# Patient Record
Sex: Female | Born: 1970
Health system: Southern US, Community
[De-identification: ages and names within clinical notes are randomized; demographics above are authoritative.]

## PROBLEM LIST (undated history)

## (undated) DIAGNOSIS — F329 Major depressive disorder, single episode, unspecified: Secondary | ICD-10-CM

## (undated) DIAGNOSIS — E119 Type 2 diabetes mellitus without complications: Secondary | ICD-10-CM

## (undated) DIAGNOSIS — E669 Obesity, unspecified: Secondary | ICD-10-CM

## (undated) DIAGNOSIS — M545 Low back pain, unspecified: Secondary | ICD-10-CM

## (undated) DIAGNOSIS — A64 Unspecified sexually transmitted disease: Secondary | ICD-10-CM

## (undated) DIAGNOSIS — K219 Gastro-esophageal reflux disease without esophagitis: Secondary | ICD-10-CM

## (undated) DIAGNOSIS — Z8619 Personal history of other infectious and parasitic diseases: Secondary | ICD-10-CM

## (undated) DIAGNOSIS — F32A Depression, unspecified: Secondary | ICD-10-CM

## (undated) DIAGNOSIS — E1169 Type 2 diabetes mellitus with other specified complication: Secondary | ICD-10-CM

## (undated) DIAGNOSIS — M1990 Unspecified osteoarthritis, unspecified site: Secondary | ICD-10-CM

## (undated) DIAGNOSIS — T7840XA Allergy, unspecified, initial encounter: Secondary | ICD-10-CM

## (undated) DIAGNOSIS — E785 Hyperlipidemia, unspecified: Secondary | ICD-10-CM

## (undated) DIAGNOSIS — F418 Other specified anxiety disorders: Secondary | ICD-10-CM

## (undated) DIAGNOSIS — G43909 Migraine, unspecified, not intractable, without status migrainosus: Secondary | ICD-10-CM

## (undated) DIAGNOSIS — Z124 Encounter for screening for malignant neoplasm of cervix: Secondary | ICD-10-CM

## (undated) DIAGNOSIS — Q2112 Patent foramen ovale: Secondary | ICD-10-CM

## (undated) HISTORY — DX: Gastro-esophageal reflux disease without esophagitis: K21.9

## (undated) HISTORY — DX: Other specified anxiety disorders: F41.8

## (undated) HISTORY — PX: AUGMENTATION MAMMAPLASTY: SUR837

## (undated) HISTORY — DX: Unspecified sexually transmitted disease: A64

## (undated) HISTORY — DX: Personal history of other infectious and parasitic diseases: Z86.19

## (undated) HISTORY — DX: Patent foramen ovale: Q21.12

## (undated) HISTORY — DX: Major depressive disorder, single episode, unspecified: F32.9

## (undated) HISTORY — DX: Migraine, unspecified, not intractable, without status migrainosus: G43.909

## (undated) HISTORY — DX: Encounter for screening for malignant neoplasm of cervix: Z12.4

## (undated) HISTORY — DX: Allergy, unspecified, initial encounter: T78.40XA

## (undated) HISTORY — DX: Low back pain, unspecified: M54.50

## (undated) HISTORY — DX: Type 2 diabetes mellitus with other specified complication: E66.9

## (undated) HISTORY — DX: Unspecified osteoarthritis, unspecified site: M19.90

## (undated) HISTORY — DX: Low back pain: M54.5

## (undated) HISTORY — DX: Type 2 diabetes mellitus without complications: E11.9

## (undated) HISTORY — DX: Type 2 diabetes mellitus with other specified complication: E11.69

## (undated) HISTORY — DX: Hyperlipidemia, unspecified: E78.5

## (undated) HISTORY — DX: Depression, unspecified: F32.A

## (undated) HISTORY — PX: COSMETIC SURGERY: SHX468

---

## 2003-12-10 HISTORY — PX: LAPAROSCOPIC GASTRIC BANDING: SHX1100

## 2010-12-09 HISTORY — PX: ABDOMINOPLASTY: SUR9

## 2010-12-09 HISTORY — PX: MASTOPEXY: SUR857

## 2012-12-09 HISTORY — PX: AUGMENTATION MAMMAPLASTY: SUR837

## 2013-02-08 LAB — HM COLONOSCOPY

## 2014-12-09 HISTORY — PX: BLADDER SUSPENSION: SHX72

## 2014-12-09 HISTORY — PX: INCONTINENCE SURGERY: SHX676

## 2016-09-17 ENCOUNTER — Emergency Department (HOSPITAL_COMMUNITY): Payer: Self-pay

## 2016-09-17 ENCOUNTER — Emergency Department (HOSPITAL_COMMUNITY)
Admission: EM | Admit: 2016-09-17 | Discharge: 2016-09-18 | Disposition: A | Discharge status: Home / Self Care | Payer: Self-pay | Attending: Emergency Medicine | Admitting: Emergency Medicine

## 2016-09-17 ENCOUNTER — Encounter (HOSPITAL_COMMUNITY): Payer: Self-pay | Admitting: *Deleted

## 2016-09-17 DIAGNOSIS — T63001A Toxic effect of unspecified snake venom, accidental (unintentional), initial encounter: Secondary | ICD-10-CM | POA: Insufficient documentation

## 2016-09-17 DIAGNOSIS — S91332A Puncture wound without foreign body, left foot, initial encounter: Secondary | ICD-10-CM

## 2016-09-17 DIAGNOSIS — E119 Type 2 diabetes mellitus without complications: Secondary | ICD-10-CM | POA: Insufficient documentation

## 2016-09-17 DIAGNOSIS — S91352A Open bite, left foot, initial encounter: Secondary | ICD-10-CM | POA: Diagnosis not present

## 2016-09-17 DIAGNOSIS — W5911XA Bitten by nonvenomous snake, initial encounter: Secondary | ICD-10-CM

## 2016-09-17 HISTORY — DX: Type 2 diabetes mellitus without complications: E11.9

## 2016-09-17 LAB — URINALYSIS, ROUTINE W REFLEX MICROSCOPIC
Bilirubin Urine: NEGATIVE
GLUCOSE, UA: NEGATIVE mg/dL
Ketones, ur: NEGATIVE mg/dL
NITRITE: NEGATIVE
PH: 5.5 (ref 5.0–8.0)
Protein, ur: NEGATIVE mg/dL
SPECIFIC GRAVITY, URINE: 1.011 (ref 1.005–1.030)

## 2016-09-17 LAB — CBC WITH DIFFERENTIAL/PLATELET
BASOS ABS: 0 10*3/uL (ref 0.0–0.1)
BASOS PCT: 0 %
Eosinophils Absolute: 0.2 10*3/uL (ref 0.0–0.7)
Eosinophils Relative: 2 %
HEMATOCRIT: 38.9 % (ref 36.0–46.0)
HEMOGLOBIN: 13.4 g/dL (ref 12.0–15.0)
LYMPHS PCT: 32 %
Lymphs Abs: 3.3 10*3/uL (ref 0.7–4.0)
MCH: 32.1 pg (ref 26.0–34.0)
MCHC: 34.4 g/dL (ref 30.0–36.0)
MCV: 93.3 fL (ref 78.0–100.0)
MONO ABS: 0.7 10*3/uL (ref 0.1–1.0)
Monocytes Relative: 7 %
NEUTROS ABS: 6 10*3/uL (ref 1.7–7.7)
NEUTROS PCT: 59 %
Platelets: 260 10*3/uL (ref 150–400)
RBC: 4.17 MIL/uL (ref 3.87–5.11)
RDW: 13.3 % (ref 11.5–15.5)
WBC: 10.1 10*3/uL (ref 4.0–10.5)

## 2016-09-17 LAB — BASIC METABOLIC PANEL
ANION GAP: 5 (ref 5–15)
BUN: 14 mg/dL (ref 6–20)
CO2: 26 mmol/L (ref 22–32)
Calcium: 9.5 mg/dL (ref 8.9–10.3)
Chloride: 107 mmol/L (ref 101–111)
Creatinine, Ser: 0.98 mg/dL (ref 0.44–1.00)
GLUCOSE: 112 mg/dL — AB (ref 65–99)
POTASSIUM: 3.9 mmol/L (ref 3.5–5.1)
Sodium: 138 mmol/L (ref 135–145)

## 2016-09-17 LAB — URINE MICROSCOPIC-ADD ON

## 2016-09-17 LAB — APTT: aPTT: 27 seconds (ref 24–36)

## 2016-09-17 LAB — PROTIME-INR
INR: 1.04
Prothrombin Time: 13.7 seconds (ref 11.4–15.2)

## 2016-09-17 LAB — FIBRINOGEN: FIBRINOGEN: 350 mg/dL (ref 210–475)

## 2016-09-17 LAB — CK: CK TOTAL: 131 U/L (ref 38–234)

## 2016-09-17 MED ORDER — IBUPROFEN 400 MG PO TABS
600.0000 mg | ORAL_TABLET | Freq: Once | ORAL | Status: AC
  Administered 2016-09-17: 600 mg via ORAL
  Filled 2016-09-17: qty 1

## 2016-09-17 NOTE — ED Provider Notes (Signed)
[facility]-EM[facility] DEPT Provider Note   CSN: [id number] Arrival date & time: 09/17/16  2008     History   Chief Complaint Chief Complaint  Patient presents with  . Puncture Wound    HPI Amanda James is a 45 y.o. female who presents with a reported snake bite. PMH significant for DM on Victoza. She states that she was stepping out of her car at ~7:30 PM this evening when she felt pain in her left foot. She states she looked down and saw a snake. She took a picture of it and thought it looked like a copperhead. She decided to come to the ED for further evaluation. Denies dizziness, chest pain, SOB, abdominal pain, N/V, myalgias, paresthesias. The pain on the foot is mild. She is ambulatory. UTD on tetanus.  HPI  Past Medical History:  Diagnosis Date  . Diabetes mellitus without complication (HCC)     There are no active problems to display for this patient.   History reviewed. No pertinent surgical history.  OB History    No data available       Home Medications    Prior to Admission medications   Not on File    Family History No family history on file.  Social History Social History  Substance Use Topics  . Smoking status: Never Smoker  . Smokeless tobacco: Never Used  . Alcohol use Yes     Allergies   Erythromycin   Review of Systems Review of Systems  Respiratory: Negative for shortness of breath.   Cardiovascular: Negative for chest pain.  Gastrointestinal: Negative for abdominal pain, nausea and vomiting.  Musculoskeletal: Negative for myalgias.  Skin: Positive for wound.  Neurological: Negative for numbness.  All other systems reviewed and are negative.    Physical Exam Updated Vital Signs BP 145/95 (BP Location: Right Arm)   Pulse 96   Temp 99.7 F (37.6 C) (Oral)   Resp 18   Ht 5\' 6"  (1.676 m)   Wt 94.4 kg   SpO2 96%   BMI 33.58 kg/m   Physical Exam  Constitutional: She is oriented to person, place, and time. She appears  well-developed and well-nourished. No distress.  HENT:  Head: Normocephalic and atraumatic.  Eyes: Conjunctivae are normal. Pupils are equal, round, and reactive to light. Right eye exhibits no discharge. Left eye exhibits no discharge. No scleral icterus.  Neck: Normal range of motion. Neck supple.  Cardiovascular: Normal rate and regular rhythm.  Exam reveals no gallop and no friction rub.   No murmur heard. Pulmonary/Chest: Effort normal and breath sounds normal. No respiratory distress. She has no wheezes. She has no rales. She exhibits no tenderness.  Abdominal: Soft. She exhibits no distension. There is no tenderness.  Musculoskeletal: She exhibits no edema.  Neurological: She is alert and oriented to person, place, and time.  Skin: Skin is warm and dry.  One small pinpoint bite mark on top of left foot with mild redness. No swelling  Psychiatric: She has a normal mood and affect. Her behavior is normal.  Nursing note and vitals reviewed.    ED Treatments / Results  Labs (all labs ordered are listed, but only abnormal results are displayed) Labs Reviewed  BASIC METABOLIC PANEL - Abnormal; Notable for the following:       Result Value   Glucose, Bld 112 (*)    All other components within normal limits  URINALYSIS, ROUTINE W REFLEX MICROSCOPIC (NOT AT [facility]) - Abnormal; Notable for the following:  Hgb urine dipstick TRACE (*)    Leukocytes, UA MODERATE (*)    All other components within normal limits  URINE MICROSCOPIC-ADD ON - Abnormal; Notable for the following:    Squamous Epithelial / LPF 0-5 (*)    Bacteria, UA FEW (*)    All other components within normal limits  CBC WITH DIFFERENTIAL/PLATELET  PROTIME-INR  FIBRINOGEN  CK  APTT    EKG  EKG Interpretation None       Radiology No results found.  Procedures Procedures (including critical care time)  Medications Ordered in ED Medications - No data to display   Initial Impression / Assessment and Plan /  ED Course  I have reviewed the triage vital signs and the nursing notes.  Pertinent labs & imaging results that were available during my care of the patient were reviewed by me and considered in my medical decision making (see chart for details).  Clinical Course   45 year old female presents with snake bite. Patient is afebrile, not tachycardic or tachypneic, and not hypoxic. Hypertensive. She is in NAD, ambulatory, well-appearing. Labs are unremarkable. Ibuprofen given for pain. Recommend no tourniquet or ice. Keep foot elevated.  Poison control consulted who recommends observation for 6 hours. Patient signed out to [facility].  Final Clinical Impressions(s) / ED Diagnoses   Final diagnoses:  Snake bite, initial encounter  Puncture wound of left foot, initial encounter    New Prescriptions New Prescriptions   No medications on file     Bethel BornKelly Marie Clarity Ciszek, PA-C 09/17/16 2242    Mancel BaleElliott Wentz, MD 09/19/16 1255

## 2016-09-17 NOTE — ED Triage Notes (Signed)
The pt is c/o of being bitten by a snake  To the lt foot just below the great toe one puncture wound no active bleeding  Minimal pain  She took a picture of it  With her  No swelling

## 2016-09-18 NOTE — ED Provider Notes (Signed)
THIS IS A SHARED VISIT WITH KELLY GEKAS, PAC.  Amanda James is a 45 y.o. female who is here tonight after being bit by a snake. She has been evaluated and per poison control need to be observed for 6 hours. She will be ready for d/c at 1:30 am if she continues to have no problems.  BP 145/95 (BP Location: Right Arm)   Pulse 96   Temp 99.7 F (37.6 C) (Oral)   Resp 18   Ht 5\' 6"  (1.676 m)   Wt 94.4 kg   LMP 09/09/2016   SpO2 96%   BMI 33.58 kg/m    Patient resting without problems.   @ 0130 patient reports feeling fine and ready to go home. I discussed lab results and f/u. Patient will return for any problems.    [address] [place] [address] [place] [name], NP 09/18/16 0132    Mancel BaleElliott Wentz, MD 09/19/16 1255

## 2016-10-01 ENCOUNTER — Ambulatory Visit: Payer: Self-pay | Admitting: Dietician

## 2016-10-04 ENCOUNTER — Encounter: Payer: Self-pay | Admitting: *Deleted

## 2016-10-04 ENCOUNTER — Other Ambulatory Visit: Payer: Self-pay | Admitting: *Deleted

## 2016-10-04 VITALS — BP 100/78 | Ht 66.0 in | Wt 206.4 lb

## 2016-10-04 DIAGNOSIS — E669 Obesity, unspecified: Secondary | ICD-10-CM

## 2016-10-04 DIAGNOSIS — E119 Type 2 diabetes mellitus without complications: Secondary | ICD-10-CM

## 2016-10-04 DIAGNOSIS — Z6833 Body mass index (BMI) 33.0-33.9, adult: Principal | ICD-10-CM

## 2016-10-04 NOTE — Patient Outreach (Addendum)
[facility] Care Management   10/04/2016  Amanda James 12-28-1970 [id number]  Amanda James is an 45 y.o. female who [facility] the [facility] office to enroll in the Link To Wellness program for self management assistance with Type II DM and hyperlipidemia.  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 [place], she had practiced in [place] previously.  She says she will be practicing in [place]ric [place] [facility] [place] [facility] [place] and will start seeing patients in December. She says she was diagnosed with Type II DM in 2003 and hyperlipidemia in 2014. She says she was taken off Metformin because her Hgb A1C was 4.9% when she was taking both Metformin and Victoza. She reports her last Hgb A1C done in June 2017 was 5.9% and she likes to keep her Hgb A1C around 5.5%. Says she has not required an endocrinologist to assist with diabetes management.   Objective:   Review of Systems  Constitutional: Negative.     Physical Exam  Constitutional: She is oriented to person, place, and time. She appears well-developed and well-nourished.  Respiratory: Effort normal.  Neurological: She is alert and oriented to person, place, and time.  Skin: Skin is warm and dry.  Psychiatric: She has a normal mood and affect. Her behavior is normal. Judgment and thought content normal.   Vitals:   10/04/16 0844  BP: 100/78   Filed Weights   10/04/16 0844  Weight: 206 lb 6.4 oz (93.6 kg)    Encounter Medications:   Outpatient Encounter Prescriptions as of 10/04/2016  Medication Sig  . atorvastatin (LIPITOR) 10 MG tablet Take 10 mg by mouth daily.  . Cetirizine HCl (ZYRTEC ALLERGY) 10 MG CAPS Take 1 capsule by mouth daily.  [name]. escitalopram (LEXAPRO) 20 MG tablet Take 20 mg by mouth daily.  .  fluticasone (FLONASE) 50 MCG/ACT nasal spray Place 1 spray into both nostrils 2 (two) times daily.  [name]. liraglutide (VICTOZA) 18 MG/3ML SOPN Inject 1.8 mg into the skin daily.  . Naltrexone-Bupropion HCl ER (CONTRAVE) 8-90 MG TB12 Take 1 tablet by mouth daily.   No facility-administered encounter medications on file as of 10/04/2016.     Functional Status:   In your present state of health, do you have any difficulty performing the following activities: 10/04/2016  Hearing? N  Vision? N  Walking or climbing stairs? N  Dressing or bathing? N  Preparing Food and eating ? N  Using the Toilet? N  In the past six months, have you accidently leaked urine? N  Do you have problems with loss of bowel control? N  Managing your Medications? N  Managing your Finances? N  Housekeeping or managing your Housekeeping? N    Fall/Depression Screening:    PHQ 2/9 Scores 10/04/2016  PHQ - 2 Score 0    Assessment:  [employer] employee, a physician, with Type II DM and hyperlipidemia enrolling in the Link TO Wellness program for self management assistance with chronic disease states.  Plan: [facility] CM Care Plan Problem One   Flowsheet Row Most Recent Value  Care Plan Problem One  New employee with Type II MD and hyperlipidemia enrolling in the Link To Wellness program for self management assistance for chronic disease states   Role Documenting the Problem One  Care Management Coordinator  Care Plan for Problem One  Active  [facility] Long Term Goal (31-90 days)  Ongoing good control of Type II DM and hyperlipidemia as evidenced by meeting treatment targets for lipid and diabetes,  normal lipid profile and Hgb A1C< or = to 5.5%  [facility] Long Term Goal Start Date  10/04/16  Interventions for Problem One Long Term Goal  Discussed Link to Wellness program goals, requirements and benefits, reviewed member's rights and responsibilities ,provided diabetes information packet with explanation of contents, ensured member  agreed and signed consent to participate and authorization to release and receive health information, consent, participation agreement and consent to enroll in program, assessed member's current knowledge of Type II diabetes, reviewed patient's medications and assessed medication adherence, discussed DM medication of Victoza,reinforced importance of taking all medications as prescribed, provided education on the three primary macronutrients (CHO, protein, fat) and their effect on glucose levels, reviewed the plate method to assist with adherence to a CHO controlled meal plan,reviewed approximate amount of CHOs to aim for at meals ( 30-45 gm ) and snacks (15 gms), discussed nutritional counseling benefit provided by [facility] health plan and encouraged patient to keep her appointment with the dietician on 10/11/16 to assist with dietary management of diabetes,discussed effects of physical activity on glucose levels and long-term glucose control by improving insulin sensitivity and assisting with weight management and cardiovascular health, encouraged patient to continue to exercise,  reviewed American Diabetes Association recommendations of 150 minutes of exercise per week including two sessions of resistance exercise weekly, discussed exercise opportunities offered for free by [facility] ,discussed benefits of choosing a [facility] primary care provider and provided web address so that she can choose a participating primary care provider, educated her to [facility] website and the [employer], advised that follow up for the Link To Wellness program will be provided by [employer] and gave patient brochure and requested she enroll. Provided her with this RNCM's contact information should she need it in the future.      Bary Richard RN,CCM,CDE [facility] Care Management Coordinator Link To Wellness Office Phone [phone] Office Fax [phone]

## 2016-10-10 NOTE — Addendum Note (Signed)
Addended by: Bary RichardHAUSER, Serinity Ware S on: 10/10/2016 05:02 PM   Modules accepted: Orders

## 2016-10-10 NOTE — Addendum Note (Signed)
Addended by: Bary RichardHAUSER, JANET S on: 10/10/2016 04:48 PM   Modules accepted: Orders

## 2016-10-11 ENCOUNTER — Encounter: Payer: Self-pay | Admitting: Skilled Nursing Facility1

## 2016-10-11 ENCOUNTER — Encounter: Payer: 59 | Attending: "Endocrinology | Admitting: Skilled Nursing Facility1

## 2016-10-11 DIAGNOSIS — Z713 Dietary counseling and surveillance: Secondary | ICD-10-CM | POA: Insufficient documentation

## 2016-10-11 NOTE — Progress Notes (Signed)
Diabetes Self-Management Education  Visit Type: First/Initial  Appt. Start Time: 8:00 Appt. End Time: 8:30  10/11/2016  [name], identified by name and date of birth, is a 45 y.o. female with a diagnosis of Diabetes: Type 2.   ASSESSMENT  Height 5\' 6"  (1.676 m), last menstrual period 09/15/2016. There is no height or weight on file to calculate BMI.      Diabetes Self-Management Education - 10/11/16 0800      Visit Information   Visit Type First/Initial     Initial Visit   Diabetes Type Type 2   Are you currently following a meal plan? Yes   Are you taking your medications as prescribed? Yes   Date Diagnosed 2001     Health Coping   How would you rate your overall health? Good     Psychosocial Assessment   Patient Belief/Attitude about Diabetes Motivated to manage diabetes     Pre-Education Assessment   Patient understands the diabetes disease and treatment process. Demonstrates understanding / competency   Patient understands incorporating nutritional management into lifestyle. Demonstrates understanding / competency   Patient undertands incorporating physical activity into lifestyle. Demonstrates understanding / competency   Patient understands using medications safely. Demonstrates understanding / competency   Patient understands monitoring blood glucose, interpreting and using results Demonstrates understanding / competency   Patient understands prevention, detection, and treatment of acute complications. Demonstrates understanding / competency   Patient understands prevention, detection, and treatment of chronic complications. Demonstrates understanding / competency   Patient understands how to develop strategies to address psychosocial issues. Demonstrates understanding / competency   Patient understands how to develop strategies to promote health/change behavior. Demonstrates understanding / competency     Complications   Last HgB A1C per patient/outside  source 5.9 %   How often do you check your blood sugar? --  1-2 times a week   Fasting Blood glucose range (mg/dL) [phone]   Have you had a dilated eye exam in the past 12 months? No   Have you had a dental exam in the past 12 months? Yes   Are you checking your feet? Yes   How many days per week are you checking your feet? 5     Exercise   Exercise Type Light (walking / raking leaves)   How many days per week to you exercise? 3   How many minutes per day do you exercise? 45   Total minutes per week of exercise 135     Patient Education   Previous Diabetes Education Yes (please comment)     Post-Education Assessment   Patient understands the diabetes disease and treatment process. Demonstrates understanding / competency   Patient understands incorporating nutritional management into lifestyle. Demonstrates understanding / competency   Patient undertands incorporating physical activity into lifestyle. Demonstrates understanding / competency   Patient understands using medications safely. Demonstrates understanding / competency   Patient understands monitoring blood glucose, interpreting and using results Demonstrates understanding / competency   Patient understands prevention, detection, and treatment of acute complications. Demonstrates understanding / competency   Patient understands prevention, detection, and treatment of chronic complications. Demonstrates understanding / competency   Patient understands how to develop strategies to address psychosocial issues. Demonstrates understanding / competency   Patient understands how to develop strategies to promote health/change behavior. Demonstrates understanding / competency     Outcomes   Expected Outcomes Demonstrated interest in learning. Expect positive outcomes   Future DMSE PRN   Program Status Completed  Individualized Plan for Diabetes Self-Management Training:   Learning Objective:  Patient will have a greater  understanding of diabetes self-management. Patient education plan is to attend individual and/or group sessions per assessed needs and concerns.   Plan:   There are no Patient Instructions on file for this visit.  Expected Outcomes:  Demonstrated interest in learning. Expect positive outcomes   If problems or questions, patient to contact team via:  Phone  Future DSME appointment: PRN

## 2016-10-16 ENCOUNTER — Telehealth: Payer: Self-pay | Admitting: *Deleted

## 2016-10-16 NOTE — Telephone Encounter (Signed)
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 I wanted to know if you wanted me to try to fit her in some where (lol) she left her number for you to call her [phone] but I will take care of it for you if you let me know what to do

## 2016-10-24 ENCOUNTER — Telehealth: Payer: Self-pay

## 2016-10-24 NOTE — Telephone Encounter (Signed)
Left message for patient to return call regarding pre- visit information.  

## 2016-10-25 ENCOUNTER — Encounter: Payer: Self-pay | Admitting: Family Medicine

## 2016-10-25 ENCOUNTER — Ambulatory Visit: Payer: 59 | Admitting: Family Medicine

## 2016-10-25 ENCOUNTER — Ambulatory Visit (INDEPENDENT_AMBULATORY_CARE_PROVIDER_SITE_OTHER): Payer: 59 | Admitting: Family Medicine

## 2016-10-25 VITALS — BP 104/68 | HR 79 | Temp 98.2°F | Ht 66.0 in | Wt 209.0 lb

## 2016-10-25 DIAGNOSIS — E785 Hyperlipidemia, unspecified: Secondary | ICD-10-CM | POA: Insufficient documentation

## 2016-10-25 DIAGNOSIS — F419 Anxiety disorder, unspecified: Secondary | ICD-10-CM | POA: Insufficient documentation

## 2016-10-25 DIAGNOSIS — F418 Other specified anxiety disorders: Secondary | ICD-10-CM

## 2016-10-25 DIAGNOSIS — Z1231 Encounter for screening mammogram for malignant neoplasm of breast: Secondary | ICD-10-CM

## 2016-10-25 DIAGNOSIS — E669 Obesity, unspecified: Secondary | ICD-10-CM | POA: Insufficient documentation

## 2016-10-25 DIAGNOSIS — T7840XA Allergy, unspecified, initial encounter: Secondary | ICD-10-CM | POA: Diagnosis not present

## 2016-10-25 DIAGNOSIS — E6609 Other obesity due to excess calories: Secondary | ICD-10-CM

## 2016-10-25 DIAGNOSIS — Z0000 Encounter for general adult medical examination without abnormal findings: Secondary | ICD-10-CM

## 2016-10-25 DIAGNOSIS — E119 Type 2 diabetes mellitus without complications: Secondary | ICD-10-CM

## 2016-10-25 DIAGNOSIS — E782 Mixed hyperlipidemia: Secondary | ICD-10-CM

## 2016-10-25 DIAGNOSIS — E559 Vitamin D deficiency, unspecified: Secondary | ICD-10-CM

## 2016-10-25 DIAGNOSIS — F32A Depression, unspecified: Secondary | ICD-10-CM | POA: Insufficient documentation

## 2016-10-25 DIAGNOSIS — Z8619 Personal history of other infectious and parasitic diseases: Secondary | ICD-10-CM | POA: Insufficient documentation

## 2016-10-25 DIAGNOSIS — E1169 Type 2 diabetes mellitus with other specified complication: Secondary | ICD-10-CM | POA: Insufficient documentation

## 2016-10-25 DIAGNOSIS — Z1239 Encounter for other screening for malignant neoplasm of breast: Secondary | ICD-10-CM

## 2016-10-25 LAB — HEMOGLOBIN A1C
HEMOGLOBIN A1C: 5 % (ref <5.7)
Mean Plasma Glucose: 97 mg/dL

## 2016-10-25 LAB — LIPID PANEL
CHOL/HDL RATIO: 2.5 ratio (ref <5.0)
Cholesterol: 141 mg/dL (ref <200)
HDL: 56 mg/dL (ref >50)
LDL Cholesterol: 65 mg/dL (ref <100)
TRIGLYCERIDES: 99 mg/dL (ref <150)
VLDL: 20 mg/dL (ref <30)

## 2016-10-25 LAB — TSH: TSH: 3.51 m[IU]/L

## 2016-10-25 NOTE — Progress Notes (Signed)
Pre visit review using our clinic review tool, if applicable. No additional management support is needed unless otherwise documented below in the visit note. 

## 2016-10-25 NOTE — Patient Instructions (Signed)
Carbohydrate Counting for Diabetes Mellitus, Adult Carbohydrate counting is a method for keeping track of how many carbohydrates you eat. Eating carbohydrates naturally increases the amount of sugar (glucose) in the blood. Counting how many carbohydrates you eat helps keep your blood glucose within normal limits, which helps you manage your diabetes (diabetes mellitus). It is important to know how many carbohydrates you can safely have in each meal. This is different for every person. A diet and nutrition specialist (registered dietitian) can help you make a meal plan and calculate how many carbohydrates you should have at each meal and snack. Carbohydrates are found in the following foods:  Grains, such as breads and cereals.  Dried beans and soy products.  Starchy vegetables, such as potatoes, peas, and corn.  Fruit and fruit juices.  Milk and yogurt.  Sweets and snack foods, such as cake, cookies, candy, chips, and soft drinks. How do I count carbohydrates? There are two ways to count carbohydrates in food. You can use either of the methods or a combination of both. Reading "Nutrition Facts" on packaged food  The "Nutrition Facts" list is included on the labels of almost all packaged foods and beverages in the [place] It includes:  The serving size.  Information about nutrients in each serving, including the grams (g) of carbohydrate per serving. To use the "Nutrition Facts":  Decide how many servings you will have.  Multiply the number of servings by the number of carbohydrates per serving.  The resulting number is the total amount of carbohydrates that you will be having. Learning standard serving sizes of other foods  When you eat foods containing carbohydrates that are not packaged or do not include "Nutrition Facts" on the label, you need to measure the servings in order to count the amount of carbohydrates:  Measure the foods that you will eat with a food scale or measuring  cup, if needed.  Decide how many standard-size servings you will eat.  Multiply the number of servings by 15. Most carbohydrate-rich foods have about 15 g of carbohydrates per serving.  For example, if you eat 8 oz (170 g) of strawberries, you will have eaten 2 servings and 30 g of carbohydrates (2 servings x 15 g = 30 g).  For foods that have more than one food mixed, such as soups and casseroles, you must count the carbohydrates in each food that is included. The following list contains standard serving sizes of common carbohydrate-rich foods. Each of these servings has about 15 g of carbohydrates:   hamburger bun or  English muffin.   oz (15 mL) syrup.   oz (14 g) jelly.  1 slice of bread.  1 six-inch tortilla.  3 oz (85 g) cooked rice or pasta.  4 oz (113 g) cooked dried beans.  4 oz (113 g) starchy vegetable, such as peas, corn, or potatoes.  4 oz (113 g) hot cereal.  4 oz (113 g) mashed potatoes or  of a large baked potato.  4 oz (113 g) canned or frozen fruit.  4 oz (120 mL) fruit juice.  4-6 crackers.  6 chicken nuggets.  6 oz (170 g) unsweetened dry cereal.  6 oz (170 g) plain fat-free yogurt or yogurt sweetened with artificial sweeteners.  8 oz (240 mL) milk.  8 oz (170 g) fresh fruit or one small piece of fruit.  24 oz (680 g) popped popcorn. Example of carbohydrate counting Sample meal  3 oz (85 g) chicken breast.  6 oz (  170 g) brown rice.  4 oz (113 g) corn.  8 oz (240 mL) milk.  8 oz (170 g) strawberries with sugar-free whipped topping. Carbohydrate calculation 1. Identify the foods that contain carbohydrates:  Rice.  Corn.  Milk.  Strawberries. 2. Calculate how many servings you have of each food:  2 servings rice.  1 serving corn.  1 serving milk.  1 serving strawberries. 3. Multiply each number of servings by 15 g:  2 servings rice x 15 g = 30 g.  1 serving corn x 15 g = 15 g.  1 serving milk x 15 g = 15  g.  1 serving strawberries x 15 g = 15 g. 4. Add together all of the amounts to find the total grams of carbohydrates eaten:  30 g + 15 g + 15 g + 15 g = 75 g of carbohydrates total. This information is not intended to replace advice given to you by your health care provider. Make sure you discuss any questions you have with your health care provider. Document Released: 11/25/2005 Document Revised: 06/14/2016 Document Reviewed: 05/08/2016 Elsevier Interactive Patient Education  2017 Elsevier Inc.  

## 2016-10-26 LAB — VITAMIN D 25 HYDROXY (VIT D DEFICIENCY, FRACTURES): Vit D, 25-Hydroxy: 43 ng/mL (ref 30–100)

## 2016-10-28 ENCOUNTER — Encounter: Payer: Self-pay | Admitting: Family Medicine

## 2016-10-28 ENCOUNTER — Other Ambulatory Visit: Payer: Self-pay | Admitting: Family Medicine

## 2016-10-28 MED ORDER — LIRAGLUTIDE 18 MG/3ML ~~LOC~~ SOPN: 1.8000 mg | PEN_INJECTOR | Freq: Every day | SUBCUTANEOUS | 6 refills | Status: DC

## 2016-10-28 MED ORDER — ATORVASTATIN CALCIUM 10 MG PO TABS: 10.0000 mg | ORAL_TABLET | Freq: Every day | ORAL | 3 refills | Status: DC

## 2016-10-28 MED ORDER — ESCITALOPRAM OXALATE 20 MG PO TABS: 20.0000 mg | ORAL_TABLET | Freq: Every day | ORAL | 3 refills | Status: DC

## 2016-10-28 MED ORDER — NALTREXONE-BUPROPION HCL ER 8-90 MG PO TB12: ORAL_TABLET | ORAL | 6 refills | Status: DC

## 2016-10-28 MED ORDER — NALTREXONE-BUPROPION HCL ER 8-90 MG PO TB12: 1.0000 | ORAL_TABLET | Freq: Every day | ORAL | 6 refills | Status: DC

## 2016-10-28 MED FILL — ATORVASTATIN 10 MG TABLET: 10 | 90 days supply | Qty: 90 | Fill #0

## 2016-10-28 MED FILL — VICTOZA 18 MG/3 ML INJECT P: 18 | 30 days supply | Qty: 9 | Fill #0

## 2016-10-28 MED FILL — CONTRAVE ER 8-90 MG TABLET: 8-90 | 30 days supply | Qty: 120 | Fill #0

## 2016-10-29 ENCOUNTER — Other Ambulatory Visit: Payer: Self-pay | Admitting: Family Medicine

## 2016-10-29 ENCOUNTER — Telehealth: Payer: Self-pay | Admitting: Family Medicine

## 2016-10-29 ENCOUNTER — Ambulatory Visit (HOSPITAL_BASED_OUTPATIENT_CLINIC_OR_DEPARTMENT_OTHER)
Admission: RE | Admit: 2016-10-29 | Discharge: 2016-10-29 | Disposition: A | Discharge status: Home / Self Care | Payer: 59 | Source: Ambulatory Visit | Attending: Family Medicine | Admitting: Family Medicine

## 2016-10-29 DIAGNOSIS — Z1239 Encounter for other screening for malignant neoplasm of breast: Secondary | ICD-10-CM

## 2016-10-29 DIAGNOSIS — Z1231 Encounter for screening mammogram for malignant neoplasm of breast: Secondary | ICD-10-CM | POA: Insufficient documentation

## 2016-10-29 NOTE — Telephone Encounter (Signed)
PA for Contrave has been approved from 10/28/2016 through 10/28/2017 Pharmacy and patient have been informed of approval. Approval letter has been sent to scan.

## 2016-11-10 ENCOUNTER — Encounter: Payer: Self-pay | Admitting: Family Medicine

## 2016-11-10 DIAGNOSIS — E669 Obesity, unspecified: Secondary | ICD-10-CM

## 2016-11-10 DIAGNOSIS — E559 Vitamin D deficiency, unspecified: Secondary | ICD-10-CM | POA: Insufficient documentation

## 2016-11-10 DIAGNOSIS — Z0000 Encounter for general adult medical examination without abnormal findings: Secondary | ICD-10-CM | POA: Insufficient documentation

## 2016-11-10 HISTORY — DX: Obesity, unspecified: E66.9

## 2016-11-10 NOTE — Assessment & Plan Note (Signed)
WNL on labs, continue supplements.

## 2016-11-10 NOTE — Assessment & Plan Note (Signed)
S/p gastric bypass and doing well. Needs heart healthy diet, monitor po intake and increase exercise as tolerated. Needs 7-8 hours of sleep nightly..[name]

## 2016-11-10 NOTE — Assessment & Plan Note (Signed)
hgba1c acceptable, minimize simple carbs. Increase exercise as tolerated. Continue current meds 

## 2016-11-10 NOTE — Progress Notes (Signed)
Patient ID: Amanda James, female   DOB: 12/27/1970, 45 y.o.   MRN: [id number]   Subjective:    Patient ID: Amanda James, female    DOB: 1971/06/13, 45 y.o.   MRN: [id number]  Chief Complaint  Patient presents with  . Establish Care    HPI Patient is in today for new patient appointment and doing well. She has a PMH of DM 2, hyperlipidemia, obesity, allergies, depression and Vitamin D deficiency. She is due for some lab work. No recent acute illness or hospitalization. Denies CP/palp/SOB/HA/congestion/fevers/GI or GU c/o. Taking meds as prescribed. No polyuria or polydipsia.   Past Medical History:  Diagnosis Date  . Allergy   . Depression   . Depression with anxiety   . Diabetes mellitus type 2 in obese ([place])   . Diabetes mellitus without complication ([place])   . History of chicken pox   . Hyperlipidemia   . Obesity 11/10/2016    Past Surgical History:  Procedure Laterality Date  . ABDOMINOPLASTY  2012  . BLADDER SUSPENSION  2016  . LAPAROSCOPIC GASTRIC BANDING  2005   esophageal dilation. band still present but released  . MASTOPEXY  2012    Family History  Problem Relation Age of Onset  . Diabetes Mother   . Hypertension Mother   . Hyperlipidemia Mother   . Obesity Mother   . Diabetes Father   . Heart disease Father     MI  . Hyperlipidemia Father   . Hypertension Father   . Obesity Father   . Diabetes Sister   . Hyperlipidemia Sister   . Hypertension Sister   . Obesity Sister   . Obesity Maternal Grandmother   . Hypertension Maternal Grandmother   . Hyperlipidemia Maternal Grandmother   . Diabetes Maternal Grandmother   . Rosacea Maternal Grandmother   . Non-Hodgkin's lymphoma Maternal Grandmother   . Arthritis Maternal Grandmother     spinal stenosis  . Multiple myeloma Maternal Grandfather   . Hyperlipidemia Paternal Grandmother     rhabdo from statins  . Kidney disease Paternal Grandmother     Social History   Social History  . Marital status:  Married    Spouse name: N/A  . Number of children: N/A  . Years of education: N/A   Occupational History  . Not on file.   Social History Main Topics  . Smoking status: Never Smoker  . Smokeless tobacco: Never Used  . Alcohol use 3.0 oz/week    5 Glasses of wine per week     Comment: 3-5 glasses of wine per week  . Drug use: No  . Sexual activity: Not on file   Other Topics Concern  . Not on file   Social History Narrative   Works With [employer], lives with husband, no major dietary restrictions. Exercise 30 minutes 3 x a week   Seat belts routinely    Outpatient Medications Prior to Visit  Medication Sig Dispense Refill  . Cetirizine HCl (ZYRTEC ALLERGY) 10 MG CAPS Take 1 capsule by mouth daily.    . fluticasone (FLONASE) 50 MCG/ACT nasal spray Place 1 spray into both nostrils 2 (two) times daily.    [name] atorvastatin (LIPITOR) 10 MG tablet Take 10 mg by mouth daily.    [name] escitalopram (LEXAPRO) 20 MG tablet Take 20 mg by mouth daily.    [name] liraglutide (VICTOZA) 18 MG/3ML SOPN Inject 1.8 mg into the skin daily. GI upset with metformin, had hypoglycemia with glipizide, and was not well controlled  on Januvia or Bydureon)    . Naltrexone-Bupropion HCl ER (CONTRAVE) 8-90 MG TB12 Take 1 tablet by mouth daily.     No facility-administered medications prior to visit.     Allergies  Allergen Reactions  . Erythromycin Diarrhea    Review of Systems  Constitutional: Negative for chills, fever and malaise/fatigue.  HENT: Negative for congestion and hearing loss.   Eyes: Negative for discharge.  Respiratory: Negative for cough, sputum production and shortness of breath.   Cardiovascular: Negative for chest pain, palpitations and leg swelling.  Gastrointestinal: Negative for abdominal pain, blood in stool, constipation, diarrhea, heartburn, nausea and vomiting.  Genitourinary: Negative for dysuria, frequency, hematuria and urgency.  Musculoskeletal: Negative for back pain, falls and  myalgias.  Skin: Negative for rash.  Neurological: Negative for dizziness, sensory change, loss of consciousness, weakness and headaches.  Endo/Heme/Allergies: Negative for environmental allergies. Does not bruise/bleed easily.  Psychiatric/Behavioral: Negative for depression and suicidal ideas. The patient is not nervous/anxious and does not have insomnia.        Objective:    Physical Exam  Constitutional: She is oriented to person, place, and time. She appears well-developed and well-nourished. No distress.  HENT:  Head: Normocephalic and atraumatic.  Eyes: Conjunctivae are normal.  Neck: Neck supple. No thyromegaly present.  Cardiovascular: Normal rate, regular rhythm and normal heart sounds.   No murmur heard. Pulmonary/Chest: Effort normal and breath sounds normal. No respiratory distress.  Abdominal: Soft. Bowel sounds are normal. She exhibits no distension and no mass. There is no tenderness.  Musculoskeletal: She exhibits no edema.  Lymphadenopathy:    She has no cervical adenopathy.  Neurological: She is alert and oriented to person, place, and time.  Skin: Skin is warm and dry.  Psychiatric: She has a normal mood and affect. Her behavior is normal.    BP 104/68 (BP Location: Left Arm, Patient Position: Sitting, Cuff Size: Large)   Pulse 79   Temp 98.2 F (36.8 C) (Oral)   Ht '5\' 6"'$  (1.676 m)   Wt 209 lb (94.8 kg)   LMP 09/15/2016   SpO2 98%   BMI 33.73 kg/m  Wt Readings from Last 3 Encounters:  10/25/16 209 lb (94.8 kg)  10/04/16 206 lb 6.4 oz (93.6 kg)  09/17/16 208 lb 1 oz (94.4 kg)     Lab Results  Component Value Date   WBC 10.1 09/17/2016   HGB 13.4 09/17/2016   HCT 38.9 09/17/2016   PLT 260 09/17/2016   GLUCOSE 112 (H) 09/17/2016   CHOL 141 10/25/2016   TRIG 99 10/25/2016   HDL 56 10/25/2016   LDLCALC 65 10/25/2016   NA 138 09/17/2016   K 3.9 09/17/2016   CL 107 09/17/2016   CREATININE 0.98 09/17/2016   BUN 14 09/17/2016   CO2 26 09/17/2016    TSH 3.51 10/25/2016   INR 1.04 09/17/2016   HGBA1C 5.0 10/25/2016    Lab Results  Component Value Date   TSH 3.51 10/25/2016   Lab Results  Component Value Date   WBC 10.1 09/17/2016   HGB 13.4 09/17/2016   HCT 38.9 09/17/2016   MCV 93.3 09/17/2016   PLT 260 09/17/2016   Lab Results  Component Value Date   NA 138 09/17/2016   K 3.9 09/17/2016   CO2 26 09/17/2016   GLUCOSE 112 (H) 09/17/2016   BUN 14 09/17/2016   CREATININE 0.98 09/17/2016   CALCIUM 9.5 09/17/2016   ANIONGAP 5 09/17/2016   Lab Results  Component  Value Date   CHOL 141 10/25/2016   Lab Results  Component Value Date   HDL 56 10/25/2016   Lab Results  Component Value Date   LDLCALC 65 10/25/2016   Lab Results  Component Value Date   TRIG 99 10/25/2016   Lab Results  Component Value Date   CHOLHDL 2.5 10/25/2016   Lab Results  Component Value Date   HGBA1C 5.0 10/25/2016       Assessment & Plan:   Problem List Items Addressed This Visit    History of chicken pox   Allergy   Depression with anxiety    Doing well on current meds. No changes      Diabetes mellitus type 2 in obese ([facility])    hgba1c acceptable, minimize simple carbs. Increase exercise as tolerated. Continue current meds      Hyperlipidemia - Primary    Tolerating statin, encouraged heart healthy diet, avoid trans fats, minimize simple carbs and saturated fats. Increase exercise as tolerated      Relevant Orders   TSH (Completed)   Lipid panel (Completed)   Obesity    S/p gastric bypass and doing well. Needs heart healthy diet, monitor po intake and increase exercise as tolerated. Needs 7-8 hours of sleep nightly..      Vitamin D deficiency    WNL on labs, continue supplements.       Relevant Orders   VITAMIN D 25 Hydroxy (Vit-D Deficiency, Fractures) (Completed)   Preventative health care    Other Visit Diagnoses    Breast cancer screening          I am having Ms. Mittleman maintain her fluticasone and  Cetirizine HCl.  No orders of the defined types were placed in this encounter.    Penni Homans, MD

## 2016-11-10 NOTE — Assessment & Plan Note (Signed)
Tolerating statin, encouraged heart healthy diet, avoid trans fats, minimize simple carbs and saturated fats. Increase exercise as tolerated 

## 2016-11-10 NOTE — Assessment & Plan Note (Signed)
Doing well on current meds. No changes  

## 2016-11-21 MED FILL — CONTRAVE ER 8-90 MG TABLET: 8-90 | 30 days supply | Qty: 120 | Fill #1

## 2016-11-21 MED FILL — ESCITALOPRAM 20 MG TABLET: 20 | 90 days supply | Qty: 90 | Fill #0

## 2016-11-21 MED FILL — VICTOZA 18 MG/3 ML INJECT P: 18 | 30 days supply | Qty: 9 | Fill #1

## 2017-02-19 MED FILL — ESCITALOPRAM 20 MG TABLET: 20 | 90 days supply | Qty: 90 | Fill #1 | Status: TO

## 2017-02-19 MED FILL — ATORVASTATIN 10 MG TABLET: 10 | 90 days supply | Qty: 90 | Fill #1 | Status: TO

## 2017-02-19 MED FILL — VICTOZA 18 MG/3 ML INJECT P: 18 | 30 days supply | Qty: 9 | Fill #2

## 2017-02-19 MED FILL — CONTRAVE ER 8-90 MG TABLET: 8-90 | 30 days supply | Qty: 120 | Fill #2 | Status: TO

## 2017-03-02 ENCOUNTER — Telehealth: Payer: Self-pay | Admitting: Neurology

## 2017-03-02 NOTE — Telephone Encounter (Signed)
Amanda James, I would like to schedule patient for an appt 3/30 at 11am. Can you please move the 11 and 1130 earlier in the week? Thank you

## 2017-03-03 NOTE — Telephone Encounter (Signed)
Appointment times cleared and 1 hr appt scheduled for this pt on 3/30 @ 11 am per MD request.

## 2017-03-06 ENCOUNTER — Ambulatory Visit (INDEPENDENT_AMBULATORY_CARE_PROVIDER_SITE_OTHER): Payer: 59 | Admitting: Family Medicine

## 2017-03-06 VITALS — BP 106/74 | HR 84 | Temp 99.5°F | Resp 16 | Ht 66.0 in | Wt 210.2 lb

## 2017-03-06 DIAGNOSIS — M545 Low back pain, unspecified: Secondary | ICD-10-CM

## 2017-03-06 DIAGNOSIS — G8929 Other chronic pain: Secondary | ICD-10-CM

## 2017-03-06 DIAGNOSIS — S29012A Strain of muscle and tendon of back wall of thorax, initial encounter: Secondary | ICD-10-CM

## 2017-03-06 MED ORDER — CYCLOBENZAPRINE HCL 10 MG PO TABS: 10.0000 mg | ORAL_TABLET | Freq: Three times a day (TID) | ORAL | 5 refills | Status: DC | PRN

## 2017-03-06 MED FILL — CYCLOBENZAPRINE 10 MG TAB: 10 | 10 days supply | Qty: 30 | Fill #0 | Status: TO

## 2017-03-06 NOTE — Progress Notes (Signed)
Pre visit review using our clinic review tool, if applicable. No additional management support is needed unless otherwise documented below in the visit note. chronic

## 2017-03-06 NOTE — Patient Instructions (Addendum)
Let us know if you need anything.  If you do not hear anything about your referral in the next 1-2 weeks, have your office manager call our office and ask for an update.

## 2017-03-06 NOTE — Progress Notes (Signed)
Musculoskeletal Exam  Patient: Amanda James DOB: 12-02-1971  DOS: 03/06/2017  SUBJECTIVE:  Chief Complaint:   Chief Complaint  Patient presents with  . Back Pain    Pt reports pain in lower and middle of back, Low back is Chroinc     Amanda James is a 46 y.o.  female for evaluation and treatment of L upper pain. She does have a history of chronic low back pain and states it is different than this. She did have a flare of this 2 weeks ago.  Onset:  2 days ago. Sudden, she was doing her hair and felt a pull in the L upper back region.  Location: L upper back Character:  aching  Progression of issue:  has significantly improved, her office manager wanted her to be seen Associated symptoms: None Denies: CP, shortness of breath, weakness, numbness, tingling Treatment: to date has been OTC NSAIDS.   Neurovascular symptoms: no  ROS: Musculoskeletal/Extremities: +R upper back pain Neurologic: no numbness, tingling no weakness   Past Medical History:  Diagnosis Date  . Allergy   . Depression   . Depression with anxiety   . Diabetes mellitus type 2 in obese (Amanda James)   . Diabetes mellitus without complication (Amanda James)   . History of chicken pox   . Hyperlipidemia   . Obesity 11/10/2016   Past Surgical History:  Procedure Laterality Date  . ABDOMINOPLASTY  2012  . BLADDER SUSPENSION  2016  . LAPAROSCOPIC GASTRIC BANDING  2005   esophageal dilation. band still present but released  . MASTOPEXY  2012   Family History  Problem Relation Age of Onset  . Diabetes Mother   . Hypertension Mother   . Hyperlipidemia Mother   . Obesity Mother   . Diabetes Father   . Heart disease Father     MI  . Hyperlipidemia Father   . Hypertension Father   . Obesity Father   . Diabetes Sister   . Hyperlipidemia Sister   . Hypertension Sister   . Obesity Sister   . Obesity Maternal Grandmother   . Hypertension Maternal Grandmother   . Hyperlipidemia Maternal Grandmother   . Diabetes Maternal  Grandmother   . Rosacea Maternal Grandmother   . Non-Hodgkin's lymphoma Maternal Grandmother   . Arthritis Maternal Grandmother     spinal stenosis  . Multiple myeloma Maternal Grandfather   . Hyperlipidemia Paternal Grandmother     rhabdo from statins  . Kidney disease Paternal Grandmother    Current Outpatient Prescriptions  Medication Sig Dispense Refill  . atorvastatin (LIPITOR) 10 MG tablet Take 1 tablet (10 mg total) by mouth daily. 90 tablet 3  . Cetirizine HCl (ZYRTEC ALLERGY) 10 MG CAPS Take 1 capsule by mouth daily.    [name] escitalopram (LEXAPRO) 20 MG tablet Take 1 tablet (20 mg total) by mouth daily. 90 tablet 3  . fluticasone (FLONASE) 50 MCG/ACT nasal spray Place 1 spray into both nostrils 2 (two) times daily.    [name] liraglutide (VICTOZA) 18 MG/3ML SOPN Inject 0.3 mLs (1.8 mg total) into the skin daily. GI upset with metformin, had hypoglycemia with glipizide, and was not well controlled on Januvia or Bydureon) 3 mL 6  . Naltrexone-Bupropion HCl ER (CONTRAVE) 8-90 MG TB12 Take 2 by mouth twice daily 120 tablet 6   Allergies  Allergen Reactions  . Erythromycin Diarrhea   Social History   Social History  . Marital status: Married   Social History Main Topics  . Smoking status: Never Smoker  .  Smokeless tobacco: Never Used  . Alcohol use 3.0 oz/week    5 Glasses of wine per week     Comment: 3-5 glasses of wine per week  . Drug use: No   Social History Narrative   Works With [employer], lives with husband, no major dietary restrictions. Exercise 30 minutes 3 x a week   Seat belts routinely    Objective: VITAL SIGNS: BP 106/74 (BP Location: Right Arm, Patient Position: Sitting, Cuff Size: Large)   Pulse 84   Temp 99.5 F (37.5 C) (Oral)   Resp 16   Ht '5\' 6"'  (1.676 m)   Wt 210 lb 3.2 oz (95.3 kg)   SpO2 99%   BMI 33.93 kg/m  Constitutional: Well formed, well developed. No acute distress. Cardiovascular: Brisk cap refill Thorax & Lungs: No accessory muscle  use Extremities: No clubbing. No cyanosis. No edema.  Skin: Warm. Dry. No erythema. No rash.  Musculoskeletal: Upper back.   Tenderness to palpation: yes Deformity: no Ecchymosis: no Tests negative: Straight leg Neurologic: Normal sensory function. No focal deficits noted. DTR's equal and symmetry in LE's. No clonus. Psychiatric: Normal mood. Age appropriate judgment and insight. Alert & oriented x 3.    PROCEDURE NOTE After discussing the procedure and risks, including but not limited to increased pain or stiffness and rarely nausea or dizziness, verbal consent was obtained.  Pre-procedure diagnosis: Somatic dysfunction Post-procedure diagnosis: Same Procedure: OMT  Regions treated include thoracic, rhomboid: Soft tissue/HVLA/MFR with the tissue response noted to be Improved.  The patient tolerated the procedure well, and there were no complications noted.  The patient was warned of the possibility of increased pain or stiffness of up to 48 hours duration and was asked to call with any unexpected problems.   Assessment:  Strain of rhomboid muscle, initial encounter  Chronic bilateral low back pain without sciatica - Plan: cyclobenzaprine (FLEXERIL) 10 MG tablet, Ambulatory referral to Physical Therapy  Plan: Orders as above. I do not believe anything sinister is going on related to her issue at hand. It sounds like it is mainly resolved. Will reorder Flexeril to use on an as needed basis. Continue using anti-inflammatories as needed. Referral to physical therapy given for her chronic low back pain issues. Follow-up with regular PCP as needed. The patient voiced understanding and agreement to the plan.   [place], DO 03/06/17  4:49 PM

## 2017-03-07 ENCOUNTER — Ambulatory Visit: Payer: Self-pay | Admitting: Neurology

## 2017-04-04 ENCOUNTER — Ambulatory Visit (INDEPENDENT_AMBULATORY_CARE_PROVIDER_SITE_OTHER): Payer: 59 | Admitting: Neurology

## 2017-04-04 ENCOUNTER — Encounter: Payer: Self-pay | Admitting: Neurology

## 2017-04-04 DIAGNOSIS — G43709 Chronic migraine without aura, not intractable, without status migrainosus: Secondary | ICD-10-CM | POA: Diagnosis not present

## 2017-04-04 MED ORDER — SUMATRIPTAN SUCCINATE 100 MG PO TABS: 100.0000 mg | ORAL_TABLET | Freq: Once | ORAL | 12 refills | Status: DC | PRN

## 2017-04-04 MED ORDER — TOPIRAMATE ER 50 MG PO SPRINKLE CAP24: 50.0000 mg | EXTENDED_RELEASE_CAPSULE | Freq: Every day | ORAL | 11 refills | Status: DC

## 2017-04-04 NOTE — Progress Notes (Deleted)
[facility]    Provider:  Dr Jaynee Eagles Referring Provider: Mosie Lukes, MD Primary Care Physician:  Penni Homans, MD  CC:  Migraine  HPI:  Amanda James is a 46 y.o. female here as a referral from Dr. Charlett Blake for migraines. She has a past medical history of type 2 diabetes, hyperlipidemia, obesity, allergies, depression, chronic low back pain, and vitamin D deficiency. Migraines started 20 years ago. Triggers include pulling her hair back and then it is a pounding pain. Her headaches can be 2-3x a day and can be daily or last days.She can go a week or longer without a headache but always with a least half the month with headaches. No medication overuse. Triggers for her headaches can be putting her hair back. She used ot get a headache with orgasm in the past which resolved. She has TMJ. She can have morning headaches with exacerbations. Red wine can trigger. Caffeine and food does not trigger. Her headaches tend to be on the top pf the head worse on the left. Throbbing and pounding. Mild nausea. No vomiting. No aura. She has light sensitivity, sound and smell sensitivity. She has a lot of musculoskeletal neck pain worse on the left. She goes to [facility] and massages help. Within 5 minutes the migraine can be at full peak. They can be moderately severe at least on average she can work throughout the day. They can wax and wane all day long, can last 2-3 days. She has more than 15 headaches in a month and 11-12 are migrainous. Her migraines have been at at this frequency for over a year. Wearing contacts can cause pain. She snores. Morning headaches. Unknown history. She has blurry vision with the headaches. She has had swishing and heartbeat in the ear when laying down. No other focal neurologic deficits, associated symptoms, inciting events or modifiable factors.  Failed Lexpro and Topiramate. Has been on Lexapro for many years, had side effects with Topiramate  Medications:  NSAIDs help better than tylenol, flexeril (doesn't help), Topiramate, Lexapro. Never tried a triptan.   Review of Systems: Patient complains of symptoms per HPI as well as the following symptoms: no CP, no SOB. Pertinent negatives per HPI. All others negative.   Social History   Social History   Marital status: Married    Spouse name: N/A   Number of children: 1   Years of education: MD   Occupational History   [employer]    Social History Main Topics   Smoking status: Never Smoker   Smokeless tobacco: Never Used   Alcohol use 3.0 oz/week    5 Glasses of wine per week     Comment: 3-5 glasses of wine per week   Drug use: No   Sexual activity: Not on file   Other Topics Concern   Not on file   Social History Narrative   Works With [employer], lives with husband, 3 cats   No major dietary restrictions. Exercise 30 minutes 3 x a week   Seat belts routinely   Right-handed   Caffeine: 1-2 cups of coffee per day    Family History  Problem Relation Age of Onset   Diabetes Mother    Hypertension Mother    Hyperlipidemia Mother    Obesity Mother    Diabetes Father    Heart disease Father     MI   Hyperlipidemia Father    Hypertension Father    Obesity Father    Diabetes Sister  Hyperlipidemia Sister    Hypertension Sister    Obesity Sister    Obesity Maternal Grandmother    Hypertension Maternal Grandmother    Hyperlipidemia Maternal Grandmother    Diabetes Maternal Grandmother    Rosacea Maternal Grandmother    Non-Hodgkin's lymphoma Maternal Grandmother    Arthritis Maternal Grandmother     spinal stenosis   Multiple myeloma Maternal Grandfather    Hyperlipidemia Paternal Grandmother     rhabdo from statins   Kidney disease Paternal Grandmother    Migraines Neg Hx     Past Medical History:  Diagnosis Date   Allergy    Depression    Depression with anxiety    Diabetes mellitus type 2 in obese ([place])    Diabetes  mellitus without complication ([place])    History of chicken pox    Hyperlipidemia    Low back pain    Obesity 11/10/2016    Past Surgical History:  Procedure Laterality Date   ABDOMINOPLASTY  2012   BLADDER SUSPENSION  2016   LAPAROSCOPIC GASTRIC BANDING  2005   esophageal dilation. band still present but released   MASTOPEXY  2012    Current Outpatient Prescriptions  Medication Sig Dispense Refill   atorvastatin (LIPITOR) 10 MG tablet Take 1 tablet (10 mg total) by mouth daily. 90 tablet 3   Cetirizine HCl (ZYRTEC ALLERGY) 10 MG CAPS Take 1 capsule by mouth daily.     cyclobenzaprine (FLEXERIL) 10 MG tablet Take 1 tablet (10 mg total) by mouth 3 (three) times daily as needed for muscle spasms. 30 tablet 5   escitalopram (LEXAPRO) 20 MG tablet Take 1 tablet (20 mg total) by mouth daily. 90 tablet 3   fluticasone (FLONASE) 50 MCG/ACT nasal spray Place 1 spray into both nostrils 2 (two) times daily.     liraglutide (VICTOZA) 18 MG/3ML SOPN Inject 0.3 mLs (1.8 mg total) into the skin daily. GI upset with metformin, had hypoglycemia with glipizide, and was not well controlled on Januvia or Bydureon) 3 mL 6   Naltrexone-Bupropion HCl ER (CONTRAVE) 8-90 MG TB12 Take 2 by mouth twice daily 120 tablet 6   SUMAtriptan (IMITREX) 100 MG tablet Take 1 tablet (100 mg total) by mouth once as needed. May repeat in 2 hours if headache persists or recurs. 10 tablet 12   Topiramate ER (QUDEXY XR) 50 MG CS24 Take 50 mg by mouth at bedtime. 30 each 11   No current facility-administered medications for this visit.     Allergies as of 04/04/2017 - Review Complete 04/04/2017  Allergen Reaction Noted   Erythromycin Diarrhea 09/17/2016    Vitals: BP 120/81    Pulse 75    Ht '5\' 6"'  (1.676 m)    Wt 210 lb 9.6 oz (95.5 kg)    BMI 33.99 kg/m  Last Weight:  Wt Readings from Last 1 Encounters:  04/04/17 210 lb 9.6 oz (95.5 kg)   Last Height:   Ht Readings from Last 1 Encounters:  04/04/17  '5\' 6"'  (1.676 m)    Physical exam: Exam: Gen: NAD, conversant, well nourised, obese, well groomed                     CV: RRR, no MRG. No Carotid Bruits. No peripheral edema, warm, nontender Eyes: Conjunctivae clear without exudates or hemorrhage  Neuro: Detailed Neurologic Exam  Speech:    Speech is normal; fluent and spontaneous with normal comprehension.  Cognition:    The patient is oriented to  person, place, and time;     recent and remote memory intact;     language fluent;     normal attention, concentration,     fund of knowledge Cranial Nerves:    The pupils are equal, round, and reactive to light. The fundi are normal and spontaneous venous pulsations are present. Visual fields are full to finger confrontation. Extraocular movements are intact. Trigeminal sensation is intact and the muscles of mastication are normal. The face is symmetric. The palate elevates in the midline. Hearing intact. Voice is normal. Shoulder shrug is normal. The tongue has normal motion without fasciculations.   Coordination:    Normal finger to nose and heel to shin. Normal rapid alternating movements.   Gait:    Heel-toe and tandem gait are normal.   Motor Observation:    No asymmetry, no atrophy, and no involuntary movements noted. Tone:    Normal muscle tone.    Posture:    Posture is normal. normal erect    Strength:    Strength is V/V in the upper and lower limbs.      Sensation: intact to LT     Reflex Exam:  DTR's:    Deep tendon reflexes in the upper and lower extremities are normal bilaterally.   Toes:    The toes are downgoing bilaterally.   Clonus:    Clonus is absent.      Assessment/Plan:  11 46 year old female with chronic migraines, without aura  Discussed acute and preventative migraine management Recommend Botox therapy, she has failed multiple classes of medications Imitrex at onset of headache Recommend possibly retrying Topiramate but patient had  side effects, will consider trying ER formulary  Discussed the following: To prevent or relieve headaches, try the following: Cool Compress. Lie down and place a cool compress on your head.  Avoid headache triggers. If certain foods or odors seem to have triggered your migraines in the past, avoid them. A headache diary might help you identify triggers.  Include physical activity in your daily routine. Try a daily walk or other moderate aerobic exercise.  Manage stress. Find healthy ways to cope with the stressors, such as delegating tasks on your to-do list.  Practice relaxation techniques. Try deep breathing, yoga, massage and visualization.  Eat regularly. Eating regularly scheduled meals and maintaining a healthy diet might help prevent headaches. Also, drink plenty of fluids.  Follow a regular sleep schedule. Sleep deprivation might contribute to headaches Consider biofeedback. With this mind-body technique, you learn to control certain bodily functions -- such as muscle tension, heart rate and blood pressure -- to prevent headaches or reduce headache pain.    Proceed to emergency room if you experience new or worsening symptoms or symptoms do not resolve, if you have new neurologic symptoms or if headache is severe, or for any concerning symptom.    Sarina Ill, MD  [facility] [address] [place], [place] [address]  Phone [phone] Fax [phone]  A total of 60 minutes was spent face-to-face with this patient. Over half this time was spent on counseling patient on the chronic migraine diagnosis and different diagnostic and therapeutic options available.

## 2017-04-04 NOTE — Progress Notes (Signed)
Botox 100 units/vial x 2 from samples  NDC 1610-9604-54 Lot [id number] Exp DEC 2018  Diluted in 4 ml of Bacteriostatic 0.9% NaCl NDC 0981-1914-78 Lot [id number]

## 2017-04-06 ENCOUNTER — Encounter: Payer: Self-pay | Admitting: Neurology

## 2017-04-06 DIAGNOSIS — G43709 Chronic migraine without aura, not intractable, without status migrainosus: Secondary | ICD-10-CM | POA: Insufficient documentation

## 2017-04-10 ENCOUNTER — Telehealth: Payer: Self-pay | Admitting: Neurology

## 2017-04-10 NOTE — Telephone Encounter (Signed)
Pt returned Danielle's call °

## 2017-04-10 NOTE — Telephone Encounter (Signed)
I called the patient to schedule her botox injection in three months. She did not answer so I left a VM asking her to call back.

## 2017-04-13 ENCOUNTER — Telehealth: Payer: 59 | Admitting: Family

## 2017-04-13 DIAGNOSIS — J028 Acute pharyngitis due to other specified organisms: Secondary | ICD-10-CM | POA: Diagnosis not present

## 2017-04-13 DIAGNOSIS — B9689 Other specified bacterial agents as the cause of diseases classified elsewhere: Secondary | ICD-10-CM

## 2017-04-13 MED ORDER — PREDNISONE 5 MG PO TABS: 5.0000 mg | ORAL_TABLET | ORAL | 0 refills | Status: DC

## 2017-04-13 MED ORDER — BENZONATATE 100 MG PO CAPS: 100.0000 mg | ORAL_CAPSULE | Freq: Three times a day (TID) | ORAL | 0 refills | Status: DC | PRN

## 2017-04-13 MED ORDER — DOXYCYCLINE HYCLATE 100 MG PO TABS: 100.0000 mg | ORAL_TABLET | Freq: Two times a day (BID) | ORAL | 0 refills | Status: DC

## 2017-04-13 NOTE — Progress Notes (Signed)
We are sorry that you are not feeling well.  Here is how we plan to help!  Based on what you have shared with me it looks like you have upper respiratory tract inflammation that has resulted in a significant cough.  Inflammation and infection in the upper respiratory tract is commonly called bronchitis and has four common causes:  Allergies, Viral Infections, Acid Reflux and Bacterial Infections.  Allergies, viruses and acid reflux are treated by controlling symptoms or eliminating the cause. An example might be a cough caused by taking certain blood pressure medications. You stop the cough by changing the medication. Another example might be a cough caused by acid reflux. Controlling the reflux helps control the cough.  Based on your presentation I believe you most likely have A cough due to bacteria.  When patients have a fever and a productive cough with a change in color or increased sputum production, we are concerned about bacterial bronchitis.  If left untreated it can progress to pneumonia.  If your symptoms do not improve with your treatment plan it is important that you contact your provider.   I hve prescribed Doxycycline 100 mg twice a day for 7 days     In addition you may use A non-prescription cough medication called Mucinex DM: take 2 tablets every 12 hours. and A prescription cough medication called Tessalon Perles 100mg. You may take 1-2 capsules every 8 hours as needed for your cough.  Sterapred 5 mg dosepak  USE OF BRONCHODILATOR ("RESCUE") INHALERS: There is a risk from using your bronchodilator too frequently.  The risk is that over-reliance on a medication which only relaxes the muscles surrounding the breathing tubes can reduce the effectiveness of medications prescribed to reduce swelling and congestion of the tubes themselves.  Although you feel brief relief from the bronchodilator inhaler, your asthma may actually be worsening with the tubes becoming more swollen and filled with  mucus.  This can delay other crucial treatments, such as oral steroid medications. If you need to use a bronchodilator inhaler daily, several times per day, you should discuss this with your provider.  There are probably better treatments that could be used to keep your asthma under control.     HOME CARE . Only take medications as instructed by your medical team. . Complete the entire course of an antibiotic. . Drink plenty of fluids and get plenty of rest. . Avoid close contacts especially the very young and the elderly . Cover your mouth if you cough or cough into your sleeve. . Always remember to wash your hands . A steam or ultrasonic humidifier can help congestion.   GET HELP RIGHT AWAY IF: . You develop worsening fever. . You become short of breath . You cough up blood. . Your symptoms persist after you have completed your treatment plan MAKE SURE YOU   Understand these instructions.  Will watch your condition.  Will get help right away if you are not doing well or get worse.  Your e-visit answers were reviewed by a board certified advanced clinical practitioner to complete your personal care plan.  Depending on the condition, your plan could have included both over the counter or prescription medications. If there is a problem please reply  once you have received a response from your provider. Your safety is important to us.  If you have drug allergies check your prescription carefully.    You can use MyChart to ask questions about today's visit, request a non-urgent call   back, or ask for a work or school excuse for 24 hours related to this e-Visit. If it has been greater than 24 hours you will need to follow up with your provider, or enter a new e-Visit to address those concerns. You will get an e-mail in the next two days asking about your experience.  I hope that your e-visit has been valuable and will speed your recovery. Thank you for using e-visits.   

## 2017-04-14 NOTE — Telephone Encounter (Signed)
I returned the patients call and left her a VM with a time and date of apt. I asked her to call back if that apt would not work.

## 2017-04-27 ENCOUNTER — Encounter: Payer: Self-pay | Admitting: Neurology

## 2017-05-02 ENCOUNTER — Other Ambulatory Visit (HOSPITAL_COMMUNITY)
Admission: RE | Admit: 2017-05-02 | Discharge: 2017-05-02 | Disposition: A | Discharge status: Home / Self Care | Payer: 59 | Source: Ambulatory Visit | Attending: Family Medicine | Admitting: Family Medicine

## 2017-05-02 ENCOUNTER — Telehealth: Payer: Self-pay

## 2017-05-02 ENCOUNTER — Ambulatory Visit (INDEPENDENT_AMBULATORY_CARE_PROVIDER_SITE_OTHER): Payer: 59 | Admitting: Family Medicine

## 2017-05-02 ENCOUNTER — Encounter: Payer: Self-pay | Admitting: Family Medicine

## 2017-05-02 VITALS — BP 98/72 | HR 68 | Temp 98.3°F | Resp 18 | Ht 66.0 in | Wt 208.0 lb

## 2017-05-02 DIAGNOSIS — E669 Obesity, unspecified: Secondary | ICD-10-CM

## 2017-05-02 DIAGNOSIS — E559 Vitamin D deficiency, unspecified: Secondary | ICD-10-CM

## 2017-05-02 DIAGNOSIS — Z0000 Encounter for general adult medical examination without abnormal findings: Secondary | ICD-10-CM

## 2017-05-02 DIAGNOSIS — E782 Mixed hyperlipidemia: Secondary | ICD-10-CM | POA: Diagnosis not present

## 2017-05-02 DIAGNOSIS — G43909 Migraine, unspecified, not intractable, without status migrainosus: Secondary | ICD-10-CM | POA: Diagnosis not present

## 2017-05-02 DIAGNOSIS — E1169 Type 2 diabetes mellitus with other specified complication: Secondary | ICD-10-CM | POA: Diagnosis not present

## 2017-05-02 DIAGNOSIS — E6609 Other obesity due to excess calories: Secondary | ICD-10-CM | POA: Diagnosis not present

## 2017-05-02 DIAGNOSIS — Z124 Encounter for screening for malignant neoplasm of cervix: Secondary | ICD-10-CM | POA: Insufficient documentation

## 2017-05-02 HISTORY — DX: Encounter for screening for malignant neoplasm of cervix: Z12.4

## 2017-05-02 LAB — HEMOGLOBIN A1C: HEMOGLOBIN A1C: 5.3 % (ref 4.6–6.5)

## 2017-05-02 LAB — COMPREHENSIVE METABOLIC PANEL
ALK PHOS: 60 U/L (ref 39–117)
ALT: 20 U/L (ref 0–35)
AST: 17 U/L (ref 0–37)
Albumin: 4.1 g/dL (ref 3.5–5.2)
BILIRUBIN TOTAL: 1.2 mg/dL (ref 0.2–1.2)
BUN: 17 mg/dL (ref 6–23)
CALCIUM: 9.2 mg/dL (ref 8.4–10.5)
CO2: 28 meq/L (ref 19–32)
Chloride: 102 mEq/L (ref 96–112)
Creatinine, Ser: 0.82 mg/dL (ref 0.40–1.20)
GFR: 79.5 mL/min (ref >60.00)
Glucose, Bld: 79 mg/dL (ref 70–99)
Potassium: 4 mEq/L (ref 3.5–5.1)
Sodium: 136 mEq/L (ref 135–145)
Total Protein: 6.8 g/dL (ref 6.0–8.3)

## 2017-05-02 LAB — CBC
HEMATOCRIT: 38.8 % (ref 36.0–46.0)
HEMOGLOBIN: 13.1 g/dL (ref 12.0–15.0)
MCHC: 33.8 g/dL (ref 30.0–36.0)
MCV: 96.6 fl (ref 78.0–100.0)
PLATELETS: 333 10*3/uL (ref 150.0–400.0)
RBC: 4.02 Mil/uL (ref 3.87–5.11)
RDW: 13.9 % (ref 11.5–15.5)
WBC: 8.5 10*3/uL (ref 4.0–10.5)

## 2017-05-02 LAB — VITAMIN D 25 HYDROXY (VIT D DEFICIENCY, FRACTURES): VITD: 29.07 ng/mL — ABNORMAL LOW (ref 30.00–100.00)

## 2017-05-02 LAB — MICROALBUMIN / CREATININE URINE RATIO
CREATININE, URINE: 90 mg/dL (ref 20–320)
MICROALB UR: 0.2 mg/dL
Microalb Creat Ratio: 2 mcg/mg creat (ref <30)

## 2017-05-02 LAB — LIPID PANEL
CHOL/HDL RATIO: 3
Cholesterol: 132 mg/dL (ref 0–200)
HDL: 52 mg/dL (ref >39.00)
LDL Cholesterol: 49 mg/dL (ref 0–99)
NonHDL: 79.8
TRIGLYCERIDES: 152 mg/dL — AB (ref 0.0–149.0)
VLDL: 30.4 mg/dL (ref 0.0–40.0)

## 2017-05-02 LAB — TSH: TSH: 2.59 u[IU]/mL (ref 0.35–4.50)

## 2017-05-02 MED FILL — VICTOZA 18 MG/3 ML INJECT P: 18 | 30 days supply | Qty: 9 | Fill #3 | Status: TO

## 2017-05-02 NOTE — Patient Instructions (Signed)
Preventive Care 40-64 Years, Female Preventive care refers to lifestyle choices and visits with your health care provider that can promote health and wellness. What does preventive care include?  A yearly physical exam. This is also called an annual well check.  Dental exams once or twice a year.  Routine eye exams. Ask your health care provider how often you should have your eyes checked.  Personal lifestyle choices, including:  Daily care of your teeth and gums.  Regular physical activity.  Eating a healthy diet.  Avoiding tobacco and drug use.  Limiting alcohol use.  Practicing safe sex.  Taking low-dose aspirin daily starting at age 52.  Taking vitamin and mineral supplements as recommended by your health care provider. What happens during an annual well check? The services and screenings done by your health care provider during your annual well check will depend on your age, overall health, lifestyle risk factors, and family history of disease. Counseling  Your health care provider may ask you questions about your:  Alcohol use.  Tobacco use.  Drug use.  Emotional well-being.  Home and relationship well-being.  Sexual activity.  Eating habits.  Work and work Statistician.  Method of birth control.  Menstrual cycle.  Pregnancy history. Screening  You may have the following tests or measurements:  Height, weight, and BMI.  Blood pressure.  Lipid and cholesterol levels. These may be checked every 5 years, or more frequently if you are over 36 years old.  Skin check.  Lung cancer screening. You may have this screening every year starting at age 50 if you have a 30-pack-year history of smoking and currently smoke or have quit within the past 15 years.  Fecal occult blood test (FOBT) of the stool. You may have this test every year starting at age 37.  Flexible sigmoidoscopy or colonoscopy. You may have a sigmoidoscopy every 5 years or a colonoscopy  every 10 years starting at age 37.  Hepatitis C blood test.  Hepatitis B blood test.  Sexually transmitted disease (STD) testing.  Diabetes screening. This is done by checking your blood sugar (glucose) after you have not eaten for a while (fasting). You may have this done every 1-3 years.  Mammogram. This may be done every 1-2 years. Talk to your health care provider about when you should start having regular mammograms. This may depend on whether you have a family history of breast cancer.  BRCA-related cancer screening. This may be done if you have a family history of breast, ovarian, tubal, or peritoneal cancers.  Pelvic exam and Pap test. This may be done every 3 years starting at age 41. Starting at age 87, this may be done every 5 years if you have a Pap test in combination with an HPV test.  Bone density scan. This is done to screen for osteoporosis. You may have this scan if you are at high risk for osteoporosis. Discuss your test results, treatment options, and if necessary, the need for more tests with your health care provider. Vaccines  Your health care provider may recommend certain vaccines, such as:  Influenza vaccine. This is recommended every year.  Tetanus, diphtheria, and acellular pertussis (Tdap, Td) vaccine. You may need a Td booster every 10 years.  Varicella vaccine. You may need this if you have not been vaccinated.  Zoster vaccine. You may need this after age 61.  Measles, mumps, and rubella (MMR) vaccine. You may need at least one dose of MMR if you were born  in 1957 or later. You may also need a second dose.  Pneumococcal 13-valent conjugate (PCV13) vaccine. You may need this if you have certain conditions and were not previously vaccinated.  Pneumococcal polysaccharide (PPSV23) vaccine. You may need one or two doses if you smoke cigarettes or if you have certain conditions.  Meningococcal vaccine. You may need this if you have certain  conditions.  Hepatitis A vaccine. You may need this if you have certain conditions or if you travel or work in places where you may be exposed to hepatitis A.  Hepatitis B vaccine. You may need this if you have certain conditions or if you travel or work in places where you may be exposed to hepatitis B.  Haemophilus influenzae type b (Hib) vaccine. You may need this if you have certain conditions. Talk to your health care provider about which screenings and vaccines you need and how often you need them. This information is not intended to replace advice given to you by your health care provider. Make sure you discuss any questions you have with your health care provider. Document Released: 12/22/2015 Document Revised: 08/14/2016 Document Reviewed: 09/26/2015 Elsevier Interactive Patient Education  2017 [employer].

## 2017-05-02 NOTE — Progress Notes (Signed)
Subjective:  I acted as a Education administrator for Dr. Charlett Blake. [place], [place]  Patient ID: Amanda James, female    DOB: 19-Mar-1971, 46 y.o.   MRN: [id number]  Chief Complaint  Patient presents with  . Annual Exam    HPI  Patient is in today for an annual exam with gyn. Patient has no acute concerns. No recent febrile illness or acute hospitalizations. Denies CP/palp/SOB/HA/congestion/fevers/GI or GU c/o. Taking meds as prescribed. She is doing well today. She settling into her new office well and has no concerns. No recent febrile illness or hospitalizations. Denies CP/palp/SOB/HA/congestion/fevers/GI or GU c/o. Taking meds as prescribed   Patient Care Team: Mosie Lukes, MD as PCP - General (Family Medicine) Barrington Ellison, RN as [facility] Management   Past Medical History:  Diagnosis Date  . Allergy   . Cervical cancer screening 05/02/2017  . Depression   . Depression with anxiety   . Diabetes mellitus type 2 in obese ([place])   . Diabetes mellitus without complication ([place])   . History of chicken pox   . Hyperlipidemia   . Low back pain   . Migraines   . Obesity 11/10/2016    Past Surgical History:  Procedure Laterality Date  . ABDOMINOPLASTY  2012  . BLADDER SUSPENSION  2016  . LAPAROSCOPIC GASTRIC BANDING  2005   esophageal dilation. band still present but released  . MASTOPEXY  2012    Family History  Problem Relation Age of Onset  . Diabetes Mother   . Hypertension Mother   . Hyperlipidemia Mother   . Obesity Mother   . Heart disease Mother        pacer  . Diabetes Father   . Heart disease Father        MI  . Hyperlipidemia Father   . Hypertension Father   . Obesity Father   . Diabetes Sister   . Hyperlipidemia Sister   . Hypertension Sister   . Obesity Sister   . Obesity Maternal Grandmother   . Hypertension Maternal Grandmother   . Hyperlipidemia Maternal Grandmother   . Diabetes Maternal Grandmother   . Rosacea Maternal Grandmother   .  Non-Hodgkin's lymphoma Maternal Grandmother   . Arthritis Maternal Grandmother        spinal stenosis  . Multiple myeloma Maternal Grandfather   . Hyperlipidemia Paternal Grandmother        rhabdo from statins  . Kidney disease Paternal Grandmother   . Migraines Neg Hx     Social History   Social History  . Marital status: Married    Spouse name: N/A  . Number of children: 1  . Years of education: MD   Occupational History  . [employer]    Social History Main Topics  . Smoking status: Never Smoker  . Smokeless tobacco: Never Used  . Alcohol use 3.0 oz/week    5 Glasses of wine per week     Comment: 3-5 glasses of wine per week  . Drug use: No  . Sexual activity: Not on file   Other Topics Concern  . Not on file   Social History Narrative   Works With [employer], lives with husband, 3 cats   No major dietary restrictions. Exercise 30 minutes 3 x a week   Seat belts routinely   Right-handed   Caffeine: 1-2 cups of coffee per day    Outpatient Medications Prior to Visit  Medication Sig Dispense Refill  . atorvastatin (LIPITOR) 10  MG tablet Take 1 tablet (10 mg total) by mouth daily. 90 tablet 3  . Cetirizine HCl (ZYRTEC ALLERGY) 10 MG CAPS Take 1 capsule by mouth daily.    . cyclobenzaprine (FLEXERIL) 10 MG tablet Take 1 tablet (10 mg total) by mouth 3 (three) times daily as needed for muscle spasms. 30 tablet 5  . escitalopram (LEXAPRO) 20 MG tablet Take 1 tablet (20 mg total) by mouth daily. 90 tablet 3  . fluticasone (FLONASE) 50 MCG/ACT nasal spray Place 1 spray into both nostrils 2 (two) times daily.    [name] liraglutide (VICTOZA) 18 MG/3ML SOPN Inject 0.3 mLs (1.8 mg total) into the skin daily. GI upset with metformin, had hypoglycemia with glipizide, and was not well controlled on Januvia or Bydureon) 3 mL 6  . Naltrexone-Bupropion HCl ER (CONTRAVE) 8-90 MG TB12 Take 2 by mouth twice daily 120 tablet 6  . predniSONE (DELTASONE) 5 MG tablet Take 1 tablet (5 mg total) by  mouth as directed. sterapred 21 dose taper, generic 21 tablet 0  . SUMAtriptan (IMITREX) 100 MG tablet Take 1 tablet (100 mg total) by mouth once as needed. May repeat in 2 hours if headache persists or recurs. 10 tablet 12  . benzonatate (TESSALON PERLES) 100 MG capsule Take 1-2 capsules (100-200 mg total) by mouth every 8 (eight) hours as needed for cough. 30 capsule 0  . doxycycline (VIBRA-TABS) 100 MG tablet Take 1 tablet (100 mg total) by mouth 2 (two) times daily. 14 tablet 0  . Topiramate ER (QUDEXY XR) 50 MG CS24 Take 50 mg by mouth at bedtime. 30 each 11   No facility-administered medications prior to visit.     Allergies  Allergen Reactions  . Erythromycin Diarrhea    Review of Systems  Constitutional: Negative for fever and malaise/fatigue.  HENT: Negative for congestion.   Eyes: Negative for blurred vision.  Respiratory: Negative for cough and shortness of breath.   Cardiovascular: Negative for chest pain, palpitations and leg swelling.  Gastrointestinal: Negative for vomiting.  Musculoskeletal: Negative for back pain.  Skin: Negative for rash.  Neurological: Negative for loss of consciousness and headaches.       Objective:    Physical Exam  Constitutional: She is oriented to person, place, and time. She appears well-developed and well-nourished. No distress.  HENT:  Head: Normocephalic and atraumatic.  Eyes: Conjunctivae are normal.  Neck: Normal range of motion. No thyromegaly present.  Cardiovascular: Normal rate and regular rhythm.   Pulmonary/Chest: Effort normal and breath sounds normal. She has no wheezes.  Abdominal: Soft. Bowel sounds are normal. There is no tenderness.  Musculoskeletal: Normal range of motion. She exhibits no edema or deformity.  Neurological: She is alert and oriented to person, place, and time.  Skin: Skin is warm and dry. She is not diaphoretic.  Psychiatric: She has a normal mood and affect.    BP 98/72 (BP Location: Left Arm,  Patient Position: Sitting, Cuff Size: Normal)   Pulse 68   Temp 98.3 F (36.8 C) (Oral)   Resp 18   Ht '5\' 6"'  (1.676 m)   Wt 208 lb (94.3 kg)   LMP 04/25/2017   SpO2 97%   BMI 33.57 kg/m  Wt Readings from Last 3 Encounters:  05/02/17 208 lb (94.3 kg)  04/04/17 210 lb 9.6 oz (95.5 kg)  03/06/17 210 lb 3.2 oz (95.3 kg)   BP Readings from Last 3 Encounters:  05/02/17 98/72  04/04/17 120/81  03/06/17 106/74  Immunization History  Administered Date(s) Administered  . Influenza-Unspecified 09/05/2016  . Pneumococcal Conjugate-13 12/09/2013  . Pneumococcal Polysaccharide-23 12/09/2013  . Tdap 09/05/2016    Health Maintenance  Topic Date Due  . OPHTHALMOLOGY EXAM  07/27/1980  . HIV Screening  07/27/1985  . PAP SMEAR  07/28/1991  . INFLUENZA VACCINE  07/09/2017  . HEMOGLOBIN A1C  11/02/2017  . FOOT EXAM  05/02/2018  . URINE MICROALBUMIN  05/02/2018  . PNEUMOCOCCAL POLYSACCHARIDE VACCINE (2) 12/09/2018  . TETANUS/TDAP  09/05/2026    Lab Results  Component Value Date   WBC 8.5 05/02/2017   HGB 13.1 05/02/2017   HCT 38.8 05/02/2017   PLT 333.0 05/02/2017   GLUCOSE 79 05/02/2017   CHOL 132 05/02/2017   TRIG 152.0 (H) 05/02/2017   HDL 52.00 05/02/2017   LDLCALC 49 05/02/2017   ALT 20 05/02/2017   AST 17 05/02/2017   NA 136 05/02/2017   K 4.0 05/02/2017   CL 102 05/02/2017   CREATININE 0.82 05/02/2017   BUN 17 05/02/2017   CO2 28 05/02/2017   TSH 2.59 05/02/2017   INR 1.04 09/17/2016   HGBA1C 5.3 05/02/2017   MICROALBUR 0.2 05/02/2017    Lab Results  Component Value Date   TSH 2.59 05/02/2017   Lab Results  Component Value Date   WBC 8.5 05/02/2017   HGB 13.1 05/02/2017   HCT 38.8 05/02/2017   MCV 96.6 05/02/2017   PLT 333.0 05/02/2017   Lab Results  Component Value Date   NA 136 05/02/2017   K 4.0 05/02/2017   CO2 28 05/02/2017   GLUCOSE 79 05/02/2017   BUN 17 05/02/2017   CREATININE 0.82 05/02/2017   BILITOT 1.2 05/02/2017   ALKPHOS 60  05/02/2017   AST 17 05/02/2017   ALT 20 05/02/2017   PROT 6.8 05/02/2017   ALBUMIN 4.1 05/02/2017   CALCIUM 9.2 05/02/2017   ANIONGAP 5 09/17/2016   GFR 79.50 05/02/2017   Lab Results  Component Value Date   CHOL 132 05/02/2017   Lab Results  Component Value Date   HDL 52.00 05/02/2017   Lab Results  Component Value Date   LDLCALC 49 05/02/2017   Lab Results  Component Value Date   TRIG 152.0 (H) 05/02/2017   Lab Results  Component Value Date   CHOLHDL 3 05/02/2017   Lab Results  Component Value Date   HGBA1C 5.3 05/02/2017         Assessment & Plan:   Problem List Items Addressed This Visit    Diabetes mellitus type 2 in obese ([place]) - Primary    hgba1c acceptable, minimize simple carbs. Increase exercise as tolerated. Continue current meds. Improved s/p bypass. Continue to monitor      Relevant Orders   Hemoglobin A1c (Completed)   TSH (Completed)   Microalbumin / creatinine urine ratio (Completed)   Hyperlipidemia    Encouraged heart healthy diet, increase exercise, avoid trans fats, consider a krill oil cap daily      Relevant Orders   Lipid panel (Completed)   Obesity    Encouraged DASH diet, decrease po intake and increase exercise as tolerated. Needs 7-8 hours of sleep nightly. Avoid trans fats, eat small, frequent meals every 4-5 hours with lean proteins, complex carbs and healthy fats. Minimize simple carbs.       Vitamin D deficiency     take daily Vitamin D over the counter. If already taking a daily supplement increase by 1000 IU daily and if not start  Vitamin D 2000 IU daily.       Relevant Orders   Vitamin D (25 hydroxy) (Completed)   Preventative health care    Patient encouraged to maintain heart healthy diet, regular exercise, adequate sleep. Consider daily probiotics. Take medications as prescribed. Given and reviewed copy of ACP documents from [employer] and encouraged to complete and return. Labs ordered and reviewed        RESOLVED: Migraines   Relevant Orders   CBC (Completed)   Comprehensive metabolic panel (Completed)   Cervical cancer screening    Pap today, no concerns on exam. Pap not due this year.      Relevant Orders   Cytology - PAP      I have discontinued Ms. Bartolo's Topiramate ER, doxycycline, and benzonatate. I am also having her maintain her fluticasone, Cetirizine HCl, atorvastatin, escitalopram, liraglutide, Naltrexone-Bupropion HCl ER, cyclobenzaprine, SUMAtriptan, and predniSONE.  No orders of the defined types were placed in this encounter.   CMA served as Education administrator during this visit. History, Physical and Plan performed by medical provider. Documentation and orders reviewed and attested to.  Penni Homans, MD

## 2017-05-02 NOTE — Assessment & Plan Note (Signed)
Encouraged DASH diet, decrease po intake and increase exercise as tolerated. Needs 7-8 hours of sleep nightly. Avoid trans fats, eat small, frequent meals every 4-5 hours with lean proteins, complex carbs and healthy fats. Minimize simple carbs 

## 2017-05-02 NOTE — Telephone Encounter (Signed)
PA initiated via Covermymeds; KEY: [id number]. Awaiting determination.

## 2017-05-02 NOTE — Assessment & Plan Note (Addendum)
Pap today, no concerns on exam. Pap not due this year.

## 2017-05-05 NOTE — Assessment & Plan Note (Signed)
Encouraged heart healthy diet, increase exercise, avoid trans fats, consider a krill oil cap daily 

## 2017-05-05 NOTE — Assessment & Plan Note (Signed)
take daily Vitamin D over the counter. If already taking a daily supplement increase by 1000 IU daily and if not start Vitamin D 2000 IU daily.  

## 2017-05-05 NOTE — Assessment & Plan Note (Signed)
Patient encouraged to maintain heart healthy diet, regular exercise, adequate sleep. Consider daily probiotics. Take medications as prescribed. Given and reviewed copy of ACP documents from [place] Secretary of State and encouraged to complete and return. Labs ordered and reviewed.  

## 2017-05-05 NOTE — Assessment & Plan Note (Signed)
hgba1c acceptable, minimize simple carbs. Increase exercise as tolerated. Continue current meds. Improved s/p bypass. Continue to monitor

## 2017-05-06 LAB — CYTOLOGY - PAP
Bacterial vaginitis: NEGATIVE
CANDIDA VAGINITIS: NEGATIVE
Chlamydia: NEGATIVE
Diagnosis: NEGATIVE
HPV: NOT DETECTED
Neisseria Gonorrhea: NEGATIVE
TRICH (WINDOWPATH): NEGATIVE

## 2017-05-12 NOTE — Telephone Encounter (Signed)
Caller name: Tonia GhentMarriel  Relation to pt: from [employer]  Call back number: [phone]  Pharmacy:  Reason for call:  additional clinical questions needed faxing over form to [phone] or you call call and give verbal.

## 2017-05-12 NOTE — Telephone Encounter (Signed)
Form received, questions answered to best of my ability and faxed back to [employer] at [phone]. Awaiting determination.

## 2017-05-14 NOTE — Telephone Encounter (Signed)
Received PA approval through 08/13/2017.

## 2017-06-04 MED FILL — ESCITALOPRAM 20 MG TABLET: 20 | 90 days supply | Qty: 90 | Fill #0

## 2017-06-04 MED FILL — CONTRAVE ER 8-90 MG TABLET: 8-90 | 30 days supply | Qty: 120 | Fill #0

## 2017-06-04 MED FILL — ATORVASTATIN 10 MG TABLET: 10 | 90 days supply | Qty: 90 | Fill #0

## 2017-06-04 MED FILL — VICTOZA 18 MG/3 ML INJECT P: 18 | 30 days supply | Qty: 9 | Fill #0

## 2017-06-11 ENCOUNTER — Telehealth: Payer: 59 | Admitting: Physician Assistant

## 2017-06-11 DIAGNOSIS — R399 Unspecified symptoms and signs involving the genitourinary system: Secondary | ICD-10-CM | POA: Diagnosis not present

## 2017-06-11 MED ORDER — CIPROFLOXACIN HCL 500 MG PO TABS: 500.0000 mg | ORAL_TABLET | Freq: Two times a day (BID) | ORAL | 0 refills | Status: DC

## 2017-06-11 NOTE — Progress Notes (Signed)

## 2017-06-12 ENCOUNTER — Ambulatory Visit: Payer: 59 | Admitting: Neurology

## 2017-06-20 ENCOUNTER — Ambulatory Visit: Payer: Self-pay | Admitting: Neurology

## 2017-07-03 MED FILL — CONTRAVE ER 8-90 MG TABLET: 8-90 | 30 days supply | Qty: 120 | Fill #1

## 2017-07-03 MED FILL — VICTOZA 18 MG/3 ML INJECT P: 18 | 30 days supply | Qty: 9 | Fill #1

## 2017-07-28 MED FILL — CONTRAVE ER 8-90 MG TABLET: 8-90 | 30 days supply | Qty: 120 | Fill #2

## 2017-07-28 MED FILL — CYCLOBENZAPRINE 10 MG TABLE: 10 | 10 days supply | Qty: 30 | Fill #0

## 2017-07-28 MED FILL — VICTOZA 18 MG/3 ML INJECT P: 18 | 30 days supply | Qty: 9 | Fill #2

## 2017-08-03 ENCOUNTER — Encounter: Payer: Self-pay | Admitting: Family Medicine

## 2017-08-04 MED ORDER — INSULIN PEN NEEDLE 32G X 4 MM MISC: 1 refills | Status: DC

## 2017-08-28 ENCOUNTER — Encounter: Payer: Self-pay | Admitting: Family Medicine

## 2017-08-28 MED ORDER — LIRAGLUTIDE 18 MG/3ML ~~LOC~~ SOPN: 1.8000 mg | PEN_INJECTOR | Freq: Every day | SUBCUTANEOUS | 2 refills | Status: DC

## 2017-08-28 MED FILL — ESCITALOPRAM 20 MG TABLET: 20 | 90 days supply | Qty: 90 | Fill #1

## 2017-08-28 MED FILL — ATORVASTATIN 10 MG TABLET: 10 | 90 days supply | Qty: 90 | Fill #1

## 2017-08-28 MED FILL — CYCLOBENZAPRINE 10 MG TABLE: 10 | 10 days supply | Qty: 30 | Fill #1

## 2017-08-29 ENCOUNTER — Ambulatory Visit: Payer: Self-pay | Admitting: Neurology

## 2017-09-20 DIAGNOSIS — Z23 Encounter for immunization: Secondary | ICD-10-CM | POA: Diagnosis not present

## 2017-10-14 ENCOUNTER — Telehealth: Payer: Self-pay

## 2017-10-14 ENCOUNTER — Other Ambulatory Visit: Payer: Self-pay

## 2017-10-14 MED ORDER — NALTREXONE-BUPROPION HCL ER 8-90 MG PO TB12: ORAL_TABLET | ORAL | 6 refills | Status: DC

## 2017-10-14 MED FILL — CYCLOBENZAPRINE 10 MG TAB: 10 | 10 days supply | Qty: 30 | Fill #2

## 2017-10-14 MED FILL — VICTOZA 18 MG/3 ML INJECT P: 18 | 30 days supply | Qty: 9 | Fill #0

## 2017-10-14 NOTE — Telephone Encounter (Signed)
PA initiated via Covermymeds; KEY: [id number]. Awaiting determination.

## 2017-10-17 DIAGNOSIS — E119 Type 2 diabetes mellitus without complications: Secondary | ICD-10-CM | POA: Diagnosis not present

## 2017-10-17 DIAGNOSIS — H524 Presbyopia: Secondary | ICD-10-CM | POA: Diagnosis not present

## 2017-10-17 LAB — HM DIABETES EYE EXAM

## 2017-10-20 NOTE — Telephone Encounter (Signed)
PA approved-effective for a maximum of 12 refills from 10/17/2017 to 10/16/2018.

## 2017-10-28 ENCOUNTER — Encounter: Payer: Self-pay | Admitting: Family Medicine

## 2017-11-03 ENCOUNTER — Telehealth: Payer: 59 | Admitting: Family

## 2017-11-03 ENCOUNTER — Other Ambulatory Visit: Payer: Self-pay | Admitting: Family Medicine

## 2017-11-03 DIAGNOSIS — L700 Acne vulgaris: Secondary | ICD-10-CM

## 2017-11-03 MED ORDER — TRETINOIN 0.025 % EX CREA: TOPICAL_CREAM | Freq: Every day | CUTANEOUS | 0 refills | Status: DC

## 2017-11-03 MED ORDER — MINOCYCLINE HCL 50 MG PO CAPS: 100.0000 mg | ORAL_CAPSULE | Freq: Two times a day (BID) | ORAL | 0 refills | Status: DC

## 2017-11-03 NOTE — Progress Notes (Signed)
Just got off a long phone call with [facility]. They advised that this may be expensive and may require a PA and they will fax to connected care if necessary. They advised to use the cream instead of gel as I am not familiar with the topical form of this medication. Pharmacy advised 20g tube is deal and may or may not cost $73. Will send same info to patient.   Beau FannyWithrow, Ganesh Deeg C, FNP 11/03/2017 11:00 AM

## 2017-11-03 NOTE — Progress Notes (Signed)
Thank you for the details you included in the comment boxes. Those details are very helpful in determining the best course of treatment for you and help us to provide the best care.  We are sorry that you are experiencing this issue.  Here is how we plan to help!  Based on what you shared with me it looks like you have uncomplicated acne.  Acne is a disorder of the hair follicles and oil glands (sebaceous glands). The sebaceous glands secrete oils to keep the skin moist.  When the glands get clogged, it can lead to pimples or cysts.  These cysts may become infected and leave scars. Acne is very common and normally occurs at puberty.  Acne is also inherited.  Your personal care plan consists of the following recommendations:  I recommend that you use a daily cleanser  You might try an over the counter cleanser that has benzoyl peroxide.  I recommend that you start with a product that has 2.5% benzoyl peroxide.  Stronger concentrations have not been shown to be more effective.  I have prescribed a topical gel with an antibiotic:  *I cannot find the cream you are seeking. I will call the pharmacy  I have also prescribed one of the following additional therapies:  Minocycline an oral antibiotic 50 mg twice a day  If excessive dryness or peeling occurs, reduce dose frequency or concentration of the topical scrubs.  If excessive stinging or burning occurs, remove the topical gel with mild soap and water and resume at a lower dose the next day.  Remember oral antibiotics and topical acne treatments may increase your sensitivity to the sun!  HOME CARE:  Do not squeeze pimples because that can often lead to infections, worse acne, and scars.  Use a moisturizer that contains retinoid or fruit acids that may inhibit the development of new acne lesions.  Although there is not a clear link that foods can cause acne, doctors do believe that too many sweets predispose you to skin problems.  GET HELP  RIGHT AWAY IF:  If your acne gets worse or is not better within 10 days.  If you become depressed.  If you become pregnant, discontinue medications and call your OB/GYN.  MAKE SURE YOU:  Understand these instructions.  Will watch your condition.  Will get help right away if you are not doing well or get worse.   Your e-visit answers were reviewed by a board certified advanced clinical practitioner to complete your personal care plan.  Depending upon the condition, your plan could have included both over the counter or prescription medications.  Please review your pharmacy choice.  If there is a problem, you may contact your provider through [employer] messaging and have the prescription routed to another pharmacy.  Your safety is important to us.  If you have drug allergies check your prescription carefully.  For the next 24 hours you can use MyChart to ask questions about today's visit, request a non-urgent call back, or ask for a work or school excuse from your e-visit provider.  You will get an email in the next two days asking about your experience. I hope that your e-visit has been valuable and will speed your recovery.

## 2017-11-03 NOTE — Addendum Note (Signed)
Addended by: Constance HawWITHROW, Natacha Jepsen C on: 11/03/2017 11:02 AM   Modules accepted: Orders

## 2017-11-12 MED FILL — VICTOZA 18 MG/3 ML INJECT P: 18 | 30 days supply | Qty: 9 | Fill #1 | Status: TO

## 2017-11-14 ENCOUNTER — Ambulatory Visit: Payer: 59 | Admitting: Family Medicine

## 2017-11-17 ENCOUNTER — Other Ambulatory Visit: Payer: Self-pay | Admitting: *Deleted

## 2017-11-17 NOTE — Patient Outreach (Signed)
Dr. Dalbert GarnetBeasley transitioned from the Link To Wellness program to the [employer] digital assistant platform on 10/03/17 for Type II diabetes self-management assistance so will close case to the diabetes Link To Wellness program due to delegation of disease management services to [facility] from [employer] [facility] [facility] plan members in 2019. Bary RichardJanet S. Lene Mckay RN,CCM,CDE [facility] Care Management Coordinator Link To Wellness and [employer] Office Phone [phone] Office Fax [phone]

## 2017-12-12 ENCOUNTER — Ambulatory Visit: Payer: 59 | Admitting: Family Medicine

## 2017-12-12 ENCOUNTER — Encounter: Payer: Self-pay | Admitting: Family Medicine

## 2017-12-12 DIAGNOSIS — E782 Mixed hyperlipidemia: Secondary | ICD-10-CM | POA: Diagnosis not present

## 2017-12-12 DIAGNOSIS — G43709 Chronic migraine without aura, not intractable, without status migrainosus: Secondary | ICD-10-CM

## 2017-12-12 DIAGNOSIS — E6609 Other obesity due to excess calories: Secondary | ICD-10-CM | POA: Diagnosis not present

## 2017-12-12 DIAGNOSIS — E669 Obesity, unspecified: Secondary | ICD-10-CM

## 2017-12-12 DIAGNOSIS — E559 Vitamin D deficiency, unspecified: Secondary | ICD-10-CM | POA: Diagnosis not present

## 2017-12-12 DIAGNOSIS — E1169 Type 2 diabetes mellitus with other specified complication: Secondary | ICD-10-CM | POA: Diagnosis not present

## 2017-12-12 LAB — LIPID PANEL
Cholesterol: 148 mg/dL (ref 0–200)
HDL: 55 mg/dL
LDL Cholesterol: 73 mg/dL (ref 0–99)
NonHDL: 92.52
Total CHOL/HDL Ratio: 3
Triglycerides: 100 mg/dL (ref 0.0–149.0)
VLDL: 20 mg/dL (ref 0.0–40.0)

## 2017-12-12 LAB — COMPREHENSIVE METABOLIC PANEL
ALT: 24 U/L (ref 0–35)
AST: 23 U/L (ref 0–37)
Albumin: 4.2 g/dL (ref 3.5–5.2)
Alkaline Phosphatase: 60 U/L (ref 39–117)
BUN: 15 mg/dL (ref 6–23)
CHLORIDE: 102 meq/L (ref 96–112)
CO2: 27 mEq/L (ref 19–32)
Calcium: 9.4 mg/dL (ref 8.4–10.5)
Creatinine, Ser: 0.79 mg/dL (ref 0.40–1.20)
GFR: 82.78 mL/min (ref >60.00)
Glucose, Bld: 77 mg/dL (ref 70–99)
POTASSIUM: 4.6 meq/L (ref 3.5–5.1)
SODIUM: 136 meq/L (ref 135–145)
TOTAL PROTEIN: 7.1 g/dL (ref 6.0–8.3)
Total Bilirubin: 1.9 mg/dL — ABNORMAL HIGH (ref 0.2–1.2)

## 2017-12-12 LAB — VITAMIN D 25 HYDROXY (VIT D DEFICIENCY, FRACTURES): VITD: 32.71 ng/mL (ref 30.00–100.00)

## 2017-12-12 LAB — MICROALBUMIN / CREATININE URINE RATIO
Creatinine,U: 81.2 mg/dL
Microalb Creat Ratio: 0.9 mg/g (ref 0.0–30.0)
Microalb, Ur: <0.7 mg/dL (ref 0.0–1.9)

## 2017-12-12 LAB — TSH: TSH: 4.36 u[IU]/mL (ref 0.35–4.50)

## 2017-12-12 LAB — HEMOGLOBIN A1C: Hgb A1c MFr Bld: 5.3 % (ref 4.6–6.5)

## 2017-12-12 MED ORDER — NALTREXONE-BUPROPION HCL ER 8-90 MG PO TB12: ORAL_TABLET | ORAL | 6 refills | Status: DC

## 2017-12-12 MED FILL — VICTOZA 18 MG/3 ML INJECT P: 18 | 30 days supply | Qty: 9 | Fill #0 | Status: TO

## 2017-12-12 MED FILL — ATORVASTATIN 10 MG TABLET: 10 | 90 days supply | Qty: 90 | Fill #0 | Status: TO

## 2017-12-12 MED FILL — CONTRAVE ER 8-90 MG TABLET: 8-90 | 30 days supply | Qty: 120 | Fill #0

## 2017-12-12 MED FILL — TRETINOIN 0.025% CREAM: 0.025 | 10 days supply | Qty: 20 | Fill #0

## 2017-12-12 MED FILL — ESCITALOPRAM 20 MG TABLET: 20 | 90 days supply | Qty: 90 | Fill #0 | Status: TO

## 2017-12-12 NOTE — Assessment & Plan Note (Signed)
Encouraged heart healthy diet, increase exercise, avoid trans fats, consider a krill oil cap daily 

## 2017-12-12 NOTE — Assessment & Plan Note (Addendum)
Check level today, continue supplementation

## 2017-12-12 NOTE — Progress Notes (Signed)
Subjective:  I acted as a Education administrator for Dr. Charlett Blake. [place], [place]  Patient ID: Amanda James, female    DOB: 1971-05-17, 47 y.o.   MRN: [id number]  No chief complaint on file.   HPI  Patient is in today for a 6 month   Patient Care Team: Mosie Lukes, MD as PCP - General (Family Medicine)   Past Medical History:  Diagnosis Date  . Allergy   . Cervical cancer screening 05/02/2017  . Depression   . Depression with anxiety   . Diabetes mellitus type 2 in obese ([place])   . Diabetes mellitus without complication ([place])   . History of chicken pox   . Hyperlipidemia   . Low back pain   . Migraines   . Obesity 11/10/2016    Past Surgical History:  Procedure Laterality Date  . ABDOMINOPLASTY  2012  . BLADDER SUSPENSION  2016  . LAPAROSCOPIC GASTRIC BANDING  2005   esophageal dilation. band still present but released  . MASTOPEXY  2012    Family History  Problem Relation Age of Onset  . Diabetes Mother   . Hypertension Mother   . Hyperlipidemia Mother   . Obesity Mother   . Heart disease Mother        pacer  . Diabetes Father   . Heart disease Father        MI  . Hyperlipidemia Father   . Hypertension Father   . Obesity Father   . Diabetes Sister   . Hyperlipidemia Sister   . Hypertension Sister   . Obesity Sister   . Obesity Maternal Grandmother   . Hypertension Maternal Grandmother   . Hyperlipidemia Maternal Grandmother   . Diabetes Maternal Grandmother   . Rosacea Maternal Grandmother   . Non-Hodgkin's lymphoma Maternal Grandmother   . Arthritis Maternal Grandmother        spinal stenosis  . Multiple myeloma Maternal Grandfather   . Hyperlipidemia Paternal Grandmother        rhabdo from statins  . Kidney disease Paternal Grandmother   . Migraines Neg Hx     Social History   Socioeconomic History  . Marital status: Married    Spouse name: Not on file  . Number of children: 1  . Years of education: MD  . Highest education level: Not on file  Social  Needs  . Financial resource strain: Not on file  . Food insecurity - worry: Not on file  . Food insecurity - inability: Not on file  . Transportation needs - medical: Not on file  . Transportation needs - non-medical: Not on file  Occupational History  . Occupation: [employer]  Tobacco Use  . Smoking status: Never Smoker  . Smokeless tobacco: Never Used  Substance and Sexual Activity  . Alcohol use: Yes    Alcohol/week: 3.0 oz    Types: 5 Glasses of wine per week    Comment: 3-5 glasses of wine per week  . Drug use: No  . Sexual activity: Not on file  Other Topics Concern  . Not on file  Social History Narrative   Works With [employer], lives with husband, 3 cats   No major dietary restrictions. Exercise 30 minutes 3 x a week   Seat belts routinely   Right-handed   Caffeine: 1-2 cups of coffee per day    Outpatient Medications Prior to Visit  Medication Sig Dispense Refill  . atorvastatin (LIPITOR) 10 MG tablet TAKE 1 TABLET (10 MG TOTAL) BY  MOUTH DAILY. 90 tablet 1  . Cetirizine HCl (ZYRTEC ALLERGY) 10 MG CAPS Take 1 capsule by mouth daily.    . cyclobenzaprine (FLEXERIL) 10 MG tablet Take 1 tablet (10 mg total) by mouth 3 (three) times daily as needed for muscle spasms. 30 tablet 5  . escitalopram (LEXAPRO) 20 MG tablet TAKE 1 TABLET (20 MG TOTAL) BY MOUTH DAILY. 90 tablet 1  . fluticasone (FLONASE) 50 MCG/ACT nasal spray Place 1 spray into both nostrils 2 (two) times daily.    . Insulin Pen Needle (BD PEN NEEDLE NANO U/F) 32G X 4 MM MISC To use w/ Victoza 100 each 1  . liraglutide (VICTOZA) 18 MG/3ML SOPN Inject 0.3 mLs (1.8 mg total) into the skin daily. GI upset with metformin, had hypoglycemia with glipizide, and was not well controlled on Januvia or Bydureon) 3 mL 2  . tretinoin (RETIN-A) 0.025 % cream Apply topically at bedtime. To affected areas 20 g 0  . ciprofloxacin (CIPRO) 500 MG tablet Take 1 tablet (500 mg total) by mouth 2 (two) times daily. 10 tablet 0  .  minocycline (MINOCIN,DYNACIN) 50 MG capsule Take 2 capsules (100 mg total) by mouth 2 (two) times daily. 60 capsule 0  . Naltrexone-Bupropion HCl ER (CONTRAVE) 8-90 MG TB12 Take 2 by mouth twice daily 120 tablet 6  . SUMAtriptan (IMITREX) 100 MG tablet Take 1 tablet (100 mg total) by mouth once as needed. May repeat in 2 hours if headache persists or recurs. 10 tablet 12   No facility-administered medications prior to visit.     Allergies  Allergen Reactions  . Erythromycin Diarrhea    Review of Systems  Constitutional: Negative for fever and malaise/fatigue.  HENT: Negative for congestion.   Eyes: Negative for blurred vision.  Respiratory: Negative for shortness of breath.   Cardiovascular: Negative for chest pain, palpitations and leg swelling.  Gastrointestinal: Negative for abdominal pain, blood in stool and nausea.  Genitourinary: Negative for dysuria and frequency.  Musculoskeletal: Negative for falls.  Skin: Negative for rash.  Neurological: Negative for dizziness, loss of consciousness and headaches.  Endo/Heme/Allergies: Negative for environmental allergies.  Psychiatric/Behavioral: Negative for depression. The patient is not nervous/anxious.        Objective:    Physical Exam  Constitutional: She is oriented to person, place, and time. She appears well-developed and well-nourished. No distress.  HENT:  Head: Normocephalic and atraumatic.  Nose: Nose normal.  Eyes: Right eye exhibits no discharge. Left eye exhibits no discharge.  Neck: Normal range of motion. Neck supple.  Cardiovascular: Normal rate and regular rhythm.  No murmur heard. Pulmonary/Chest: Effort normal and breath sounds normal.  Abdominal: Soft. Bowel sounds are normal. There is no tenderness.  Musculoskeletal: She exhibits no edema.  Neurological: She is alert and oriented to person, place, and time.  Skin: Skin is warm and dry.  Psychiatric: She has a normal mood and affect.  Nursing note and  vitals reviewed.   BP 114/86 (BP Location: Left Arm, Patient Position: Sitting, Cuff Size: Normal)   Pulse 68   Temp 98.6 F (37 C) (Oral)   Resp 18   Wt 214 lb 12.8 oz (97.4 kg)   SpO2 100%   BMI 34.67 kg/m  Wt Readings from Last 3 Encounters:  12/12/17 214 lb 12.8 oz (97.4 kg)  05/02/17 208 lb (94.3 kg)  04/04/17 210 lb 9.6 oz (95.5 kg)   BP Readings from Last 3 Encounters:  12/12/17 114/86  05/02/17 98/72  04/04/17 120/81  Immunization History  Administered Date(s) Administered  . Influenza-Unspecified 09/05/2016, 09/20/2017  . Pneumococcal Conjugate-13 12/09/2013  . Pneumococcal Polysaccharide-23 12/09/2013  . Tdap 09/05/2016    Health Maintenance  Topic Date Due  . HIV Screening  07/27/1985  . FOOT EXAM  05/02/2018  . HEMOGLOBIN A1C  06/11/2018  . OPHTHALMOLOGY EXAM  10/17/2018  . PNEUMOCOCCAL POLYSACCHARIDE VACCINE (2) 12/09/2018  . URINE MICROALBUMIN  12/12/2018  . PAP SMEAR  05/02/2020  . TETANUS/TDAP  09/05/2026  . INFLUENZA VACCINE  Completed    Lab Results  Component Value Date   WBC 8.5 05/02/2017   HGB 13.1 05/02/2017   HCT 38.8 05/02/2017   PLT 333.0 05/02/2017   GLUCOSE 77 12/12/2017   CHOL 148 12/12/2017   TRIG 100.0 12/12/2017   HDL 55.00 12/12/2017   LDLCALC 73 12/12/2017   ALT 24 12/12/2017   AST 23 12/12/2017   NA 136 12/12/2017   K 4.6 12/12/2017   CL 102 12/12/2017   CREATININE 0.79 12/12/2017   BUN 15 12/12/2017   CO2 27 12/12/2017   TSH 4.36 12/12/2017   INR 1.04 09/17/2016   HGBA1C 5.3 12/12/2017   MICROALBUR <0.7 12/12/2017    Lab Results  Component Value Date   TSH 4.36 12/12/2017   Lab Results  Component Value Date   WBC 8.5 05/02/2017   HGB 13.1 05/02/2017   HCT 38.8 05/02/2017   MCV 96.6 05/02/2017   PLT 333.0 05/02/2017   Lab Results  Component Value Date   NA 136 12/12/2017   K 4.6 12/12/2017   CO2 27 12/12/2017   GLUCOSE 77 12/12/2017   BUN 15 12/12/2017   CREATININE 0.79 12/12/2017   BILITOT  1.9 (H) 12/12/2017   ALKPHOS 60 12/12/2017   AST 23 12/12/2017   ALT 24 12/12/2017   PROT 7.1 12/12/2017   ALBUMIN 4.2 12/12/2017   CALCIUM 9.4 12/12/2017   ANIONGAP 5 09/17/2016   GFR 82.78 12/12/2017   Lab Results  Component Value Date   CHOL 148 12/12/2017   Lab Results  Component Value Date   HDL 55.00 12/12/2017   Lab Results  Component Value Date   LDLCALC 73 12/12/2017   Lab Results  Component Value Date   TRIG 100.0 12/12/2017   Lab Results  Component Value Date   CHOLHDL 3 12/12/2017   Lab Results  Component Value Date   HGBA1C 5.3 12/12/2017         Assessment & Plan:   Problem List Items Addressed This Visit    Diabetes mellitus type 2 in obese ([place])    hgba1c acceptable, minimize simple carbs. Increase exercise as tolerated.       Relevant Orders   TSH (Completed)   Hemoglobin A1c (Completed)   Microalbumin / creatinine urine ratio (Completed)   Hyperlipidemia    Encouraged heart healthy diet, increase exercise, avoid trans fats, consider a krill oil cap daily      Relevant Orders   Lipid panel (Completed)   Obesity    Continues to do well with weight management.      Relevant Medications   Naltrexone-Bupropion HCl ER (CONTRAVE) 8-90 MG TB12   Vitamin D deficiency    Check level today, continue supplementation      Relevant Orders   Comprehensive metabolic panel (Completed)   VITAMIN D 25 Hydroxy (Vit-D Deficiency, Fractures) (Completed)   Chronic migraine w/o aura w/o status migrainosus, not intractable    Is following with Dr Jaynee Eagles at [facility] and has done  well with occipital Botox injections.          I have discontinued Dr. Leanne Chang Orner's SUMAtriptan, ciprofloxacin, and minocycline. I am also having her maintain her fluticasone, Cetirizine HCl, cyclobenzaprine, Insulin Pen Needle, liraglutide, tretinoin, escitalopram, atorvastatin, and Naltrexone-Bupropion HCl ER.  Meds ordered this encounter  Medications  .  Naltrexone-Bupropion HCl ER (CONTRAVE) 8-90 MG TB12    Sig: Take 2 by mouth twice daily    Dispense:  120 tablet    Refill:  6    CMA served as scribe during this visit. History, Physical and Plan performed by medical provider. Documentation and orders reviewed and attested to.  Penni Homans, MD

## 2017-12-12 NOTE — Assessment & Plan Note (Signed)
hgba1c acceptable, minimize simple carbs. Increase exercise as tolerated.  

## 2017-12-12 NOTE — Patient Instructions (Signed)

## 2017-12-14 NOTE — Assessment & Plan Note (Signed)
Continues to do well with weight management.

## 2017-12-14 NOTE — Assessment & Plan Note (Signed)
[facility] and has done well with occipital Botox injections.

## 2018-02-25 ENCOUNTER — Other Ambulatory Visit: Payer: Self-pay | Admitting: Family Medicine

## 2018-02-25 ENCOUNTER — Encounter: Payer: Self-pay | Admitting: Family Medicine

## 2018-02-25 MED FILL — VICTOZA 18 MG/3 ML INJECT P: 18 | 30 days supply | Qty: 9 | Fill #0

## 2018-02-25 MED FILL — CYCLOBENZAPRINE 10 MG TAB: 10 | 10 days supply | Qty: 30 | Fill #3

## 2018-02-25 NOTE — Telephone Encounter (Signed)
Refills have been sent to pharmacy for patient.

## 2018-03-04 ENCOUNTER — Other Ambulatory Visit: Payer: Self-pay | Admitting: Neurology

## 2018-03-04 MED ORDER — FREMANEZUMAB-VFRM 225 MG/1.5ML ~~LOC~~ SOSY: 675.0000 mg | PREFILLED_SYRINGE | SUBCUTANEOUS | 4 refills | Status: DC

## 2018-03-04 NOTE — Progress Notes (Signed)
ajovy 225

## 2018-03-05 ENCOUNTER — Telehealth: Payer: Self-pay

## 2018-03-05 NOTE — Telephone Encounter (Signed)
We received a prior authorization request for this medication. I have completed and submitted the PA on Cover My Meds and should have a determination within 48-72 hours.  Cover My Meds Key: [id number]

## 2018-03-10 NOTE — Telephone Encounter (Signed)
Yes patient was given a copay card, I actually dropped it off at the pharmacy with the script thanks

## 2018-03-10 NOTE — Telephone Encounter (Signed)
DENIED  This request has not been approved. Based on the information submitted for review, you did not meet our guideline rules for the requested drug. In order for your request to be approved, your provider would need to show that you have met the guideline rules below. For chronic migraines, the guideline named FREMANEZUMAB-VFRM (Ajovy) requires a previous trial of any TWO of the following preventative migraine treatments: Valproic acid/divalproex sodium, topiramate, propranolol, timolol, amitriptyline, venlafaxine, atenolol, nadolol, lisinopril, candesartan, clonidine, guanfacine, carbamazepine, nebivolol, pindolol, cyproheptadine, or Botox. Your doctor told us that you have tried topiramate. This request was denied because you have not tried a second preventive medication from the list above. Please work with your doctor to use a different medication or to provide more information if it will allow [place] to approve the request. A written notification letter will follow with additional details.

## 2018-03-18 NOTE — Telephone Encounter (Signed)
[facility]. Spoke with staff @ the pharmacy and informed them that a copay card had been hand delivered by the doctor for override of the PA denial that was already received. Victorino DikeJennifer confirmed that the copay card was accepted and they would fill the Ajovy for the patient. However she only has 8 days to pick it up before it's returned to stock.   Called pt LVM (ok per DPR) informing her of the above information including denial of Ajovy however copay card allows her to get it free through the end of the year. Also per pharmacy, she has 8 days to pick up before it is returned to stock. Left office number and encouraged pt to call with any questions.

## 2018-04-22 MED FILL — AJOVY 225 MG/1.5ML SOSY: 225 | 90 days supply | Qty: 5 | Fill #0

## 2018-04-22 MED FILL — VICTOZA 18 MG/3 ML INJECT P: 18 | 30 days supply | Qty: 9 | Fill #1

## 2018-04-22 MED FILL — ATORVASTATIN 10 MG TABLET: 10 | 90 days supply | Qty: 90 | Fill #0

## 2018-04-22 MED FILL — ESCITALOPRAM 20 MG TABLET: 20 | 90 days supply | Qty: 90 | Fill #0

## 2018-05-07 NOTE — Telephone Encounter (Signed)
Received a new PA for Ajovy 675 mg every 90 days through [employer] on Cover My Meds. PA sent Key: [id number].

## 2018-05-11 NOTE — Telephone Encounter (Signed)
PA for Ajovy denied due to insurance requirement of trial/failure of Aimovig and Emgality. Dr. Lucia GaskinsAhern aware and stated that patient has the copay card and she will be talking to her about this.

## 2018-05-20 ENCOUNTER — Telehealth (INDEPENDENT_AMBULATORY_CARE_PROVIDER_SITE_OTHER): Payer: Self-pay | Admitting: Family Medicine

## 2018-05-20 NOTE — Telephone Encounter (Signed)
Hello

## 2018-06-16 ENCOUNTER — Other Ambulatory Visit: Payer: Self-pay | Admitting: Family Medicine

## 2018-06-16 ENCOUNTER — Encounter: Payer: Self-pay | Admitting: Family Medicine

## 2018-06-16 MED ORDER — INSULIN PEN NEEDLE 32G X 4 MM MISC: 1 refills | Status: DC

## 2018-06-16 MED ORDER — LIRAGLUTIDE 18 MG/3ML ~~LOC~~ SOPN: PEN_INJECTOR | SUBCUTANEOUS | 1 refills | Status: DC

## 2018-06-16 MED FILL — UNIFINE PENTIPS 32GX5/32: 32G X 4 MM | 90 days supply | Qty: 100 | Fill #0

## 2018-06-16 MED FILL — UNIFINE PENTIPS 32GX5/32": 32G X 4 MM | 90 days supply | Qty: 100 | Fill #0

## 2018-06-16 MED FILL — VICTOZA 18 MG/3 ML INJECT P: 18 | 60 days supply | Qty: 18 | Fill #0

## 2018-06-26 ENCOUNTER — Encounter: Payer: Self-pay | Admitting: Family Medicine

## 2018-06-26 ENCOUNTER — Ambulatory Visit (INDEPENDENT_AMBULATORY_CARE_PROVIDER_SITE_OTHER): Payer: 59 | Admitting: Family Medicine

## 2018-06-26 ENCOUNTER — Telehealth: Payer: Self-pay

## 2018-06-26 VITALS — BP 100/78 | HR 70 | Temp 98.3°F | Resp 18 | Ht 66.0 in | Wt 216.6 lb

## 2018-06-26 DIAGNOSIS — G43709 Chronic migraine without aura, not intractable, without status migrainosus: Secondary | ICD-10-CM | POA: Diagnosis not present

## 2018-06-26 DIAGNOSIS — G8929 Other chronic pain: Secondary | ICD-10-CM | POA: Diagnosis not present

## 2018-06-26 DIAGNOSIS — M545 Low back pain: Secondary | ICD-10-CM | POA: Diagnosis not present

## 2018-06-26 DIAGNOSIS — E559 Vitamin D deficiency, unspecified: Secondary | ICD-10-CM | POA: Diagnosis not present

## 2018-06-26 DIAGNOSIS — Z1231 Encounter for screening mammogram for malignant neoplasm of breast: Secondary | ICD-10-CM | POA: Diagnosis not present

## 2018-06-26 DIAGNOSIS — E782 Mixed hyperlipidemia: Secondary | ICD-10-CM | POA: Diagnosis not present

## 2018-06-26 DIAGNOSIS — E669 Obesity, unspecified: Secondary | ICD-10-CM | POA: Diagnosis not present

## 2018-06-26 DIAGNOSIS — E6609 Other obesity due to excess calories: Secondary | ICD-10-CM

## 2018-06-26 DIAGNOSIS — E1169 Type 2 diabetes mellitus with other specified complication: Secondary | ICD-10-CM

## 2018-06-26 DIAGNOSIS — Z1239 Encounter for other screening for malignant neoplasm of breast: Secondary | ICD-10-CM

## 2018-06-26 DIAGNOSIS — Z0000 Encounter for general adult medical examination without abnormal findings: Secondary | ICD-10-CM

## 2018-06-26 LAB — COMPREHENSIVE METABOLIC PANEL
ALT: 29 U/L (ref 0–35)
AST: 23 U/L (ref 0–37)
Albumin: 4.1 g/dL (ref 3.5–5.2)
Alkaline Phosphatase: 64 U/L (ref 39–117)
BUN: 15 mg/dL (ref 6–23)
CALCIUM: 9.1 mg/dL (ref 8.4–10.5)
CHLORIDE: 101 meq/L (ref 96–112)
CO2: 28 meq/L (ref 19–32)
CREATININE: 0.88 mg/dL (ref 0.40–1.20)
GFR: 72.92 mL/min (ref >60.00)
Glucose, Bld: 87 mg/dL (ref 70–99)
Potassium: 4.3 mEq/L (ref 3.5–5.1)
SODIUM: 135 meq/L (ref 135–145)
Total Bilirubin: 1.3 mg/dL — ABNORMAL HIGH (ref 0.2–1.2)
Total Protein: 7.1 g/dL (ref 6.0–8.3)

## 2018-06-26 LAB — LIPID PANEL
CHOL/HDL RATIO: 3
Cholesterol: 159 mg/dL (ref 0–200)
HDL: 51.3 mg/dL (ref >39.00)
LDL CALC: 90 mg/dL (ref 0–99)
NonHDL: 107.74
TRIGLYCERIDES: 87 mg/dL (ref 0.0–149.0)
VLDL: 17.4 mg/dL (ref 0.0–40.0)

## 2018-06-26 LAB — CBC
HCT: 40.6 % (ref 36.0–46.0)
Hemoglobin: 13.8 g/dL (ref 12.0–15.0)
MCHC: 34.1 g/dL (ref 30.0–36.0)
MCV: 96.9 fl (ref 78.0–100.0)
Platelets: 296 10*3/uL (ref 150.0–400.0)
RBC: 4.19 Mil/uL (ref 3.87–5.11)
RDW: 13.6 % (ref 11.5–15.5)
WBC: 8.1 10*3/uL (ref 4.0–10.5)

## 2018-06-26 LAB — TSH: TSH: 3.46 u[IU]/mL (ref 0.35–4.50)

## 2018-06-26 LAB — VITAMIN D 25 HYDROXY (VIT D DEFICIENCY, FRACTURES): VITD: 29.96 ng/mL — AB (ref 30.00–100.00)

## 2018-06-26 LAB — HEMOGLOBIN A1C: Hgb A1c MFr Bld: 5.5 % (ref 4.6–6.5)

## 2018-06-26 MED ORDER — LIRAGLUTIDE -WEIGHT MANAGEMENT 18 MG/3ML ~~LOC~~ SOPN: 3.0000 mg | PEN_INJECTOR | Freq: Every day | SUBCUTANEOUS | 5 refills | Status: DC

## 2018-06-26 MED ORDER — CYCLOBENZAPRINE HCL 10 MG PO TABS: 10.0000 mg | ORAL_TABLET | Freq: Three times a day (TID) | ORAL | 2 refills | Status: DC | PRN

## 2018-06-26 NOTE — Assessment & Plan Note (Signed)
Supplement and monitor 

## 2018-06-26 NOTE — Patient Instructions (Addendum)
Consider [facility] or [facility]  [place]  Need contact information for [facility] Preventive Care 40-64 Years, Female Preventive care refers to lifestyle choices and visits with your health care provider that can promote health and wellness. What does preventive care include?  A yearly physical exam. This is also called an annual well check.  Dental exams once or twice a year.  Routine eye exams. Ask your health care provider how often you should have your eyes checked.  Personal lifestyle choices, including: ? Daily care of your teeth and gums. ? Regular physical activity. ? Eating a healthy diet. ? Avoiding tobacco and drug use. ? Limiting alcohol use. ? Practicing safe sex. ? Taking low-dose aspirin daily starting at age 50. ? Taking vitamin and mineral supplements as recommended by your health care provider. What happens during an annual well check? The services and screenings done by your health care provider during your annual well check will depend on your age, overall health, lifestyle risk factors, and family history of disease. Counseling Your health care provider may ask you questions about your:  Alcohol use.  Tobacco use.  Drug use.  Emotional well-being.  Home and relationship well-being.  Sexual activity.  Eating habits.  Work and work environment.  Method of birth control.  Menstrual cycle.  Pregnancy history.  Screening You may have the following tests or measurements:  Height, weight, and BMI.  Blood pressure.  Lipid and cholesterol levels. These may be checked every 5 years, or more frequently if you are over 50 years old.  Skin check.  Lung cancer screening. You may have this screening every year starting at age 55 if you have a 30-pack-year history of smoking and currently smoke or have quit within the past 15 years.  Fecal occult blood test (FOBT) of the stool. You may  have this test every year starting at age 50.  Flexible sigmoidoscopy or colonoscopy. You may have a sigmoidoscopy every 5 years or a colonoscopy every 10 years starting at age 50.  Hepatitis C blood test.  Hepatitis B blood test.  Sexually transmitted disease (STD) testing.  Diabetes screening. This is done by checking your blood sugar (glucose) after you have not eaten for a while (fasting). You may have this done every 1-3 years.  Mammogram. This may be done every 1-2 years. Talk to your health care provider about when you should start having regular mammograms. This may depend on whether you have a family history of breast cancer.  BRCA-related cancer screening. This may be done if you have a family history of breast, ovarian, tubal, or peritoneal cancers.  Pelvic exam and Pap test. This may be done every 3 years starting at age 21. Starting at age 30, this may be done every 5 years if you have a Pap test in combination with an HPV test.  Bone density scan. This is done to screen for osteoporosis. You may have this scan if you are at high risk for osteoporosis.  Discuss your test results, treatment options, and if necessary, the need for more tests with your health care provider. Vaccines Your health care provider may recommend certain vaccines, such as:  Influenza vaccine. This is recommended every year.  Tetanus, diphtheria, and acellular pertussis (Tdap, Td) vaccine. You may need a Td booster every 10 years.  Varicella vaccine. You may need this if you have not been vaccinated.  Zoster vaccine. You may need this after age 60.    Measles, mumps, and rubella (MMR) vaccine. You may need at least one dose of MMR if you were born in 1957 or later. You may also need a second dose.  Pneumococcal 13-valent conjugate (PCV13) vaccine. You may need this if you have certain conditions and were not previously vaccinated.  Pneumococcal polysaccharide (PPSV23) vaccine. You may need one or  two doses if you smoke cigarettes or if you have certain conditions.  Meningococcal vaccine. You may need this if you have certain conditions.  Hepatitis A vaccine. You may need this if you have certain conditions or if you travel or work in places where you may be exposed to hepatitis A.  Hepatitis B vaccine. You may need this if you have certain conditions or if you travel or work in places where you may be exposed to hepatitis B.  Haemophilus influenzae type b (Hib) vaccine. You may need this if you have certain conditions.  Talk to your health care provider about which screenings and vaccines you need and how often you need them. This information is not intended to replace advice given to you by your health care provider. Make sure you discuss any questions you have with your health care provider. Document Released: 12/22/2015 Document Revised: 08/14/2016 Document Reviewed: 09/26/2015 Elsevier Interactive Patient Education  [employer].

## 2018-06-26 NOTE — Assessment & Plan Note (Addendum)
Encouraged DASH diet, decrease po intake and increase exercise as tolerated. Needs 7-8 hours of sleep nightly. Avoid trans fats, eat small, frequent meals every 4-5 hours with lean proteins, complex carbs and healthy fats. Minimize simple carbs. We will attempt a course of Saxenda to see if that helps he weight maintenance.

## 2018-06-26 NOTE — Telephone Encounter (Signed)
PA initiated via [employer]eds; KEY: [id number]. Awaiting determination.

## 2018-06-26 NOTE — Progress Notes (Signed)
Subjective:  I acted as a Education administrator for Dr. Charlett Blake. [place], [place]  Patient ID: Amanda James, female    DOB: [birth year], 47 y.o.   MRN: [id number]  Chief Complaint  Patient presents with  . Annual Exam    HPI  Patient is in today for annual preventative exam and follow up on chronic medical concerns including hyperlipidemia, diabetes mellitus, elevated BMI and vitamin D deficiency. She is frustrated with her weight creeping up more and more. She is considering a change in medications. She struggles with stress and long hours at work. She is doing well with activities of daily living and maintains a heart healthy diet most days. No polyuria or polydipsia. FSG is good. Denies CP/palp/SOB/HA/congestion/fevers/GI or GU c/o. Taking meds as prescribed  Patient Care Team: Mosie Lukes, MD as PCP - General (Family Medicine)   Past Medical History:  Diagnosis Date  . Allergy   . Cervical cancer screening 05/02/2017  . Depression   . Depression with anxiety   . Diabetes mellitus type 2 in obese ([place])   . Diabetes mellitus without complication ([place])   . History of chicken pox   . Hyperlipidemia   . Low back pain   . Migraines   . Obesity 11/10/2016    Past Surgical History:  Procedure Laterality Date  . ABDOMINOPLASTY  2012  . BLADDER SUSPENSION  2016  . LAPAROSCOPIC GASTRIC BANDING  2005   esophageal dilation. band still present but released  . MASTOPEXY  2012    Family History  Problem Relation Age of Onset  . Diabetes Mother   . Hypertension Mother   . Hyperlipidemia Mother   . Obesity Mother   . Heart disease Mother        pacer  . Diabetes Father   . Heart disease Father        MI  . Hyperlipidemia Father   . Hypertension Father   . Obesity Father   . Diabetes Sister   . Hyperlipidemia Sister   . Hypertension Sister   . Obesity Sister   . Obesity Maternal Grandmother   . Hypertension Maternal Grandmother   . Hyperlipidemia Maternal Grandmother   . Diabetes  Maternal Grandmother   . Rosacea Maternal Grandmother   . Non-Hodgkin's lymphoma Maternal Grandmother   . Arthritis Maternal Grandmother        spinal stenosis  . Multiple myeloma Maternal Grandfather   . Hyperlipidemia Paternal Grandmother        rhabdo from statins  . Kidney disease Paternal Grandmother   . Migraines Neg Hx     Social History   Socioeconomic History  . Marital status: Married    Spouse name: Not on file  . Number of children: 1  . Years of education: MD  . Highest education level: Not on file  Occupational History  . Occupation: [employer]  . Financial resource strain: Not on file  . Food insecurity:    Worry: Not on file    Inability: Not on file  . Transportation needs:    Medical: Not on file    Non-medical: Not on file  Tobacco Use  . Smoking status: Never Smoker  . Smokeless tobacco: Never Used  Substance and Sexual Activity  . Alcohol use: Yes    Alcohol/week: 3.0 oz    Types: 5 Glasses of wine per week    Comment: 3-5 glasses of wine per week  . Drug use: No  . Sexual activity: Not  on file  Lifestyle  . Physical activity:    Days per week: Not on file    Minutes per session: Not on file  . Stress: Not on file  Relationships  . Social connections:    Talks on phone: Not on file    Gets together: Not on file    Attends religious service: Not on file    Active member of club or organization: Not on file    Attends meetings of clubs or organizations: Not on file    Relationship status: Not on file  . Intimate partner violence:    Fear of current or ex partner: Not on file    Emotionally abused: Not on file    Physically abused: Not on file    Forced sexual activity: Not on file  Other Topics Concern  . Not on file  Social History Narrative   Works With [employer], lives with husband, 3 cats   No major dietary restrictions. Exercise 30 minutes 3 x a week   Seat belts routinely   Right-handed   Caffeine: 1-2 cups of coffee  per day    Outpatient Medications Prior to Visit  Medication Sig Dispense Refill  . atorvastatin (LIPITOR) 10 MG tablet TAKE 1 TABLET (10 MG TOTAL) BY MOUTH DAILY. 90 tablet 1  . Cetirizine HCl (ZYRTEC ALLERGY) 10 MG CAPS Take 1 capsule by mouth daily.    [name] escitalopram (LEXAPRO) 20 MG tablet TAKE 1 TABLET (20 MG TOTAL) BY MOUTH DAILY. 90 tablet 1  . fluticasone (FLONASE) 50 MCG/ACT nasal spray Place 1 spray into both nostrils 2 (two) times daily.    . Fremanezumab-vfrm (AJOVY) 225 MG/1.5ML SOSY Inject 675 mg into the skin every 3 (three) months. 3 Syringe 4  . Insulin Pen Needle (BD PEN NEEDLE NANO U/F) 32G X 4 MM MISC To use w/ Victoza 100 each 1  . tretinoin (RETIN-A) 0.025 % cream Apply topically at bedtime. To affected areas 20 g 0  . cyclobenzaprine (FLEXERIL) 10 MG tablet Take 1 tablet (10 mg total) by mouth 3 (three) times daily as needed for muscle spasms. 30 tablet 5  . liraglutide (VICTOZA) 18 MG/3ML SOPN INJECT 0.3 MLS (1.8 MG TOTAL) INTO THE SKIN DAILY 9 mL 1  . Naltrexone-Bupropion HCl ER (CONTRAVE) 8-90 MG TB12 Take 2 by mouth twice daily 120 tablet 6  . VICTOZA 18 MG/3ML SOPN INJECT 0.3 MLS (1.8 MG TOTAL) INTO THE SKIN DAILY 9 mL 1   No facility-administered medications prior to visit.     Allergies  Allergen Reactions  . Erythromycin Diarrhea    Review of Systems  Constitutional: Negative for chills, fever and malaise/fatigue.  HENT: Negative for congestion and hearing loss.   Eyes: Negative for discharge.  Respiratory: Negative for cough, sputum production and shortness of breath.   Cardiovascular: Negative for chest pain, palpitations and leg swelling.  Gastrointestinal: Negative for abdominal pain, blood in stool, constipation, diarrhea, heartburn, nausea and vomiting.  Genitourinary: Negative for dysuria, frequency, hematuria and urgency.  Musculoskeletal: Positive for back pain. Negative for falls and myalgias.  Skin: Negative for rash.  Neurological: Negative  for dizziness, sensory change, loss of consciousness, weakness and headaches.  Endo/Heme/Allergies: Negative for environmental allergies. Does not bruise/bleed easily.  Psychiatric/Behavioral: Negative for depression and suicidal ideas. The patient is not nervous/anxious and does not have insomnia.        Objective:    Physical Exam  Constitutional: She is oriented to person, place, and time. She appears  well-developed and well-nourished. No distress.  HENT:  Head: Normocephalic and atraumatic.  Eyes: Conjunctivae are normal.  Neck: Neck supple. No thyromegaly present.  Cardiovascular: Normal rate, regular rhythm and normal heart sounds.  No murmur heard. Pulmonary/Chest: Effort normal and breath sounds normal. No respiratory distress.  Abdominal: Soft. Bowel sounds are normal. She exhibits no distension and no mass. There is no tenderness.  Musculoskeletal: She exhibits no edema.  Lymphadenopathy:    She has no cervical adenopathy.  Neurological: She is alert and oriented to person, place, and time.  Skin: Skin is warm and dry.  Psychiatric: She has a normal mood and affect. Her behavior is normal.    BP 100/78 (BP Location: Left Arm, Patient Position: Sitting, Cuff Size: Normal)   Pulse 70   Temp 98.3 F (36.8 C) (Oral)   Resp 18   Ht _0  (1.676 m)   Wt 216 lb 9.6 oz (98.2 kg)   SpO2 98%   BMI 34.96 kg/m  Wt Readings from Last 3 Encounters:  06/26/18 216 lb 9.6 oz (98.2 kg)  12/12/17 214 lb 12.8 oz (97.4 kg)  05/02/17 208 lb (94.3 kg)   BP Readings from Last 3 Encounters:  06/26/18 100/78  12/12/17 114/86  05/02/17 98/72     Immunization History  Administered Date(s) Administered  . Influenza-Unspecified 09/05/2016, 09/20/2017  . Pneumococcal Conjugate-13 12/09/2013  . Pneumococcal Polysaccharide-23 12/09/2013  . Tdap 09/05/2016    Health Maintenance  Topic Date Due  . HIV Screening  07/27/1985  . FOOT EXAM  05/02/2018  . INFLUENZA VACCINE  07/09/2018    . OPHTHALMOLOGY EXAM  10/17/2018  . PNEUMOCOCCAL POLYSACCHARIDE VACCINE (2) 12/09/2018  . URINE MICROALBUMIN  12/12/2018  . HEMOGLOBIN A1C  12/27/2018  . PAP SMEAR  05/02/2020  . TETANUS/TDAP  09/05/2026    Lab Results  Component Value Date   WBC 8.1 06/26/2018   HGB 13.8 06/26/2018   HCT 40.6 06/26/2018   PLT 296.0 06/26/2018   GLUCOSE 87 06/26/2018   CHOL 159 06/26/2018   TRIG 87.0 06/26/2018   HDL 51.30 06/26/2018   LDLCALC 90 06/26/2018   ALT 29 06/26/2018   AST 23 06/26/2018   NA 135 06/26/2018   K 4.3 06/26/2018   CL 101 06/26/2018   CREATININE 0.88 06/26/2018   BUN 15 06/26/2018   CO2 28 06/26/2018   TSH 3.46 06/26/2018   INR 1.04 09/17/2016   HGBA1C 5.5 06/26/2018   MICROALBUR <0.7 12/12/2017    Lab Results  Component Value Date   TSH 3.46 06/26/2018   Lab Results  Component Value Date   WBC 8.1 06/26/2018   HGB 13.8 06/26/2018   HCT 40.6 06/26/2018   MCV 96.9 06/26/2018   PLT 296.0 06/26/2018   Lab Results  Component Value Date   NA 135 06/26/2018   K 4.3 06/26/2018   CO2 28 06/26/2018   GLUCOSE 87 06/26/2018   BUN 15 06/26/2018   CREATININE 0.88 06/26/2018   BILITOT 1.3 (H) 06/26/2018   ALKPHOS 64 06/26/2018   AST 23 06/26/2018   ALT 29 06/26/2018   PROT 7.1 06/26/2018   ALBUMIN 4.1 06/26/2018   CALCIUM 9.1 06/26/2018   ANIONGAP 5 09/17/2016   GFR 72.92 06/26/2018   Lab Results  Component Value Date   CHOL 159 06/26/2018   Lab Results  Component Value Date   HDL 51.30 06/26/2018   Lab Results  Component Value Date   LDLCALC 90 06/26/2018   Lab Results  Component  Value Date   TRIG 87.0 06/26/2018   Lab Results  Component Value Date   CHOLHDL 3 06/26/2018   Lab Results  Component Value Date   HGBA1C 5.5 06/26/2018         Assessment & Plan:   Problem List Items Addressed This Visit    Diabetes mellitus type 2 in obese ([place])    hgba1c acceptable, minimize simple carbs. Increase exercise as tolerated. Continue  current meds      Relevant Orders   Hemoglobin A1c (Completed)   Comprehensive metabolic panel (Completed)   Hyperlipidemia    Encouraged heart healthy diet, increase exercise, avoid trans fats, consider a krill oil cap daily      Obesity    Encouraged DASH diet, decrease po intake and increase exercise as tolerated. Needs 7-8 hours of sleep nightly. Avoid trans fats, eat small, frequent meals every 4-5 hours with lean proteins, complex carbs and healthy fats. Minimize simple carbs. We will attempt a course of Saxenda to see if that helps he weight maintenance.       Relevant Medications   Liraglutide -Weight Management (SAXENDA) 18 MG/3ML SOPN   Vitamin D deficiency    Supplement and monitor      Relevant Orders   VITAMIN D 25 Hydroxy (Vit-D Deficiency, Fractures) (Completed)   Preventative health care - Primary    Patient encouraged to maintain heart healthy diet, regular exercise, adequate sleep. Consider daily probiotics. Take medications as prescribed. MGM ordered. Labs ordered and reviewed. Pap up to date.       Relevant Orders   CBC (Completed)   Lipid panel (Completed)   TSH (Completed)   Chronic migraine w/o aura w/o status migrainosus, not intractable    Following with D Ahern at [facility] and doing well on Ajovy and with Botox injections at times.      Relevant Medications   cyclobenzaprine (FLEXERIL) 10 MG tablet   Chronic bilateral low back pain without sciatica    Encouraged moist heat and gentle stretching as tolerated. May try NSAIDs and prescription meds as directed and report if symptoms worsen or seek immediate care. Given refill on cyclobenzaprine to use prn      Relevant Medications   cyclobenzaprine (FLEXERIL) 10 MG tablet    Other Visit Diagnoses    Breast cancer screening       Relevant Orders   MM 3D SCREEN BREAST W/IMPLANT BILATERAL      I have discontinued Dr. Leanne Chang Agrawal's Naltrexone-buPROPion HCl ER, VICTOZA, and liraglutide. I am also having  her start on Liraglutide -Weight Management. Additionally, I am having her maintain her fluticasone, Cetirizine HCl, tretinoin, escitalopram, atorvastatin, Fremanezumab-vfrm, Insulin Pen Needle, and cyclobenzaprine.  Meds ordered this encounter  Medications  . cyclobenzaprine (FLEXERIL) 10 MG tablet    Sig: Take 1 tablet (10 mg total) by mouth 3 (three) times daily as needed for muscle spasms.    Dispense:  30 tablet    Refill:  2  . Liraglutide -Weight Management (SAXENDA) 18 MG/3ML SOPN    Sig: Inject 3 mg into the skin daily.    Dispense:  15 mL    Refill:  5    Patient has historically failed Belviq, Phentermine, Contrave    CMA served as scribe during this visit. History, Physical and Plan performed by medical provider. Documentation and orders reviewed and attested to.  Penni Homans, MD

## 2018-06-26 NOTE — Assessment & Plan Note (Signed)
hgba1c acceptable, minimize simple carbs. Increase exercise as tolerated. Continue current meds 

## 2018-06-28 DIAGNOSIS — G8929 Other chronic pain: Secondary | ICD-10-CM | POA: Insufficient documentation

## 2018-06-28 DIAGNOSIS — M545 Low back pain, unspecified: Secondary | ICD-10-CM | POA: Insufficient documentation

## 2018-06-28 NOTE — Assessment & Plan Note (Addendum)
Encouraged moist heat and gentle stretching as tolerated. May try NSAIDs and prescription meds as directed and report if symptoms worsen or seek immediate care. Given refill on cyclobenzaprine to use prn

## 2018-06-28 NOTE — Assessment & Plan Note (Signed)
[facility] and doing well on Ajovy and with Botox injections at times.

## 2018-06-28 NOTE — Assessment & Plan Note (Signed)
Encouraged heart healthy diet, increase exercise, avoid trans fats, consider a krill oil cap daily 

## 2018-06-28 NOTE — Assessment & Plan Note (Signed)
Patient encouraged to maintain heart healthy diet, regular exercise, adequate sleep. Consider daily probiotics. Take medications as prescribed. MGM ordered. Labs ordered and reviewed. Pap up to date.

## 2018-06-30 NOTE — Telephone Encounter (Signed)
PA approved.  The request has been approved. The authorization is effective for a maximum of 4 fills from 06/26/2018 to 10/26/2018, as long as the member is enrolled in their current health plan. The request was approved as submitted. The request was approved for 15mL (five pens) per 30 days. Renewal for Saxenda requires the patient has lost at least 4% of baseline body weight after 4 months of treatment. A written notification letter will follow with additional details.

## 2018-07-02 MED FILL — SHIPPING COST: 1 days supply | Qty: 1 | Fill #0

## 2018-07-02 MED FILL — SAXENDA 18 MG/3 ML PEN: 18 | 30 days supply | Qty: 15 | Fill #0

## 2018-07-03 ENCOUNTER — Encounter (HOSPITAL_BASED_OUTPATIENT_CLINIC_OR_DEPARTMENT_OTHER): Payer: Self-pay

## 2018-07-03 ENCOUNTER — Ambulatory Visit (HOSPITAL_BASED_OUTPATIENT_CLINIC_OR_DEPARTMENT_OTHER)
Admission: RE | Admit: 2018-07-03 | Discharge: 2018-07-03 | Disposition: A | Discharge status: Home / Self Care | Payer: 59 | Source: Ambulatory Visit | Attending: Family Medicine | Admitting: Family Medicine

## 2018-07-03 DIAGNOSIS — Z1231 Encounter for screening mammogram for malignant neoplasm of breast: Secondary | ICD-10-CM | POA: Insufficient documentation

## 2018-07-03 DIAGNOSIS — Z1239 Encounter for other screening for malignant neoplasm of breast: Secondary | ICD-10-CM

## 2018-07-07 ENCOUNTER — Encounter: Payer: Self-pay | Admitting: Family Medicine

## 2018-07-07 ENCOUNTER — Other Ambulatory Visit: Payer: Self-pay | Admitting: Family Medicine

## 2018-07-07 DIAGNOSIS — R928 Other abnormal and inconclusive findings on diagnostic imaging of breast: Secondary | ICD-10-CM

## 2018-07-08 ENCOUNTER — Telehealth: Payer: Self-pay | Admitting: *Deleted

## 2018-07-08 NOTE — Telephone Encounter (Signed)
[facility] to provider/[facility]S 07/31

## 2018-07-09 NOTE — Telephone Encounter (Signed)
Orders signed by PCP and faxed

## 2018-07-17 ENCOUNTER — Ambulatory Visit
Admission: RE | Admit: 2018-07-17 | Discharge: 2018-07-17 | Disposition: A | Discharge status: Home / Self Care | Payer: 59 | Source: Ambulatory Visit | Attending: Family Medicine | Admitting: Family Medicine

## 2018-07-17 ENCOUNTER — Other Ambulatory Visit: Payer: Self-pay | Admitting: Family Medicine

## 2018-07-17 DIAGNOSIS — R928 Other abnormal and inconclusive findings on diagnostic imaging of breast: Secondary | ICD-10-CM

## 2018-07-17 DIAGNOSIS — N6001 Solitary cyst of right breast: Secondary | ICD-10-CM | POA: Diagnosis not present

## 2018-07-24 ENCOUNTER — Telehealth: Payer: 59 | Admitting: Nurse Practitioner

## 2018-07-24 DIAGNOSIS — N3000 Acute cystitis without hematuria: Secondary | ICD-10-CM

## 2018-07-24 MED ORDER — CIPROFLOXACIN HCL 500 MG PO TABS: 500.0000 mg | ORAL_TABLET | Freq: Two times a day (BID) | ORAL | 0 refills | Status: DC

## 2018-07-24 NOTE — Progress Notes (Signed)

## 2018-07-27 ENCOUNTER — Telehealth: Payer: 59 | Admitting: Family

## 2018-07-27 DIAGNOSIS — B373 Candidiasis of vulva and vagina: Secondary | ICD-10-CM | POA: Diagnosis not present

## 2018-07-27 DIAGNOSIS — B3731 Acute candidiasis of vulva and vagina: Secondary | ICD-10-CM

## 2018-07-27 MED ORDER — FLUCONAZOLE 150 MG PO TABS
150.0000 mg | ORAL_TABLET | Freq: Every day | ORAL | 0 refills | Status: DC
Start: 2018-07-27 — End: 2019-02-05

## 2018-07-27 NOTE — Progress Notes (Signed)

## 2018-08-04 ENCOUNTER — Other Ambulatory Visit: Payer: Self-pay | Admitting: Family Medicine

## 2018-08-04 MED FILL — SHIPPING COST: 1 days supply | Qty: 1 | Fill #1

## 2018-08-04 MED FILL — SAXENDA 18 MG/3 ML PEN: 18 | 30 days supply | Qty: 15 | Fill #1

## 2018-08-04 MED FILL — AJOVY 225 MG/1.5ML SOSY: 225 | 90 days supply | Qty: 5 | Fill #1

## 2018-08-04 MED FILL — ESCITALOPRAM 20 MG TABLET: 20 | 90 days supply | Qty: 90 | Fill #0

## 2018-08-04 MED FILL — CYCLOBENZAPRINE HCL 10 MG T: 10 | 10 days supply | Qty: 30 | Fill #0

## 2018-08-04 MED FILL — ATORVASTATIN 10 MG TABLET: 10 | 90 days supply | Qty: 90 | Fill #0

## 2018-09-28 MED FILL — SAXENDA 18 MG/3 ML PEN: 18 | 30 days supply | Qty: 15 | Fill #2

## 2018-09-28 MED FILL — UNIFINE PENTIPS 32GX5/32": 32G X 4 MM | 90 days supply | Qty: 100 | Fill #1

## 2018-09-28 MED FILL — SHIPPING COST: 1 days supply | Qty: 1 | Fill #2

## 2018-09-28 MED FILL — UNIFINE PENTIPS 32GX5/32: 32G X 4 MM | 90 days supply | Qty: 100 | Fill #1

## 2018-10-09 DIAGNOSIS — H5213 Myopia, bilateral: Secondary | ICD-10-CM | POA: Diagnosis not present

## 2018-10-09 DIAGNOSIS — E119 Type 2 diabetes mellitus without complications: Secondary | ICD-10-CM | POA: Diagnosis not present

## 2018-10-09 LAB — HM DIABETES EYE EXAM

## 2018-10-11 DIAGNOSIS — Z23 Encounter for immunization: Secondary | ICD-10-CM | POA: Diagnosis not present

## 2018-10-12 ENCOUNTER — Telehealth: Payer: Self-pay | Admitting: *Deleted

## 2018-10-12 NOTE — Telephone Encounter (Signed)
Received Diabetic Eye Exam Report from [facility]; forwarded to provider/SLS 11/04

## 2018-10-15 ENCOUNTER — Telehealth: Payer: Self-pay

## 2018-10-15 NOTE — Telephone Encounter (Signed)
PA initiated via Covermymeds; KEY: [id number]. Awaiting determination.

## 2018-10-19 ENCOUNTER — Encounter: Payer: Self-pay | Admitting: Family Medicine

## 2018-10-19 NOTE — Telephone Encounter (Signed)
PA approved.  The request has been approved. The authorization is effective for a maximum of 12 fills from 10/19/2018 to 10/19/2019, as long as the member is enrolled in their current health plan. The request was reviewed and approved by a licensed clinical pharmacist. This request is approved for 15mL per 30 days. A written notification letter will follow with additional details

## 2018-10-26 MED FILL — AJOVY 225 MG/1.5ML SOSY: 225 | 90 days supply | Qty: 5 | Fill #2

## 2018-10-26 MED FILL — CYCLOBENZAPRINE HCL 10 MG T: 10 | 10 days supply | Qty: 30 | Fill #1

## 2018-10-26 MED FILL — SHIPPING COST: 1 days supply | Qty: 1 | Fill #3

## 2018-10-26 MED FILL — SAXENDA 18 MG/3 ML PEN: 18 | 30 days supply | Qty: 15 | Fill #3

## 2018-11-23 ENCOUNTER — Telehealth: Payer: 59 | Admitting: Family

## 2018-11-23 DIAGNOSIS — N390 Urinary tract infection, site not specified: Secondary | ICD-10-CM | POA: Diagnosis not present

## 2018-11-23 MED ORDER — CEPHALEXIN 500 MG PO CAPS: 500.0000 mg | ORAL_CAPSULE | Freq: Two times a day (BID) | ORAL | 0 refills | Status: DC

## 2018-11-23 MED FILL — ESCITALOPRAM 20 MG TABLET: 20 | 90 days supply | Qty: 90 | Fill #1

## 2018-11-23 MED FILL — SAXENDA 18 MG/3 ML PEN: 18 | 30 days supply | Qty: 15 | Fill #4

## 2018-11-23 MED FILL — CEPHALEXIN 500 MG CAPSULE: 500 | 7 days supply | Qty: 14 | Fill #0

## 2018-11-23 MED FILL — ATORVASTATIN 10 MG TABLET: 10 | 90 days supply | Qty: 90 | Fill #1

## 2018-11-23 MED FILL — CYCLOBENZAPRINE HCL 10 MG T: 10 | 10 days supply | Qty: 30 | Fill #2

## 2018-11-23 NOTE — Progress Notes (Signed)

## 2018-11-27 ENCOUNTER — Encounter: Payer: Self-pay | Admitting: Family Medicine

## 2018-11-29 ENCOUNTER — Other Ambulatory Visit: Payer: Self-pay | Admitting: Family Medicine

## 2019-01-01 ENCOUNTER — Ambulatory Visit: Payer: 59 | Admitting: Family Medicine

## 2019-01-11 ENCOUNTER — Telehealth: Payer: 59 | Admitting: Physician Assistant

## 2019-01-11 ENCOUNTER — Encounter: Payer: Self-pay | Admitting: Physician Assistant

## 2019-01-11 DIAGNOSIS — N390 Urinary tract infection, site not specified: Secondary | ICD-10-CM

## 2019-01-11 MED ORDER — NITROFURANTOIN MONOHYD MACRO 100 MG PO CAPS: 100.0000 mg | ORAL_CAPSULE | Freq: Two times a day (BID) | ORAL | 0 refills | Status: AC

## 2019-01-11 MED FILL — SHIPPING COST: 1 days supply | Qty: 1 | Fill #4

## 2019-01-11 MED FILL — SAXENDA 18 MG/3 ML PEN: 18 | 30 days supply | Qty: 15 | Fill #5

## 2019-01-11 MED FILL — NITROFURANTOIN MONO-MCR 100: 100 | 5 days supply | Qty: 10 | Fill #0

## 2019-01-11 NOTE — Progress Notes (Addendum)
We are sorry that you are not feeling well.  Here is how we plan to help!  Based on what you shared with me it looks like you most likely have a simple urinary tract infection. Dr. 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 As she stated, she has a scheduled appt with her PCP for further workup of her frequent UTIs.   Hi Dr. Dalbert Garnet! I am Evisit provide for today! Like you indicated, I think a urine culture to tailor your antibiotic would be helpful. Hope this helps meanwhile. I prescribed the Macrobid, to avoid Keflex again. Let me know if you need anything else from me.  A UTI (Urinary Tract Infection) is a bacterial infection of the bladder.  Most cases of urinary tract infections are simple to treat but a key part of your care is to encourage you to drink plenty of fluids and watch your symptoms carefully.  I have prescribed MacroBid 100 mg twice a day for 5 days.  Your symptoms should gradually improve. Call us if the burning in your urine worsens, you develop worsening fever, back pain or pelvic pain or if your symptoms do not resolve after completing the antibiotic.  Urinary tract infections can be prevented by drinking plenty of water to keep your body hydrated.  Also be sure when you wipe, wipe from front to back and don't hold it in!  If possible, empty your bladder every 4 hours.  Your e-visit answers were reviewed by a board certified advanced clinical practitioner to complete your personal care plan.  Depending on the condition, your plan could have included both over the counter or prescription medications.  If there is a problem please reply  once you have received a response from your provider.  Your safety is important to [place].  If you have drug allergies check your prescription carefully.    You can use MyChart to ask questions about today's visit, request a non-urgent call back, or ask for a work or school excuse for 24 hours related to this e-Visit. If  it has been greater than 24 hours you will need to follow up with your provider, or enter a new e-Visit to address those concerns.   You will get an e-mail in the next two days asking about your experience.  I hope that your e-visit has been valuable and will speed your recovery. Thank you for using e-visits.  I have spent 7 minutes in review of this chart- Illa Level [facility]

## 2019-02-05 ENCOUNTER — Ambulatory Visit (INDEPENDENT_AMBULATORY_CARE_PROVIDER_SITE_OTHER): Payer: 59 | Admitting: Family Medicine

## 2019-02-05 VITALS — BP 112/82 | HR 70 | Temp 99.2°F | Resp 18 | Ht 66.0 in | Wt 210.8 lb

## 2019-02-05 DIAGNOSIS — E559 Vitamin D deficiency, unspecified: Secondary | ICD-10-CM | POA: Diagnosis not present

## 2019-02-05 DIAGNOSIS — N390 Urinary tract infection, site not specified: Secondary | ICD-10-CM | POA: Diagnosis not present

## 2019-02-05 DIAGNOSIS — E782 Mixed hyperlipidemia: Secondary | ICD-10-CM | POA: Diagnosis not present

## 2019-02-05 DIAGNOSIS — E6609 Other obesity due to excess calories: Secondary | ICD-10-CM | POA: Diagnosis not present

## 2019-02-05 DIAGNOSIS — E669 Obesity, unspecified: Secondary | ICD-10-CM | POA: Diagnosis not present

## 2019-02-05 DIAGNOSIS — E1169 Type 2 diabetes mellitus with other specified complication: Secondary | ICD-10-CM | POA: Diagnosis not present

## 2019-02-05 LAB — COMPREHENSIVE METABOLIC PANEL
ALT: 15 U/L (ref 0–35)
AST: 16 U/L (ref 0–37)
Albumin: 4.3 g/dL (ref 3.5–5.2)
Alkaline Phosphatase: 71 U/L (ref 39–117)
BILIRUBIN TOTAL: 1.1 mg/dL (ref 0.2–1.2)
BUN: 14 mg/dL (ref 6–23)
CO2: 26 mEq/L (ref 19–32)
CREATININE: 0.81 mg/dL (ref 0.40–1.20)
Calcium: 9.8 mg/dL (ref 8.4–10.5)
Chloride: 104 mEq/L (ref 96–112)
GFR: 75.3 mL/min (ref >60.00)
GLUCOSE: 78 mg/dL (ref 70–99)
Potassium: 5.1 mEq/L (ref 3.5–5.1)
SODIUM: 138 meq/L (ref 135–145)
Total Protein: 7 g/dL (ref 6.0–8.3)

## 2019-02-05 LAB — TSH: TSH: 2.99 u[IU]/mL (ref 0.35–4.50)

## 2019-02-05 LAB — CBC
HCT: 40.2 % (ref 36.0–46.0)
Hemoglobin: 13.7 g/dL (ref 12.0–15.0)
MCHC: 34 g/dL (ref 30.0–36.0)
MCV: 96.2 fl (ref 78.0–100.0)
PLATELETS: 328 10*3/uL (ref 150.0–400.0)
RBC: 4.18 Mil/uL (ref 3.87–5.11)
RDW: 13.7 % (ref 11.5–15.5)
WBC: 9.5 10*3/uL (ref 4.0–10.5)

## 2019-02-05 LAB — LIPID PANEL
CHOL/HDL RATIO: 3
Cholesterol: 164 mg/dL (ref 0–200)
HDL: 50.1 mg/dL (ref >39.00)
LDL CALC: 93 mg/dL (ref 0–99)
NONHDL: 113.68
Triglycerides: 102 mg/dL (ref 0.0–149.0)
VLDL: 20.4 mg/dL (ref 0.0–40.0)

## 2019-02-05 LAB — HEMOGLOBIN A1C: HEMOGLOBIN A1C: 5.2 % (ref 4.6–6.5)

## 2019-02-05 LAB — VITAMIN D 25 HYDROXY (VIT D DEFICIENCY, FRACTURES): VITD: 54.52 ng/mL (ref 30.00–100.00)

## 2019-02-05 MED ORDER — NITROFURANTOIN MONOHYD MACRO 100 MG PO CAPS: 100.0000 mg | ORAL_CAPSULE | Freq: Two times a day (BID) | ORAL | 1 refills | Status: DC | PRN

## 2019-02-05 MED FILL — SHIPPING COST: 1 days supply | Qty: 1 | Fill #5

## 2019-02-05 MED FILL — NITROFURANTOIN MONO-MCR 100: 100 | 10 days supply | Qty: 20 | Fill #0

## 2019-02-05 NOTE — Assessment & Plan Note (Signed)
Supplement and monitor 

## 2019-02-05 NOTE — Patient Instructions (Signed)
Urinary Tract Infection, Adult A urinary tract infection (UTI) is an infection of any part of the urinary tract. The urinary tract includes:  The kidneys.  The ureters.  The bladder.  The urethra. These organs make, store, and get rid of pee (urine) in the body. What are the causes? This is caused by germs (bacteria) in your genital area. These germs grow and cause swelling (inflammation) of your urinary tract. What increases the risk? You are more likely to develop this condition if:  You have a small, thin tube (catheter) to drain pee.  You cannot control when you pee or poop (incontinence).  You are female, and: ? You use these methods to prevent pregnancy: ? A medicine that kills sperm (spermicide). ? A device that blocks sperm (diaphragm). ? You have low levels of a female hormone (estrogen). ? You are pregnant.  You have genes that add to your risk.  You are sexually active.  You take antibiotic medicines.  You have trouble peeing because of: ? A prostate that is bigger than normal, if you are female. ? A blockage in the part of your body that drains pee from the bladder (urethra). ? A kidney stone. ? A nerve condition that affects your bladder (neurogenic bladder). ? Not getting enough to drink. ? Not peeing often enough.  You have other conditions, such as: ? Diabetes. ? A weak disease-fighting system (immune system). ? Sickle cell disease. ? Gout. ? Injury of the spine. What are the signs or symptoms? Symptoms of this condition include:  Needing to pee right away (urgently).  Peeing often.  Peeing small amounts often.  Pain or burning when peeing.  Blood in the pee.  Pee that smells bad or not like normal.  Trouble peeing.  Pee that is cloudy.  Fluid coming from the vagina, if you are female.  Pain in the belly or lower back. Other symptoms include:  Throwing up (vomiting).  No urge to eat.  Feeling mixed up (confused).  Being tired  and grouchy (irritable).  A fever.  Watery poop (diarrhea). How is this treated? This condition may be treated with:  Antibiotic medicine.  Other medicines.  Drinking enough water. Follow these instructions at home:  Medicines  Take over-the-counter and prescription medicines only as told by your doctor.  If you were prescribed an antibiotic medicine, take it as told by your doctor. Do not stop taking it even if you start to feel better. General instructions  Make sure you: ? Pee until your bladder is empty. ? Do not hold pee for a long time. ? Empty your bladder after sex. ? Wipe from front to back after pooping if you are a female. Use each tissue one time when you wipe.  Drink enough fluid to keep your pee pale yellow.  Keep all follow-up visits as told by your doctor. This is important. Contact a doctor if:  You do not get better after 1-2 days.  Your symptoms go away and then come back. Get help right away if:  You have very bad back pain.  You have very bad pain in your lower belly.  You have a fever.  You are sick to your stomach (nauseous).  You are throwing up. Summary  A urinary tract infection (UTI) is an infection of any part of the urinary tract.  This condition is caused by germs in your genital area.  There are many risk factors for a UTI. These include having a small, thin   tube to drain pee and not being able to control when you pee or poop.  Treatment includes antibiotic medicines for germs.  Drink enough fluid to keep your pee pale yellow. This information is not intended to replace advice given to you by your health care provider. Make sure you discuss any questions you have with your health care provider. Document Released: 05/13/2008 Document Revised: 06/04/2018 Document Reviewed: 06/04/2018 Elsevier Interactive Patient Education  2019 [employer]  

## 2019-02-05 NOTE — Assessment & Plan Note (Signed)
Encouraged heart healthy diet, increase exercise, avoid trans fats, consider a krill oil cap daily 

## 2019-02-05 NOTE — Assessment & Plan Note (Signed)
hgba1c acceptable, minimize simple carbs. Increase exercise as tolerated.  

## 2019-02-06 NOTE — Assessment & Plan Note (Addendum)
Manages well and stays active will check labs today

## 2019-02-06 NOTE — Progress Notes (Signed)
Subjective:    Patient ID: Amanda James, female    DOB: [birth year], 48 y.o.   MRN: [id number]  No chief complaint on file.   HPI  Patient is in today for routine follow up. Overall she feels well just is not impressed with the whole aging process. Had a gastroenteritis illness last week but feels fully recovered at this time. No recent hospitalization or other acute concerns. Denies CP/palp/SOB/HA/congestion/fevers or GU c/o. Taking meds as prescribed  Past Medical History:  Diagnosis Date  . Allergy   . Cervical cancer screening 05/02/2017  . Depression   . Depression with anxiety   . Diabetes mellitus type 2 in obese ([place])   . Diabetes mellitus without complication ([place])   . History of chicken pox   . Hyperlipidemia   . Low back pain   . Migraines   . Obesity 11/10/2016    Past Surgical History:  Procedure Laterality Date  . ABDOMINOPLASTY  2012  . AUGMENTATION MAMMAPLASTY     maxilift with implants   . BLADDER SUSPENSION  2016  . LAPAROSCOPIC GASTRIC BANDING  2005   esophageal dilation. band still present but released  . MASTOPEXY  2012    Family History  Problem Relation Age of Onset  . Diabetes Mother   . Hypertension Mother   . Hyperlipidemia Mother   . Obesity Mother   . Heart disease Mother        pacer  . Diabetes Father   . Heart disease Father        MI  . Hyperlipidemia Father   . Hypertension Father   . Obesity Father   . Diabetes Sister   . Hyperlipidemia Sister   . Hypertension Sister   . Obesity Sister   . Obesity Maternal Grandmother   . Hypertension Maternal Grandmother   . Hyperlipidemia Maternal Grandmother   . Diabetes Maternal Grandmother   . Rosacea Maternal Grandmother   . Non-Hodgkin's lymphoma Maternal Grandmother   . Arthritis Maternal Grandmother        spinal stenosis  . Multiple myeloma Maternal Grandfather   . Hyperlipidemia Paternal Grandmother        rhabdo from statins  . Kidney disease Paternal Grandmother   .  Migraines Neg Hx     Social History   Socioeconomic History  . Marital status: Married    Spouse name: Not on file  . Number of children: 1  . Years of education: MD  . Highest education level: Not on file  Occupational History  . Occupation: [employer]  . Financial resource strain: Not on file  . Food insecurity:    Worry: Not on file    Inability: Not on file  . Transportation needs:    Medical: Not on file    Non-medical: Not on file  Tobacco Use  . Smoking status: Never Smoker  . Smokeless tobacco: Never Used  Substance and Sexual Activity  . Alcohol use: Yes    Alcohol/week: 5.0 standard drinks    Types: 5 Glasses of wine per week    Comment: 3-5 glasses of wine per week  . Drug use: No  . Sexual activity: Not on file  Lifestyle  . Physical activity:    Days per week: Not on file    Minutes per session: Not on file  . Stress: Not on file  Relationships  . Social connections:    Talks on phone: Not on file    Gets  together: Not on file    Attends religious service: Not on file    Active member of club or organization: Not on file    Attends meetings of clubs or organizations: Not on file    Relationship status: Not on file  . Intimate partner violence:    Fear of current or ex partner: Not on file    Emotionally abused: Not on file    Physically abused: Not on file    Forced sexual activity: Not on file  Other Topics Concern  . Not on file  Social History Narrative   Works With [employer], lives with husband, 3 cats   No major dietary restrictions. Exercise 30 minutes 3 x a week   Seat belts routinely   Right-handed   Caffeine: 1-2 cups of coffee per day    Outpatient Medications Prior to Visit  Medication Sig Dispense Refill  . atorvastatin (LIPITOR) 10 MG tablet TAKE 1 TABLET BY MOUTH ONCE DAILY 90 tablet 1  . Cetirizine HCl (ZYRTEC ALLERGY) 10 MG CAPS Take 1 capsule by mouth daily.    . cyclobenzaprine (FLEXERIL) 10 MG tablet Take 1  tablet (10 mg total) by mouth 3 (three) times daily as needed for muscle spasms. 30 tablet 2  . escitalopram (LEXAPRO) 20 MG tablet TAKE 1 TABLET BY MOUTH ONCE DAILY 90 tablet 1  . fluticasone (FLONASE) 50 MCG/ACT nasal spray Place 1 spray into both nostrils 2 (two) times daily.    . Fremanezumab-vfrm (AJOVY) 225 MG/1.5ML SOSY Inject 675 mg into the skin every 3 (three) months. 3 Syringe 4  . Insulin Pen Needle (BD PEN NEEDLE NANO U/F) 32G X 4 MM MISC To use w/ Victoza 100 each 1  . Liraglutide -Weight Management (SAXENDA) 18 MG/3ML SOPN Inject 3 mg into the skin daily. 15 mL 5  . tretinoin (RETIN-A) 0.025 % cream Apply topically at bedtime. To affected areas 20 g 0  . cephALEXin (KEFLEX) 500 MG capsule Take 1 capsule (500 mg total) by mouth 2 (two) times daily. 14 capsule 0  . ciprofloxacin (CIPRO) 500 MG tablet Take 1 tablet (500 mg total) by mouth 2 (two) times daily. 10 tablet 0  . fluconazole (DIFLUCAN) 150 MG tablet Take 1 tablet (150 mg total) by mouth daily. 1 tablet 0   No facility-administered medications prior to visit.     Allergies  Allergen Reactions  . Erythromycin Diarrhea    Review of Systems  Constitutional: Negative for fever and malaise/fatigue.  HENT: Negative for congestion.   Eyes: Negative for blurred vision.  Respiratory: Negative for cough and shortness of breath.   Cardiovascular: Negative for chest pain, palpitations and leg swelling.  Gastrointestinal: Negative for vomiting.  Musculoskeletal: Negative for back pain.  Skin: Negative for rash.  Neurological: Negative for loss of consciousness and headaches.       Objective:    Physical Exam  BP 112/82 (BP Location: Left Arm, Patient Position: Sitting, Cuff Size: Large)   Pulse 70   Temp 99.2 F (37.3 C) (Oral)   Resp 18   Ht '5\' 6"'$  (1.676 m)   Wt 210 lb 12.8 oz (95.6 kg)   SpO2 98%   BMI 34.02 kg/m  Wt Readings from Last 3 Encounters:  02/05/19 210 lb 12.8 oz (95.6 kg)  06/26/18 216 lb 9.6 oz  (98.2 kg)  12/12/17 214 lb 12.8 oz (97.4 kg)     Lab Results  Component Value Date   WBC 9.5 02/05/2019   HGB 13.7  02/05/2019   HCT 40.2 02/05/2019   PLT 328.0 02/05/2019   GLUCOSE 78 02/05/2019   CHOL 164 02/05/2019   TRIG 102.0 02/05/2019   HDL 50.10 02/05/2019   LDLCALC 93 02/05/2019   ALT 15 02/05/2019   AST 16 02/05/2019   NA 138 02/05/2019   K 5.1 02/05/2019   CL 104 02/05/2019   CREATININE 0.81 02/05/2019   BUN 14 02/05/2019   CO2 26 02/05/2019   TSH 2.99 02/05/2019   INR 1.04 09/17/2016   HGBA1C 5.2 02/05/2019   MICROALBUR <0.7 12/12/2017    Lab Results  Component Value Date   TSH 2.99 02/05/2019   Lab Results  Component Value Date   WBC 9.5 02/05/2019   HGB 13.7 02/05/2019   HCT 40.2 02/05/2019   MCV 96.2 02/05/2019   PLT 328.0 02/05/2019   Lab Results  Component Value Date   NA 138 02/05/2019   K 5.1 02/05/2019   CO2 26 02/05/2019   GLUCOSE 78 02/05/2019   BUN 14 02/05/2019   CREATININE 0.81 02/05/2019   BILITOT 1.1 02/05/2019   ALKPHOS 71 02/05/2019   AST 16 02/05/2019   ALT 15 02/05/2019   PROT 7.0 02/05/2019   ALBUMIN 4.3 02/05/2019   CALCIUM 9.8 02/05/2019   ANIONGAP 5 09/17/2016   GFR 75.30 02/05/2019   Lab Results  Component Value Date   CHOL 164 02/05/2019   Lab Results  Component Value Date   HDL 50.10 02/05/2019   Lab Results  Component Value Date   LDLCALC 93 02/05/2019   Lab Results  Component Value Date   TRIG 102.0 02/05/2019   Lab Results  Component Value Date   CHOLHDL 3 02/05/2019   Lab Results  Component Value Date   HGBA1C 5.2 02/05/2019       Assessment & Plan:   Problem List Items Addressed This Visit    Diabetes mellitus type 2 in obese ([place])    hgba1c acceptable, minimize simple carbs. Increase exercise as tolerated.       Relevant Orders   Hemoglobin A1c (Completed)   Comprehensive metabolic panel (Completed)   Lipid panel (Completed)   Insulin, random   Hyperlipidemia    Encouraged  heart healthy diet, increase exercise, avoid trans fats, consider a krill oil cap daily      Relevant Orders   Lipid panel (Completed)   TSH (Completed)   Obesity    Manages well and stays active will check labs today      Vitamin D deficiency    Supplement and monitor      Relevant Orders   VITAMIN D 25 Hydroxy (Vit-D Deficiency, Fractures) (Completed)    Other Visit Diagnoses    Urinary tract infection without hematuria, site unspecified    -  Primary   Relevant Medications   nitrofurantoin, macrocrystal-monohydrate, (MACROBID) 100 MG capsule   Other Relevant Orders   Urinalysis   Urine Culture   CBC (Completed)      I have discontinued Dr. [employer]'s ciprofloxacin, fluconazole, and cephALEXin. I am also having her start on nitrofurantoin (macrocrystal-monohydrate). Additionally, I am having her maintain her fluticasone, Cetirizine HCl, tretinoin, Fremanezumab-vfrm, Insulin Pen Needle, cyclobenzaprine, Liraglutide -Weight Management, atorvastatin, and escitalopram.  Meds ordered this encounter  Medications  . nitrofurantoin, macrocrystal-monohydrate, (MACROBID) 100 MG capsule    Sig: Take 1 capsule (100 mg total) by mouth 2 (two) times daily as needed.    Dispense:  20 capsule    Refill:  1  Penni Homans, MD

## 2019-02-08 LAB — INSULIN, RANDOM: Insulin: 7 u[IU]/mL

## 2019-02-16 ENCOUNTER — Other Ambulatory Visit: Payer: Self-pay | Admitting: Family Medicine

## 2019-02-18 MED FILL — UNIFINE PENTIPS 32GX5/32: 32G X 4 MM | 90 days supply | Qty: 100 | Fill #0

## 2019-02-18 MED FILL — SAXENDA 18 MG/3 ML PEN: 18 | 30 days supply | Qty: 15 | Fill #0

## 2019-02-18 MED FILL — UNIFINE PENTIPS 32GX5/32": 32G X 4 MM | 90 days supply | Qty: 100 | Fill #0

## 2019-02-18 MED FILL — SHIPPING COST: 1 days supply | Qty: 1 | Fill #6

## 2019-03-08 ENCOUNTER — Other Ambulatory Visit: Payer: Self-pay | Admitting: Neurology

## 2019-03-08 ENCOUNTER — Other Ambulatory Visit: Payer: Self-pay | Admitting: Family Medicine

## 2019-03-09 MED ORDER — ERENUMAB-AOOE 140 MG/ML ~~LOC~~ SOAJ: 140.0000 mg | SUBCUTANEOUS | 5 refills | Status: DC

## 2019-03-09 MED FILL — AJOVY 225 MG/1.5ML SOSY: 225 | 90 days supply | Qty: 5 | Fill #0

## 2019-03-09 MED FILL — ATORVASTATIN 10 MG TABLET: 10 | 90 days supply | Qty: 90 | Fill #0

## 2019-03-09 MED FILL — ESCITALOPRAM 20 MG TABLET: 20 | 90 days supply | Qty: 90 | Fill #0

## 2019-03-09 NOTE — Telephone Encounter (Signed)
This refill request I think is for aimovig, see note on refill request.

## 2019-03-09 NOTE — Telephone Encounter (Signed)
Spoke to pt and relayed that both ajovy and aimovig prescriptions were escribed to [facility].  From previous notes ajovy not approved, stating aimovig or emgality preferred.  She will go with what insurance will pay for.  If PA needed will do. She verbalized udnerstanding.

## 2019-03-10 ENCOUNTER — Telehealth: Payer: Self-pay | Admitting: *Deleted

## 2019-03-10 NOTE — Telephone Encounter (Signed)
Received fax for PA on aimovig 140mg /ml. Initiated on [facility] #[id number].  Dx G43.709.  15 headaches with 11-12 migraine. Lexapro, topiramate, nsaids, flexeril, tylenol. (has been on ajovy samples).

## 2019-03-10 NOTE — Telephone Encounter (Addendum)
Received appreoval for aimovig AIJ 140mg  /ml.  PA REF # [id number].  Good thru 03-09-19 thru 09-07-19.  Fax confirmaton to [employer]. [phone].

## 2019-03-14 MED FILL — SAXENDA 18 MG/3 ML PEN: 18 | 30 days supply | Qty: 15 | Fill #1

## 2019-03-18 MED FILL — AIMOVIG 140 MG/ML SOAJ: 140 | 30 days supply | Qty: 1 | Fill #0

## 2019-04-13 MED FILL — SAXENDA 18 MG/3 ML PEN: 18 | 30 days supply | Qty: 15 | Fill #2

## 2019-05-14 MED FILL — SAXENDA 18 MG/3 ML PEN: 18 | 30 days supply | Qty: 15 | Fill #3

## 2019-06-01 MED FILL — ATORVASTATIN 10 MG TABLET: 10 | 90 days supply | Qty: 90 | Fill #1

## 2019-06-01 MED FILL — ESCITALOPRAM 20 MG TABLET: 20 | 90 days supply | Qty: 90 | Fill #1

## 2019-06-14 MED FILL — SAXENDA 18 MG/3 ML PEN: 18 | 30 days supply | Qty: 15 | Fill #4

## 2019-06-28 ENCOUNTER — Encounter: Payer: Self-pay | Admitting: Family Medicine

## 2019-07-01 NOTE — Telephone Encounter (Signed)
Princess -- I have changed pt's physical to a virtual follow up for the same date / time of tomorrow at 8am but PCP's schedule says she has a meeting at that time. Is it ok to leave pt in this time slot? I have not notified pt yet.

## 2019-07-02 ENCOUNTER — Ambulatory Visit (INDEPENDENT_AMBULATORY_CARE_PROVIDER_SITE_OTHER): Payer: 59 | Admitting: Family Medicine

## 2019-07-02 ENCOUNTER — Other Ambulatory Visit: Payer: Self-pay

## 2019-07-02 DIAGNOSIS — G43709 Chronic migraine without aura, not intractable, without status migrainosus: Secondary | ICD-10-CM | POA: Diagnosis not present

## 2019-07-02 DIAGNOSIS — G8929 Other chronic pain: Secondary | ICD-10-CM | POA: Diagnosis not present

## 2019-07-02 DIAGNOSIS — E782 Mixed hyperlipidemia: Secondary | ICD-10-CM

## 2019-07-02 DIAGNOSIS — E669 Obesity, unspecified: Secondary | ICD-10-CM | POA: Diagnosis not present

## 2019-07-02 DIAGNOSIS — L700 Acne vulgaris: Secondary | ICD-10-CM

## 2019-07-02 DIAGNOSIS — M545 Low back pain: Secondary | ICD-10-CM | POA: Diagnosis not present

## 2019-07-02 DIAGNOSIS — E1169 Type 2 diabetes mellitus with other specified complication: Secondary | ICD-10-CM | POA: Diagnosis not present

## 2019-07-02 DIAGNOSIS — E559 Vitamin D deficiency, unspecified: Secondary | ICD-10-CM

## 2019-07-02 MED ORDER — TRETINOIN 0.025 % EX CREA: TOPICAL_CREAM | Freq: Every day | CUTANEOUS | 5 refills | Status: DC

## 2019-07-02 MED ORDER — CYCLOBENZAPRINE HCL 10 MG PO TABS: 10.0000 mg | ORAL_TABLET | Freq: Three times a day (TID) | ORAL | 2 refills | Status: DC | PRN

## 2019-07-02 MED FILL — CYCLOBENZAPRINE HCL 10 MG T: 10 | 10 days supply | Qty: 30 | Fill #0

## 2019-07-02 NOTE — Assessment & Plan Note (Signed)
hgba1c acceptable, minimize simple carbs. Increase exercise as tolerated. Continue current meds. Check insulin and hgba1c levels

## 2019-07-02 NOTE — Assessment & Plan Note (Signed)
Tolerating statin, encouraged heart healthy diet, avoid trans fats, minimize simple carbs and saturated fats. Increase exercise as tolerated 

## 2019-07-02 NOTE — Assessment & Plan Note (Signed)
Supplement and monitor 

## 2019-07-02 NOTE — Assessment & Plan Note (Signed)
No recent exacerbations noted.  

## 2019-07-02 NOTE — Progress Notes (Signed)
Virtual Visit via Video Note  I connected with Amanda James on 07/02/19 at  8:00 AM EDT by a video enabled telemedicine application and verified that I am speaking with the correct person using two identifiers.  Location: Patient: home Provider: office   I discussed the limitations of evaluation and management by telemedicine and the availability of in person appointments. The patient expressed understanding and agreed to proceed. Amanda James, CMA was able to get patient set up on video visit.     Subjective:    Patient ID: Amanda James, female    DOB: 02/18/1971, 48 y.o.   MRN: [id number]  Chief Complaint  Patient presents with  . Hyperlipidemia    follow up  . Diabetes    HPI Patient is in today for follow up on chronic medical concerns including diabetes, hyperlipidemia, back pain and more. She feels well today. No recent febrile illness or hospitalizations. She is not noting back pain today but is requesting a refill on Cyclobenzaprine to use prn. No c/o polyuria or polydipsia. Denies CP/palp/SOB/HA/congestion/fevers/GI or GU c/o. Taking meds as prescribed  Past Medical History:  Diagnosis Date  . Allergy   . Cervical cancer screening 05/02/2017  . Depression   . Depression with anxiety   . Diabetes mellitus type 2 in obese ([place])   . Diabetes mellitus without complication ([place])   . History of chicken pox   . Hyperlipidemia   . Low back pain   . Migraines   . Obesity 11/10/2016    Past Surgical History:  Procedure Laterality Date  . ABDOMINOPLASTY  2012  . AUGMENTATION MAMMAPLASTY     maxilift with implants   . BLADDER SUSPENSION  2016  . LAPAROSCOPIC GASTRIC BANDING  2005   esophageal dilation. band still present but released  . MASTOPEXY  2012    Family History  Problem Relation Age of Onset  . Diabetes Mother   . Hypertension Mother   . Hyperlipidemia Mother   . Obesity Mother   . Heart disease Mother        pacer  . Diabetes Father   . Heart  disease Father        MI  . Hyperlipidemia Father   . Hypertension Father   . Obesity Father   . Diabetes Sister   . Hyperlipidemia Sister   . Hypertension Sister   . Obesity Sister   . Obesity Maternal Grandmother   . Hypertension Maternal Grandmother   . Hyperlipidemia Maternal Grandmother   . Diabetes Maternal Grandmother   . Rosacea Maternal Grandmother   . Non-Hodgkin's lymphoma Maternal Grandmother   . Arthritis Maternal Grandmother        spinal stenosis  . Multiple myeloma Maternal Grandfather   . Hyperlipidemia Paternal Grandmother        rhabdo from statins  . Kidney disease Paternal Grandmother   . Migraines Neg Hx     Social History   Socioeconomic History  . Marital status: Married    Spouse name: Not on file  . Number of children: 1  . Years of education: MD  . Highest education level: Not on file  Occupational History  . Occupation: [employer]  . Financial resource strain: Not on file  . Food insecurity    Worry: Not on file    Inability: Not on file  . Transportation needs    Medical: Not on file    Non-medical: Not on file  Tobacco Use  .  Smoking status: Never Smoker  . Smokeless tobacco: Never Used  Substance and Sexual Activity  . Alcohol use: Yes    Alcohol/week: 5.0 standard drinks    Types: 5 Glasses of wine per week    Comment: 3-5 glasses of wine per week  . Drug use: No  . Sexual activity: Not on file  Lifestyle  . Physical activity    Days per week: Not on file    Minutes per session: Not on file  . Stress: Not on file  Relationships  . Social Herbalist on phone: Not on file    Gets together: Not on file    Attends religious service: Not on file    Active member of club or organization: Not on file    Attends meetings of clubs or organizations: Not on file    Relationship status: Not on file  . Intimate partner violence    Fear of current or ex partner: Not on file    Emotionally abused: Not on file     Physically abused: Not on file    Forced sexual activity: Not on file  Other Topics Concern  . Not on file  Social History Narrative   Works With [employer], lives with husband, 3 cats   No major dietary restrictions. Exercise 30 minutes 3 x a week   Seat belts routinely   Right-handed   Caffeine: 1-2 cups of coffee per day    Outpatient Medications Prior to Visit  Medication Sig Dispense Refill  . AJOVY 225 MG/1.5ML SOSY INJECT 675 MG INTO THE SKIN EVERY 3 MONTHS 4.5 mL 4  . atorvastatin (LIPITOR) 10 MG tablet TAKE 1 TABLET BY MOUTH ONCE DAILY 90 tablet 1  . Cetirizine HCl (ZYRTEC ALLERGY) 10 MG CAPS Take 1 capsule by mouth daily.    Eduard Roux (AIMOVIG) 140 MG/ML SOAJ Inject 140 mg into the skin every 30 (thirty) days. 1 pen 5  . escitalopram (LEXAPRO) 20 MG tablet TAKE 1 TABLET BY MOUTH ONCE DAILY 90 tablet 1  . fluticasone (FLONASE) 50 MCG/ACT nasal spray Place 1 spray into both nostrils 2 (two) times daily.    . nitrofurantoin, macrocrystal-monohydrate, (MACROBID) 100 MG capsule Take 1 capsule (100 mg total) by mouth 2 (two) times daily as needed. 20 capsule 1  . SAXENDA 18 MG/3ML SOPN INJECT 3 MG INTO THE SKIN DAILY. 15 mL 5  . UNIFINE PENTIPS 32G X 4 MM MISC USE WITH VICTOZA 100 each 1  . cyclobenzaprine (FLEXERIL) 10 MG tablet Take 1 tablet (10 mg total) by mouth 3 (three) times daily as needed for muscle spasms. 30 tablet 2  . tretinoin (RETIN-A) 0.025 % cream Apply topically at bedtime. To affected areas 20 g 0   No facility-administered medications prior to visit.     Allergies  Allergen Reactions  . Erythromycin Diarrhea   2 Review of Systems  Constitutional: Negative for fever and malaise/fatigue.  HENT: Negative for congestion.   Eyes: Negative for blurred vision.  Respiratory: Negative for shortness of breath.   Cardiovascular: Negative for chest pain, palpitations and leg swelling.  Gastrointestinal: Negative for abdominal pain, blood in stool and nausea.   Genitourinary: Negative for dysuria and frequency.  Musculoskeletal: Negative for falls.  Skin: Negative for rash.  Neurological: Negative for dizziness, loss of consciousness and headaches.  Endo/Heme/Allergies: Negative for environmental allergies.  Psychiatric/Behavioral: Negative for depression. The patient is not nervous/anxious.        Objective:  Physical Exam Vitals signs and nursing note reviewed.  Constitutional:      General: She is not in acute distress.    Appearance: Normal appearance. She is well-developed. She is not ill-appearing.  HENT:     Nose: Nose normal.  Eyes:     General:        Right eye: No discharge.        Left eye: No discharge.  Cardiovascular:     Heart sounds: No murmur.  Pulmonary:     Effort: Pulmonary effort is normal.  Skin:    General: Skin is warm and dry.  Neurological:     Mental Status: She is alert and oriented to person, place, and time.  Psychiatric:        Mood and Affect: Mood normal.        Behavior: Behavior normal.     There were no vitals taken for this visit. Wt Readings from Last 3 Encounters:  02/05/19 210 lb 12.8 oz (95.6 kg)  06/26/18 216 lb 9.6 oz (98.2 kg)  12/12/17 214 lb 12.8 oz (97.4 kg)    Diabetic Foot Exam - Simple   No data filed     Lab Results  Component Value Date   WBC 9.5 02/05/2019   HGB 13.7 02/05/2019   HCT 40.2 02/05/2019   PLT 328.0 02/05/2019   GLUCOSE 78 02/05/2019   CHOL 164 02/05/2019   TRIG 102.0 02/05/2019   HDL 50.10 02/05/2019   LDLCALC 93 02/05/2019   ALT 15 02/05/2019   AST 16 02/05/2019   NA 138 02/05/2019   K 5.1 02/05/2019   CL 104 02/05/2019   CREATININE 0.81 02/05/2019   BUN 14 02/05/2019   CO2 26 02/05/2019   TSH 2.99 02/05/2019   INR 1.04 09/17/2016   HGBA1C 5.2 02/05/2019   MICROALBUR <0.7 12/12/2017    Lab Results  Component Value Date   TSH 2.99 02/05/2019   Lab Results  Component Value Date   WBC 9.5 02/05/2019   HGB 13.7 02/05/2019   HCT  40.2 02/05/2019   MCV 96.2 02/05/2019   PLT 328.0 02/05/2019   Lab Results  Component Value Date   NA 138 02/05/2019   K 5.1 02/05/2019   CO2 26 02/05/2019   GLUCOSE 78 02/05/2019   BUN 14 02/05/2019   CREATININE 0.81 02/05/2019   BILITOT 1.1 02/05/2019   ALKPHOS 71 02/05/2019   AST 16 02/05/2019   ALT 15 02/05/2019   PROT 7.0 02/05/2019   ALBUMIN 4.3 02/05/2019   CALCIUM 9.8 02/05/2019   ANIONGAP 5 09/17/2016   GFR 75.30 02/05/2019   Lab Results  Component Value Date   CHOL 164 02/05/2019   Lab Results  Component Value Date   HDL 50.10 02/05/2019   Lab Results  Component Value Date   LDLCALC 93 02/05/2019   Lab Results  Component Value Date   TRIG 102.0 02/05/2019   Lab Results  Component Value Date   CHOLHDL 3 02/05/2019   Lab Results  Component Value Date   HGBA1C 5.2 02/05/2019       Assessment & Plan:   Problem List Items Addressed This Visit    Diabetes mellitus type 2 in obese ([place])    hgba1c acceptable, minimize simple carbs. Increase exercise as tolerated. Continue current meds. Check insulin and hgba1c levels      Relevant Orders   Hemoglobin A1c   CBC   Comprehensive metabolic panel   TSH   Hyperlipidemia - Primary  Tolerating statin, encouraged heart healthy diet, avoid trans fats, minimize simple carbs and saturated fats. Increase exercise as tolerated      Relevant Orders   CBC   Comprehensive metabolic panel   Lipid panel   Vitamin D deficiency    Supplement and monitor      Relevant Orders   VITAMIN D 25 Hydroxy (Vit-D Deficiency, Fractures)   Chronic migraine w/o aura w/o status migrainosus, not intractable    No recent exacerbations noted.      Relevant Medications   cyclobenzaprine (FLEXERIL) 10 MG tablet   Chronic bilateral low back pain without sciatica   Relevant Medications   cyclobenzaprine (FLEXERIL) 10 MG tablet    Other Visit Diagnoses    Acne vulgaris       Relevant Medications   tretinoin (RETIN-A)  0.025 % cream      I am having Dr. Starlyn Skeans maintain her fluticasone, Cetirizine HCl, nitrofurantoin (macrocrystal-monohydrate), Saxenda, Unifine Pentips, atorvastatin, escitalopram, Ajovy, Erenumab-aooe, cyclobenzaprine, and tretinoin.  Meds ordered this encounter  Medications  . cyclobenzaprine (FLEXERIL) 10 MG tablet    Sig: Take 1 tablet (10 mg total) by mouth 3 (three) times daily as needed for muscle spasms.    Dispense:  30 tablet    Refill:  2  . tretinoin (RETIN-A) 0.025 % cream    Sig: Apply topically at bedtime. To affected areas    Dispense:  20 g    Refill:  5     Penni Homans, MD I discussed the assessment and treatment plan with the patient. The patient was provided an opportunity to ask questions and all were answered. The patient agreed with the plan and demonstrated an understanding of the instructions.   The patient was advised to call back or seek an in-person evaluation if the symptoms worsen or if the condition fails to improve as anticipated.  I provided 25 minutes of non-face-to-face time during this encounter.   Penni Homans, MD

## 2019-07-07 ENCOUNTER — Telehealth: Payer: Self-pay

## 2019-07-07 NOTE — Addendum Note (Signed)
Addended by: Magdalene Molly A on: 07/07/2019 01:37 PM   Modules accepted: Orders

## 2019-07-07 NOTE — Telephone Encounter (Signed)
Copied from [place] [id number]. Topic: General - Other >> Jul 05, 2019 11:11 AM Lennox Solders wrote: Reason for CRM:  April with dr Leafy Ro office is requesting the blood work dr blyth put in to be release to [place] or fax to [facility] [phone] >> Jul 06, 2019 12:51 PM Rayann Heman wrote: April calling to check status.      -I have re ordered the lab work hopefully with the correct way to order for them. I have reached out to [name] to let her know

## 2019-07-12 MED FILL — SAXENDA 18 MG/3 ML PEN: 18 | 30 days supply | Qty: 15 | Fill #5

## 2019-07-23 ENCOUNTER — Encounter: Payer: Self-pay | Admitting: Family Medicine

## 2019-07-23 DIAGNOSIS — L700 Acne vulgaris: Secondary | ICD-10-CM

## 2019-07-26 MED ORDER — TRETINOIN 0.025 % EX CREA: TOPICAL_CREAM | Freq: Every day | CUTANEOUS | 5 refills | Status: DC

## 2019-07-29 MED FILL — NITROFURANTOIN MONO-MCR 100: 100 | 10 days supply | Qty: 20 | Fill #1

## 2019-07-29 MED FILL — CYCLOBENZAPRINE HCL 10 MG T: 10 | 10 days supply | Qty: 30 | Fill #0

## 2019-07-29 MED FILL — AIMOVIG 140 MG/ML SOAJ: 140 | 30 days supply | Qty: 1 | Fill #1

## 2019-07-29 MED FILL — TRETINOIN 0.025% CREAM: 0.025 | 60 days supply | Qty: 45 | Fill #0

## 2019-08-06 MED FILL — CYCLOBENZAPRINE HCL 10 MG T: 10 | 10 days supply | Qty: 30 | Fill #1

## 2019-08-11 ENCOUNTER — Other Ambulatory Visit: Payer: Self-pay | Admitting: Family Medicine

## 2019-08-12 MED FILL — SAXENDA 18 MG/3 ML PEN: 18 | 30 days supply | Qty: 15 | Fill #0

## 2019-08-14 MED FILL — CYCLOBENZAPRINE HCL 10 MG T: 10 | 10 days supply | Qty: 30 | Fill #2

## 2019-08-17 ENCOUNTER — Encounter: Payer: Self-pay | Admitting: Family Medicine

## 2019-08-20 ENCOUNTER — Encounter: Payer: 59 | Admitting: Family Medicine

## 2019-08-23 MED FILL — AIMOVIG 140 MG/ML SOAJ: 140 | 30 days supply | Qty: 1 | Fill #2

## 2019-08-24 ENCOUNTER — Other Ambulatory Visit: Payer: Self-pay | Admitting: Family Medicine

## 2019-08-24 DIAGNOSIS — M545 Low back pain, unspecified: Secondary | ICD-10-CM

## 2019-08-24 DIAGNOSIS — G8929 Other chronic pain: Secondary | ICD-10-CM

## 2019-08-24 MED FILL — CYCLOBENZAPRINE HCL 10 MG T: 10 | 10 days supply | Qty: 30 | Fill #0

## 2019-08-30 ENCOUNTER — Other Ambulatory Visit: Payer: Self-pay | Admitting: *Deleted

## 2019-08-30 ENCOUNTER — Other Ambulatory Visit: Payer: Self-pay | Admitting: Family Medicine

## 2019-08-30 MED ORDER — ATORVASTATIN CALCIUM 10 MG PO TABS: 10.0000 mg | ORAL_TABLET | Freq: Every day | ORAL | 1 refills | Status: DC

## 2019-08-30 MED FILL — ATORVASTATIN 10 MG TABLET: 10 | 90 days supply | Qty: 90 | Fill #0

## 2019-08-31 MED FILL — ESCITALOPRAM 20 MG TABLET: 20 | 90 days supply | Qty: 90 | Fill #0

## 2019-09-01 MED FILL — CYCLOBENZAPRINE HCL 10 MG T: 10 | 10 days supply | Qty: 30 | Fill #1

## 2019-09-02 ENCOUNTER — Telehealth: Payer: Self-pay

## 2019-09-02 NOTE — Telephone Encounter (Signed)
Pending renewal for Aimovig Key: [id number] G43.709   Once a decision is made I will make a update.

## 2019-09-07 NOTE — Telephone Encounter (Signed)
PA has been approved.  Your request has been approved The request has been approved. The authorization is effective for a maximum of 12 fills from 09/08/2019 to 09/06/2020, as long as the member is enrolled in their current health plan. The request was approved as submitted. This request has been approved for 108mL per 30 days. A written notification letter will follow with additional details.

## 2019-09-08 MED FILL — SAXENDA 18 MG/3 ML PEN: 18 | 30 days supply | Qty: 15 | Fill #1

## 2019-09-09 MED FILL — CYCLOBENZAPRINE HCL 10 MG T: 10 | 10 days supply | Qty: 30 | Fill #2

## 2019-09-10 ENCOUNTER — Ambulatory Visit (INDEPENDENT_AMBULATORY_CARE_PROVIDER_SITE_OTHER): Payer: 59 | Admitting: Family Medicine

## 2019-09-10 ENCOUNTER — Other Ambulatory Visit: Payer: Self-pay

## 2019-09-10 VITALS — BP 110/78 | HR 78 | Temp 97.1°F | Resp 18 | Wt 219.2 lb

## 2019-09-10 DIAGNOSIS — E1169 Type 2 diabetes mellitus with other specified complication: Secondary | ICD-10-CM | POA: Diagnosis not present

## 2019-09-10 DIAGNOSIS — E669 Obesity, unspecified: Secondary | ICD-10-CM | POA: Diagnosis not present

## 2019-09-10 DIAGNOSIS — G43709 Chronic migraine without aura, not intractable, without status migrainosus: Secondary | ICD-10-CM | POA: Diagnosis not present

## 2019-09-10 DIAGNOSIS — Z1239 Encounter for other screening for malignant neoplasm of breast: Secondary | ICD-10-CM

## 2019-09-10 DIAGNOSIS — Z23 Encounter for immunization: Secondary | ICD-10-CM | POA: Diagnosis not present

## 2019-09-10 DIAGNOSIS — Z0000 Encounter for general adult medical examination without abnormal findings: Secondary | ICD-10-CM | POA: Diagnosis not present

## 2019-09-10 DIAGNOSIS — E782 Mixed hyperlipidemia: Secondary | ICD-10-CM

## 2019-09-10 DIAGNOSIS — E559 Vitamin D deficiency, unspecified: Secondary | ICD-10-CM

## 2019-09-10 LAB — COMPREHENSIVE METABOLIC PANEL
ALT: 18 U/L (ref 0–35)
AST: 17 U/L (ref 0–37)
Albumin: 4.3 g/dL (ref 3.5–5.2)
Alkaline Phosphatase: 80 U/L (ref 39–117)
BUN: 14 mg/dL (ref 6–23)
CO2: 25 mEq/L (ref 19–32)
Calcium: 9.4 mg/dL (ref 8.4–10.5)
Chloride: 103 mEq/L (ref 96–112)
Creatinine, Ser: 0.82 mg/dL (ref 0.40–1.20)
GFR: 74.06 mL/min (ref >60.00)
Glucose, Bld: 89 mg/dL (ref 70–99)
Potassium: 4.8 mEq/L (ref 3.5–5.1)
Sodium: 137 mEq/L (ref 135–145)
Total Bilirubin: 0.8 mg/dL (ref 0.2–1.2)
Total Protein: 7.2 g/dL (ref 6.0–8.3)

## 2019-09-10 LAB — LIPID PANEL
Cholesterol: 166 mg/dL (ref 0–200)
HDL: 52.8 mg/dL (ref >39.00)
LDL Cholesterol: 95 mg/dL (ref 0–99)
NonHDL: 113.35
Total CHOL/HDL Ratio: 3
Triglycerides: 92 mg/dL (ref 0.0–149.0)
VLDL: 18.4 mg/dL (ref 0.0–40.0)

## 2019-09-10 LAB — CBC
HCT: 40.5 % (ref 36.0–46.0)
Hemoglobin: 13.4 g/dL (ref 12.0–15.0)
MCHC: 33.2 g/dL (ref 30.0–36.0)
MCV: 96.8 fl (ref 78.0–100.0)
Platelets: 294 10*3/uL (ref 150.0–400.0)
RBC: 4.18 Mil/uL (ref 3.87–5.11)
RDW: 13.7 % (ref 11.5–15.5)
WBC: 8.5 10*3/uL (ref 4.0–10.5)

## 2019-09-10 LAB — TSH: TSH: 2.73 u[IU]/mL (ref 0.35–4.50)

## 2019-09-10 LAB — MICROALBUMIN / CREATININE URINE RATIO
Creatinine,U: 38.1 mg/dL
Microalb Creat Ratio: 1.8 mg/g (ref 0.0–30.0)
Microalb, Ur: <0.7 mg/dL (ref 0.0–1.9)

## 2019-09-10 LAB — VITAMIN D 25 HYDROXY (VIT D DEFICIENCY, FRACTURES): VITD: 50.04 ng/mL (ref 30.00–100.00)

## 2019-09-10 LAB — HEMOGLOBIN A1C: Hgb A1c MFr Bld: 5.5 % (ref 4.6–6.5)

## 2019-09-10 NOTE — Patient Instructions (Signed)

## 2019-09-11 NOTE — Assessment & Plan Note (Signed)
hgba1c acceptable, minimize simple carbs. Increase exercise as tolerated. Continue current meds 

## 2019-09-11 NOTE — Assessment & Plan Note (Addendum)
Patient encouraged to maintain heart healthy diet, regular exercise, adequate sleep. Consider daily probiotics. Take medications as prescribed. Labs ordered and reviewed. PAP up to date. Mammogram ordered.

## 2019-09-11 NOTE — Progress Notes (Signed)
Subjective:    Patient ID: Amanda James, female    DOB: [birth year], 48 y.o.   MRN: [id number]  No chief complaint on file.   HPI Patient is in today for annual preventative exam. She feels well today. No recent febrile illness or hospitalizations. Is seeing her dentist later today. She is working full time as a Engineer, drilling but has had no concerning illness. She is not as active as she was before the pandemic. Frustrated with weight. Denies CP/palp/SOB/HA/congestion/fevers/GI or GU c/o. Taking meds as prescribed  Past Medical History:  Diagnosis Date  . Allergy   . Cervical cancer screening 05/02/2017  . Depression   . Depression with anxiety   . Diabetes mellitus type 2 in obese ([place])   . Diabetes mellitus without complication ([place])   . History of chicken pox   . Hyperlipidemia   . Low back pain   . Migraines   . Obesity 11/10/2016    Past Surgical History:  Procedure Laterality Date  . ABDOMINOPLASTY  2012  . AUGMENTATION MAMMAPLASTY     maxilift with implants   . BLADDER SUSPENSION  2016  . LAPAROSCOPIC GASTRIC BANDING  2005   esophageal dilation. band still present but released  . MASTOPEXY  2012    Family History  Problem Relation Age of Onset  . Diabetes Mother   . Hypertension Mother   . Hyperlipidemia Mother   . Obesity Mother   . Heart disease Mother        pacer  . Diabetes Father   . Heart disease Father        MI  . Hyperlipidemia Father   . Hypertension Father   . Obesity Father   . Diabetes Sister   . Hyperlipidemia Sister   . Hypertension Sister   . Obesity Sister   . Obesity Maternal Grandmother   . Hypertension Maternal Grandmother   . Hyperlipidemia Maternal Grandmother   . Diabetes Maternal Grandmother   . Rosacea Maternal Grandmother   . Non-Hodgkin's lymphoma Maternal Grandmother   . Arthritis Maternal Grandmother        spinal stenosis  . Multiple myeloma Maternal Grandfather   . Hyperlipidemia Paternal Grandmother        rhabdo  from statins  . Kidney disease Paternal Grandmother   . Migraines Neg Hx     Social History   Socioeconomic History  . Marital status: Married    Spouse name: Not on file  . Number of children: 1  . Years of education: MD  . Highest education level: Not on file  Occupational History  . Occupation: [employer]  . Financial resource strain: Not on file  . Food insecurity    Worry: Not on file    Inability: Not on file  . Transportation needs    Medical: Not on file    Non-medical: Not on file  Tobacco Use  . Smoking status: Never Smoker  . Smokeless tobacco: Never Used  Substance and Sexual Activity  . Alcohol use: Yes    Alcohol/week: 5.0 standard drinks    Types: 5 Glasses of wine per week    Comment: 3-5 glasses of wine per week  . Drug use: No  . Sexual activity: Not on file  Lifestyle  . Physical activity    Days per week: Not on file    Minutes per session: Not on file  . Stress: Not on file  Relationships  . Social connections  Talks on phone: Not on file    Gets together: Not on file    Attends religious service: Not on file    Active member of club or organization: Not on file    Attends meetings of clubs or organizations: Not on file    Relationship status: Not on file  . Intimate partner violence    Fear of current or ex partner: Not on file    Emotionally abused: Not on file    Physically abused: Not on file    Forced sexual activity: Not on file  Other Topics Concern  . Not on file  Social History Narrative   Works With [employer], lives with husband, 3 cats   No major dietary restrictions. Exercise 30 minutes 3 x a week   Seat belts routinely   Right-handed   Caffeine: 1-2 cups of coffee per day    Outpatient Medications Prior to Visit  Medication Sig Dispense Refill  . AJOVY 225 MG/1.5ML SOSY INJECT 675 MG INTO THE SKIN EVERY 3 MONTHS 4.5 mL 4  . atorvastatin (LIPITOR) 10 MG tablet Take 1 tablet (10 mg total) by mouth daily. 90  tablet 1  . Cetirizine HCl (ZYRTEC ALLERGY) 10 MG CAPS Take 1 capsule by mouth daily.    . cyclobenzaprine (FLEXERIL) 10 MG tablet TAKE 1 TABLET (10 MG TOTAL) BY MOUTH 3 (THREE) TIMES DAILY AS NEEDED FOR MUSCLE SPASMS. 30 tablet 2  . Erenumab-aooe (AIMOVIG) 140 MG/ML SOAJ Inject 140 mg into the skin every 30 (thirty) days. 1 pen 5  . escitalopram (LEXAPRO) 20 MG tablet TAKE 1 TABLET BY MOUTH ONCE DAILY 90 tablet 1  . fluticasone (FLONASE) 50 MCG/ACT nasal spray Place 1 spray into both nostrils 2 (two) times daily.    . nitrofurantoin, macrocrystal-monohydrate, (MACROBID) 100 MG capsule Take 1 capsule (100 mg total) by mouth 2 (two) times daily as needed. 20 capsule 1  . SAXENDA 18 MG/3ML SOPN INJECT 3 MG INTO THE SKIN DAILY. 15 mL 5  . tretinoin (RETIN-A) 0.025 % cream Apply topically at bedtime. To affected areas 45 g 5  . UNIFINE PENTIPS 32G X 4 MM MISC USE WITH VICTOZA 100 each 1   No facility-administered medications prior to visit.     Allergies  Allergen Reactions  . Erythromycin Diarrhea    Review of Systems  Constitutional: Negative for chills, fever and malaise/fatigue.  HENT: Negative for congestion and hearing loss.   Eyes: Negative for discharge.  Respiratory: Negative for cough, sputum production and shortness of breath.   Cardiovascular: Negative for chest pain, palpitations and leg swelling.  Gastrointestinal: Negative for abdominal pain, blood in stool, constipation, diarrhea, heartburn, nausea and vomiting.  Genitourinary: Negative for dysuria, frequency, hematuria and urgency.  Musculoskeletal: Negative for back pain, falls and myalgias.  Skin: Negative for rash.  Neurological: Negative for dizziness, sensory change, loss of consciousness, weakness and headaches.  Endo/Heme/Allergies: Negative for environmental allergies. Does not bruise/bleed easily.  Psychiatric/Behavioral: Negative for depression and suicidal ideas. The patient is not nervous/anxious and does not  have insomnia.        Objective:    Physical Exam Constitutional:      General: She is not in acute distress.    Appearance: She is not diaphoretic.  HENT:     Head: Normocephalic and atraumatic.     Right Ear: External ear normal.     Left Ear: External ear normal.     Nose: Nose normal.     Mouth/Throat:  Pharynx: No oropharyngeal exudate.  Eyes:     General: No scleral icterus.       Right eye: No discharge.        Left eye: No discharge.     Conjunctiva/sclera: Conjunctivae normal.     Pupils: Pupils are equal, round, and reactive to light.  Neck:     Musculoskeletal: Normal range of motion and neck supple.     Thyroid: No thyromegaly.  Cardiovascular:     Rate and Rhythm: Normal rate and regular rhythm.     Heart sounds: Normal heart sounds. No murmur.  Pulmonary:     Effort: Pulmonary effort is normal. No respiratory distress.     Breath sounds: Normal breath sounds. No wheezing or rales.  Abdominal:     General: Bowel sounds are normal. There is no distension.     Palpations: Abdomen is soft. There is no mass.     Tenderness: There is no abdominal tenderness.  Musculoskeletal: Normal range of motion.        General: No tenderness.  Lymphadenopathy:     Cervical: No cervical adenopathy.  Skin:    General: Skin is warm and dry.     Findings: No rash.  Neurological:     Mental Status: She is alert and oriented to person, place, and time.     Cranial Nerves: No cranial nerve deficit.     Coordination: Coordination normal.     Deep Tendon Reflexes: Reflexes are normal and symmetric. Reflexes normal.     BP 110/78 (BP Location: Left Arm, Patient Position: Sitting, Cuff Size: Normal)   Pulse 78   Temp (!) 97.1 F (36.2 C) (Temporal)   Resp 18   Wt 219 lb 3.2 oz (99.4 kg)   SpO2 98%   BMI 35.38 kg/m  Wt Readings from Last 3 Encounters:  09/10/19 219 lb 3.2 oz (99.4 kg)  02/05/19 210 lb 12.8 oz (95.6 kg)  06/26/18 216 lb 9.6 oz (98.2 kg)    Diabetic  Foot Exam - Simple   No data filed     Lab Results  Component Value Date   WBC 8.5 09/10/2019   HGB 13.4 09/10/2019   HCT 40.5 09/10/2019   PLT 294.0 09/10/2019   GLUCOSE 89 09/10/2019   CHOL 166 09/10/2019   TRIG 92.0 09/10/2019   HDL 52.80 09/10/2019   LDLCALC 95 09/10/2019   ALT 18 09/10/2019   AST 17 09/10/2019   NA 137 09/10/2019   K 4.8 09/10/2019   CL 103 09/10/2019   CREATININE 0.82 09/10/2019   BUN 14 09/10/2019   CO2 25 09/10/2019   TSH 2.73 09/10/2019   INR 1.04 09/17/2016   HGBA1C 5.5 09/10/2019   MICROALBUR <0.7 09/10/2019    Lab Results  Component Value Date   TSH 2.73 09/10/2019   Lab Results  Component Value Date   WBC 8.5 09/10/2019   HGB 13.4 09/10/2019   HCT 40.5 09/10/2019   MCV 96.8 09/10/2019   PLT 294.0 09/10/2019   Lab Results  Component Value Date   NA 137 09/10/2019   K 4.8 09/10/2019   CO2 25 09/10/2019   GLUCOSE 89 09/10/2019   BUN 14 09/10/2019   CREATININE 0.82 09/10/2019   BILITOT 0.8 09/10/2019   ALKPHOS 80 09/10/2019   AST 17 09/10/2019   ALT 18 09/10/2019   PROT 7.2 09/10/2019   ALBUMIN 4.3 09/10/2019   CALCIUM 9.4 09/10/2019   ANIONGAP 5 09/17/2016   GFR 74.06 09/10/2019  Lab Results  Component Value Date   CHOL 166 09/10/2019   Lab Results  Component Value Date   HDL 52.80 09/10/2019   Lab Results  Component Value Date   LDLCALC 95 09/10/2019   Lab Results  Component Value Date   TRIG 92.0 09/10/2019   Lab Results  Component Value Date   CHOLHDL 3 09/10/2019   Lab Results  Component Value Date   HGBA1C 5.5 09/10/2019       Assessment & Plan:   Problem List Items Addressed This Visit    Diabetes mellitus type 2 in obese ([place]) - Primary    hgba1c acceptable, minimize simple carbs. Increase exercise as tolerated. Continue current meds      Relevant Orders   Hemoglobin A1c (Completed)   Comprehensive metabolic panel (Completed)   TSH (Completed)   Microalbumin / creatinine urine ratio  (Completed)   Hyperlipidemia    Tolerating statin, encouraged heart healthy diet, avoid trans fats, minimize simple carbs and saturated fats. Increase exercise as tolerated      Relevant Orders   Lipid panel (Completed)   Vitamin D deficiency    Supplement and monitor      Relevant Orders   VITAMIN D 25 Hydroxy (Vit-D Deficiency, Fractures) (Completed)   Preventative health care    Patient encouraged to maintain heart healthy diet, regular exercise, adequate sleep. Consider daily probiotics. Take medications as prescribed. Labs ordered and reviewed. PAP up to date. Mammogram ordered.       Relevant Orders   CBC (Completed)   Microalbumin / creatinine urine ratio (Completed)   Chronic migraine w/o aura w/o status migrainosus, not intractable   Relevant Orders   CBC (Completed)    Other Visit Diagnoses    Encounter for screening for malignant neoplasm of breast, unspecified screening modality       Relevant Orders   MM 3D SCREEN BREAST BILATERAL   Needs flu shot       Relevant Orders   Flu Vaccine QUAD 6+ mos PF IM (Fluarix Quad PF) (Completed)      I am having Dr. Starlyn Skeans "[name]" maintain her fluticasone, Cetirizine HCl, nitrofurantoin (macrocrystal-monohydrate), Unifine Pentips, Ajovy, Erenumab-aooe, tretinoin, Saxenda, cyclobenzaprine, atorvastatin, and escitalopram.  No orders of the defined types were placed in this encounter.    Penni Homans, MD

## 2019-09-11 NOTE — Assessment & Plan Note (Signed)
Supplement and monitor 

## 2019-09-11 NOTE — Assessment & Plan Note (Signed)
Tolerating statin, encouraged heart healthy diet, avoid trans fats, minimize simple carbs and saturated fats. Increase exercise as tolerated 

## 2019-09-15 ENCOUNTER — Other Ambulatory Visit: Payer: Self-pay

## 2019-09-15 DIAGNOSIS — Z20828 Contact with and (suspected) exposure to other viral communicable diseases: Secondary | ICD-10-CM | POA: Diagnosis not present

## 2019-09-15 DIAGNOSIS — Z20822 Contact with and (suspected) exposure to covid-19: Secondary | ICD-10-CM

## 2019-09-17 LAB — NOVEL CORONAVIRUS, NAA: SARS-CoV-2, NAA: NOT DETECTED

## 2019-09-21 MED FILL — TRETINOIN 0.025% CREAM: 0.025 | 60 days supply | Qty: 45 | Fill #1

## 2019-09-21 MED FILL — AIMOVIG 140 MG/ML SOAJ: 140 | 30 days supply | Qty: 1 | Fill #3

## 2019-09-27 ENCOUNTER — Other Ambulatory Visit: Payer: Self-pay

## 2019-09-27 ENCOUNTER — Ambulatory Visit (INDEPENDENT_AMBULATORY_CARE_PROVIDER_SITE_OTHER): Payer: 59 | Admitting: Neurology

## 2019-09-27 VITALS — Temp 98.3°F

## 2019-09-27 DIAGNOSIS — G43709 Chronic migraine without aura, not intractable, without status migrainosus: Secondary | ICD-10-CM

## 2019-09-27 NOTE — Progress Notes (Signed)

## 2019-09-27 NOTE — Progress Notes (Signed)
Botox- 200 units x 1 vial Lot: [id number] Expiration: 05/2022 NDC: 0023-3921-02  Bacteriostatic 0.9% Sodium Chloride- 4mL total Lot: [id number] Expiration: 06/08/2020 NDC: 0409-1966-02  Dx: G43.709 B/B   

## 2019-10-05 MED FILL — SAXENDA 18 MG/3 ML PEN: 18 | 30 days supply | Qty: 15 | Fill #2

## 2019-10-07 ENCOUNTER — Telehealth: Payer: Self-pay

## 2019-10-07 NOTE — Telephone Encounter (Signed)
PA initiated via Covermymeds; KEY: [id number]. Awaiting determination.

## 2019-10-11 NOTE — Telephone Encounter (Signed)
PA approved.  The request has been approved. The authorization is effective for a maximum of 12 fills from to 10/09/2020, as long as the member is enrolled in their current health plan. The request was approved as submitted. This request is approved for 27mL (5 pens) per 30 days. A written notification letter will follow with additional details.

## 2019-10-21 ENCOUNTER — Encounter: Payer: Self-pay | Admitting: Family Medicine

## 2019-10-21 MED FILL — AIMOVIG 140 MG/ML SOAJ: 140 | 30 days supply | Qty: 1 | Fill #4

## 2019-11-17 MED FILL — SAXENDA 18 MG/3 ML PEN: 18 | 30 days supply | Qty: 15 | Fill #3

## 2019-11-17 NOTE — Telephone Encounter (Signed)
11/15/19  Received fax from [facility] requesting status of [place] PA. Spoke with Aaron Edelman and advised him of below approval dates. He tried to run claim and is still getting denial that PA is required. He will check with pt's insurance and let us know the outcome.

## 2019-11-20 MED FILL — AIMOVIG 140 MG/ML SOAJ: 140 | 30 days supply | Qty: 1 | Fill #5

## 2019-11-20 MED FILL — TRETINOIN 0.025% CREAM: 0.025 | 60 days supply | Qty: 45 | Fill #2

## 2019-11-22 MED FILL — ATORVASTATIN 10 MG TABLET: 10 | 90 days supply | Qty: 90 | Fill #1

## 2019-11-23 MED FILL — ESCITALOPRAM 20 MG TABLET: 20 | 90 days supply | Qty: 90 | Fill #1

## 2019-11-26 ENCOUNTER — Ambulatory Visit: Payer: 59 | Admitting: Family Medicine

## 2019-11-26 ENCOUNTER — Ambulatory Visit: Payer: Self-pay

## 2019-11-26 ENCOUNTER — Ambulatory Visit (INDEPENDENT_AMBULATORY_CARE_PROVIDER_SITE_OTHER): Payer: 59

## 2019-11-26 ENCOUNTER — Encounter: Payer: Self-pay | Admitting: Family Medicine

## 2019-11-26 ENCOUNTER — Ambulatory Visit (INDEPENDENT_AMBULATORY_CARE_PROVIDER_SITE_OTHER): Payer: 59 | Admitting: Family Medicine

## 2019-11-26 ENCOUNTER — Other Ambulatory Visit: Payer: Self-pay

## 2019-11-26 VITALS — BP 118/80 | HR 85 | Ht 66.0 in | Wt 224.4 lb

## 2019-11-26 DIAGNOSIS — G8929 Other chronic pain: Secondary | ICD-10-CM | POA: Diagnosis not present

## 2019-11-26 DIAGNOSIS — M25512 Pain in left shoulder: Secondary | ICD-10-CM

## 2019-11-26 NOTE — Patient Instructions (Addendum)
Please perform the exercise program that we have prepared for you and gone over in detail on a daily basis.  In addition to the handout you were provided you can access your program through: [email or web address]   Your unique program code is: [email or web address]   Attend PT.  Recheck back in 4-6 weeks unless all better.  Get xray on your way out.  I will send you results from radiology when I get them.

## 2019-11-26 NOTE — Progress Notes (Signed)
X-ray shoulder shows no significant arthritis or other issues.

## 2019-11-26 NOTE — Progress Notes (Signed)
Subjective:    CC: [name]  I, [name], LAT, ATC, am serving as scribe for Dr. Clementeen Graham.  HPI: Pt is a 47 y/o female presenting w/ c/o [name] x years that has worsened over the last month.  Pt locates her pain to her L lateral shoulder and reports radiating pain to her L elbow.  She denies any numbness/tingling into her L arm but does report weakness in her L shoulder.  She also notes neck pain.  Aggravating factors include L shoulder aBd.  Pt rates her [name] as a 4/10 at it's worst and describes it as an aching pain.  She has tried IBU w/ limited relief as well as rest.  Past medical history, Surgical history, Family history not pertinant except as noted below, Social history, Allergies, and medications have been entered into the medical record, reviewed, and no changes needed.   Review of Systems: No headache, visual changes, nausea, vomiting, diarrhea, constipation, dizziness, abdominal pain, skin rash, fevers, chills, night sweats, weight loss, swollen lymph nodes, body aches, joint swelling, muscle aches, chest pain, shortness of breath, mood changes, visual or auditory hallucinations.   Objective:    Vitals:   11/26/19 0828  BP: 118/80  Pulse: 85  SpO2: 96%   General: Well Developed, well nourished, and in no acute distress.  Neuro/Psych: Alert and oriented x3, extra-ocular muscles intact, able to move all 4 extremities, sensation grossly intact. Skin: Warm and dry, no rashes noted.  Respiratory: Not using accessory muscles, speaking in full sentences, trachea midline.  Cardiovascular: Pulses palpable, no extremity edema. Abdomen: Does not appear distended. MSK:  C-spine: Nontender to cervical midline.  Normal cervical motion.  Negative Spurling's test.  Upper extremity strength is equal normal throughout.  Reflexes intact bilateral upper extremities.  Left shoulder: Normal-appearing nontender. Normal motion pain with  abduction. Strength: Intact abduction external and internal rotation.  Some pain with resisted abduction strength testing. Positive empty can test.  Mildly positive Hawkins and Neer's test.  Negative Yergason's and speeds test.  Contralateral right shoulder normal-appearing Normal motion. Nontender.  Normal strength.  Negative impingement testing.  Pulses cap refill and station intact bilateral upper extremities.   Lab and Radiology Results  Limited musculoskeletal ultrasound left shoulder Normal-appearing biceps tendon intact in groove. Subscapularis tendon intact.  Some hyperechoic calcification present at distal tendon insertion. Supraspinatus tendon intact.  Again mild intermittent hyperechoic calcification present distal tendon insertion. Increased subacromial bursa thickness. Infraspinatus tendon normal-appearing. AC joint bone spur narrowing and mild effusion present. 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  X-ray images left shoulder obtained today personally and independently reviewed No significant glenohumeral or severe AC DJD.  No fractures.  X-ray largely normal-appearing. Await formal radiology review  Impression and Recommendations:    Assessment and Plan: 48 y.o. female with left shoulder pain.  Due to rotator cuff tendinitis.  Plan to proceed with home exercise program and referral to physical therapy.  Additionally recommend over-the-counter Voltaren gel.  Recheck back in 4 to 6 weeks unless completely better.  Return sooner if needed.  Precautions reviewed.  Patient expresses understanding and agreement..   Orders Placed This Encounter  Procedures  . NO CHG - [place] UPPER LEFT    Order Specific Question:   Reason for Exam (SYMPTOM  OR DIAGNOSIS REQUIRED)    Answer:   [name]    Order Specific Question:   Preferred  imaging location?    Answer:   [facility]  . DG Shoulder  Left    Standing Status:   Future    Standing Expiration Date:   01/26/2021    Order Specific Question:   Reason for Exam (SYMPTOM  OR DIAGNOSIS REQUIRED)    Answer:   eval shoulder pain. ? Impingment    Order Specific Question:   Is patient pregnant?    Answer:   No    Order Specific Question:   Preferred imaging location?    Answer:   [facility]    Order Specific Question:   Radiology Contrast Protocol - do NOT remove file path    Answer:   [email or web address]  . Ambulatory referral to Physical Therapy    Referral Priority:   Routine    Referral Type:   Physical Medicine    Referral Reason:   Specialty Services Required    Requested Specialty:   Physical Therapy   No orders of the defined types were placed in this encounter.   Discussed warning signs or symptoms. Please see discharge instructions. Patient expresses understanding.   The above documentation has been reviewed and is accurate and complete Lynne Leader

## 2019-12-17 ENCOUNTER — Encounter: Payer: Self-pay | Admitting: Family Medicine

## 2019-12-17 ENCOUNTER — Other Ambulatory Visit: Payer: Self-pay

## 2019-12-17 ENCOUNTER — Encounter: Payer: Self-pay | Admitting: Physical Therapy

## 2019-12-17 ENCOUNTER — Ambulatory Visit: Payer: 59 | Attending: Family Medicine | Admitting: Physical Therapy

## 2019-12-17 DIAGNOSIS — R293 Abnormal posture: Secondary | ICD-10-CM | POA: Insufficient documentation

## 2019-12-17 DIAGNOSIS — M25512 Pain in left shoulder: Secondary | ICD-10-CM | POA: Diagnosis not present

## 2019-12-17 DIAGNOSIS — G8929 Other chronic pain: Secondary | ICD-10-CM | POA: Insufficient documentation

## 2019-12-17 DIAGNOSIS — R252 Cramp and spasm: Secondary | ICD-10-CM | POA: Diagnosis not present

## 2019-12-17 NOTE — Therapy (Signed)
[facility] [address], [place], [place], [address] Phone: [phone]   Fax:  [phone]  Physical Therapy Evaluation  Patient Details  Name: Amanda James MRN: [id number] Date of Birth: 1971/07/15 Referring Provider (PT): Gregor Hams, MD   Encounter Date: 12/17/2019  PT End of Session - 12/17/19 1013    Visit Number  1    Date for PT Re-Evaluation  02/11/20    Authorization Type  [employer] [employer]    PT Start Time  [phone]    PT Stop Time  1000    PT Time Calculation (min)  50 min    Activity Tolerance  Patient tolerated treatment well;No increased pain    Behavior During Therapy  WFL for tasks assessed/performed       Past Medical History:  Diagnosis Date  . Allergy   . Cervical cancer screening 05/02/2017  . Depression   . Depression with anxiety   . Diabetes mellitus type 2 in obese ([place])   . Diabetes mellitus without complication ([place])   . History of chicken pox   . Hyperlipidemia   . Low back pain   . Migraines   . Obesity 11/10/2016    Past Surgical History:  Procedure Laterality Date  . ABDOMINOPLASTY  2012  . AUGMENTATION MAMMAPLASTY     maxilift with implants   . BLADDER SUSPENSION  2016  . LAPAROSCOPIC GASTRIC BANDING  2005   esophageal dilation. band still present but released  . MASTOPEXY  2012    There were no vitals filed for this visit.   Subjective Assessment - 12/17/19 0911    Subjective  Pt has had shoulder pain for years but worsened in Nov but unsure why.  Maybe slept on it funny.  Seems better since flare up but is a pattern and Pt wants to take care of it.    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  Yes    Pain Score  2     Pain Location  Shoulder     Pain Orientation  Lateral;Left    Pain Descriptors / Indicators  Aching    Pain Type  Chronic pain    Pain Radiating Towards  middle upper arm    Pain Onset  More than a month ago    Pain Frequency  Intermittent    Aggravating Factors   lifting arm out to side, sleeping on left side with arm up and under    Pain Relieving Factors  ibuprofen    Effect of Pain on Daily Activities  sleep, lifting when flared up, adjusts how does household tasks         [facility] PT Assessment - 12/17/19 0001      Assessment   Medical Diagnosis  M25.512,G89.29 (ICD-10-CM) - Chronic left shoulder pain    Referring Provider (PT)  Gregor Hams, MD    Onset Date/Surgical Date  --   Nov 2020   Hand Dominance  Right    Next MD Visit  as needed    Prior Therapy  no      Precautions   Precautions  None      Restrictions   Weight Bearing Restrictions  No  Balance Screen   Has the patient fallen in the past 6 months  No      Home Environment   Living Environment  Private residence    Living Arrangements  Spouse/significant other      Prior Function   Level of Independence  Independent    Vocation  Full time employment    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[employer]    Leisure  garden, crochet      Cognition   Overall Cognitive Status  Within Functional Limits for tasks assessed      Observation/Other Assessments   Observations  Lt shoulder rests elevated compared to Rt    Focus on Therapeutic Outcomes (FOTO)   41%   goal 31%     Posture/Postural Control   Posture/Postural Control  Postural limitations    Postural Limitations  Rounded Shoulders;Increased lumbar lordosis    Posture Comments  Lt shoulder elevated      ROM / Strength   AROM / PROM / Strength  AROM;Strength      AROM   Overall AROM Comments  Lt shoulder WNL with exception of Lt abd    AROM Assessment Site  Shoulder;Cervical    Right/Left Shoulder  Right;Left    Right Shoulder ABduction  170 Degrees     Left Shoulder ABduction  160 Degrees    Left Shoulder Internal Rotation  --   symmetrical and WNF with anterior shoulder pain on Lt   Cervical Flexion  55    Cervical Extension  55    Cervical - Right Side Bend  40    Cervical - Left Side Bend  50    Cervical - Right Rotation  75    Cervical - Left Rotation  60      Strength   Overall Strength Comments  Rt UE strong and painless throughout, Lt shoulder and elbow strong and painless thorughout with exception of pain with shoulder IR, flexion, elbow flexion, shoulder flexion      Flexibility   Soft Tissue Assessment /Muscle Length  yes   limited Lt upper trap     Palpation   Spinal mobility  limited thoracic extension T1-T4    Palpation comment  Lt shoulder tenderness: long head bicep tendon, infraspinatus tendon, posterior delt, lat proximally, pectorals, upper trap      Special Tests    Special Tests  Rotator Cuff Impingement    Rotator Cuff Impingment tests  Neer impingement test;Hawkins- Kennedy test;Empty Can test      Neer Impingement test    Findings  Positive    Side  Left      Hawkins-Kennedy test   Findings  Positive    Side  Left      Empty Can test   Findings  Positive    Side  Left    Comment  for pain                Objective measurements completed on examination: See above findings.      [facility] Adult PT Treatment/Exercise - 12/17/19 0001      Self-Care   Self-Care  Posture;Other Self-Care Comments    Posture  sleeping UE position (avoid abducted internally rotated shoulder under pillow, at least aim for palm up)    Other Self-Care Comments   DN aftercare, handout given       Trigger Point Dry Needling - 12/17/19 0001    Consent Given?  Yes    Education Handout  Provided  Yes    Muscles Treated Head and Neck  Upper trapezius    Dry Needling Comments  Left, anterior and posterior approach    Upper Trapezius Response  Twitch reponse elicited;Palpable increased muscle length            PT Education - 12/17/19 1013    Education Details  Access Code: [email or web address]    Person(s) Educated  Patient    Methods  Explanation;Demonstration;Verbal cues;Handout    Comprehension  Verbalized understanding;Returned demonstration       PT Short Term Goals - 12/17/19 1014      PT SHORT TERM GOAL #1   Title  Pt will report compliance with adjusted UE position when sleeping to avoid impingement    Time  2    Period  Weeks    Status  New    Target Date  12/31/19      PT SHORT TERM GOAL #2   Title  Pt will be ind with initial HEP for ROM, stretching and postural strength.    Time  4    Period  Weeks    Status  New    Target Date  01/14/20        PT Long Term Goals - 12/17/19 1015      PT LONG TERM GOAL #1   Title  Pt will be ind in advanced HEP and understand how to safely progress    Time  8    Period  Weeks    Status  New    Target Date  02/11/20      PT LONG TERM GOAL #2   Title  Pt will achieve full, painfree motion symmetrical to Rt UE  in all planes for Lt shoulder to imrpove overhead reaching and dressing.    Time  8    Period  Weeks    Status  New    Target Date  02/11/20      PT LONG TERM GOAL #3   Title  Pt will achieve painfree Lt shoulder and elbow strength 5/5 to improve heavier household activity performance.    Time  8    Period  Weeks    Status  New    Target Date  02/11/20      PT LONG TERM GOAL #4   Title  Pt will reduce FOTO to </= 31% to demo less limitation    Time  8    Period  Weeks    Status  New    Target Date  02/11/20      PT LONG TERM GOAL #5   Title  Pt will report at least 70% improvement in Lt shoulder pain during both daily tasks and sleep.    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  Recent flare up in Nov but has eased back to baseline.  She presents with signs and symptoms of Lt anterior shoulder impingement with tenderness and strong but painful strength in long head of bicep tendon and subscapularis tendon. She has end range stiffness in shoulder flexion on Lt lacking 10 deg compared to Rt (160 vs 170).  She also reports pain with end range IR, resisted elbow flexion, shoulder flexion and shoulder IR.  Neer's, Hawkin's Kennedy, and Empty Can are all + for pain.  She has postural deficits including rounded shoulders and upper thoracic kyphosis with stiffness into extension which likely contribute to anterior shoulder impingement.  PT performed DN to Lt upper trap as shoulder sits elevated and cervical ROM is limited into Rt SB and Lt Rot.  PT began scapular ther ex for postural re-ed and cervical stretching today.  PT also educated Pt on proper sleeping postures to avoid exacerbation of impingement.  Pt will benefit from skilled PT to address these deficits and improve tolerance of daily tasks and sleep.    Personal Factors and Comorbidities  Time since onset of injury/illness/exacerbation    Examination-Activity Limitations  Reach Overhead;Sleep;Lift;Dressing;Carry    Examination-Participation Restrictions  Cleaning;Laundry;Yard Work;Meal Prep    Stability/Clinical Decision Making  Stable/Uncomplicated    Clinical Decision Making  Low    Rehab Potential  Excellent    PT Frequency  1x / week    PT Duration  8 weeks    PT Treatment/Interventions  ADLs/Self Care Home Management;Cryotherapy;Electrical Stimulation;Iontophoresis 4mg /ml Dexamethasone;Moist Heat;Traction;Functional mobility training;Therapeutic activities;Therapeutic exercise;Neuromuscular re-education;Patient/family education;Manual techniques;Passive range of motion;Dry needling;Taping;Joint Manipulations;Spinal Manipulations    PT Next Visit Plan  f/u on DN to Lt upper trap, cont cervical stretching, progress  scapular and shoulder strength as tol, if painfree, wall slides or supine dowel end range flexion ROM, ionto bicep tendon if cert signed    PT Home Exercise Plan  Access Code: [email or web address]    Recommended Other Services  is cert signed? DN posterior shoulder as needed    Consulted and Agree with Plan of Care  Patient       Patient will benefit from skilled therapeutic intervention in order to improve the following deficits and impairments:  Decreased range of motion, Increased muscle spasms, Decreased activity tolerance, Pain, Hypomobility, Impaired flexibility, Postural dysfunction, Decreased strength  Visit Diagnosis: Chronic left shoulder pain - Plan: PT plan of care cert/re-cert  Abnormal posture - Plan: PT plan of care cert/re-cert  Cramp and spasm - Plan: PT plan of care cert/re-cert     Problem List Patient Active Problem List   Diagnosis Date Noted  . Chronic bilateral low back pain without sciatica 06/28/2018  . Cervical cancer screening 05/02/2017  . Chronic migraine w/o aura w/o status migrainosus, not intractable 04/06/2017  . Obesity 11/10/2016  . Vitamin D deficiency 11/10/2016  . Preventative health care 11/10/2016  . History of chicken pox   . Allergy   . Diabetes mellitus type 2 in obese ([facility])   . Hyperlipidemia    14/02/2016, PT 12/17/19 10:30 AM   [facility] [address], [address] [place], [place], [place] Phone: [phone]   Fax:  [phone]  Name: NEOMIA HERBEL MRN: Wilder Glade Date of Birth: 1971-04-23

## 2019-12-17 NOTE — Patient Instructions (Signed)
Access Code: [email or web address]  URL: [email or web address]  Date: 12/17/2019  Prepared by: Loistine Simas Dontravious Camille   Exercises  Standing Backward Shoulder Rolls - 10 reps - 3 sets - 1x daily - 7x weekly  Seated Scapular Retraction - 10 reps - 3 sets - 3 hold - 1x daily - 7x weekly  Seated Cervical Sidebending Stretch - 2 reps - 2 sets - 20 hold - 2x daily - 7x weekly  Patient Education  Trigger Point Dry Needling

## 2019-12-24 ENCOUNTER — Encounter: Payer: Self-pay | Admitting: Physical Therapy

## 2019-12-24 ENCOUNTER — Ambulatory Visit: Payer: 59 | Admitting: Physical Therapy

## 2019-12-24 ENCOUNTER — Ambulatory Visit: Payer: 59 | Admitting: Family Medicine

## 2019-12-24 ENCOUNTER — Encounter: Payer: Self-pay | Admitting: Family Medicine

## 2019-12-24 ENCOUNTER — Other Ambulatory Visit: Payer: Self-pay

## 2019-12-24 ENCOUNTER — Ambulatory Visit (INDEPENDENT_AMBULATORY_CARE_PROVIDER_SITE_OTHER): Payer: 59 | Admitting: Family Medicine

## 2019-12-24 DIAGNOSIS — N390 Urinary tract infection, site not specified: Secondary | ICD-10-CM | POA: Diagnosis not present

## 2019-12-24 DIAGNOSIS — E6609 Other obesity due to excess calories: Secondary | ICD-10-CM

## 2019-12-24 DIAGNOSIS — M25512 Pain in left shoulder: Secondary | ICD-10-CM | POA: Diagnosis not present

## 2019-12-24 DIAGNOSIS — G43709 Chronic migraine without aura, not intractable, without status migrainosus: Secondary | ICD-10-CM

## 2019-12-24 DIAGNOSIS — M545 Low back pain, unspecified: Secondary | ICD-10-CM

## 2019-12-24 DIAGNOSIS — G8929 Other chronic pain: Secondary | ICD-10-CM | POA: Diagnosis not present

## 2019-12-24 DIAGNOSIS — E782 Mixed hyperlipidemia: Secondary | ICD-10-CM

## 2019-12-24 DIAGNOSIS — E559 Vitamin D deficiency, unspecified: Secondary | ICD-10-CM | POA: Diagnosis not present

## 2019-12-24 DIAGNOSIS — R252 Cramp and spasm: Secondary | ICD-10-CM

## 2019-12-24 DIAGNOSIS — E1169 Type 2 diabetes mellitus with other specified complication: Secondary | ICD-10-CM

## 2019-12-24 DIAGNOSIS — E669 Obesity, unspecified: Secondary | ICD-10-CM

## 2019-12-24 DIAGNOSIS — R293 Abnormal posture: Secondary | ICD-10-CM

## 2019-12-24 MED ORDER — NITROFURANTOIN MONOHYD MACRO 100 MG PO CAPS: 100.0000 mg | ORAL_CAPSULE | Freq: Two times a day (BID) | ORAL | 1 refills | Status: DC | PRN

## 2019-12-24 MED FILL — NITROFURANTOIN MONO-MCR 100: 100 | 10 days supply | Qty: 20 | Fill #0

## 2019-12-24 NOTE — Patient Instructions (Signed)
Access Code: [email or web address]  URL: [email or web address]  Date: 12/24/2019  Prepared by: Lavinia Sharps   Exercises Standing Backward Shoulder Rolls - 10 reps - 3 sets - 1x daily - 7x weekly Seated Scapular Retraction - 10 reps - 3 sets - 3 hold - 1x daily - 7x weekly Seated Cervical Sidebending Stretch - 2 reps - 2 sets - 20 hold - 2x daily - 7x weekly Supine Shoulder Flexion Overhead with Dowel - 3 reps - 1 sets - 20 hold - 1x daily - 7x weekly Prone Shoulder Extension - Single Arm - 10 reps - 1 sets - 1x daily                            - 7x weekly Prone Shoulder Horizontal Abduction - 10 reps - 1 sets - 1x daily - 7x weekly Prone Single Arm Shoulder Y - 10 reps - 1 sets - 1x daily - 7x weekly Standing Low Shoulder Row with Anchored Resistance - 10 reps - 1 sets - 1x daily - 7x weekly Patient Education Trigger Point Dry Needling   IONTOPHORESIS PATIENT PRECAUTIONS & CONTRAINDICATIONS:  . Redness under one or both electrodes can occur.  This characterized by a uniform redness that usually disappears within 12 hours of treatment. . Small pinhead size blisters may result in response to the drug.  Contact your physician if the problem persists more than 24 hours. . On rare occasions, iontophoresis therapy can result in temporary skin reactions such as rash, inflammation, irritation or burns.  The skin reactions may be the result of individual sensitivity to the ionic solution used, the condition of the skin at the start of treatment, reaction to the materials in the electrodes, allergies or sensitivity to dexamethasone, or a poor connection between the patch and your skin.  Discontinue using iontophoresis if you have any of these reactions and report to your therapist. . Remove the Patch or electrodes if you have any undue sensation of pain or burning during the treatment and report discomfort to your therapist. . Tell your Therapist if you have had known adverse reactions to the  application of electrical current. . If using the Patch, the LED light will turn off when treatment is complete and the patch can be removed.  Approximate treatment time is 1-3 hours.  Remove the patch when light goes off or after 6 hours. . The Patch can be worn during normal activity, however excessive motion where the electrodes have been placed can cause poor contact between the skin and the electrode or uneven electrical current resulting in greater risk of skin irritation. [name] Keep out of the reach of children.   . DO NOT use if you have a cardiac pacemaker or any other electrically sensitive implanted device. . DO NOT use if you have a known sensitivity to dexamethasone. . DO NOT use during Magnetic Resonance Imaging (MRI). . DO NOT use over broken or compromised skin (e.g. sunburn, cuts, or acne) due to the increased risk of skin reaction. . DO NOT SHAVE over the area to be treated:  To establish good contact between the Patch and the skin, excessive hair may be clipped. . DO NOT place the Patch or electrodes on or over your eyes, directly over your heart, or brain. . DO NOT reuse the Patch or electrodes as this may cause burns to occur.

## 2019-12-24 NOTE — Progress Notes (Signed)
Refill on macrobid Will start Physical therapy  on shoulder  Wants to discuss back pain also

## 2019-12-24 NOTE — Therapy (Signed)
[facility] [address] [place], [place], [address] Phone: [phone]   Fax:  [phone]  Physical Therapy Treatment  Patient Details  Name: Amanda James MRN: [id number] Date of Birth: Jan 23, 1971 Referring Provider (PT): Rodolph Bong, MD   Encounter Date: 12/24/2019  PT End of Session - 12/24/19 1027    Visit Number  2    Date for PT Re-Evaluation  02/11/20    Authorization Type  [facility] [employer]    PT Start Time  [phone]    PT Stop Time  0930    PT Time Calculation (min)  43 min    Activity Tolerance  Patient tolerated treatment well;No increased pain       Past Medical History:  Diagnosis Date  . Allergy   . Cervical cancer screening 05/02/2017  . Depression   . Depression with anxiety   . Diabetes mellitus type 2 in obese ([facility])   . Diabetes mellitus without complication ([facility])   . History of chicken pox   . Hyperlipidemia   . Low back pain   . Migraines   . Obesity 11/10/2016    Past Surgical History:  Procedure Laterality Date  . ABDOMINOPLASTY  2012  . AUGMENTATION MAMMAPLASTY     maxilift with implants   . BLADDER SUSPENSION  2016  . LAPAROSCOPIC GASTRIC BANDING  2005   esophageal dilation. band still present but released  . MASTOPEXY  2012    There were no vitals filed for this visit.  Subjective Assessment - 12/24/19 0852    Subjective  Good week.  Some neck tightness on the other side.    Currently in Pain?  No/denies    Pain Score  0-No pain    Pain Location  Shoulder    Pain Orientation  Left    Pain Type  Chronic pain                       [facility] Adult PT Treatment/Exercise - 12/24/19 0001      Shoulder Exercises: Supine   Other Supine Exercises  cane flexion and hold 20 sec 3x       Shoulder Exercises: Seated   Other Seated Exercises  thoracic extension with ball 15x       Shoulder Exercises: Prone   Extension  AROM;Left;10 reps    Horizontal ABduction 1   AROM;Left;10 reps    Horizontal ABduction 2  AROM;Left;10 reps      Shoulder Exercises: Standing   Extension  Strengthening;Both;10 reps    Theraband Level (Shoulder Extension)  Level 3 (Green)    Row  Both;10 reps;Theraband    Theraband Level (Shoulder Row)  Level 3 (Green)      Iontophoresis   Type of Iontophoresis  Dexamethasone    Location  left biceps tendon proximal     Dose  4 mg/ml     Time  4-6 hour patch       Manual Therapy   Joint Mobilization  left glenohumeral joint distraction, inferior and posterior mobs grade 3 3x 30 sec;  inferior mobs with movement mid and endrange 10x              PT Education - 12/24/19 1026    Education Details  Access Code: [email or web address] Prone I,T, Y;  green band rows bil and extensions;  ionto instructions    Person(s) Educated  Patient    Methods  Explanation;Demonstration;Handout    Comprehension  Returned demonstration;Verbalized understanding       PT Short Term Goals - 12/17/19 1014      PT SHORT TERM GOAL #1   Title  Pt will report compliance with adjusted UE position when sleeping to avoid impingement    Time  2    Period  Weeks    Status  New    Target Date  12/31/19      PT SHORT TERM GOAL #2   Title  Pt will be ind with initial HEP for ROM, stretching and postural strength.    Time  4    Period  Weeks    Status  New    Target Date  01/14/20        PT Long Term Goals - 12/17/19 1015      PT LONG TERM GOAL #1   Title  Pt will be ind in advanced HEP and understand how to safely progress    Time  8    Period  Weeks    Status  New    Target Date  02/11/20      PT LONG TERM GOAL #2   Title  Pt will achieve full, painfree motion symmetrical to Rt UE  in all planes for Lt shoulder to imrpove overhead reaching and dressing.    Time  8    Period  Weeks    Status  New    Target Date  02/11/20      PT LONG TERM GOAL #3   Title  Pt will achieve painfree Lt shoulder and elbow strength 5/5 to improve heavier  household activity performance.    Time  8    Period  Weeks    Status  New    Target Date  02/11/20      PT LONG TERM GOAL #4   Title  Pt will reduce FOTO to </= 31% to demo less limitation    Time  8    Period  Weeks    Status  New    Target Date  02/11/20      PT LONG TERM GOAL #5   Title  Pt will report at least 70% improvement in Lt shoulder pain during both daily tasks and sleep.    Time  8    Period  Weeks    Status  New    Target Date  02/11/20            Plan - 12/24/19 0907    Clinical Impression Statement  Empty endfeel with shoulder ROM rather than joint or soft tissue restriction at endrange.  Verbal and tactile cues to activate scapular depressors and retractors and avoid compensatory shoulder hike.  Patient is receptive to education scapular muscle activation and the biomechanical influence of thoracic extension with shoulder elevation.  She expresses understanding of the iontophoresis patch removal and expected responses.    Rehab Potential  Excellent    PT Frequency  1x / week    PT Duration  8 weeks    PT Treatment/Interventions  ADLs/Self Care Home Management;Cryotherapy;Electrical Stimulation;Iontophoresis 4mg /ml Dexamethasone;Moist Heat;Traction;Functional mobility training;Therapeutic activities;Therapeutic exercise;Neuromuscular re-education;Patient/family education;Manual techniques;Passive range of motion;Dry needling;Taping;Joint Manipulations;Spinal Manipulations    PT Next Visit Plan  assess response to ionto to left biceps;   DN to Lt upper trap, cont cervical stretching, progress scapular and shoulder strength    PT Home Exercise Plan  Access Code: [email or web address]       Patient will benefit from skilled therapeutic intervention in order  to improve the following deficits and impairments:  Decreased range of motion, Increased muscle spasms, Decreased activity tolerance, Pain, Hypomobility, Impaired flexibility, Postural dysfunction, Decreased  strength  Visit Diagnosis: Chronic left shoulder pain  Abnormal posture  Cramp and spasm     Problem List Patient Active Problem List   Diagnosis Date Noted  . Chronic bilateral low back pain without sciatica 06/28/2018  . Cervical cancer screening 05/02/2017  . Chronic migraine w/o aura w/o status migrainosus, not intractable 04/06/2017  . Obesity 11/10/2016  . Vitamin D deficiency 11/10/2016  . Preventative health care 11/10/2016  . History of chicken pox   . Allergy   . Diabetes mellitus type 2 in obese ([facility])   . Hyperlipidemia    Lavinia Sharps, PT 12/24/19 10:39 AM Phone: [phone] Fax: [phone] Vivien Presto 12/24/2019, 10:38 AM  [facility] [address], [address] [place], [place], [address] Phone: [phone]   Fax:  [phone]  Name: Amanda James MRN: [id number] Date of Birth: 1971/06/30

## 2019-12-26 DIAGNOSIS — M25512 Pain in left shoulder: Secondary | ICD-10-CM | POA: Insufficient documentation

## 2019-12-26 DIAGNOSIS — N390 Urinary tract infection, site not specified: Secondary | ICD-10-CM | POA: Insufficient documentation

## 2019-12-26 NOTE — Assessment & Plan Note (Signed)
Tolerating statin, encouraged heart healthy diet, avoid trans fats, minimize simple carbs and saturated fats. Increase exercise as tolerated 

## 2019-12-26 NOTE — Assessment & Plan Note (Signed)
Continues to bother her. Encouraged moist heat and gentle stretching as tolerated. May try NSAIDs and prescription meds as directed and report if symptoms worsen or seek immediate care. She is interested in a course of physical therapy for this once she finishes the PT for her shoulder.

## 2019-12-26 NOTE — Assessment & Plan Note (Signed)
Following with [facility], Dr Lucia Gaskins, has responded well to a combination of Aimovig and Botox

## 2019-12-26 NOTE — Assessment & Plan Note (Signed)
Is following with sports medicine and physical therapy and doing much better, likely a combination of bursitis and rotator cuff injury

## 2019-12-26 NOTE — Assessment & Plan Note (Signed)
hgba1c acceptable, minimize simple carbs. Increase exercise as tolerated. Continue current meds 

## 2019-12-26 NOTE — Assessment & Plan Note (Signed)
Given refill on Macrobid to use prn

## 2019-12-26 NOTE — Assessment & Plan Note (Signed)
Struggled over [employer] but she feels she is doing better now and managing her diet well

## 2019-12-26 NOTE — Progress Notes (Signed)
Patient ID: Amanda James, female   DOB: 05/10/1971, 49 y.o.   MRN: [id number] Virtual Visit via Video Note  I connected with Amanda James on 12/24/19 at 10:40 AM EST by a video enabled telemedicine application and verified that I am speaking with the correct person using two identifiers.  Location: Patient: home Provider: home   I discussed the limitations of evaluation and management by telemedicine and the availability of in person appointments. The patient expressed understanding and agreed to proceed. Amanda James, CMA was able to get the patient set up on a visit, video   Subjective:    Patient ID: Amanda James, female    DOB: 06-02-1971, 49 y.o.   MRN: [id number]  No chief complaint on file.   HPI Patient is in today for follow up on chronic health conditions including diabetes, hyperlipidemia, vitamin D deficiency and more. 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 No recent febrile illness or hospitalizations. She is noting trouble with diet over the holidays but doing better now. Denies CP/palp/SOB/HA/congestion/fevers/GI or GU c/o. Taking meds as prescribed. She has been having trouble with left shoulder pain and has been working with sports medicine and physical therapy and it is improving. She also has chronic low back pain. No recent injury or incontinence noted.   Past Medical History:  Diagnosis Date  . Allergy   . Cervical cancer screening 05/02/2017  . Depression   . Depression with anxiety   . Diabetes mellitus type 2 in obese ([place])   . Diabetes mellitus without complication ([place])   . History of chicken pox   . Hyperlipidemia   . Low back pain   . Migraines   . Obesity 11/10/2016    Past Surgical History:  Procedure Laterality Date  . ABDOMINOPLASTY  2012  . AUGMENTATION MAMMAPLASTY     maxilift with implants   . BLADDER SUSPENSION  2016  . LAPAROSCOPIC GASTRIC BANDING  2005   esophageal dilation. band still present but released    . MASTOPEXY  2012    Family History  Problem Relation Age of Onset  . Diabetes Mother   . Hypertension Mother   . Hyperlipidemia Mother   . Obesity Mother   . Heart disease Mother        pacer  . Diabetes Father   . Heart disease Father        MI  . Hyperlipidemia Father   . Hypertension Father   . Obesity Father   . Diabetes Sister   . Hyperlipidemia Sister   . Hypertension Sister   . Obesity Sister   . Obesity Maternal Grandmother   . Hypertension Maternal Grandmother   . Hyperlipidemia Maternal Grandmother   . Diabetes Maternal Grandmother   . Rosacea Maternal Grandmother   . Non-Hodgkin's lymphoma Maternal Grandmother   . Arthritis Maternal Grandmother        spinal stenosis  . Multiple myeloma Maternal Grandfather   . Hyperlipidemia Paternal Grandmother        rhabdo from statins  . Kidney disease Paternal Grandmother   . Migraines Neg Hx     Social History   Socioeconomic History  . Marital status: Married    Spouse name: Not on file  . Number of children: 1  . Years of education: MD  . Highest education level: Not on file  Occupational History  . Occupation: [employer]  Tobacco Use  . Smoking status: Never Smoker  .  Smokeless tobacco: Never Used  Substance and Sexual Activity  . Alcohol use: Yes    Alcohol/week: 5.0 standard drinks    Types: 5 Glasses of wine per week    Comment: 3-5 glasses of wine per week  . Drug use: No  . Sexual activity: Not on file  Other Topics Concern  . Not on file  Social History Narrative   Works With [employer], lives with husband, 3 cats   No major dietary restrictions. Exercise 30 minutes 3 x a week   Seat belts routinely   Right-handed   Caffeine: 1-2 cups of coffee per day   Social Determinants of Health   Financial Resource Strain:   . Difficulty of Paying Living Expenses: Not on file  Food Insecurity:   . Worried About Charity fundraiser in the Last Year: Not on file  . Ran Out of Food in the Last Year:  Not on file  Transportation Needs:   . Lack of Transportation (Medical): Not on file  . Lack of Transportation (Non-Medical): Not on file  Physical Activity:   . Days of Exercise per Week: Not on file  . Minutes of Exercise per Session: Not on file  Stress:   . Feeling of Stress : Not on file  Social Connections:   . Frequency of Communication with Friends and Family: Not on file  . Frequency of Social Gatherings with Friends and Family: Not on file  . Attends Religious Services: Not on file  . Active Member of Clubs or Organizations: Not on file  . Attends Archivist Meetings: Not on file  . Marital Status: Not on file  Intimate Partner Violence:   . Fear of Current or Ex-Partner: Not on file  . Emotionally Abused: Not on file  . Physically Abused: Not on file  . Sexually Abused: Not on file    Outpatient Medications Prior to Visit  Medication Sig Dispense Refill  . atorvastatin (LIPITOR) 10 MG tablet Take 1 tablet (10 mg total) by mouth daily. 90 tablet 1  . Cetirizine HCl (ZYRTEC ALLERGY) 10 MG CAPS Take 1 capsule by mouth daily.    . cyclobenzaprine (FLEXERIL) 10 MG tablet TAKE 1 TABLET (10 MG TOTAL) BY MOUTH 3 (THREE) TIMES DAILY AS NEEDED FOR MUSCLE SPASMS. 30 tablet 2  . Erenumab-aooe (AIMOVIG) 140 MG/ML SOAJ Inject 140 mg into the skin every 30 (thirty) days. 1 pen 5  . escitalopram (LEXAPRO) 20 MG tablet TAKE 1 TABLET BY MOUTH ONCE DAILY 90 tablet 1  . fluticasone (FLONASE) 50 MCG/ACT nasal spray Place 1 spray into both nostrils 2 (two) times daily.    [name] SAXENDA 18 MG/3ML SOPN INJECT 3 MG INTO THE SKIN DAILY. 15 mL 5  . tretinoin (RETIN-A) 0.025 % cream Apply topically at bedtime. To affected areas 45 g 5  . UNIFINE PENTIPS 32G X 4 MM MISC USE WITH VICTOZA 100 each 1  . nitrofurantoin, macrocrystal-monohydrate, (MACROBID) 100 MG capsule Take 1 capsule (100 mg total) by mouth 2 (two) times daily as needed. 20 capsule 1   No facility-administered medications prior  to visit.    Allergies  Allergen Reactions  . Erythromycin Diarrhea    Review of Systems  Constitutional: Positive for malaise/fatigue. Negative for fever.  HENT: Negative for congestion.   Eyes: Negative for blurred vision.  Respiratory: Negative for shortness of breath.   Cardiovascular: Negative for chest pain, palpitations and leg swelling.  Gastrointestinal: Negative for abdominal pain, blood in stool and  nausea.  Genitourinary: Negative for dysuria and frequency.  Musculoskeletal: Positive for back pain and joint pain. Negative for falls.  Skin: Negative for rash.  Neurological: Negative for dizziness, loss of consciousness and headaches.  Endo/Heme/Allergies: Negative for environmental allergies.  Psychiatric/Behavioral: Negative for depression. The patient is not nervous/anxious.        Objective:    Physical Exam  Wt 217 lb (98.4 kg)   BMI 35.02 kg/m  Wt Readings from Last 3 Encounters:  12/24/19 217 lb (98.4 kg)  11/26/19 224 lb 6.4 oz (101.8 kg)  09/10/19 219 lb 3.2 oz (99.4 kg)    Diabetic Foot Exam - Simple   No data filed     Lab Results  Component Value Date   WBC 8.5 09/10/2019   HGB 13.4 09/10/2019   HCT 40.5 09/10/2019   PLT 294.0 09/10/2019   GLUCOSE 89 09/10/2019   CHOL 166 09/10/2019   TRIG 92.0 09/10/2019   HDL 52.80 09/10/2019   LDLCALC 95 09/10/2019   ALT 18 09/10/2019   AST 17 09/10/2019   NA 137 09/10/2019   K 4.8 09/10/2019   CL 103 09/10/2019   CREATININE 0.82 09/10/2019   BUN 14 09/10/2019   CO2 25 09/10/2019   TSH 2.73 09/10/2019   INR 1.04 09/17/2016   HGBA1C 5.5 09/10/2019   MICROALBUR <0.7 09/10/2019    Lab Results  Component Value Date   TSH 2.73 09/10/2019   Lab Results  Component Value Date   WBC 8.5 09/10/2019   HGB 13.4 09/10/2019   HCT 40.5 09/10/2019   MCV 96.8 09/10/2019   PLT 294.0 09/10/2019   Lab Results  Component Value Date   NA 137 09/10/2019   K 4.8 09/10/2019   CO2 25 09/10/2019    GLUCOSE 89 09/10/2019   BUN 14 09/10/2019   CREATININE 0.82 09/10/2019   BILITOT 0.8 09/10/2019   ALKPHOS 80 09/10/2019   AST 17 09/10/2019   ALT 18 09/10/2019   PROT 7.2 09/10/2019   ALBUMIN 4.3 09/10/2019   CALCIUM 9.4 09/10/2019   ANIONGAP 5 09/17/2016   GFR 74.06 09/10/2019   Lab Results  Component Value Date   CHOL 166 09/10/2019   Lab Results  Component Value Date   HDL 52.80 09/10/2019   Lab Results  Component Value Date   LDLCALC 95 09/10/2019   Lab Results  Component Value Date   TRIG 92.0 09/10/2019   Lab Results  Component Value Date   CHOLHDL 3 09/10/2019   Lab Results  Component Value Date   HGBA1C 5.5 09/10/2019       Assessment & Plan:   Problem List Items Addressed This Visit    Diabetes mellitus type 2 in obese ([place])    hgba1c acceptable, minimize simple carbs. Increase exercise as tolerated. Continue current meds      Hyperlipidemia    Tolerating statin, encouraged heart healthy diet, avoid trans fats, minimize simple carbs and saturated fats. Increase exercise as tolerated      Obesity    Struggled over [employer] but she feels she is doing better now and managing her diet well      Vitamin D deficiency    Supplement and monitor      Chronic migraine w/o aura w/o status migrainosus, not intractable    Following with [facility], Dr Jaynee Eagles, has responded well to a combination of Aimovig and Botox      Chronic bilateral low back pain without sciatica    Continues to  bother her. Encouraged moist heat and gentle stretching as tolerated. May try NSAIDs and prescription meds as directed and report if symptoms worsen or seek immediate care. She is interested in a course of physical therapy for this once she finishes the PT for her shoulder.       Recurrent UTI    Given refill on Macrobid to use prn      Relevant Medications   nitrofurantoin, macrocrystal-monohydrate, (MACROBID) 100 MG capsule   Shoulder pain, left    Is following with  sports medicine and physical therapy and doing much better, likely a combination of bursitis and rotator cuff injury         I am having Dr. Starlyn Skeans "[name]" maintain her fluticasone, Cetirizine HCl, Unifine Pentips, Erenumab-aooe, tretinoin, Saxenda, cyclobenzaprine, atorvastatin, escitalopram, and nitrofurantoin (macrocrystal-monohydrate).  Meds ordered this encounter  Medications  . nitrofurantoin, macrocrystal-monohydrate, (MACROBID) 100 MG capsule    Sig: Take 1 capsule (100 mg total) by mouth 2 (two) times daily as needed.    Dispense:  20 capsule    Refill:  1     I discussed the assessment and treatment plan with the patient. The patient was provided an opportunity to ask questions and all were answered. The patient agreed with the plan and demonstrated an understanding of the instructions.   The patient was advised to call back or seek an in-person evaluation if the symptoms worsen or if the condition fails to improve as anticipated.  I provided 25 minutes of non-face-to-face time during this encounter.   Penni Homans, MD

## 2019-12-26 NOTE — Assessment & Plan Note (Signed)
Supplement and monitor 

## 2019-12-29 ENCOUNTER — Ambulatory Visit (INDEPENDENT_AMBULATORY_CARE_PROVIDER_SITE_OTHER): Payer: 59 | Admitting: Neurology

## 2019-12-29 ENCOUNTER — Other Ambulatory Visit: Payer: Self-pay

## 2019-12-29 VITALS — Temp 97.3°F

## 2019-12-29 DIAGNOSIS — G43709 Chronic migraine without aura, not intractable, without status migrainosus: Secondary | ICD-10-CM

## 2019-12-29 NOTE — Progress Notes (Signed)
Botox- 200 units x 1 vial Lot: [id number] Expiration: 08/2022 NDC: 0023-3921-02  Bacteriostatic 0.9% Sodium Chloride- 4mL total Lot: [id number] Expiration: 03/09/2020 NDC: 0409-1966-02  Dx: G43.709 B/B   

## 2019-12-31 ENCOUNTER — Ambulatory Visit: Payer: 59 | Admitting: Physical Therapy

## 2019-12-31 ENCOUNTER — Other Ambulatory Visit: Payer: Self-pay

## 2019-12-31 ENCOUNTER — Encounter: Payer: Self-pay | Admitting: Physical Therapy

## 2019-12-31 DIAGNOSIS — R293 Abnormal posture: Secondary | ICD-10-CM | POA: Diagnosis not present

## 2019-12-31 DIAGNOSIS — M25512 Pain in left shoulder: Secondary | ICD-10-CM | POA: Diagnosis not present

## 2019-12-31 DIAGNOSIS — G8929 Other chronic pain: Secondary | ICD-10-CM | POA: Diagnosis not present

## 2019-12-31 DIAGNOSIS — R252 Cramp and spasm: Secondary | ICD-10-CM | POA: Diagnosis not present

## 2019-12-31 NOTE — Therapy (Signed)
[facility] [address], [address] [place], [place], [address] Phone: [phone]   Fax:  [phone]  Physical Therapy Treatment  Patient Details  Name: Amanda James MRN: [id number] Date of Birth: 1971/02/20 Referring Provider (PT): Rodolph Bong, MD   Encounter Date: 12/31/2019  PT End of Session - 12/31/19 1017    Visit Number  3    Date for PT Re-Evaluation  02/11/20    Authorization Type  [facility] Employee    PT Start Time  1018    PT Stop Time  1100    PT Time Calculation (min)  42 min    Activity Tolerance  Patient tolerated treatment well;No increased pain    Behavior During Therapy  WFL for tasks assessed/performed       Past Medical History:  Diagnosis Date  . Allergy   . Cervical cancer screening 05/02/2017  . Depression   . Depression with anxiety   . Diabetes mellitus type 2 in obese ([facility])   . Diabetes mellitus without complication ([facility])   . History of chicken pox   . Hyperlipidemia   . Low back pain   . Migraines   . Obesity 11/10/2016    Past Surgical History:  Procedure Laterality Date  . ABDOMINOPLASTY  2012  . AUGMENTATION MAMMAPLASTY     maxilift with implants   . BLADDER SUSPENSION  2016  . LAPAROSCOPIC GASTRIC BANDING  2005   esophageal dilation. band still present but released  . MASTOPEXY  2012    There were no vitals filed for this visit.  Subjective Assessment - 12/31/19 1018    Subjective  DN and ionto patch have been helpful.  Pain is a little better.  HEP is going well but it is hard, especially the prone exercises.    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  No/denies    Pain Onset  More than a month ago    Pain Frequency  Intermittent    Aggravating Factors    shoulder reaching/lifting    Pain Relieving Factors  ionto, ibuprofen                       [facility] Adult PT Treatment/Exercise - 12/31/19 0001      Iontophoresis   Type of Iontophoresis  Dexamethasone    Location  left biceps tendon proximal     Dose  4 mg/ml #2    Time  4-6 hour patch       Manual Therapy   Manual Therapy  Joint mobilization;Passive ROM;Soft tissue mobilization    Joint Mobilization  upper t-spine facet downglides, Lt rib springing, thoracic PAs upper thoracic Gr II/III    Soft tissue mobilization  Lt lat, teres minor and major, bil upper traps    Passive ROM  Lt shoulder flexion, abduction with STM       Trigger Point Dry Needling - 12/31/19 0001    Consent Given?  Yes    Education Handout Provided  Previously provided    Muscles Treated Head and Neck  Upper trapezius   bil   Muscles Treated Upper Quadrant  Infraspinatus;Deltoid    Dry Needling Comments  Rt ant/post approach, Lt post approach only    Other Dry  Needling  Lt posterior shoulder    Upper Trapezius Response  Twitch reponse elicited;Palpable increased muscle length    Infraspinatus Response  Twitch response elicited;Palpable increased muscle length    Deltoid Response  Twitch response elicited;Palpable increased muscle length   posterior            PT Short Term Goals - 12/31/19 1155      PT SHORT TERM GOAL #2   Title  Pt will be ind with initial HEP for ROM, stretching and postural strength.    Status  On-going        PT Long Term Goals - 12/17/19 1015      PT LONG TERM GOAL #1   Title  Pt will be ind in advanced HEP and understand how to safely progress    Time  8    Period  Weeks    Status  New    Target Date  02/11/20      PT LONG TERM GOAL #2   Title  Pt will achieve full, painfree motion symmetrical to Rt UE  in all planes for Lt shoulder to imrpove overhead reaching and dressing.    Time  8    Period  Weeks    Status  New    Target Date  02/11/20       PT LONG TERM GOAL #3   Title  Pt will achieve painfree Lt shoulder and elbow strength 5/5 to improve heavier household activity performance.    Time  8    Period  Weeks    Status  New    Target Date  02/11/20      PT LONG TERM GOAL #4   Title  Pt will reduce FOTO to </= 31% to demo less limitation    Time  8    Period  Weeks    Status  New    Target Date  02/11/20      PT LONG TERM GOAL #5   Title  Pt will report at least 70% improvement in Lt shoulder pain during both daily tasks and sleep.    Time  8    Period  Weeks    Status  New    Target Date  02/11/20            Plan - 12/31/19 1151    Clinical Impression Statement  Pt reports some initial relief with PT in Lt shoulder pain.  She continues to be stiff in end range flexion and has some end range pain with abduction, especially if weighted.  PT focused on DN, joint mobs for posture improvement and STM surrounding Lt upper quadrant today.  Most notable release with DN was bil upper traps.  Pt noted some relief with ionto #1 last visit so #2 applied to Lt long head of bicep today.  PT encouraged current HEP to be continued before progressing due to Pt reporting exercises are challenging her and lack of time due to manual therapy focus today.  Next visit review HEP and progress as able.    Rehab Potential  Excellent    PT Frequency  1x / week    PT Duration  8 weeks    PT Treatment/Interventions  ADLs/Self Care Home Management;Cryotherapy;Electrical Stimulation;Iontophoresis 4mg /ml Dexamethasone;Moist Heat;Traction;Functional mobility training;Therapeutic activities;Therapeutic exercise;Neuromuscular re-education;Patient/family education;Manual techniques;Passive range of motion;Dry needling;Taping;Joint Manipulations;Spinal Manipulations    PT Next Visit Plan  f/u on ionto #2 and place #3 if helping, ranger and/or pulleys for Lt shoulder, cervical stretching,  neck retraction, scapular strength (review HEP and progress/edit as  needed)    PT Home Exercise Plan  Access Code: [email or web address]    Consulted and Agree with Plan of Care  Patient       Patient will benefit from skilled therapeutic intervention in order to improve the following deficits and impairments:     Visit Diagnosis: Chronic left shoulder pain  Abnormal posture  Cramp and spasm     Problem List Patient Active Problem List   Diagnosis Date Noted  . Recurrent UTI 12/26/2019  . Shoulder pain, left 12/26/2019  . Chronic bilateral low back pain without sciatica 06/28/2018  . Cervical cancer screening 05/02/2017  . Chronic migraine w/o aura w/o status migrainosus, not intractable 04/06/2017  . Obesity 11/10/2016  . Vitamin D deficiency 11/10/2016  . Preventative health care 11/10/2016  . History of chicken pox   . Allergy   . Diabetes mellitus type 2 in obese ([place])   . Hyperlipidemia     Baruch Merl, PT 12/31/19 11:56 AM   [facility] [address], [place], [place], [address] Phone: [phone]   Fax:  [phone]  Name: Amanda James MRN: [id number] Date of Birth: [birth year]

## 2020-01-02 NOTE — Progress Notes (Signed)

## 2020-01-07 ENCOUNTER — Ambulatory Visit: Payer: 59 | Admitting: Physical Therapy

## 2020-01-14 ENCOUNTER — Ambulatory Visit: Payer: 59 | Attending: Family Medicine | Admitting: Physical Therapy

## 2020-01-14 ENCOUNTER — Other Ambulatory Visit: Payer: Self-pay

## 2020-01-14 ENCOUNTER — Encounter: Payer: Self-pay | Admitting: Physical Therapy

## 2020-01-14 DIAGNOSIS — M25512 Pain in left shoulder: Secondary | ICD-10-CM | POA: Diagnosis not present

## 2020-01-14 DIAGNOSIS — R293 Abnormal posture: Secondary | ICD-10-CM | POA: Insufficient documentation

## 2020-01-14 DIAGNOSIS — G8929 Other chronic pain: Secondary | ICD-10-CM | POA: Diagnosis not present

## 2020-01-14 DIAGNOSIS — R252 Cramp and spasm: Secondary | ICD-10-CM | POA: Insufficient documentation

## 2020-01-14 NOTE — Therapy (Signed)
[facility] [address], [address] [place], [place], [address] Phone: [phone]   Fax:  [phone]  Physical Therapy Treatment  Patient Details  Name: Amanda James MRN: [id number] Date of Birth: [birth year] Referring Provider (PT): Rodolph Bong, MD   Encounter Date: 01/14/2020  PT End of Session - 01/14/20 1012    Visit Number  4    Date for PT Re-Evaluation  02/11/20    Authorization Type  [employer]    PT Start Time  0930    PT Stop Time  1012    PT Time Calculation (min)  42 min    Activity Tolerance  Patient tolerated treatment well;No increased pain    Behavior During Therapy  WFL for tasks assessed/performed       Past Medical History:  Diagnosis Date  . Allergy   . Cervical cancer screening 05/02/2017  . Depression   . Depression with anxiety   . Diabetes mellitus type 2 in obese (HCC)   . Diabetes mellitus without complication (HCC)   . History of chicken pox   . Hyperlipidemia   . Low back pain   . Migraines   . Obesity 11/10/2016    Past Surgical History:  Procedure Laterality Date  . ABDOMINOPLASTY  2012  . AUGMENTATION MAMMAPLASTY     maxilift with implants   . BLADDER SUSPENSION  2016  . LAPAROSCOPIC GASTRIC BANDING  2005   esophageal dilation. band still present but released  . MASTOPEXY  2012    There were no vitals filed for this visit.  Subjective Assessment - 01/14/20 0936    Subjective  Pt reports 50% improvement in range and pain in Lt shoulder with PT.  Worst is reaching out to side.    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  Yes    Pain Score  2     Pain Location  Shoulder    Pain Orientation  Left;Anterior    Pain Descriptors / Indicators  Aching    Pain Type   Chronic pain    Pain Onset  More than a month ago    Pain Frequency  Intermittent    Aggravating Factors   reaching out to side    Pain Relieving Factors  ionto, ibuprofen                       [facility] Adult PT Treatment/Exercise - 01/14/20 0001      Shoulder Exercises: Standing   Horizontal ABduction  Strengthening;10 reps;Theraband    Theraband Level (Shoulder Horizontal ABduction)  Level 2 (Red)    External Rotation  Strengthening;Both;10 reps;Theraband    Theraband Level (Shoulder External Rotation)  Level 2 (Red)    Extension  Strengthening;Both;10 reps;Theraband    Theraband Level (Shoulder Extension)  Level 2 (Red)    Row  Strengthening;Both;10 reps;Theraband    Theraband Level (Shoulder Row)  Level 3 (Green)    Diagonals  Strengthening;Theraband;10 reps;Both    Theraband Level (Shoulder Diagonals)  Level 2 (Red)      Iontophoresis   Type of Iontophoresis  Dexamethasone    Location  left biceps tendon proximal     Dose  4 mg/ml #3    Time  4-6 hour  patch        Trigger Point Dry Needling - 01/14/20 0001    Consent Given?  Yes    Muscles Treated Head and Neck  Upper trapezius    Dry Needling Comments  Left    Upper Trapezius Response  Twitch reponse elicited;Palpable increased muscle length           PT Education - 01/14/20 1000    Education Details  Access Code: [email or web address]    Person(s) Educated  Patient    Methods  Explanation;Demonstration;Verbal cues;Handout;Tactile cues    Comprehension  Verbalized understanding;Returned demonstration       PT Short Term Goals - 12/31/19 1155      PT SHORT TERM GOAL #2   Title  Pt will be ind with initial HEP for ROM, stretching and postural strength.    Status  On-going        PT Long Term Goals - 01/14/20 1210      PT LONG TERM GOAL #1   Title  Pt will be ind in advanced HEP and understand how to safely progress    Status  On-going      PT LONG TERM GOAL #2   Title  Pt will achieve full,  painfree motion symmetrical to Rt UE  in all planes for Lt shoulder to imrpove overhead reaching and dressing.    Status  On-going      PT LONG TERM GOAL #3   Title  Pt will achieve painfree Lt shoulder and elbow strength 5/5 to improve heavier household activity performance.    Status  On-going      PT LONG TERM GOAL #4   Title  Pt will reduce FOTO to </= 31% to demo less limitation    Status  On-going      PT LONG TERM GOAL #5   Title  Pt will report at least 70% improvement in Lt shoulder pain during both daily tasks and sleep.    Baseline  50%    Status  On-going            Plan - 01/14/20 1207    Clinical Impression Statement  Session focused today on updated HEP.  PT revised prone scapular strength to standing red and green band routine for scapular stabilization and postural strength.  Pt had better awareness of muscle recruitment and more success standing vs prone and doesn't have a great place to do prone ther ex at home.  PT gave cueing for self-checks to keep neck long (not hike upper trap) and drop shoulder blade down back.  PT gave TCs at inferior angle of Lt scapula for improved awareness.  PT also performed DN to Lt upper trap which continues to be a compensatory muscle for Pt with signif twitch and release response.  Pt with ongoing Lt bicep tendon tenderness so ionto #3 placed today.  Pt will continue to benefit from skilled PT along POC for strength and postural re-ed.  Manual therapy as needed.    Rehab Potential  Excellent    PT Frequency  1x / week    PT Duration  8 weeks    PT Treatment/Interventions  ADLs/Self Care Home Management;Cryotherapy;Electrical Stimulation;Iontophoresis 4mg /ml Dexamethasone;Moist Heat;Traction;Functional mobility training;Therapeutic activities;Therapeutic exercise;Neuromuscular re-education;Patient/family education;Manual techniques;Passive range of motion;Dry needling;Taping;Joint Manipulations;Spinal Manipulations    PT Next Visit Plan   f/u on new updates to HEP, add Lt scapular PNF, supine squares, add Lt shoulder flexion with red band, sidelying Lt shoulder abd, manual PT as needed,  ionto #4 if approp    PT Home Exercise Plan  Access Code: [email or web address]    Consulted and Agree with Plan of Care  Patient       Patient will benefit from skilled therapeutic intervention in order to improve the following deficits and impairments:     Visit Diagnosis: Chronic left shoulder pain  Abnormal posture  Cramp and spasm     Problem List Patient Active Problem List   Diagnosis Date Noted  . Recurrent UTI 12/26/2019  . Shoulder pain, left 12/26/2019  . Chronic bilateral low back pain without sciatica 06/28/2018  . Cervical cancer screening 05/02/2017  . Chronic migraine w/o aura w/o status migrainosus, not intractable 04/06/2017  . Obesity 11/10/2016  . Vitamin D deficiency 11/10/2016  . Preventative health care 11/10/2016  . History of chicken pox   . Allergy   . Diabetes mellitus type 2 in obese (HCC)   . Hyperlipidemia     Morton Peters, PT 01/14/20 12:12 PM   [facility] [address] [place], [place], [address] Phone: [phone]   Fax:  [phone]  Name: Amanda James MRN: [id number] Date of Birth: 06/09/1971

## 2020-01-14 NOTE — Patient Instructions (Signed)
Access Code: [email or web address]  URL: [email or web address]  Date: 01/14/2020  Prepared by: Loistine Simas Teagen Bucio   Exercises  Standing Backward Shoulder Rolls - 10 reps - 3 sets - 1x daily - 7x weekly  Seated Scapular Retraction - 10 reps - 3 sets - 3 hold - 1x daily - 7x weekly  Seated Cervical Sidebending Stretch - 2 reps - 2 sets - 20 hold - 2x daily - 7x weekly  Supine Shoulder Flexion Overhead with Dowel - 3 reps - 1 sets - 20 hold - 1x daily - 7x weekly  Standing Shoulder Horizontal Abduction with Resistance - 10 reps - 2 sets - 1x daily - 7x weekly  Standing Shoulder Diagonal Horizontal Abduction 60/120 Degrees with Resistance - 10 reps - 2 sets - 1x daily - 7x weekly  Shoulder extension with resistance - Neutral - 10 reps - 2 sets - 1x daily - 7x weekly  Standing Shoulder External Rotation with Resistance - 10 reps - 2 sets - 1x daily - 7x weekly  Standing Low Shoulder Row with Anchored Resistance - 10 reps - 1 sets - 1x daily - 7x weekly

## 2020-01-21 ENCOUNTER — Ambulatory Visit: Payer: 59 | Admitting: Physical Therapy

## 2020-01-21 ENCOUNTER — Encounter: Payer: Self-pay | Admitting: Physical Therapy

## 2020-01-21 ENCOUNTER — Other Ambulatory Visit: Payer: Self-pay

## 2020-01-21 DIAGNOSIS — M25512 Pain in left shoulder: Secondary | ICD-10-CM

## 2020-01-21 DIAGNOSIS — R252 Cramp and spasm: Secondary | ICD-10-CM

## 2020-01-21 DIAGNOSIS — R293 Abnormal posture: Secondary | ICD-10-CM

## 2020-01-21 DIAGNOSIS — G8929 Other chronic pain: Secondary | ICD-10-CM

## 2020-01-21 NOTE — Therapy (Signed)
[facility] [address] [place], [place], [address] Phone: [phone]   Fax:  [phone]  Physical Therapy Treatment  Patient Details  Name: Amanda James MRN: [id number] Date of Birth: 21-Jun-1971 Referring Provider (PT): Rodolph Bong, MD   Encounter Date: 01/21/2020  PT End of Session - 01/21/20 1034    Visit Number  5    Date for PT Re-Evaluation  02/11/20    Authorization Type  [employer] [employer]    PT Start Time  0932    PT Stop Time  1022    PT Time Calculation (min)  50 min    Activity Tolerance  Patient tolerated treatment well;No increased pain    Behavior During Therapy  WFL for tasks assessed/performed       Past Medical History:  Diagnosis Date  . Allergy   . Cervical cancer screening 05/02/2017  . Depression   . Depression with anxiety   . Diabetes mellitus type 2 in obese (HCC)   . Diabetes mellitus without complication (HCC)   . History of chicken pox   . Hyperlipidemia   . Low back pain   . Migraines   . Obesity 11/10/2016    Past Surgical History:  Procedure Laterality Date  . ABDOMINOPLASTY  2012  . AUGMENTATION MAMMAPLASTY     maxilift with implants   . BLADDER SUSPENSION  2016  . LAPAROSCOPIC GASTRIC BANDING  2005   esophageal dilation. band still present but released  . MASTOPEXY  2012    There were no vitals filed for this visit.  Subjective Assessment - 01/21/20 0934    Subjective  The DN is really helping.  My HEP is going well but I do have a hard time not hiking the shoulder.    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  Yes    Pain Score  1     Pain Location  Shoulder    Pain Orientation  Left;Anterior    Pain Descriptors / Indicators  Aching;Sore    Pain  Type  Chronic pain    Pain Onset  More than a month ago    Pain Frequency  Intermittent    Aggravating Factors   reaching out to side but improving    Pain Relieving Factors  DN, HEP, ionto    Effect of Pain on Daily Activities  reaching to side for functional tasks                       [facility] Adult PT Treatment/Exercise - 01/21/20 0001      Exercises   Exercises  Shoulder;Neck      Neck Exercises: Seated   Other Seated Exercise  upper t-spine shoulder snags with towel bil 2x10 each, d/c'd due to Pt not feeling stretch after initial round      Shoulder Exercises: Standing   Horizontal ABduction  Strengthening;Both;Theraband    Theraband Level (Shoulder Horizontal ABduction)  Level 3 (Green)    Horizontal ABduction Limitations  palms up/down    Flexion  Strengthening;Left;Theraband;10 reps    Theraband Level (Shoulder Flexion)  Level 1 (Yellow)    Flexion Limitations  standing on end of band    Extension  Strengthening;Both;15 reps;Theraband  Theraband Level (Shoulder Extension)  Level 3 (Green)    Row  Strengthening;Both;15 reps;Theraband    Theraband Level (Shoulder Row)  Level 3 (Green)    Row Limitations  PT cued form    Diagonals  Strengthening;Both;Theraband;10 reps    Theraband Level (Shoulder Diagonals)  Level 2 (Red)    Other Standing Exercises  wall slides 1# ankle weight flexion and scaption reach      Shoulder Exercises: ROM/Strengthening   UBE (Upper Arm Bike)  L2 x 4' 2x2, PT present to review progress    Other ROM/Strengthening Exercises  Ys, Ws and serratus punch and scap squeeze on foam roller x 10 rounds each      Iontophoresis   Type of Iontophoresis  Dexamethasone    Location  left biceps tendon proximal     Dose  4 mg/ml #4    Time  4-6 hour patch        Trigger Point Dry Needling - 01/21/20 0001    Consent Given?  Yes    Education Handout Provided  Previously provided    Muscles Treated Head and Neck  Upper trapezius    Dry  Needling Comments  bil    Upper Trapezius Response  Twitch reponse elicited;Palpable increased muscle length             PT Short Term Goals - 01/21/20 1041      PT SHORT TERM GOAL #1   Title  Pt will report compliance with adjusted UE position when sleeping to avoid impingement    Status  Achieved      PT SHORT TERM GOAL #2   Title  Pt will be ind with initial HEP for ROM, stretching and postural strength.    Status  Achieved        PT Long Term Goals - 01/21/20 1041      PT LONG TERM GOAL #1   Title  Pt will be ind in advanced HEP and understand how to safely progress    Status  On-going      PT LONG TERM GOAL #2   Title  Pt will achieve full, painfree motion symmetrical to Rt UE  in all planes for Lt shoulder to imrpove overhead reaching and dressing.    Status  On-going      PT LONG TERM GOAL #3   Title  Pt will achieve painfree Lt shoulder and elbow strength 5/5 to improve heavier household activity performance.    Status  On-going      PT LONG TERM GOAL #4   Title  Pt will reduce FOTO to </= 31% to demo less limitation    Status  On-going      PT LONG TERM GOAL #5   Title  Pt will report at least 70% improvement in Lt shoulder pain during both daily tasks and sleep.    Status  Achieved            Plan - 01/21/20 1035    Clinical Impression Statement  Pt arrives with low grade pain, rating 1/10.  Sleep is no longer disturbed by Lt shoulder.  Pt no longer needs Ibuprofen for shoulder pain.  HEP is going well and Pt is compliant.  PT session focused on review of HEP with progression of ther ex.  Pt has increased resistance with most shoulder ther ex from red to green band.  PT added Lt shoulder flexion full range with scap control using yellow band today as well as 1#  weighted wall slides for flexion and scaption to simulate functional overhead reaching.  PT introduced foam roller Ys, Ws and scapular ROM (serratus punch and retraction) for upper t-spine  extension self-mobs with overhead UE motions and awareness of retraction/protraction control.  PT provided intermittent TC and VCs to avoid overuse of upper traps and to facil upward rotation and posterior tipping of Lt scapula during overhead ther ex.  PT performed DN to bil upper traps as Pt continues to carry tension and use upper traps in presence of shoulder fatigue during ther ex and funcitonal tasks.  Ionto #4 placed for bicep tendon tenderness which is diminishing.  Continue along POC with progression of functional strength as tol and manual as needed.    Rehab Potential  Excellent    PT Frequency  1x / week    PT Duration  8 weeks    PT Treatment/Interventions  ADLs/Self Care Home Management;Cryotherapy;Electrical Stimulation;Iontophoresis 4mg /ml Dexamethasone;Moist Heat;Traction;Functional mobility training;Therapeutic activities;Therapeutic exercise;Neuromuscular re-education;Patient/family education;Manual techniques;Passive range of motion;Dry needling;Taping;Joint Manipulations;Spinal Manipulations    PT Next Visit Plan  do FOTO, use soft foam roller Ys, Ws and serratus punch/retract to see which pt prefers (soft/firm), yellow shoulder flexion, wall slides weighted (2#?), ball on wall, counter push ups, DN/ionto    PT Home Exercise Plan  Access Code: [email or web address]    Consulted and Agree with Plan of Care  Patient       Patient will benefit from skilled therapeutic intervention in order to improve the following deficits and impairments:     Visit Diagnosis: Chronic left shoulder pain  Abnormal posture  Cramp and spasm     Problem List Patient Active Problem List   Diagnosis Date Noted  . Recurrent UTI 12/26/2019  . Shoulder pain, left 12/26/2019  . Chronic bilateral low back pain without sciatica 06/28/2018  . Cervical cancer screening 05/02/2017  . Chronic migraine w/o aura w/o status migrainosus, not intractable 04/06/2017  . Obesity 11/10/2016  . Vitamin D deficiency  11/10/2016  . Preventative health care 11/10/2016  . History of chicken pox   . Allergy   . Diabetes mellitus type 2 in obese ([place])   . Hyperlipidemia     Baruch Merl, PT 01/21/20 10:44 AM   [facility] [address] [place], [place], [address] Phone: [phone]   Fax:  [phone]  Name: LUVERN MISCHKE MRN: [id number] Date of Birth: 11/10/1971

## 2020-01-28 ENCOUNTER — Encounter: Payer: Self-pay | Admitting: Physical Therapy

## 2020-01-28 ENCOUNTER — Encounter: Payer: Self-pay | Admitting: Family Medicine

## 2020-01-28 ENCOUNTER — Other Ambulatory Visit: Payer: Self-pay

## 2020-01-28 ENCOUNTER — Ambulatory Visit: Payer: 59 | Admitting: Physical Therapy

## 2020-01-28 DIAGNOSIS — R252 Cramp and spasm: Secondary | ICD-10-CM | POA: Diagnosis not present

## 2020-01-28 DIAGNOSIS — M25512 Pain in left shoulder: Secondary | ICD-10-CM | POA: Diagnosis not present

## 2020-01-28 DIAGNOSIS — R293 Abnormal posture: Secondary | ICD-10-CM | POA: Diagnosis not present

## 2020-01-28 DIAGNOSIS — G8929 Other chronic pain: Secondary | ICD-10-CM | POA: Diagnosis not present

## 2020-01-28 NOTE — Therapy (Signed)
[facility] [address] [place], [place], [address] Phone: [phone]   Fax:  [phone]  Physical Therapy Treatment  Patient Details  Name: Amanda James MRN: [id number] Date of Birth: Jun 13, 1971 Referring Provider (PT): Gregor Hams, MD   Encounter Date: 01/28/2020  PT End of Session - 01/28/20 0931    Visit Number  6    Date for PT Re-Evaluation  02/11/20    Authorization Type  [employer] [employer]    PT Start Time  [phone]    PT Stop Time  1010    PT Time Calculation (min)  39 min    Activity Tolerance  Patient tolerated treatment well;No increased pain    Behavior During Therapy  WFL for tasks assessed/performed       Past Medical History:  Diagnosis Date  . Allergy   . Cervical cancer screening 05/02/2017  . Depression   . Depression with anxiety   . Diabetes mellitus type 2 in obese ([place])   . Diabetes mellitus without complication ([place])   . History of chicken pox   . Hyperlipidemia   . Low back pain   . Migraines   . Obesity 11/10/2016    Past Surgical History:  Procedure Laterality Date  . ABDOMINOPLASTY  2012  . AUGMENTATION MAMMAPLASTY     maxilift with implants   . BLADDER SUSPENSION  2016  . LAPAROSCOPIC GASTRIC BANDING  2005   esophageal dilation. band still present but released  . MASTOPEXY  2012    There were no vitals filed for this visit.  Subjective Assessment - 01/28/20 0932    Subjective  No pain since last here.  Pt states she is ready to be d/c'd for Lt shoulder.  Hasn't been as good about HEP last week but sleep and pain are much improved.    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  No/denies         [facility] PT Assessment - 01/28/20 0001      Observation/Other Assessments   Focus on  Therapeutic Outcomes (FOTO)   13%                   [facility] Adult PT Treatment/Exercise - 01/28/20 0001      Neck Exercises: Machines for Strengthening   UBE (Upper Arm Bike)  L3 3x3, PT present to do FOTO and goal review      Shoulder Exercises: Standing   External Rotation  Left;Theraband;15 reps    External Rotation Limitations  yellow in scaption, red in neutral, 15 each    Flexion  Strengthening;Left;15 reps    Theraband Level (Shoulder Flexion)  Level 1 (Yellow)    Flexion Limitations  standing on end of band    ABduction  Strengthening;Both;10 reps;Weights    Shoulder ABduction Weight (lbs)  2    Extension  Strengthening;Both;Weights;15 reps    Extension Weight (lbs)  25    Extension Limitations  pulleys standing    Row  Strengthening;Theraband;20 reps;Both    Theraband Level (Shoulder Row)  Level 4 (Blue)    Diagonals Limitations  15#-20# D1/D2 x 15 each cybex pulleys    Other Standing Exercises  lateral fly elbows bent upright standing 3# x 10 reps      Shoulder Exercises: ROM/Strengthening  Other ROM/Strengthening Exercises  Ys, Ws and serratus punch and scap squeeze on foam roller x 10 rounds each               PT Short Term Goals - 01/21/20 1041      PT SHORT TERM GOAL #1   Title  Pt will report compliance with adjusted UE position when sleeping to avoid impingement    Status  Achieved      PT SHORT TERM GOAL #2   Title  Pt will be ind with initial HEP for ROM, stretching and postural strength.    Status  Achieved        PT Long Term Goals - 01/28/20 4920      PT LONG TERM GOAL #1   Title  Pt will be ind in advanced HEP and understand how to safely progress    Status  Achieved      PT LONG TERM GOAL #2   Title  Pt will achieve full, painfree motion symmetrical to Rt UE  in all planes for Lt shoulder to imrpove overhead reaching and dressing.    Status  Achieved      PT LONG TERM GOAL #3   Title  Pt will achieve painfree Lt shoulder  and elbow strength 5/5 to improve heavier household activity performance.    Status  Achieved      PT LONG TERM GOAL #4   Title  Pt will reduce FOTO to </= 31% to demo less limitation    Baseline  13%    Status  Achieved      PT LONG TERM GOAL #5   Title  Pt will report at least 70% improvement in Lt shoulder pain during both daily tasks and sleep.    Status  Achieved            Plan - 01/28/20 1010    Clinical Impression Statement  Pt has met all goals and is ready for d/c.  She has full painfree Llt shoulder ROM with 4+-5/5 strength throughout.  FOTO score decreased to 13% from 41% limitation.  Pt is sleeping through the night without pain.  PT reinforced need for ongoing compliance with HEP.    Rehab Potential  Excellent    PT Frequency  1x / week    PT Duration  8 weeks    PT Treatment/Interventions  ADLs/Self Care Home Management;Cryotherapy;Electrical Stimulation;Iontophoresis 69m/ml Dexamethasone;Moist Heat;Traction;Functional mobility training;Therapeutic activities;Therapeutic exercise;Neuromuscular re-education;Patient/family education;Manual techniques;Passive range of motion;Dry needling;Taping;Joint Manipulations;Spinal Manipulations    PT Next Visit Plan  d/c to HEP    PT Home Exercise Plan  Access Code: [email or web address]   Consulted and Agree with Plan of Care  Patient       Patient will benefit from skilled therapeutic intervention in order to improve the following deficits and impairments:     Visit Diagnosis: Chronic left shoulder pain  Abnormal posture  Cramp and spasm     Problem List Patient Active Problem List   Diagnosis Date Noted  . Recurrent UTI 12/26/2019  . Shoulder pain, left 12/26/2019  . Chronic bilateral low back pain without sciatica 06/28/2018  . Cervical cancer screening 05/02/2017  . Chronic migraine w/o aura w/o status migrainosus, not intractable 04/06/2017  . Obesity 11/10/2016  . Vitamin D deficiency 11/10/2016  . Preventative  health care 11/10/2016  . History of chicken pox   . Allergy   . Diabetes mellitus type 2 in obese ([facility]   . Hyperlipidemia  PHYSICAL THERAPY DISCHARGE SUMMARY  Visits from Start of Care: 6  Current functional level related to goals / functional outcomes: See above   Remaining deficits: See above   Education / Equipment: HEP Plan: Patient agrees to discharge.  Patient goals were met. Patient is being discharged due to meeting the stated rehab goals.  ?????         Baruch Merl, PT 01/28/20 10:14 AM   [facility] [address], [place], [place], [address] Phone: [phone]   Fax:  [phone]  Name: Amanda James MRN: [id number] Date of Birth: [birth year]

## 2020-01-31 ENCOUNTER — Other Ambulatory Visit: Payer: Self-pay | Admitting: Family Medicine

## 2020-01-31 DIAGNOSIS — M545 Low back pain, unspecified: Secondary | ICD-10-CM

## 2020-02-04 ENCOUNTER — Encounter: Payer: Self-pay | Admitting: Physical Therapy

## 2020-02-04 ENCOUNTER — Other Ambulatory Visit: Payer: Self-pay

## 2020-02-04 ENCOUNTER — Ambulatory Visit: Payer: 59 | Attending: Family Medicine | Admitting: Physical Therapy

## 2020-02-04 DIAGNOSIS — G8929 Other chronic pain: Secondary | ICD-10-CM | POA: Insufficient documentation

## 2020-02-04 DIAGNOSIS — M545 Low back pain, unspecified: Secondary | ICD-10-CM

## 2020-02-04 DIAGNOSIS — R293 Abnormal posture: Secondary | ICD-10-CM | POA: Diagnosis not present

## 2020-02-04 DIAGNOSIS — M6281 Muscle weakness (generalized): Secondary | ICD-10-CM | POA: Diagnosis not present

## 2020-02-04 NOTE — Therapy (Signed)
[facility] [address], [address] [place], [place], [address] Phone: [phone]   Fax:  [phone]  Physical Therapy Evaluation  Patient Details  Name: Amanda James MRN: [id number] Date of Birth: 16-Dec-1970 Referring Provider (PT): Bradd Canary, MD   Encounter Date: 02/04/2020  PT End of Session - 02/04/20 1022    Visit Number  1    Date for PT Re-Evaluation  03/31/20    Authorization Type  [employer] [employer]    PT Start Time  0930    PT Stop Time  1010    PT Time Calculation (min)  40 min    Activity Tolerance  Patient tolerated treatment well;No increased pain    Behavior During Therapy  WFL for tasks assessed/performed       Past Medical History:  Diagnosis Date  . Allergy   . Cervical cancer screening 05/02/2017  . Depression   . Depression with anxiety   . Diabetes mellitus type 2 in obese (HCC)   . Diabetes mellitus without complication (HCC)   . History of chicken pox   . Hyperlipidemia   . Low back pain   . Migraines   . Obesity 11/10/2016    Past Surgical History:  Procedure Laterality Date  . ABDOMINOPLASTY  2012  . AUGMENTATION MAMMAPLASTY     maxilift with implants   . BLADDER SUSPENSION  2016  . LAPAROSCOPIC GASTRIC BANDING  2005   esophageal dilation. band still present but released  . MASTOPEXY  2012    There were no vitals filed for this visit.   Subjective Assessment - 02/04/20 0935    Subjective  Pt referred to PT for chronic LBP x 30 years with last 10 years intermittent anterior thigh "fuzzy numbness".  Pain is worse with static standing, better with walking and sitting (immediate relief).  Ibuprofen and flexiril when pain is bad - needs meds 2-3/days/week.    Limitations  Standing    How long can you stand comfortably?  5 min    Currently in Pain?  Yes    Pain Score  3    can get up to 8/10   Pain Location  Back    Pain Orientation  Right;Left;Lower    Pain Descriptors /  Indicators  Penetrating;Aching;Tightness    Pain Type  Chronic pain    Pain Radiating Towards  very rarely anterior thighs bil    Pain Onset  More than a month ago    Pain Frequency  Intermittent    Aggravating Factors   prolonged standing > 5 min    Pain Relieving Factors  meds, sitting    Effect of Pain on Daily Activities  standing in lines, work when flared up         [facility] PT Assessment - 02/04/20 0001      Assessment   Medical Diagnosis  M54.5 (ICD-10-CM) - Low back pain, unspecified back pain laterality, unspecified chronicity, unspecified whether sciatica present    Referring Provider (PT)  Bradd Canary, MD    Onset Date/Surgical Date  --   30 years ago   Hand Dominance  Right    Next MD Visit  as needed    Prior Therapy  for shoulder at this facility      Precautions   Precautions  None      Restrictions   Weight Bearing Restrictions  No      Balance Screen   Has the patient fallen in the  past 6 months  No      Home Public house manager residence    Home Layout  Two level    Alternate Level Stairs-Number of Steps  13      Prior Function   Level of Independence  Independent    Vocation  Full time employment    Gaffer  MD for [employer]    Leisure  garden, crochet      Cognition   Overall Cognitive Status  Within Functional Limits for tasks assessed      Observation/Other Assessments   Focus on Therapeutic Outcomes (FOTO)   56%   43%     Sensation   Light Touch  Appears Intact      Functional Tests   Functional tests  Squat;Single leg stance      Squat   Comments  pelvic rotation around Rt hip      Single Leg Stance   Comments  Poor balance on Lt LE, Fair on Rt LE      Posture/Postural Control   Posture/Postural Control  Postural limitations    Postural Limitations  Increased lumbar lordosis      ROM / Strength   AROM / PROM / Strength  Strength;AROM      AROM   Overall AROM  Comments  bil hip rotation WNL with some excessive flexibility, trunk ROM WFL with lack of lordotic reversal in flexion      Strength   Overall Strength Comments  poor recruitment of TrA, braces entire abdominal wall    Strength Assessment Site  Hip;Knee    Right/Left Hip  Left;Right    Right Hip Flexion  4-/5    Right Hip Extension  4/5    Right Hip External Rotation   4/5    Right Hip Internal Rotation  4+/5    Right Hip ABduction  3+/5    Right Hip ADduction  4+/5    Left Hip Flexion  3+/5    Left Hip Extension  4/5    Left Hip External Rotation  4-/5    Left Hip Internal Rotation  4+/5    Left Hip ABduction  3+/5    Left Hip ADduction  4+/5    Right/Left Knee  Right;Left    Right Knee Flexion  5/5    Right Knee Extension  5/5    Left Knee Flexion  5/5    Left Knee Extension  5/5      Flexibility   Soft Tissue Assessment /Muscle Length  yes    Quadriceps  limited 10%      Palpation   Spinal mobility  + pain with PA pressure L5, L4, limited rib springing and thoracic PAs throughout, poor flexion L3-S1    SI assessment   increased play on Lt, unlocks on Lt in SLS and with squat, appears to contribute to poor balance in SLS on Lt    Palpation comment  tender thoracic paraspinals, bil ITB, spinous process L3-L5      Special Tests   Other special tests  signif myofascial restrictions lumbar spine, poor skin rolling lumbar region      Ambulation/Gait   Gait Pattern  Within Functional Limits                Objective measurements completed on examination: See above findings.              PT Education - 02/04/20 1013  Education Details  Access Code: [email or web address]    Person(s) Educated  Patient    Methods  Explanation;Demonstration;Verbal cues;Handout    Comprehension  Verbalized understanding;Returned demonstration       PT Short Term Goals - 02/04/20 1214      PT SHORT TERM GOAL #1   Title  Pt will be ind in initial HEP    Time  4    Period  Weeks     Status  New    Target Date  03/03/20      PT SHORT TERM GOAL #2   Title  Pt will be able to demo diaphragmatic abdominal breathing pattern and deep core muscle recruitment.    Time  4    Period  Weeks    Status  New    Target Date  03/03/20      PT SHORT TERM GOAL #3   Title  Pt will be able to demo proper standing posture and body mechanics with sit to stand and squats.    Time  4    Period  Weeks    Status  New    Target Date  03/03/20        PT Long Term Goals - 02/04/20 1216      PT LONG TERM GOAL #1   Title  Pt will be ind in advanced HEP and understand how to safely progress    Time  8    Period  Weeks    Status  New    Target Date  03/31/20      PT LONG TERM GOAL #2   Title  Pt will be able to demo Lt LE balance for 10+ sec with SI joint stability and glut medius activation.    Time  8    Period  Weeks    Status  New    Target Date  03/31/20      PT LONG TERM GOAL #3   Title  Pt will achieve at least 4+/5 LE strength in bil hips to improve functional tasks such as gardening and household tasks.    Time  8    Period  Weeks    Status  New    Target Date  03/31/20      PT LONG TERM GOAL #4   Title  Reduce FOTO to </= 43%    Baseline  56%    Time  8    Period  Weeks    Status  New    Target Date  03/31/20      PT LONG TERM GOAL #5   Title  Pt will be able to stand for at least 20 min with pain not to exceed 3/10 to improve pain with errands.    Time  8    Period  Weeks    Status  New    Target Date  03/31/20             Plan - 02/04/20 1158    Clinical Impression Statement  Pt referred to PT for chronic LBP.  She rarely experiences anterior thigh numbness bilaterally that is mild in nature.  Back pain is worse in standing > 5' and immediately better in sitting.  She has signif lumbar lordosis which she reports she had since childhood.  Trunk and hip ROM is WNL with some excessive range available in bil hips and increased play in Lt SI joint.   She has weakness in bil hips, most notably hip flexors and  abductors, and Lt SI joint unlocks in squat and SLS on Lt.  She has poor balance in Lt SLS and fair on Rt SLS.  Spinal mobility is limited throughout thoracic spine and lower lumbar spine.  She has increased tone in bil thoracic paraspinals.  Myofascial restrictions are present in lumbar region bil with poor midline skin rolling.  She reported some relief with brief myofascial release of lumbar fascia today.  PT initiated standing posture awareness and TrA contractions today.  Pt will benefit from skilled PT to address deficits and allow for reduced pain during all desired tasks.    Personal Factors and Comorbidities  Time since onset of injury/illness/exacerbation    Examination-Activity Limitations  Stand    Examination-Participation Restrictions  Community Activity;Shop;Yard Work;Cleaning    Stability/Clinical Decision Making  Stable/Uncomplicated    Clinical Decision Making  Low    Rehab Potential  Excellent    PT Frequency  1x / week    PT Duration  8 weeks    PT Treatment/Interventions  ADLs/Self Care Home Management;Cryotherapy;Electrical Stimulation;Moist Heat;Traction;Functional mobility training;Therapeutic activities;Therapeutic exercise;Balance training;Neuromuscular re-education;Manual techniques;Patient/family education;Passive range of motion;Dry needling;Taping;Spinal Manipulations;Joint Manipulations    PT Next Visit Plan  myofascial release lumbar, STM thoracic paraspinals, thoracic joint mobs, hip strength (hip flexor isom, abd, ER) and core    PT Home Exercise Plan  Access Code: [email or web address]    Consulted and Agree with Plan of Care  Patient       Patient will benefit from skilled therapeutic intervention in order to improve the following deficits and impairments:  Decreased coordination, Increased fascial restricitons, Increased muscle spasms, Decreased activity tolerance, Pain, Postural dysfunction, Decreased strength,  Decreased balance, Hypomobility, Improper body mechanics  Visit Diagnosis: Chronic midline low back pain without sciatica - Plan: PT plan of care cert/re-cert  Abnormal posture - Plan: PT plan of care cert/re-cert  Muscle weakness (generalized) - Plan: PT plan of care cert/re-cert     Problem List Patient Active Problem List   Diagnosis Date Noted  . Recurrent UTI 12/26/2019  . Shoulder pain, left 12/26/2019  . Chronic bilateral low back pain without sciatica 06/28/2018  . Cervical cancer screening 05/02/2017  . Chronic migraine w/o aura w/o status migrainosus, not intractable 04/06/2017  . Obesity 11/10/2016  . Vitamin D deficiency 11/10/2016  . Preventative health care 11/10/2016  . History of chicken pox   . Allergy   . Diabetes mellitus type 2 in obese (HCC)   . Hyperlipidemia     Morton Peters, PT 02/04/20 12:21 PM   [facility] [address] [place], [place], [address] Phone: [phone]   Fax:  [phone]  Name: DEVYNN HESSLER MRN: [id number] Date of Birth: 27-Jan-1971

## 2020-02-04 NOTE — Patient Instructions (Signed)
Access Code: [email or web address]  URL: [email or web address]  Date: 02/04/2020  Prepared by: Loistine Simas Gennett Garcia   Exercises  Sidelying Transversus Abdominis Bracing - 10 reps - 10 hold - 2x daily - 7x weekly  Standing Quadriceps Stretch - 2 reps - 1 sets - 30 hold - 2x daily - 7x weekly

## 2020-02-11 ENCOUNTER — Other Ambulatory Visit: Payer: Self-pay

## 2020-02-11 ENCOUNTER — Encounter: Payer: Self-pay | Admitting: Physical Therapy

## 2020-02-11 ENCOUNTER — Ambulatory Visit: Payer: 59 | Attending: Family Medicine | Admitting: Physical Therapy

## 2020-02-11 DIAGNOSIS — R293 Abnormal posture: Secondary | ICD-10-CM | POA: Diagnosis not present

## 2020-02-11 DIAGNOSIS — G8929 Other chronic pain: Secondary | ICD-10-CM | POA: Diagnosis not present

## 2020-02-11 DIAGNOSIS — M6281 Muscle weakness (generalized): Secondary | ICD-10-CM | POA: Diagnosis not present

## 2020-02-11 DIAGNOSIS — M545 Low back pain: Secondary | ICD-10-CM | POA: Insufficient documentation

## 2020-02-11 NOTE — Patient Instructions (Signed)
Access Code: [email or web address]  URL: [email or web address]  Date: 02/11/2020  Prepared by: Loistine Simas Shekinah Pitones   Exercises  Sidelying Transversus Abdominis Bracing - 10 reps - 10 hold - 2x daily - 7x weekly  Standing Quadriceps Stretch - 2 reps - 1 sets - 30 hold - 2x daily - 7x weekly  Standing Gastroc Stretch at Counter - 2 reps - 1 sets - 30 hold - 1x daily - 7x weekly  Standing Soleus Stretch - 2 reps - 1 sets - 30 hold - 1x daily - 7x weekly  Wall Squat - 3 reps - 1 sets - 15 hold - 1x daily - 7x weekly  Supine Posterior Pelvic Tilt - 15 reps - 1 sets - 3 hold - 1x daily - 7x weekly  Hooklying Isometric Hip Flexion - 5 reps - 1 sets - 5 hold - 1x daily - 7x weekly  Supine March with Resistance Band - 20 reps - 1 sets - 1x daily - 7x weekly  Hooklying Clamshell with Resistance - 15 reps - 1 sets - 1x daily - 7x weekly  Supine Bridge with Spinal Articulation - 10 reps - 1 sets - 1x daily - 7x weekly  Supine Double Knee to Chest - 5 reps - 1 sets - 10 hold - 1x daily - 7x weekly

## 2020-02-11 NOTE — Therapy (Signed)
[facility] [address] [place], [place], [address] Phone: [phone]   Fax:  [phone]  Physical Therapy Treatment  Patient Details  Name: Amanda James MRN: [id number] Date of Birth: May 23, 1971 Referring Provider (PT): Bradd Canary, MD   Encounter Date: 02/11/2020  PT End of Session - 02/11/20 0934    Visit Number  2    Date for PT Re-Evaluation  03/31/20    Authorization Type  [facility] [employer]    PT Start Time  0930    PT Stop Time  1015    PT Time Calculation (min)  45 min    Activity Tolerance  Patient tolerated treatment well;No increased pain    Behavior During Therapy  WFL for tasks assessed/performed       Past Medical History:  Diagnosis Date  . Allergy   . Cervical cancer screening 05/02/2017  . Depression   . Depression with anxiety   . Diabetes mellitus type 2 in obese (HCC)   . Diabetes mellitus without complication (HCC)   . History of chicken pox   . Hyperlipidemia   . Low back pain   . Migraines   . Obesity 11/10/2016    Past Surgical History:  Procedure Laterality Date  . ABDOMINOPLASTY  2012  . AUGMENTATION MAMMAPLASTY     maxilift with implants   . BLADDER SUSPENSION  2016  . LAPAROSCOPIC GASTRIC BANDING  2005   esophageal dilation. band still present but released  . MASTOPEXY  2012    There were no vitals filed for this visit.  Subjective Assessment - 02/11/20 0933    Subjective  Working on core contractions.  I can hold for about 5 seconds.    Limitations  Standing    How long can you stand comfortably?  5 min    Diagnostic tests  --    Patient Stated Goals  --    Currently in Pain?  Yes    Pain Score  5     Pain Location  Back    Pain Orientation  Right;Left;Lower    Pain Descriptors / Indicators  Aching;Tightness;Penetrating    Pain Type  Chronic pain    Pain Onset  More than a month ago    Pain Frequency  Intermittent    Aggravating Factors   standing > 5 min     Pain Relieving Factors  sitting, meds    Effect of Pain on Daily Activities  standing in lines                       [facility] Adult PT Treatment/Exercise - 02/11/20 0001      Exercises   Exercises  Lumbar;Knee/Hip      Lumbar Exercises: Stretches   Other Lumbar Stretch Exercise  fascial stretching hanging from bil UEs into prayer and prayer itwh child's pose      Lumbar Exercises: Aerobic   Recumbent Bike  L3 x 2.5 min, L2 x 2.5 min      Lumbar Exercises: Supine   Pelvic Tilt  10 reps    Dead Bug Limitations  march with green band around knees in pelvic tilt with TrA    Isometric Hip Flexion  5 reps;5 seconds    Isometric Hip Flexion Limitations  bil    Other Supine Lumbar Exercises  green band clam x 15    Other Supine Lumbar Exercises  articulating bridge x 10 reps with green band for hip  abd isom      Knee/Hip Exercises: Stretches   Radio broadcast assistant  Both;1 rep;20 seconds    Gastroc Stretch Limitations  wall stretch    Soleus Stretch  Both;1 rep;20 seconds    Soleus Stretch Limitations  wall stretch      Knee/Hip Exercises: Standing   Wall Squat  3 sets    Wall Squat Limitations  15 sec      Manual Therapy   Joint Mobilization  Gr III/IV thoracic PAs and rib springing    Soft tissue mobilization  myofasical release thoracic paraspinals bil             PT Education - 02/11/20 1004    Education Details  Access Code: [email or web address]    Person(s) Educated  Patient    Methods  Explanation;Demonstration;Handout;Verbal cues    Comprehension  Verbalized understanding;Returned demonstration       PT Short Term Goals - 02/04/20 1214      PT SHORT TERM GOAL #1   Title  Pt will be ind in initial HEP    Time  4    Period  Weeks    Status  New    Target Date  03/03/20      PT SHORT TERM GOAL #2   Title  Pt will be able to demo diaphragmatic abdominal breathing pattern and deep core muscle recruitment.    Time  4    Period   Weeks    Status  New    Target Date  03/03/20      PT SHORT TERM GOAL #3   Title  Pt will be able to demo proper standing posture and body mechanics with sit to stand and squats.    Time  4    Period  Weeks    Status  New    Target Date  03/03/20        PT Long Term Goals - 02/04/20 1216      PT LONG TERM GOAL #1   Title  Pt will be ind in advanced HEP and understand how to safely progress    Time  8    Period  Weeks    Status  New    Target Date  03/31/20      PT LONG TERM GOAL #2   Title  Pt will be able to demo Lt LE balance for 10+ sec with SI joint stability and glut medius activation.    Time  8    Period  Weeks    Status  New    Target Date  03/31/20      PT LONG TERM GOAL #3   Title  Pt will achieve at least 4+/5 LE strength in bil hips to improve functional tasks such as gardening and household tasks.    Time  8    Period  Weeks    Status  New    Target Date  03/31/20      PT LONG TERM GOAL #4   Title  Reduce FOTO to </= 43%    Baseline  56%    Time  8    Period  Weeks    Status  New    Target Date  03/31/20      PT LONG TERM GOAL #5   Title  Pt will be able to stand for at least 20 min with pain not to exceed 3/10 to improve pain with errands.  Time  8    Period  Weeks    Status  New    Target Date  03/31/20            Plan - 02/11/20 1202    Clinical Impression Statement  Pt returns for first follow up since evaluation.  She is working on TrA and able to hold approx 5 sec before fatigue.  PT introduced and built HEP for LE strength and core stabilization in supine and standing (wall squat), fascial lumbar stretching hanging from countertop in child's pose/with and without SB, and added gastroc/soleus stretching.  Remaining time spent on thoracic myofasical release which will be primary focus of next session if HEP going well.  Pt will continue to benefit from skilled PT to address chronic LBP.    PT Frequency  1x / week    PT Duration  8  weeks    PT Treatment/Interventions  ADLs/Self Care Home Management;Cryotherapy;Electrical Stimulation;Moist Heat;Traction;Functional mobility training;Therapeutic activities;Therapeutic exercise;Balance training;Neuromuscular re-education;Manual techniques;Patient/family education;Passive range of motion;Dry needling;Taping;Spinal Manipulations;Joint Manipulations    PT Next Visit Plan  f/u on HEP, myofascial release lumbar and thoracic, thoracic mobs, thoracic ROM/stretching    PT Home Exercise Plan  Access Code: [email or web address]    Consulted and Agree with Plan of Care  Patient       Patient will benefit from skilled therapeutic intervention in order to improve the following deficits and impairments:     Visit Diagnosis: Chronic midline low back pain without sciatica  Abnormal posture  Muscle weakness (generalized)     Problem List Patient Active Problem List   Diagnosis Date Noted  . Recurrent UTI 12/26/2019  . Shoulder pain, left 12/26/2019  . Chronic bilateral low back pain without sciatica 06/28/2018  . Cervical cancer screening 05/02/2017  . Chronic migraine w/o aura w/o status migrainosus, not intractable 04/06/2017  . Obesity 11/10/2016  . Vitamin D deficiency 11/10/2016  . Preventative health care 11/10/2016  . History of chicken pox   . Allergy   . Diabetes mellitus type 2 in obese (HCC)   . Hyperlipidemia     Morton Peters, PT 02/11/20 12:06 PM   [facility] [address], [address] [place], [place], [address] Phone: [phone]   Fax:  [phone]  Name: Amanda James MRN: [id number] Date of Birth: 17-Aug-1971

## 2020-02-21 ENCOUNTER — Other Ambulatory Visit: Payer: Self-pay | Admitting: Family Medicine

## 2020-02-21 ENCOUNTER — Other Ambulatory Visit: Payer: Self-pay | Admitting: *Deleted

## 2020-02-21 MED ORDER — ATORVASTATIN CALCIUM 10 MG PO TABS: 10.0000 mg | ORAL_TABLET | Freq: Every day | ORAL | 1 refills | Status: DC

## 2020-02-21 MED FILL — ESCITALOPRAM 20 MG TABLET: 20 | 90 days supply | Qty: 90 | Fill #0

## 2020-02-21 MED FILL — ATORVASTATIN 10 MG TABLET: 10 | 90 days supply | Qty: 90 | Fill #0

## 2020-02-22 ENCOUNTER — Other Ambulatory Visit: Payer: Self-pay | Admitting: *Deleted

## 2020-02-22 MED ORDER — ESCITALOPRAM OXALATE 20 MG PO TABS: 20.0000 mg | ORAL_TABLET | Freq: Every day | ORAL | 1 refills | Status: DC

## 2020-02-25 ENCOUNTER — Other Ambulatory Visit: Payer: Self-pay

## 2020-02-25 ENCOUNTER — Encounter: Payer: Self-pay | Admitting: Physical Therapy

## 2020-02-25 ENCOUNTER — Ambulatory Visit: Payer: 59 | Admitting: Physical Therapy

## 2020-02-25 DIAGNOSIS — M545 Low back pain, unspecified: Secondary | ICD-10-CM

## 2020-02-25 DIAGNOSIS — R293 Abnormal posture: Secondary | ICD-10-CM

## 2020-02-25 DIAGNOSIS — G8929 Other chronic pain: Secondary | ICD-10-CM

## 2020-02-25 DIAGNOSIS — M6281 Muscle weakness (generalized): Secondary | ICD-10-CM | POA: Diagnosis not present

## 2020-02-25 NOTE — Therapy (Signed)
[facility] [address], [address] [place], [place], [address] Phone: [phone]   Fax:  [phone]  Physical Therapy Treatment  Patient Details  Name: Amanda James MRN: [id number] Date of Birth: 04-01-1971 Referring Provider (PT): Bradd Canary, MD   Encounter Date: 02/25/2020  PT End of Session - 02/25/20 0847    Visit Number  3    Date for PT Re-Evaluation  03/31/20    Authorization Type  [employer]    PT Start Time  0845    PT Stop Time  0930    PT Time Calculation (min)  45 min    Activity Tolerance  Patient tolerated treatment well;No increased pain    Behavior During Therapy  WFL for tasks assessed/performed       Past Medical History:  Diagnosis Date  . Allergy   . Cervical cancer screening 05/02/2017  . Depression   . Depression with anxiety   . Diabetes mellitus type 2 in obese (HCC)   . Diabetes mellitus without complication (HCC)   . History of chicken pox   . Hyperlipidemia   . Low back pain   . Migraines   . Obesity 11/10/2016    Past Surgical History:  Procedure Laterality Date  . ABDOMINOPLASTY  2012  . AUGMENTATION MAMMAPLASTY     maxilift with implants   . BLADDER SUSPENSION  2016  . LAPAROSCOPIC GASTRIC BANDING  2005   esophageal dilation. band still present but released  . MASTOPEXY  2012    There were no vitals filed for this visit.  Subjective Assessment - 02/25/20 0845    Subjective  I'm doing the HEP and it's all very doable.  The wall squat is very hard.    Limitations  Standing    How long can you stand comfortably?  5 min    Currently in Pain?  Yes    Pain Score  3     Pain Orientation  Right;Left;Lower    Pain Descriptors / Indicators  Aching;Tightness;Penetrating    Pain Type  Chronic pain    Pain Onset  More than a month ago    Pain Frequency  Intermittent    Aggravating Factors   stand > 5 min    Pain Relieving Factors  sitting, meds    Effect of Pain on Daily  Activities  standing in lines                       [facility] Adult PT Treatment/Exercise - 02/25/20 0001      Self-Care   Self-Care  Posture    Posture  sitting and standing avoidance of buttock and upper abdominal gripping, thoracic paraspinal overuse, self-checks for ability to "wiggle ribcage"   HEP for postural self-checks/awareness throughout day     Lumbar Exercises: Aerobic   Recumbent Bike  L2 x 5' with lumbar heat, PT present to discuss HEP and symtoms      Lumbar Exercises: Seated   Other Seated Lumbar Exercises  PT guided thoracic sideglides with rotation, seated thoracic rot 2x10 sec with PT cueing to perform lateral expansion breathing throughout    Other Seated Lumbar Exercises  articulating bridge with pause for T/L jxn release, with and without foam roller (soft)      Lumbar Exercises: Supine   Other Supine Lumbar Exercises  diaphragmatic breathing supine legs elevated (HEP)      Manual Therapy   Joint Mobilization  Gr III/IV thoracic  PAs and rib springing    Soft tissue mobilization  skin rolling central lumbar, myofascial release thoracodorsal fascia, STM stripping thoracolumbar parasprinals               PT Short Term Goals - 02/04/20 1214      PT SHORT TERM GOAL #1   Title  Pt will be ind in initial HEP    Time  4    Period  Weeks    Status  New    Target Date  03/03/20      PT SHORT TERM GOAL #2   Title  Pt will be able to demo diaphragmatic abdominal breathing pattern and deep core muscle recruitment.    Time  4    Period  Weeks    Status  New    Target Date  03/03/20      PT SHORT TERM GOAL #3   Title  Pt will be able to demo proper standing posture and body mechanics with sit to stand and squats.    Time  4    Period  Weeks    Status  New    Target Date  03/03/20        PT Long Term Goals - 02/04/20 1216      PT LONG TERM GOAL #1   Title  Pt will be ind in advanced HEP and understand how to safely progress    Time  8     Period  Weeks    Status  New    Target Date  03/31/20      PT LONG TERM GOAL #2   Title  Pt will be able to demo Lt LE balance for 10+ sec with SI joint stability and glut medius activation.    Time  8    Period  Weeks    Status  New    Target Date  03/31/20      PT LONG TERM GOAL #3   Title  Pt will achieve at least 4+/5 LE strength in bil hips to improve functional tasks such as gardening and household tasks.    Time  8    Period  Weeks    Status  New    Target Date  03/31/20      PT LONG TERM GOAL #4   Title  Reduce FOTO to </= 43%    Baseline  56%    Time  8    Period  Weeks    Status  New    Target Date  03/31/20      PT LONG TERM GOAL #5   Title  Pt will be able to stand for at least 20 min with pain not to exceed 3/10 to improve pain with errands.    Time  8    Period  Weeks    Status  New    Target Date  03/31/20            Plan - 02/25/20 1016    Clinical Impression Statement  Session focused on postural re-ed, release of thoracic/buttock/upper abdominal gripping strategies with Pt education on self-checks for posture, and manual techniques for improved thoracic and lumbar soft tissue mobility.  Pt will continue to benefit from downtraining faulty stabilization strategies which are creating limited mobility in mid and upper back with supplement and retraining of deep core stab for lumbopelvic-hip region.  She reported improved pain in standing end of session today.  Continue along POC.    Personal  Factors and Comorbidities  Time since onset of injury/illness/exacerbation    Rehab Potential  Excellent    PT Frequency  1x / week    PT Duration  8 weeks    PT Treatment/Interventions  ADLs/Self Care Home Management;Cryotherapy;Electrical Stimulation;Moist Heat;Traction;Functional mobility training;Therapeutic activities;Therapeutic exercise;Balance training;Neuromuscular re-education;Manual techniques;Patient/family education;Passive range of motion;Dry  needling;Taping;Spinal Manipulations;Joint Manipulations    PT Next Visit Plan  f/u on postural awareness, diaphragmatic breathing, re-check TrA contraction, put posture into sit to stand/weight shifts/SLS, thoracic rotation and UE diagonals, manual techniques    PT Home Exercise Plan  Access Code: [email or web address]    Consulted and Agree with Plan of Care  Patient       Patient will benefit from skilled therapeutic intervention in order to improve the following deficits and impairments:     Visit Diagnosis: Chronic midline low back pain without sciatica  Abnormal posture  Muscle weakness (generalized)     Problem List Patient Active Problem List   Diagnosis Date Noted  . Recurrent UTI 12/26/2019  . Shoulder pain, left 12/26/2019  . Chronic bilateral low back pain without sciatica 06/28/2018  . Cervical cancer screening 05/02/2017  . Chronic migraine w/o aura w/o status migrainosus, not intractable 04/06/2017  . Obesity 11/10/2016  . Vitamin D deficiency 11/10/2016  . Preventative health care 11/10/2016  . History of chicken pox   . Allergy   . Diabetes mellitus type 2 in obese ([place])   . Hyperlipidemia     Baruch Merl, PT 02/25/20 10:22 AM   [facility] [address], [place], [place], [address] Phone: [phone]   Fax:  [phone]  Name: Amanda James MRN: [id number] Date of Birth: 11-14-1971

## 2020-03-03 ENCOUNTER — Ambulatory Visit: Payer: 59 | Admitting: Physical Therapy

## 2020-03-03 ENCOUNTER — Other Ambulatory Visit: Payer: Self-pay

## 2020-03-03 ENCOUNTER — Encounter: Payer: Self-pay | Admitting: Physical Therapy

## 2020-03-03 DIAGNOSIS — G8929 Other chronic pain: Secondary | ICD-10-CM | POA: Diagnosis not present

## 2020-03-03 DIAGNOSIS — R293 Abnormal posture: Secondary | ICD-10-CM | POA: Diagnosis not present

## 2020-03-03 DIAGNOSIS — M6281 Muscle weakness (generalized): Secondary | ICD-10-CM

## 2020-03-03 DIAGNOSIS — M545 Low back pain: Secondary | ICD-10-CM | POA: Diagnosis not present

## 2020-03-03 NOTE — Therapy (Signed)
[facility] [address], [address] [place], [place], [address] Phone: [phone]   Fax:  [phone]  Physical Therapy Treatment  Patient Details  Name: Amanda James MRN: [id number] Date of Birth: August 19, 1971 Referring Provider (PT): Bradd Canary, MD   Encounter Date: 03/03/2020  PT End of Session - 03/03/20 0927    Visit Number  4    Date for PT Re-Evaluation  03/31/20    Authorization Type  [employer] [employer]    PT Start Time  [phone]    PT Stop Time  1015    PT Time Calculation (min)  48 min    Activity Tolerance  Patient tolerated treatment well;No increased pain    Behavior During Therapy  WFL for tasks assessed/performed       Past Medical History:  Diagnosis Date  . Allergy   . Cervical cancer screening 05/02/2017  . Depression   . Depression with anxiety   . Diabetes mellitus type 2 in obese (HCC)   . Diabetes mellitus without complication (HCC)   . History of chicken pox   . Hyperlipidemia   . Low back pain   . Migraines   . Obesity 11/10/2016    Past Surgical History:  Procedure Laterality Date  . ABDOMINOPLASTY  2012  . AUGMENTATION MAMMAPLASTY     maxilift with implants   . BLADDER SUSPENSION  2016  . LAPAROSCOPIC GASTRIC BANDING  2005   esophageal dilation. band still present but released  . MASTOPEXY  2012    There were no vitals filed for this visit.  Subjective Assessment - 03/03/20 0927    Subjective  I've had less pain this past week than I've had all year.    Limitations  Standing    How long can you stand comfortably?  5 min    Currently in Pain?  Yes    Pain Score  2     Pain Location  Back    Pain Orientation  Right;Left;Lower    Pain Descriptors / Indicators  Aching;Tightness    Pain Type  Chronic pain    Pain Onset  More than a month ago    Pain Frequency  Intermittent    Aggravating Factors   stand > 5'    Pain Relieving Factors  sitting, meds, stretching    Effect of Pain on  Daily Activities  stand in lines                       [facility] Adult PT Treatment/Exercise - 03/03/20 0001      Exercises   Exercises  Lumbar;Knee/Hip      Lumbar Exercises: Aerobic   Recumbent Bike  L2 x 6' with lumbar heat, PT present to discuss symptoms and progression plan for today's visit      Lumbar Exercises: Seated   Hip Flexion on Ball  Both;5 reps    Hip Flexion on Ball Limitations  signif ribcage shift    Other Seated Lumbar Exercises  seated on green ball pelvic tilts, tail wags and circles x 10 each    Other Seated Lumbar Exercises  1# shoulder flexion on ball bil x 10      Lumbar Exercises: Sidelying   Clam  Left;5 reps    Clam Limitations  PT explained this as progression to sidelying TrA    Other Sidelying Lumbar Exercises  TrA with stacked pelvis holds to pre-set for clam      Knee/Hip  Exercises: Seated   Long Arc Quad  Strengthening;Both;20 reps    Long Arc Quad Limitations  seated on dynadisc for core English as a second language teacher  10 reps;Both    Marching Limitations  seated on ball (d/c'd) and on dynadisc (more successful with mirror)      Manual Therapy   Manual Therapy  Myofascial release;Soft tissue mobilization    Soft tissue mobilization  skin rolling central lumbar    Myofascial Release  upper lumbar and lower thoracic bil             PT Education - 03/03/20 1201    Education Details  Access Code: [email or web address]    Person(s) Educated  Patient    Methods  Explanation;Demonstration;Handout;Verbal cues    Comprehension  Verbalized understanding;Returned demonstration       PT Short Term Goals - 03/03/20 1205      PT SHORT TERM GOAL #1   Title  Pt will be ind in initial HEP    Status  Achieved      PT SHORT TERM GOAL #2   Title  Pt will be able to demo diaphragmatic abdominal breathing pattern and deep core muscle recruitment.    Status  On-going      PT SHORT TERM GOAL #3   Title  Pt will be able to demo proper standing posture and  body mechanics with sit to stand and squats.    Status  New        PT Long Term Goals - 02/04/20 1216      PT LONG TERM GOAL #1   Title  Pt will be ind in advanced HEP and understand how to safely progress    Time  8    Period  Weeks    Status  New    Target Date  03/31/20      PT LONG TERM GOAL #2   Title  Pt will be able to demo Lt LE balance for 10+ sec with SI joint stability and glut medius activation.    Time  8    Period  Weeks    Status  New    Target Date  03/31/20      PT LONG TERM GOAL #3   Title  Pt will achieve at least 4+/5 LE strength in bil hips to improve functional tasks such as gardening and household tasks.    Time  8    Period  Weeks    Status  New    Target Date  03/31/20      PT LONG TERM GOAL #4   Title  Reduce FOTO to </= 43%    Baseline  56%    Time  8    Period  Weeks    Status  New    Target Date  03/31/20      PT LONG TERM GOAL #5   Title  Pt will be able to stand for at least 20 min with pain not to exceed 3/10 to improve pain with errands.    Time  8    Period  Weeks    Status  New    Target Date  03/31/20            Plan - 03/03/20 1201    Clinical Impression Statement  Pt arrived reporting less pain this week than she has had all year.  She has not "tested" the pain with prolonged standing yet.  PT challenged core strategy today with seated tasks  on ball and on dynadisc.  She was challenged with seated march on compliant surface demo'ing initial lateral ribcage shift but this improved with use of mirror and tactile cueing.  PT introduced SL TrA with progression of clam overlay but this was challenging so will just do SL TrA intitially.  PT performed myofascial release to upper lumbar and lower t-spine today. PT noted improving skin rolling compliance in lumbar region.  Pt will continue to benefit from skilled PT to address deficits related to chronic LBP.    PT Frequency  1x / week    PT Duration  8 weeks    PT  Treatment/Interventions  ADLs/Self Care Home Management;Cryotherapy;Electrical Stimulation;Moist Heat;Traction;Functional mobility training;Therapeutic activities;Therapeutic exercise;Balance training;Neuromuscular re-education;Manual techniques;Patient/family education;Passive range of motion;Dry needling;Taping;Spinal Manipulations;Joint Manipulations    PT Next Visit Plan  continue SL TrA, seated on compliant surface challenges, myofascial release lumbar and thoracic, intro sit to stand with dowel, supine dead bug, heel slide, trial TrA in quadruped    PT Home Exercise Plan  Access Code: [email or web address]    Consulted and Agree with Plan of Care  Patient       Patient will benefit from skilled therapeutic intervention in order to improve the following deficits and impairments:     Visit Diagnosis: Chronic midline low back pain without sciatica  Abnormal posture  Muscle weakness (generalized)     Problem List Patient Active Problem List   Diagnosis Date Noted  . Recurrent UTI 12/26/2019  . Shoulder pain, left 12/26/2019  . Chronic bilateral low back pain without sciatica 06/28/2018  . Cervical cancer screening 05/02/2017  . Chronic migraine w/o aura w/o status migrainosus, not intractable 04/06/2017  . Obesity 11/10/2016  . Vitamin D deficiency 11/10/2016  . Preventative health care 11/10/2016  . History of chicken pox   . Allergy   . Diabetes mellitus type 2 in obese ([place])   . Hyperlipidemia     Baruch Merl, PT 03/03/20 12:10 PM   [facility] [address] [place], [place], [address] Phone: [phone]   Fax:  [phone]  Name: Amanda James MRN: [id number] Date of Birth: 05-10-1971

## 2020-03-03 NOTE — Patient Instructions (Addendum)
Access Code: [email or web address] URL: [email or web address]: 03/26/2021Prepared by: Loistine Simas BeuhringExercises  Sidelying Transversus Abdominis Bracing - 2 x daily - 7 x weekly - 10 reps - 10 hold  Standing Quadriceps Stretch - 2 x daily - 7 x weekly - 2 reps - 1 sets - 30 hold  Standing Gastroc Stretch at Counter - 1 x daily - 7 x weekly - 2 reps - 1 sets - 30 hold  Standing Soleus Stretch - 1 x daily - 7 x weekly - 2 reps - 1 sets - 30 hold  Wall Squat - 1 x daily - 7 x weekly - 3 reps - 1 sets - 15 hold  Supine Posterior Pelvic Tilt - 1 x daily - 7 x weekly - 15 reps - 1 sets - 3 hold  Hooklying Isometric Hip Flexion - 1 x daily - 7 x weekly - 5 reps - 1 sets - 5 hold  Supine March with Resistance Band - 1 x daily - 7 x weekly - 20 reps - 1 sets  Hooklying Clamshell with Resistance - 1 x daily - 7 x weekly - 15 reps - 1 sets  Supine Bridge with Spinal Articulation - 1 x daily - 7 x weekly - 10 reps - 1 sets  Supine Double Knee to Chest - 1 x daily - 7 x weekly - 5 reps - 1 sets - 10 hold  Seated March - 1 x daily - 7 x weekly - 10 reps - 3 sets  Seated Swiss Ball Pelvic Circles - 1 x daily - 7 x weekly - 10 reps - 3 sets  Seated Swiss Ball Lateral Pelvic Tilts - 1 x daily - 7 x weekly - 10 reps - 3 sets  Sidelying Transversus Abdominis Bracing - 1 x daily - 7 x weekly - 10 reps - 3 sets  Clamshell - 1 x daily - 7 x weekly - 10 reps - 3 sets

## 2020-03-17 ENCOUNTER — Encounter: Payer: Self-pay | Admitting: Physical Therapy

## 2020-03-17 ENCOUNTER — Ambulatory Visit: Payer: 59 | Attending: Family Medicine | Admitting: Physical Therapy

## 2020-03-17 ENCOUNTER — Other Ambulatory Visit: Payer: Self-pay

## 2020-03-17 DIAGNOSIS — M6281 Muscle weakness (generalized): Secondary | ICD-10-CM | POA: Insufficient documentation

## 2020-03-17 DIAGNOSIS — R293 Abnormal posture: Secondary | ICD-10-CM | POA: Insufficient documentation

## 2020-03-17 DIAGNOSIS — M545 Low back pain: Secondary | ICD-10-CM | POA: Diagnosis not present

## 2020-03-17 DIAGNOSIS — R252 Cramp and spasm: Secondary | ICD-10-CM | POA: Insufficient documentation

## 2020-03-17 DIAGNOSIS — G8929 Other chronic pain: Secondary | ICD-10-CM | POA: Insufficient documentation

## 2020-03-17 NOTE — Therapy (Signed)
[facility] [address] [place], [place], [address] Phone: [phone]   Fax:  [phone]  Physical Therapy Treatment  Patient Details  Name: Amanda James MRN: [id number] Date of Birth: 02/25/1971 Referring Provider (PT): Mosie Lukes, MD   Encounter Date: 03/17/2020  PT End of Session - 03/17/20 1022    Visit Number  5    Date for PT Re-Evaluation  03/31/20    Authorization Type  [employer] [employer]    PT Start Time  9983    PT Stop Time  3825    PT Time Calculation (min)  40 min    Activity Tolerance  Patient tolerated treatment well;No increased pain    Behavior During Therapy  WFL for tasks assessed/performed       Past Medical History:  Diagnosis Date  . Allergy   . Cervical cancer screening 05/02/2017  . Depression   . Depression with anxiety   . Diabetes mellitus type 2 in obese ([place])   . Diabetes mellitus without complication ([place])   . History of chicken pox   . Hyperlipidemia   . Low back pain   . Migraines   . Obesity 11/10/2016    Past Surgical History:  Procedure Laterality Date  . ABDOMINOPLASTY  2012  . AUGMENTATION MAMMAPLASTY     maxilift with implants   . BLADDER SUSPENSION  2016  . LAPAROSCOPIC GASTRIC BANDING  2005   esophageal dilation. band still present but released  . MASTOPEXY  2012    There were no vitals filed for this visit.  Subjective Assessment - 03/17/20 0936    Subjective  I've been doing more walking by way of errands and I feel increased deep low back ache after 15 min.  I didn't have an appt last week and I felt the return of more pain having not been here.  I do some aspect of my HEP every day.    Limitations  Standing    How long can you stand comfortably?  5 min    How long can you walk comfortably?  15 min    Currently in Pain?  Yes    Pain Score  4     Pain Location  Back    Pain Orientation  Right;Left    Pain Descriptors / Indicators  Aching;Tightness     Pain Type  Chronic pain    Pain Onset  More than a month ago    Pain Frequency  Intermittent    Aggravating Factors   stand > 5'    Pain Relieving Factors  sitting, meds, stretching    Effect of Pain on Daily Activities  stand in lines, run errands                       [facility] Adult PT Treatment/Exercise - 03/17/20 0001      Neuro Re-ed    Neuro Re-ed Details   postural re-ed in standing with self-palpation of ribcage and pelvis, VC for guy wire elevating lower thoracic and knitting anterior ribcage, activation of TrA      Exercises   Exercises  Lumbar;Knee/Hip      Lumbar Exercises: Stretches   Other Lumbar Stretch Exercise  trunk flexion hang, add SB bil x 10 sec each, 2 rounds      Lumbar Exercises: Aerobic   Recumbent Bike  L2 x 6' with lumbar heat, PT present to discuss symptoms and progression plan for today's  visit      Knee/Hip Exercises: Stretches   Lobbyist  Both;2 reps;20 seconds    Hip Flexor Stretch  Both;1 rep;20 seconds      Manual Therapy   Myofascial Release  lower thoracic, obliques bil with breath cueing for added bucket handle motion and stretching, posterior to anterior and inferior to superior release surrounding bil posterior and lateral ribcage               PT Short Term Goals - 03/03/20 1205      PT SHORT TERM GOAL #1   Title  Pt will be ind in initial HEP    Status  Achieved      PT SHORT TERM GOAL #2   Title  Pt will be able to demo diaphragmatic abdominal breathing pattern and deep core muscle recruitment.    Status  On-going      PT SHORT TERM GOAL #3   Title  Pt will be able to demo proper standing posture and body mechanics with sit to stand and squats.    Status  New        PT Long Term Goals - 02/04/20 1216      PT LONG TERM GOAL #1   Title  Pt will be ind in advanced HEP and understand how to safely progress    Time  8    Period  Weeks    Status  New    Target Date  03/31/20      PT LONG TERM  GOAL #2   Title  Pt will be able to demo Lt LE balance for 10+ sec with SI joint stability and glut medius activation.    Time  8    Period  Weeks    Status  New    Target Date  03/31/20      PT LONG TERM GOAL #3   Title  Pt will achieve at least 4+/5 LE strength in bil hips to improve functional tasks such as gardening and household tasks.    Time  8    Period  Weeks    Status  New    Target Date  03/31/20      PT LONG TERM GOAL #4   Title  Reduce FOTO to </= 43%    Baseline  56%    Time  8    Period  Weeks    Status  New    Target Date  03/31/20      PT LONG TERM GOAL #5   Title  Pt will be able to stand for at least 20 min with pain not to exceed 3/10 to improve pain with errands.    Time  8    Period  Weeks    Status  New    Target Date  03/31/20            Plan - 03/17/20 1023    Clinical Impression Statement  Pt with improving tissue response to deep myofascial work.  She has challenges achieving improved posture in standing due to myofascial restrictions and poor TrA awareness. PT encouraged Pt to focus on quad/hip flexor stretching and lumbar "hang" stretch in standing, adding SB to flexion bil.  Pt had increased return of pain after going 2 weeks without PT session.  She notes pain after 5 min static standing and 15 min of walking for errands.  PT feels pain will reduce over time with improved fascial mobility, LE flexibility and postural awareness  and strength.  Pt will continue to benefit from skilled PT along current POC.    PT Frequency  1x / week    PT Duration  8 weeks    PT Treatment/Interventions  ADLs/Self Care Home Management;Cryotherapy;Electrical Stimulation;Moist Heat;Traction;Functional mobility training;Therapeutic activities;Therapeutic exercise;Balance training;Neuromuscular re-education;Manual techniques;Patient/family education;Passive range of motion;Dry needling;Taping;Spinal Manipulations;Joint Manipulations    PT Next Visit Plan  continue  myofascial work, breath work in SL/prone over ball, f/u on quad/hip flexor stretch and lumbar hang stretch in flex/flex SB focus since last visit, trial TrA in quadruped and SL, dead bug, seated on dynadisc, sit to stand    PT Home Exercise Plan  Access Code: [email or web address]    Recommended Other Services  re-cert by 1/95    Consulted and Agree with Plan of Care  Patient       Patient will benefit from skilled therapeutic intervention in order to improve the following deficits and impairments:     Visit Diagnosis: Chronic midline low back pain without sciatica  Abnormal posture  Muscle weakness (generalized)     Problem List Patient Active Problem List   Diagnosis Date Noted  . Recurrent UTI 12/26/2019  . Shoulder pain, left 12/26/2019  . Chronic bilateral low back pain without sciatica 06/28/2018  . Cervical cancer screening 05/02/2017  . Chronic migraine w/o aura w/o status migrainosus, not intractable 04/06/2017  . Obesity 11/10/2016  . Vitamin D deficiency 11/10/2016  . Preventative health care 11/10/2016  . History of chicken pox   . Allergy   . Diabetes mellitus type 2 in obese ([facility])   . Hyperlipidemia     Morton Peters, PT 03/17/20 10:41 AM   [facility] [address], [address] [place], [place], [address] Phone: [phone]   Fax:  [phone]  Name: Amanda James MRN: [id number] Date of Birth: 02/18/1971

## 2020-03-24 ENCOUNTER — Other Ambulatory Visit: Payer: Self-pay

## 2020-03-24 ENCOUNTER — Encounter: Payer: Self-pay | Admitting: Physical Therapy

## 2020-03-24 ENCOUNTER — Ambulatory Visit: Payer: 59 | Admitting: Physical Therapy

## 2020-03-24 DIAGNOSIS — G8929 Other chronic pain: Secondary | ICD-10-CM

## 2020-03-24 DIAGNOSIS — M6281 Muscle weakness (generalized): Secondary | ICD-10-CM

## 2020-03-24 DIAGNOSIS — M545 Low back pain: Secondary | ICD-10-CM | POA: Diagnosis not present

## 2020-03-24 DIAGNOSIS — R293 Abnormal posture: Secondary | ICD-10-CM | POA: Diagnosis not present

## 2020-03-24 DIAGNOSIS — R252 Cramp and spasm: Secondary | ICD-10-CM | POA: Diagnosis not present

## 2020-03-24 NOTE — Patient Instructions (Signed)
Access Code: [email or web address] URL: [email or web address]: 04/16/2021Prepared by: Loistine Simas BeuhringExercises  Sidelying Transversus Abdominis Bracing - 2 x daily - 7 x weekly - 10 reps - 10 hold  Standing Quadriceps Stretch - 2 x daily - 7 x weekly - 2 reps - 1 sets - 30 hold  Standing Gastroc Stretch at Counter - 1 x daily - 7 x weekly - 2 reps - 1 sets - 30 hold  Standing Soleus Stretch - 1 x daily - 7 x weekly - 2 reps - 1 sets - 30 hold  Wall Squat - 1 x daily - 7 x weekly - 3 reps - 1 sets - 15 hold  Supine Posterior Pelvic Tilt - 1 x daily - 7 x weekly - 15 reps - 1 sets - 3 hold  Hooklying Isometric Hip Flexion - 1 x daily - 7 x weekly - 5 reps - 1 sets - 5 hold  Supine March with Resistance Band - 1 x daily - 7 x weekly - 20 reps - 1 sets  Hooklying Clamshell with Resistance - 1 x daily - 7 x weekly - 15 reps - 1 sets  Supine Bridge with Spinal Articulation - 1 x daily - 7 x weekly - 10 reps - 1 sets  Supine Double Knee to Chest - 1 x daily - 7 x weekly - 5 reps - 1 sets - 10 hold  Seated March - 1 x daily - 7 x weekly - 10 reps - 3 sets  Seated Swiss Ball Pelvic Circles - 1 x daily - 7 x weekly - 10 reps - 3 sets  Seated Swiss Ball Lateral Pelvic Tilts - 1 x daily - 7 x weekly - 10 reps - 3 sets  Sidelying Transversus Abdominis Bracing - 1 x daily - 7 x weekly - 10 reps - 3 sets  Clamshell - 1 x daily - 7 x weekly - 10 reps - 3 sets  Clamshell with Resistance - 1 x daily - 7 x weekly - 3 sets - 5 reps  Quadruped Transversus Abdominis Bracing - 1 x daily - 7 x weekly - 3 sets - 5 reps - 5 hold

## 2020-03-24 NOTE — Therapy (Signed)
[facility] [address], [place], [place], [address] Phone: [phone]   Fax:  [phone]  Physical Therapy Treatment  Patient Details  Name: Amanda James MRN: [id number] Date of Birth: September 09, 1971 Referring Provider (PT): Mosie Lukes, MD   Encounter Date: 03/24/2020  PT End of Session - 03/24/20 1159    Visit Number  6    Date for PT Re-Evaluation  03/31/20    Authorization Type  [employer] [employer]    PT Start Time  0930    PT Stop Time  4081    PT Time Calculation (min)  45 min    Activity Tolerance  Patient tolerated treatment well;No increased pain    Behavior During Therapy  WFL for tasks assessed/performed       Past Medical History:  Diagnosis Date  . Allergy   . Cervical cancer screening 05/02/2017  . Depression   . Depression with anxiety   . Diabetes mellitus type 2 in obese ([place])   . Diabetes mellitus without complication ([place])   . History of chicken pox   . Hyperlipidemia   . Low back pain   . Migraines   . Obesity 11/10/2016    Past Surgical History:  Procedure Laterality Date  . ABDOMINOPLASTY  2012  . AUGMENTATION MAMMAPLASTY     maxilift with implants   . BLADDER SUSPENSION  2016  . LAPAROSCOPIC GASTRIC BANDING  2005   esophageal dilation. band still present but released  . MASTOPEXY  2012    There were no vitals filed for this visit.  Subjective Assessment - 03/24/20 0934    Subjective  I've had this week off and have done a ton of gardening.  I sat on ground and was bent forward x 2 hours and when I went to get up I was in trouble.  I was flared up x 24 hours.  Impoving over last few days but not back to baseline pain.    Limitations  Standing    How long can you stand comfortably?  5 min    How long can you walk comfortably?  15 min    Currently in Pain?  Yes    Pain Score  5     Pain Location  Back    Pain Orientation  Right;Left    Pain Descriptors / Indicators   Aching;Tightness    Pain Type  Chronic pain;Acute pain    Pain Onset  More than a month ago    Pain Frequency  Intermittent    Aggravating Factors   stand >15', prolonged bending for gardening    Pain Relieving Factors  heat, sitting, stretching    Effect of Pain on Daily Activities  stand in lines, run errands, gardening                       [facility] Adult PT Treatment/Exercise - 03/24/20 0001      Lumbar Exercises: Supine   Other Supine Lumbar Exercises  pelvic tilts in 90/90 hips/knees on floor feet on wall with deep breaths to stretch thoracodorsal fascia      Lumbar Exercises: Sidelying   Clam  Both;5 reps;5 seconds    Clam Limitations  PT gave manual resistance for clam and hip ext with TrA cueing via stacked pelvis cue      Lumbar Exercises: Quadruped   Other Quadruped Lumbar Exercises  TrA with knitted ribcage 5x5 sec      Knee/Hip  Exercises: Stretches   Lobbyist  Both;1 rep;20 seconds    Hip Flexor Stretch  Both;1 rep;20 seconds      Knee/Hip Exercises: Aerobic   Stationary Bike  L3 x 6' with lumbar heat      Manual Therapy   Manual Therapy  Myofascial release    Myofascial Release  skin rolling and thoracodorsal fascia bil                PT Short Term Goals - 03/03/20 1205      PT SHORT TERM GOAL #1   Title  Pt will be ind in initial HEP    Status  Achieved      PT SHORT TERM GOAL #2   Title  Pt will be able to demo diaphragmatic abdominal breathing pattern and deep core muscle recruitment.    Status  On-going      PT SHORT TERM GOAL #3   Title  Pt will be able to demo proper standing posture and body mechanics with sit to stand and squats.    Status  New        PT Long Term Goals - 02/04/20 1216      PT LONG TERM GOAL #1   Title  Pt will be ind in advanced HEP and understand how to safely progress    Time  8    Period  Weeks    Status  New    Target Date  03/31/20      PT LONG TERM GOAL #2   Title  Pt will be able to  demo Lt LE balance for 10+ sec with SI joint stability and glut medius activation.    Time  8    Period  Weeks    Status  New    Target Date  03/31/20      PT LONG TERM GOAL #3   Title  Pt will achieve at least 4+/5 LE strength in bil hips to improve functional tasks such as gardening and household tasks.    Time  8    Period  Weeks    Status  New    Target Date  03/31/20      PT LONG TERM GOAL #4   Title  Reduce FOTO to </= 43%    Baseline  56%    Time  8    Period  Weeks    Status  New    Target Date  03/31/20      PT LONG TERM GOAL #5   Title  Pt will be able to stand for at least 20 min with pain not to exceed 3/10 to improve pain with errands.    Time  8    Period  Weeks    Status  New    Target Date  03/31/20            Plan - 03/24/20 1159    Clinical Impression Statement  Pt with flare up last week after sitting bent over in garden x 2 hours straight.  Acute pain x 24 hours and recovering.  Current pain 5/10.  PT spent most of session on neuro re-ed for deep core with ramping through hip ext/abd/ER manual resistance for better deep multifidus cueing.  Thoracodorasal fascia mobility is improving but slowly.  Increased muscle tension in lower thoracic paraspinals today.  Re-eval next week with anticipation of extension of PT due to Pt making good progress with room for further improvement in strength, stability, posture  and mobility of soft tissues.    Examination-Activity Limitations  Stand    Rehab Potential  Excellent    PT Frequency  1x / week    PT Duration  8 weeks    PT Treatment/Interventions  ADLs/Self Care Home Management;Cryotherapy;Electrical Stimulation;Moist Heat;Traction;Functional mobility training;Therapeutic activities;Therapeutic exercise;Balance training;Neuromuscular re-education;Manual techniques;Patient/family education;Passive range of motion;Dry needling;Taping;Spinal Manipulations;Joint Manipulations    PT Next Visit Plan  RE-EVAL next visit,  STM and myofascial release followed by TrA in quad/SL and standing    PT Home Exercise Plan  Access Code: [email or web address]    Consulted and Agree with Plan of Care  Patient       Patient will benefit from skilled therapeutic intervention in order to improve the following deficits and impairments:     Visit Diagnosis: Chronic midline low back pain without sciatica  Abnormal posture  Muscle weakness (generalized)     Problem List Patient Active Problem List   Diagnosis Date Noted  . Recurrent UTI 12/26/2019  . Shoulder pain, left 12/26/2019  . Chronic bilateral low back pain without sciatica 06/28/2018  . Cervical cancer screening 05/02/2017  . Chronic migraine w/o aura w/o status migrainosus, not intractable 04/06/2017  . Obesity 11/10/2016  . Vitamin D deficiency 11/10/2016  . Preventative health care 11/10/2016  . History of chicken pox   . Allergy   . Diabetes mellitus type 2 in obese ([facility])   . Hyperlipidemia     Morton Peters, PT 03/24/20 12:06 PM    [facility] [address], [address] [place], [place], [address] Phone: [phone]   Fax:  [phone]  Name: EARA BURRUEL MRN: [id number] Date of Birth: 10-27-1971

## 2020-03-28 ENCOUNTER — Other Ambulatory Visit: Payer: Self-pay | Admitting: Family Medicine

## 2020-03-28 MED FILL — UNIFINE PENTIPS 32GX5/32: 32G X 4 MM | 90 days supply | Qty: 100 | Fill #0

## 2020-03-28 MED FILL — SAXENDA 18 MG/3 ML PEN: 18 | 30 days supply | Qty: 15 | Fill #4

## 2020-03-31 ENCOUNTER — Encounter: Payer: Self-pay | Admitting: Physical Therapy

## 2020-03-31 ENCOUNTER — Ambulatory Visit: Payer: 59 | Admitting: Physical Therapy

## 2020-03-31 ENCOUNTER — Other Ambulatory Visit: Payer: Self-pay

## 2020-03-31 DIAGNOSIS — R293 Abnormal posture: Secondary | ICD-10-CM

## 2020-03-31 DIAGNOSIS — M6281 Muscle weakness (generalized): Secondary | ICD-10-CM

## 2020-03-31 DIAGNOSIS — L920 Granuloma annulare: Secondary | ICD-10-CM | POA: Diagnosis not present

## 2020-03-31 DIAGNOSIS — G8929 Other chronic pain: Secondary | ICD-10-CM | POA: Diagnosis not present

## 2020-03-31 DIAGNOSIS — R252 Cramp and spasm: Secondary | ICD-10-CM

## 2020-03-31 DIAGNOSIS — M545 Low back pain: Secondary | ICD-10-CM | POA: Diagnosis not present

## 2020-03-31 NOTE — Therapy (Signed)
[facility] [address], [place], [place], [address] Phone: [phone]   Fax:  [phone]  Physical Therapy Treatment  Patient Details  Name: Amanda James MRN: [id number] Date of Birth: 09/05/1971 Referring Provider (PT): Mosie Lukes, MD   Encounter Date: 03/31/2020  PT End of Session - 03/31/20 0939    Visit Number  7    Date for PT Re-Evaluation  03/31/20    Authorization Type  [facility] [employer]    PT Start Time  0933    PT Stop Time  9169    PT Time Calculation (min)  42 min    Activity Tolerance  Patient tolerated treatment well;No increased pain    Behavior During Therapy  WFL for tasks assessed/performed       Past Medical History:  Diagnosis Date  . Allergy   . Cervical cancer screening 05/02/2017  . Depression   . Depression with anxiety   . Diabetes mellitus type 2 in obese ([place])   . Diabetes mellitus without complication ([place])   . History of chicken pox   . Hyperlipidemia   . Low back pain   . Migraines   . Obesity 11/10/2016    Past Surgical History:  Procedure Laterality Date  . ABDOMINOPLASTY  2012  . AUGMENTATION MAMMAPLASTY     maxilift with implants   . BLADDER SUSPENSION  2016  . LAPAROSCOPIC GASTRIC BANDING  2005   esophageal dilation. band still present but released  . MASTOPEXY  2012    There were no vitals filed for this visit.  Subjective Assessment - 03/31/20 0934    Subjective  Pt reports much improved awareness of core muscles but knows she has a way to go on strengthening.  LBP continues to limit standing to 5'.  Much imrpoved from flare up last week.    Limitations  Standing    How long can you stand comfortably?  5 min    How long can you walk comfortably?  15 min    Currently in Pain?  Yes    Pain Score  3     Pain Location  Back    Pain Orientation  Right;Left    Pain Descriptors / Indicators  Aching;Tightness    Pain Type  Chronic pain    Pain Onset  More  than a month ago    Pain Frequency  Intermittent    Aggravating Factors   standing up to 15', pain at 5' standing, prolonged bending for gardening    Pain Relieving Factors  heat, sitting, stretching    Effect of Pain on Daily Activities  stand in lines, run errands, garden         [facility] PT Assessment - 03/31/20 0001      Assessment   Medical Diagnosis  M54.5 (ICD-10-CM) - Low back pain, unspecified back pain laterality, unspecified chronicity, unspecified whether sciatica present    Referring Provider (PT)  Mosie Lukes, MD    Onset Date/Surgical Date  --   30 years ago   Hand Dominance  Right    Next MD Visit  as needed    Prior Therapy  for shoulder at this facility                   [facility] Adult PT Treatment/Exercise - 03/31/20 0001      Lumbar Exercises: Aerobic   Recumbent Bike  L3 x 6' with lumbar heat, PT present to review goals  Lumbar Exercises: Standing   Other Standing Lumbar Exercises  standing diag chops 30# bil x 15 reps      Lumbar Exercises: Seated   Long Arc Quad on Chair  Strengthening;Both;1 set;10 reps;Weights    LAQ on Chair Weights (lbs)  3    LAQ on Chair Limitations  seated on dynadisc      Lumbar Exercises: Supine   Other Supine Lumbar Exercises  mini crunch with red ball roll ups on thighs, 5x straight, 5x each diagonal      Knee/Hip Exercises: Stretches   Hip Flexor Stretch  Both;2 reps;30 seconds    Hip Flexor Stretch Limitations  kneeling x 1, standing x 1      Shoulder Exercises: Seated   Other Seated Exercises  bil shoulder flexion 2# seated on dynadisc      Manual Therapy   Manual Therapy  Myofascial release    Myofascial Release  skin rolling and thoracodorsal fascia Rt       Trigger Point Dry Needling - 03/31/20 0001    Consent Given?  Yes    Education Handout Provided  Previously provided    Muscles Treated Back/Hip  Lumbar multifidi    Other Dry Needling  Rt L3-S1    Lumbar multifidi Response  Twitch  response elicited;Palpable increased muscle length             PT Short Term Goals - 03/31/20 0937      PT SHORT TERM GOAL #1   Title  Pt will be ind in initial HEP    Status  Achieved      PT SHORT TERM GOAL #2   Title  Pt will be able to demo diaphragmatic abdominal breathing pattern and deep core muscle recruitment.    Status  Achieved      PT SHORT TERM GOAL #3   Title  Pt will be able to demo proper standing posture and body mechanics with sit to stand and squats.    Status  Achieved        PT Long Term Goals - 03/31/20 8828      PT LONG TERM GOAL #1   Title  Pt will be ind in advanced HEP and understand how to safely progress    Status  On-going      PT LONG TERM GOAL #2   Title  Pt will be able to demo Lt LE balance for 10+ sec with SI joint stability and glut medius activation.      PT LONG TERM GOAL #3   Title  Pt will achieve at least 4+/5 LE strength in bil hips to improve functional tasks such as gardening and household tasks.    Status  On-going      PT LONG TERM GOAL #4   Title  Reduce FOTO to </= 43%    Baseline  44%    Status  On-going      PT LONG TERM GOAL #5   Title  Pt will be able to stand for at least 20 min with pain not to exceed 3/10 to improve pain with errands.    Status  On-going            Plan - 03/31/20 1001    Clinical Impression Statement  Pt is making good progress in PT.  She has met all STGs and is making progress on LTGs. FOTO limitation has reduced from 56% to 44% limited with goal of 43%.  Flexibility, posture and core recruiment  are improving.  She continues to have pain in lumbar region limiting static standing x 5'.  Chronic lumbar lordotic posture yields tight myofascial structures throughout thoracodorsal fascia.  Pt has overuse of lower thoracic and lumbar paraspinals and weakness in abdominal wall and deep core which is imrpoving via stretching, manual techniques, core stab, and strengthening.  She will continue to  benefit from skilled progression along POC to address pain, weakness, posture and functional limitations.    Personal Factors and Comorbidities  Time since onset of injury/illness/exacerbation    Examination-Activity Limitations  Stand    Examination-Participation Restrictions  Community Activity;Shop;Yard Work;Cleaning    Stability/Clinical Decision Making  Stable/Uncomplicated    Clinical Decision Making  Low    Rehab Potential  Excellent    PT Frequency  1x / week    PT Duration  8 weeks    PT Treatment/Interventions  ADLs/Self Care Home Management;Cryotherapy;Electrical Stimulation;Moist Heat;Traction;Functional mobility training;Therapeutic activities;Therapeutic exercise;Balance training;Neuromuscular re-education;Manual techniques;Patient/family education;Passive range of motion;Dry needling;Taping;Spinal Manipulations;Joint Manipulations    PT Next Visit Plan  continue core stab, manual techniques, stretching/ROM    PT Home Exercise Plan  Access Code: [email or web address]    Consulted and Agree with Plan of Care  Patient       Patient will benefit from skilled therapeutic intervention in order to improve the following deficits and impairments:  Decreased coordination, Increased fascial restricitons, Increased muscle spasms, Decreased activity tolerance, Pain, Postural dysfunction, Decreased strength, Decreased balance, Hypomobility, Improper body mechanics  Visit Diagnosis: Chronic midline low back pain without sciatica - Plan: PT plan of care cert/re-cert  Abnormal posture - Plan: PT plan of care cert/re-cert  Muscle weakness (generalized) - Plan: PT plan of care cert/re-cert  Cramp and spasm - Plan: PT plan of care cert/re-cert     Problem List Patient Active Problem List   Diagnosis Date Noted  . Recurrent UTI 12/26/2019  . Shoulder pain, left 12/26/2019  . Chronic bilateral low back pain without sciatica 06/28/2018  . Cervical cancer screening 05/02/2017  . Chronic migraine w/o  aura w/o status migrainosus, not intractable 04/06/2017  . Obesity 11/10/2016  . Vitamin D deficiency 11/10/2016  . Preventative health care 11/10/2016  . History of chicken pox   . Allergy   . Diabetes mellitus type 2 in obese ([place])   . Hyperlipidemia     Baruch Merl, PT 03/31/20 10:57 AM   [facility] [address], [place], [place], [address] Phone: [phone]   Fax:  [phone]  Name: Amanda James MRN: [id number] Date of Birth: [birth year]

## 2020-04-07 ENCOUNTER — Other Ambulatory Visit: Payer: Self-pay

## 2020-04-07 ENCOUNTER — Ambulatory Visit: Payer: 59 | Admitting: Physical Therapy

## 2020-04-07 ENCOUNTER — Encounter: Payer: Self-pay | Admitting: Physical Therapy

## 2020-04-07 DIAGNOSIS — R252 Cramp and spasm: Secondary | ICD-10-CM | POA: Diagnosis not present

## 2020-04-07 DIAGNOSIS — M545 Low back pain, unspecified: Secondary | ICD-10-CM

## 2020-04-07 DIAGNOSIS — G8929 Other chronic pain: Secondary | ICD-10-CM | POA: Diagnosis not present

## 2020-04-07 DIAGNOSIS — M6281 Muscle weakness (generalized): Secondary | ICD-10-CM | POA: Diagnosis not present

## 2020-04-07 DIAGNOSIS — R293 Abnormal posture: Secondary | ICD-10-CM

## 2020-04-07 NOTE — Therapy (Signed)
[facility] [address] [place], [place], [address] Phone: [phone]   Fax:  [phone]  Physical Therapy Treatment  Patient Details  Name: Amanda James MRN: [id number] Date of Birth: 1971/08/23 Referring Provider (PT): Mosie Lukes, MD   Encounter Date: 04/07/2020  PT End of Session - 04/07/20 0936    Visit Number  8    Date for PT Re-Evaluation  05/26/20    Authorization Type  [employer]    PT Start Time  0932    PT Stop Time  1016    PT Time Calculation (min)  44 min    Activity Tolerance  Patient tolerated treatment well;No increased pain    Behavior During Therapy  WFL for tasks assessed/performed       Past Medical History:  Diagnosis Date  . Allergy   . Cervical cancer screening 05/02/2017  . Depression   . Depression with anxiety   . Diabetes mellitus type 2 in obese ([place])   . Diabetes mellitus without complication ([place])   . History of chicken pox   . Hyperlipidemia   . Low back pain   . Migraines   . Obesity 11/10/2016    Past Surgical History:  Procedure Laterality Date  . ABDOMINOPLASTY  2012  . AUGMENTATION MAMMAPLASTY     maxilift with implants   . BLADDER SUSPENSION  2016  . LAPAROSCOPIC GASTRIC BANDING  2005   esophageal dilation. band still present but released  . MASTOPEXY  2012    There were no vitals filed for this visit.  Subjective Assessment - 04/07/20 0934    Subjective  I have never felt so good as I did last week after last session.  I think the stretching and DN really helped.    Limitations  Standing    How long can you stand comfortably?  5 min    How long can you walk comfortably?  15 min    Currently in Pain?  Yes    Pain Score  3     Pain Location  Back    Pain Orientation  Right;Left;Lower    Pain Descriptors / Indicators  Aching;Tightness    Pain Type  Chronic pain    Pain Onset  More than a month ago    Pain Frequency  Intermittent     Aggravating Factors   5' standing pain starts    Pain Relieving Factors  heat, sitting, stretching    Effect of Pain on Daily Activities  stand in lines, garden, run errands                       [facility] Adult PT Treatment/Exercise - 04/07/20 0001      Exercises   Exercises  Lumbar;Knee/Hip;Shoulder      Lumbar Exercises: Aerobic   Recumbent Bike  L3 x 6' with lumbar heat, PT present to discuss last week's symptoms      Lumbar Exercises: Seated   Long Arc Quad on Chair  Strengthening;Both;1 set;10 reps;Weights    LAQ on Chair Weights (lbs)  3    LAQ on Chair Limitations  seated on dynadisc    Other Seated Lumbar Exercises  green ball sitting 5 each: pelvic circles, wags, tilts    Other Seated Lumbar Exercises  march 3# ankle weights alt sitting on dynadisc      Lumbar Exercises: Supine   Other Supine Lumbar Exercises  mini crunch with red ball roll  ups on thighs, 5x straight, 5x each diagonal      Knee/Hip Exercises: Stretches   Hip Flexor Stretch  Both;2 reps;30 seconds      Shoulder Exercises: Seated   Flexion  Strengthening;Both;5 reps;Weights    Flexion Weight (lbs)  3    Flexion Limitations  on dynadisc, 5 x flexion/scaption alternating      Shoulder Exercises: ROM/Strengthening   Other ROM/Strengthening Exercises  diag chops 30# bil x 15 each way with focus on trunk rot vs UEs driving exercise      Manual Therapy   Manual Therapy  Joint mobilization;Soft tissue mobilization;Myofascial release    Joint Mobilization  Rt PA L5 for L5/S1 facet Gr II/III/IV    Soft tissue mobilization  lumbar multifidi elongation after DN    Myofascial Release  thoracodorsal fascia       Trigger Point Dry Needling - 04/07/20 0001    Consent Given?  Yes    Education Handout Provided  Previously provided    Muscles Treated Back/Hip  Lumbar multifidi    Other Dry Needling  bil L3-L5    Lumbar multifidi Response  Twitch response elicited;Palpable increased muscle length              PT Short Term Goals - 03/31/20 0937      PT SHORT TERM GOAL #1   Title  Pt will be ind in initial HEP    Status  Achieved      PT SHORT TERM GOAL #2   Title  Pt will be able to demo diaphragmatic abdominal breathing pattern and deep core muscle recruitment.    Status  Achieved      PT SHORT TERM GOAL #3   Title  Pt will be able to demo proper standing posture and body mechanics with sit to stand and squats.    Status  Achieved        PT Long Term Goals - 03/31/20 3785      PT LONG TERM GOAL #1   Title  Pt will be ind in advanced HEP and understand how to safely progress    Status  On-going      PT LONG TERM GOAL #2   Title  Pt will be able to demo Lt LE balance for 10+ sec with SI joint stability and glut medius activation.      PT LONG TERM GOAL #3   Title  Pt will achieve at least 4+/5 LE strength in bil hips to improve functional tasks such as gardening and household tasks.    Status  On-going      PT LONG TERM GOAL #4   Title  Reduce FOTO to </= 43%    Baseline  44%    Status  On-going      PT LONG TERM GOAL #5   Title  Pt will be able to stand for at least 20 min with pain not to exceed 3/10 to improve pain with errands.    Status  On-going            Plan - 04/07/20 8850    Clinical Impression Statement  Pt had a great week with improved pain last week after PT session.  She said all aggravating activities were less aggravating.  PT continues to progress strength with focus on abdominals and elongation of thoracolumbar paraspinals.  Pt with improving ribcage mobility and stability with dynamic ther ex noted.  Improving hip flexor and quad flexiblity is reducing lordotic pull on  lumbar spine.  PT performed DN again to lumbar region given signif relief last week with this technique.  Continue along POC with focus on progressing strength/stability and flexibilty.    Rehab Potential  Excellent    PT Frequency  1x / week    PT Duration  8 weeks     PT Treatment/Interventions  ADLs/Self Care Home Management;Cryotherapy;Electrical Stimulation;Moist Heat;Traction;Functional mobility training;Therapeutic activities;Therapeutic exercise;Balance training;Neuromuscular re-education;Manual techniques;Patient/family education;Passive range of motion;Dry needling;Taping;Spinal Manipulations;Joint Manipulations    PT Next Visit Plan  continue core stab, manual techniques, stretching/ROM    PT Home Exercise Plan  Access Code: [email or web address]    Consulted and Agree with Plan of Care  Patient       Patient will benefit from skilled therapeutic intervention in order to improve the following deficits and impairments:     Visit Diagnosis: Chronic midline low back pain without sciatica  Abnormal posture  Muscle weakness (generalized)  Cramp and spasm     Problem List Patient Active Problem List   Diagnosis Date Noted  . Recurrent UTI 12/26/2019  . Shoulder pain, left 12/26/2019  . Chronic bilateral low back pain without sciatica 06/28/2018  . Cervical cancer screening 05/02/2017  . Chronic migraine w/o aura w/o status migrainosus, not intractable 04/06/2017  . Obesity 11/10/2016  . Vitamin D deficiency 11/10/2016  . Preventative health care 11/10/2016  . History of chicken pox   . Allergy   . Diabetes mellitus type 2 in obese ([facility])   . Hyperlipidemia     Morton Peters, PT 04/07/20 12:08 PM   [facility] [address] [place], [place], [address] Phone: [phone]   Fax:  [phone]  Name: Amanda James MRN: [id number] Date of Birth: May 19, 1971

## 2020-04-20 NOTE — Progress Notes (Signed)

## 2020-04-21 ENCOUNTER — Encounter: Payer: Self-pay | Admitting: Physical Therapy

## 2020-04-21 ENCOUNTER — Other Ambulatory Visit: Payer: Self-pay

## 2020-04-21 ENCOUNTER — Ambulatory Visit (INDEPENDENT_AMBULATORY_CARE_PROVIDER_SITE_OTHER): Payer: 59 | Admitting: Neurology

## 2020-04-21 ENCOUNTER — Ambulatory Visit: Payer: 59 | Attending: Family Medicine | Admitting: Physical Therapy

## 2020-04-21 DIAGNOSIS — M6281 Muscle weakness (generalized): Secondary | ICD-10-CM | POA: Diagnosis not present

## 2020-04-21 DIAGNOSIS — G8929 Other chronic pain: Secondary | ICD-10-CM | POA: Diagnosis not present

## 2020-04-21 DIAGNOSIS — G43709 Chronic migraine without aura, not intractable, without status migrainosus: Secondary | ICD-10-CM

## 2020-04-21 DIAGNOSIS — R293 Abnormal posture: Secondary | ICD-10-CM | POA: Diagnosis not present

## 2020-04-21 DIAGNOSIS — R252 Cramp and spasm: Secondary | ICD-10-CM | POA: Diagnosis not present

## 2020-04-21 DIAGNOSIS — M545 Low back pain: Secondary | ICD-10-CM | POA: Insufficient documentation

## 2020-04-21 NOTE — Therapy (Signed)
[facility] [address], [address] [place], [place], [address] Phone: [phone]   Fax:  [phone]  Physical Therapy Treatment  Patient Details  Name: Amanda James MRN: [id number] Date of Birth: 20-Nov-1971 Referring Provider (PT): Bradd Canary, MD   Encounter Date: 04/21/2020  PT End of Session - 04/21/20 1017    Visit Number  9    Date for PT Re-Evaluation  05/26/20    Authorization Type  [employer] [employer]    PT Start Time  0930    PT Stop Time  1016    PT Time Calculation (min)  46 min    Activity Tolerance  Patient tolerated treatment well;No increased pain    Behavior During Therapy  WFL for tasks assessed/performed       Past Medical History:  Diagnosis Date  . Allergy   . Cervical cancer screening 05/02/2017  . Depression   . Depression with anxiety   . Diabetes mellitus type 2 in obese ([facility])   . Diabetes mellitus without complication ([facility])   . History of chicken pox   . Hyperlipidemia   . Low back pain   . Migraines   . Obesity 11/10/2016    Past Surgical History:  Procedure Laterality Date  . ABDOMINOPLASTY  2012  . AUGMENTATION MAMMAPLASTY     maxilift with implants   . BLADDER SUSPENSION  2016  . LAPAROSCOPIC GASTRIC BANDING  2005   esophageal dilation. band still present but released  . MASTOPEXY  2012    There were no vitals filed for this visit.  Subjective Assessment - 04/21/20 0935    Subjective  Pain today is a 2/10.  I pushed more walking and longer standing which did increase my pain but it didn't last.    Limitations  Standing    How long can you stand comfortably?  8 min    How long can you walk comfortably?  15 min    Currently in Pain?  Yes    Pain Score  2     Pain Location  Back    Pain Orientation  Right;Left;Lower    Pain Descriptors / Indicators  Aching;Tightness    Pain Type  Chronic pain    Pain Onset  More than a month ago    Pain Frequency  Intermittent    Aggravating Factors   8' standing    Pain Relieving Factors  heat, sitting, stretching    Effect of Pain on Daily Activities  stand in lines, garden, run errands                        [facility] Adult PT Treatment/Exercise - 04/21/20 0001      Exercises   Exercises  Lumbar;Knee/Hip      Lumbar Exercises: Aerobic   Recumbent Bike  L3 x 6' with lumbar heat, PT present to review progress and symptoms      Lumbar Exercises: Supine   Advanced Lumbar Stabilization Limitations  supine 90/90 small range leg lowering keeping knee at 90 deg x 6 reps    Large Ball Abdominal Isometric  5 reps;3 seconds    Large Ball Abdominal Isometric Limitations  with red hip abd isom    Large Ball Oblique Isometric  5 reps;3 seconds    Large Ball Oblique Isometric Limitations  with red hip abd isom    Other Supine Lumbar Exercises  10 reps: feet on red ball roll in/out with 3#  alt shoulder overhead with ball rolling in, alt UEs      Lumbar Exercises: Quadruped   Plank  on all 4s 5x10 sec TrA hold, add fire hydrant and row x 2x5 sets each      Shoulder Exercises: Seated   Other Seated Exercises  on dynadisc diag 3# 3 reps each, then stand and repeat      Shoulder Exercises: ROM/Strengthening   Other ROM/Strengthening Exercises  diag chops 30# bil x 10 each way with focus on trunk rot vs UEs driving exercise      Manual Therapy   Joint Mobilization  Rt PA L5 for L5/S1 facet Gr II/III/IV    Soft tissue mobilization  lumbar multifidi elongation after DN    Myofascial Release  thoracodorsal fascia       Trigger Point Dry Needling - 04/21/20 0001    Consent Given?  Yes    Education Handout Provided  Previously provided    Muscles Treated Back/Hip  Lumbar multifidi    Other Dry Needling  bil L3-L5    Lumbar multifidi Response  Twitch response elicited;Palpable increased muscle length             PT Short Term Goals - 03/31/20 0937      PT SHORT TERM GOAL #1   Title  Pt will be ind  in initial HEP    Status  Achieved      PT SHORT TERM GOAL #2   Title  Pt will be able to demo diaphragmatic abdominal breathing pattern and deep core muscle recruitment.    Status  Achieved      PT SHORT TERM GOAL #3   Title  Pt will be able to demo proper standing posture and body mechanics with sit to stand and squats.    Status  Achieved        PT Long Term Goals - 04/21/20 0937      PT LONG TERM GOAL #1   Title  Pt will be ind in advanced HEP and understand how to safely progress    Status  On-going      PT LONG TERM GOAL #2   Title  Pt will be able to demo Lt LE balance for 10+ sec with SI joint stability and glut medius activation.      PT LONG TERM GOAL #3   Title  Pt will achieve at least 4+/5 LE strength in bil hips to improve functional tasks such as gardening and household tasks.    Status  On-going      PT LONG TERM GOAL #4   Title  Reduce FOTO to </= 43%    Baseline  44%      PT LONG TERM GOAL #5   Title  Pt will be able to stand for at least 20 min with pain not to exceed 3/10 to improve pain with errands.    Status  On-going            Plan - 04/21/20 1206    Clinical Impression Statement  Pt reports improving tolerance of longer periods of walking and standing.  Standing is still most aggravating activity and limits her from standing >8' without pain.  She demonstrates much improved fascial mobility and segmental motion for flexion throughout lumbar spine.  She demos improving control and strength of abdominal wall.  She continues to have increased lower thoracic tone Rt>Lt in paraspinals and some mobility restrictions in lower ribcage.  LE flexibility and hip strength  are improving.  PT performed bil deep multifidus DN today since this intervention seemed to help Pt round the corner for pain and activity tolerance over past 2 visits.  Continue along POC for continued strength, stability and activity tolerance.    PT Frequency  1x / week    PT Duration  8  weeks    PT Treatment/Interventions  ADLs/Self Care Home Management;Cryotherapy;Electrical Stimulation;Moist Heat;Traction;Functional mobility training;Therapeutic activities;Therapeutic exercise;Balance training;Neuromuscular re-education;Manual techniques;Patient/family education;Passive range of motion;Dry needling;Taping;Spinal Manipulations;Joint Manipulations    PT Next Visit Plan  review LTGs, lower thoracic mobs and myofascial, continue core stab, manual techniques, stretching/ROM    PT Home Exercise Plan  Access Code: [email or web address]    Recommended Other Services  re-cert or d/c by 6/18    Consulted and Agree with Plan of Care  Patient       Patient will benefit from skilled therapeutic intervention in order to improve the following deficits and impairments:     Visit Diagnosis: Chronic midline low back pain without sciatica  Abnormal posture  Muscle weakness (generalized)  Cramp and spasm     Problem List Patient Active Problem List   Diagnosis Date Noted  . Recurrent UTI 12/26/2019  . Shoulder pain, left 12/26/2019  . Chronic bilateral low back pain without sciatica 06/28/2018  . Cervical cancer screening 05/02/2017  . Chronic migraine w/o aura w/o status migrainosus, not intractable 04/06/2017  . Obesity 11/10/2016  . Vitamin D deficiency 11/10/2016  . Preventative health care 11/10/2016  . History of chicken pox   . Allergy   . Diabetes mellitus type 2 in obese ([facility])   . Hyperlipidemia     Morton Peters, PT 04/21/20 12:11 PM   [facility] [address] [place], [place], [address] Phone: [phone]   Fax:  [phone]  Name: Amanda James MRN: [id number] Date of Birth: [birth year]

## 2020-04-28 ENCOUNTER — Ambulatory Visit: Payer: 59 | Admitting: Physical Therapy

## 2020-04-28 ENCOUNTER — Encounter: Payer: Self-pay | Admitting: Physical Therapy

## 2020-04-28 ENCOUNTER — Other Ambulatory Visit: Payer: Self-pay

## 2020-04-28 DIAGNOSIS — M6281 Muscle weakness (generalized): Secondary | ICD-10-CM | POA: Diagnosis not present

## 2020-04-28 DIAGNOSIS — R252 Cramp and spasm: Secondary | ICD-10-CM | POA: Diagnosis not present

## 2020-04-28 DIAGNOSIS — G8929 Other chronic pain: Secondary | ICD-10-CM

## 2020-04-28 DIAGNOSIS — R293 Abnormal posture: Secondary | ICD-10-CM | POA: Diagnosis not present

## 2020-04-28 DIAGNOSIS — M545 Low back pain, unspecified: Secondary | ICD-10-CM

## 2020-04-28 NOTE — Therapy (Signed)
[facility] [address] [place], [place], [address] Phone: [phone]   Fax:  [phone]  Physical Therapy Treatment  Patient Details  Name: Amanda James MRN: [id number] Date of Birth: 1971/01/31 Referring Provider (PT): Bradd Canary, MD   Encounter Date: 04/28/2020  PT End of Session - 04/28/20 0936    Visit Number  10    Date for PT Re-Evaluation  05/26/20    Authorization Type  [employer] [employer]    PT Start Time  0930    PT Stop Time  1015    PT Time Calculation (min)  45 min    Activity Tolerance  Patient tolerated treatment well;No increased pain    Behavior During Therapy  WFL for tasks assessed/performed       Past Medical History:  Diagnosis Date  . Allergy   . Cervical cancer screening 05/02/2017  . Depression   . Depression with anxiety   . Diabetes mellitus type 2 in obese ([facility])   . Diabetes mellitus without complication ([facility])   . History of chicken pox   . Hyperlipidemia   . Low back pain   . Migraines   . Obesity 11/10/2016    Past Surgical History:  Procedure Laterality Date  . ABDOMINOPLASTY  2012  . AUGMENTATION MAMMAPLASTY     maxilift with implants   . BLADDER SUSPENSION  2016  . LAPAROSCOPIC GASTRIC BANDING  2005   esophageal dilation. band still present but released  . MASTOPEXY  2012    There were no vitals filed for this visit.  Subjective Assessment - 04/28/20 0931    Subjective  1/10 pain.  I am able to tolerate up to 20 min of household/yard tasks now compared to 10' before pain.  No pain at work which used to be mild pain during work day    Limitations  Standing    How long can you stand comfortably?  8 min    How long can you walk comfortably?  15 min    Currently in Pain?  Yes    Pain Score  1     Pain Location  Back    Pain Orientation  Right;Left;Lower    Pain Descriptors / Indicators  Aching;Tightness    Pain Type  Chronic pain    Pain Onset  More than a  month ago    Pain Frequency  Intermittent    Aggravating Factors   prolonged standing    Pain Relieving Factors  heat/sitting, stretching    Effect of Pain on Daily Activities  stand in lines, garden, run errands                        [facility] Adult PT Treatment/Exercise - 04/28/20 0001      Exercises   Exercises  Lumbar;Knee/Hip      Lumbar Exercises: Supine   Dead Bug  5 reps;3 seconds    Advanced Lumbar Stabilization Limitations  supine 90/90 small range leg lowering keeping knee at 90 deg x 5 reps    Other Supine Lumbar Exercises  red ball rollups straight and diagonal each x 5 reps holding 2# dumbbells bil      Lumbar Exercises: Quadruped   Other Quadruped Lumbar Exercises  cat/cow with tail wag and trunk SB for elongation of contralateral trunk      Shoulder Exercises: Seated   Other Seated Exercises  on dynadisc diag 3# 3 reps each, then stand  and repeat      Shoulder Exercises: Standing   Extension  Strengthening;Both;Theraband;10 reps    Theraband Level (Shoulder Extension)  Level 2 (Red)    Extension Limitations  standing with foot on 6" step, postural awareness with core cueing by PT       Trigger Point Dry Needling - 04/28/20 0001    Consent Given?  Yes    Education Handout Provided  Previously provided    Muscles Treated Lower Quadrant  Vastus lateralis;Rectus femoris    Other Dry Needling  Lt>Rt twitch/release    Rectus femoris Response  Twitch response elicited;Palpable increased muscle length    Vastus lateralis Response  Twitch response elicited;Palpable increased muscle length             PT Short Term Goals - 03/31/20 0937      PT SHORT TERM GOAL #1   Title  Pt will be ind in initial HEP    Status  Achieved      PT SHORT TERM GOAL #2   Title  Pt will be able to demo diaphragmatic abdominal breathing pattern and deep core muscle recruitment.    Status  Achieved      PT SHORT TERM GOAL #3   Title  Pt will be able to demo proper  standing posture and body mechanics with sit to stand and squats.    Status  Achieved        PT Long Term Goals - 04/21/20 0937      PT LONG TERM GOAL #1   Title  Pt will be ind in advanced HEP and understand how to safely progress    Status  On-going      PT LONG TERM GOAL #2   Title  Pt will be able to demo Lt LE balance for 10+ sec with SI joint stability and glut medius activation.      PT LONG TERM GOAL #3   Title  Pt will achieve at least 4+/5 LE strength in bil hips to improve functional tasks such as gardening and household tasks.    Status  On-going      PT LONG TERM GOAL #4   Title  Reduce FOTO to </= 43%    Baseline  44%      PT LONG TERM GOAL #5   Title  Pt will be able to stand for at least 20 min with pain not to exceed 3/10 to improve pain with errands.    Status  On-going            Plan - 04/28/20 1159    Clinical Impression Statement  Pt reports painfree work days.  She is tolerating up to 20 min of dynamic household tasks before any pain as compared to 10 min previously.  She demos much improving core awareness and is working on more complex/dynamic core challenges in various postures.  She has some tightness/trigger points in anterior proximal thighs.  PT performed DN to rectus femoris and vastus lateralis with twitch release Lt>Rt.  Pt was able to do hip flexor stretch without back pain end of session.  Continue along POC with progression of dynamic postural stability.    Rehab Potential  Excellent    PT Frequency  1x / week    PT Duration  8 weeks    PT Treatment/Interventions  ADLs/Self Care Home Management;Cryotherapy;Electrical Stimulation;Moist Heat;Traction;Functional mobility training;Therapeutic activities;Therapeutic exercise;Balance training;Neuromuscular re-education;Manual techniques;Patient/family education;Passive range of motion;Dry needling;Taping;Spinal Manipulations;Joint Manipulations    PT Next  Visit Plan  check LTGs, SLS balance, f/u  on DN to rectus femoris/vastus lateralis (Lt>Rt), quad/hip flexor release/STM/stretching, dynamic core progression    PT Home Exercise Plan  Access Code: [email or web address]    Consulted and Agree with Plan of Care  Patient       Patient will benefit from skilled therapeutic intervention in order to improve the following deficits and impairments:     Visit Diagnosis: Chronic midline low back pain without sciatica  Abnormal posture  Muscle weakness (generalized)     Problem List Patient Active Problem List   Diagnosis Date Noted  . Recurrent UTI 12/26/2019  . Shoulder pain, left 12/26/2019  . Chronic bilateral low back pain without sciatica 06/28/2018  . Cervical cancer screening 05/02/2017  . Chronic migraine w/o aura w/o status migrainosus, not intractable 04/06/2017  . Obesity 11/10/2016  . Vitamin D deficiency 11/10/2016  . Preventative health care 11/10/2016  . History of chicken pox   . Allergy   . Diabetes mellitus type 2 in obese ([facility])   . Hyperlipidemia     Morton Peters, PT 04/28/20 12:05 PM   [facility] [address] [place], [place], [address] Phone: [phone]   Fax:  [phone]  Name: JURNI CESARO MRN: [id number] Date of Birth: June 25, 1971

## 2020-05-05 ENCOUNTER — Encounter: Payer: Self-pay | Admitting: Physical Therapy

## 2020-05-05 ENCOUNTER — Ambulatory Visit: Payer: 59 | Admitting: Physical Therapy

## 2020-05-05 DIAGNOSIS — G8929 Other chronic pain: Secondary | ICD-10-CM

## 2020-05-05 DIAGNOSIS — R293 Abnormal posture: Secondary | ICD-10-CM

## 2020-05-05 DIAGNOSIS — M6281 Muscle weakness (generalized): Secondary | ICD-10-CM

## 2020-05-05 DIAGNOSIS — R252 Cramp and spasm: Secondary | ICD-10-CM

## 2020-05-05 DIAGNOSIS — M545 Low back pain: Secondary | ICD-10-CM | POA: Diagnosis not present

## 2020-05-05 NOTE — Therapy (Signed)
[facility] [address], [address] [place], [place], [address] Phone: [phone]   Fax:  [phone]  Physical Therapy Treatment  Patient Details  Name: Amanda James MRN: [id number] Date of Birth: 1971/09/06 Referring Provider (PT): Bradd Canary, MD   Encounter Date: 05/05/2020  PT End of Session - 05/05/20 0939    Visit Number  11    Date for PT Re-Evaluation  05/26/20    Authorization Type  [employer] [employer]    PT Start Time  [phone]    PT Stop Time  1015    PT Time Calculation (min)  41 min    Activity Tolerance  Patient tolerated treatment well;No increased pain    Behavior During Therapy  WFL for tasks assessed/performed       Past Medical History:  Diagnosis Date  . Allergy   . Cervical cancer screening 05/02/2017  . Depression   . Depression with anxiety   . Diabetes mellitus type 2 in obese (HCC)   . Diabetes mellitus without complication (HCC)   . History of chicken pox   . Hyperlipidemia   . Low back pain   . Migraines   . Obesity 11/10/2016    Past Surgical History:  Procedure Laterality Date  . ABDOMINOPLASTY  2012  . AUGMENTATION MAMMAPLASTY     maxilift with implants   . BLADDER SUSPENSION  2016  . LAPAROSCOPIC GASTRIC BANDING  2005   esophageal dilation. band still present but released  . MASTOPEXY  2012    There were no vitals filed for this visit.  Subjective Assessment - 05/05/20 0937    Subjective  Increased pain over past week due to increased need for standing at work.  The DN to anterior thighs was good but maybe not as helpful as lumbar DN    Limitations  Standing    How long can you stand comfortably?  8 min    How long can you walk comfortably?  15 min    Currently in Pain?  Yes    Pain Score  3     Pain Location  Back    Pain Orientation  Right;Left;Lower    Pain Descriptors / Indicators  Aching;Tightness    Pain Type  Chronic pain    Pain Onset  More than a month ago    Pain  Frequency  Intermittent    Aggravating Factors   prolonged standing    Pain Relieving Factors  heat/sitting, stretching    Effect of Pain on Daily Activities  stand in lines, run errands, garden                        [facility] Adult PT Treatment/Exercise - 05/05/20 0001      Exercises   Exercises  Shoulder      Lumbar Exercises: Stretches   Other Lumbar Stretch Exercise  tail wag with SB, sink into prayer for Rt trunk and QL stretch x 30 sec      Lumbar Exercises: Machines for Strengthening   Other Lumbar Machine Exercise  UBE L2 2x2, PT present to discuss symptoms      Lumbar Exercises: Seated   Hip Flexion on Ball Limitations  seated on dynadisc with 3lb on each thigh marching 2x5, slow for trunk control      Knee/Hip Exercises: Stretches   Hip Flexor Stretch  Both;2 reps;20 seconds      Shoulder Exercises: Standing   Flexion  Both;10  reps;Strengthening    Shoulder Flexion Weight (lbs)  3    Flexion Limitations  standing on black pad    ABduction  Strengthening;Both;10 reps;Weights    Shoulder ABduction Weight (lbs)  3    ABduction Limitations  standing on black pad    Extension  Strengthening;Both;Theraband;5 reps    Theraband Level (Shoulder Extension)  Level 1 (Yellow)    Extension Limitations  standing with foot on 6" step, postural awareness with core cueing by PT      Manual Therapy   Manual Therapy  Soft tissue mobilization;Myofascial release    Soft tissue mobilization  Rt QL, thoracic paraspinal stripping    Myofascial Release  Rt thoracodorsal fascia and midline lumbar skin rolling       Trigger Point Dry Needling - 05/05/20 0001    Consent Given?  Yes    Education Handout Provided  Previously provided    Muscles Treated Back/Hip  Lumbar multifidi    Dry Needling Comments  bil L4-S1    Lumbar multifidi Response  Twitch response elicited;Palpable increased muscle length             PT Short Term Goals - 03/31/20 0937      PT SHORT  TERM GOAL #1   Title  Pt will be ind in initial HEP    Status  Achieved      PT SHORT TERM GOAL #2   Title  Pt will be able to demo diaphragmatic abdominal breathing pattern and deep core muscle recruitment.    Status  Achieved      PT SHORT TERM GOAL #3   Title  Pt will be able to demo proper standing posture and body mechanics with sit to stand and squats.    Status  Achieved        PT Long Term Goals - 04/21/20 0937      PT LONG TERM GOAL #1   Title  Pt will be ind in advanced HEP and understand how to safely progress    Status  On-going      PT LONG TERM GOAL #2   Title  Pt will be able to demo Lt LE balance for 10+ sec with SI joint stability and glut medius activation.      PT LONG TERM GOAL #3   Title  Pt will achieve at least 4+/5 LE strength in bil hips to improve functional tasks such as gardening and household tasks.    Status  On-going      PT LONG TERM GOAL #4   Title  Reduce FOTO to </= 43%    Baseline  44%      PT LONG TERM GOAL #5   Title  Pt will be able to stand for at least 20 min with pain not to exceed 3/10 to improve pain with errands.    Status  On-going            Plan - 05/05/20 1200    Clinical Impression Statement  Pt reported increased pain over past week due to increased standing demands at work.  She did not notice a signif improvement following DN to anterior thighs after last visit but had noted help with lumbar DN.  PT revisited lumbar DN with increased response/release Rt>Lt L4-S1.  Pt with much improved ability to perform ind self-checks in standing for alignment and core recruitment.  PT starting to transition some ther ex/stabilization to standing.  Pt with fair/poor SLS balance and needs UE support  x 1 for SLS challenges.  Pt will continue to benefit from skilled PT to address lumbar and thoracic soft tissue restrictions and stabilization/strength to imrpove pain with standing tasks.    Stability/Clinical Decision Making   Stable/Uncomplicated    Rehab Potential  Excellent    PT Frequency  1x / week    PT Duration  8 weeks    PT Treatment/Interventions  ADLs/Self Care Home Management;Cryotherapy;Electrical Stimulation;Moist Heat;Traction;Functional mobility training;Therapeutic activities;Therapeutic exercise;Balance training;Neuromuscular re-education;Manual techniques;Patient/family education;Passive range of motion;Dry needling;Taping;Spinal Manipulations;Joint Manipulations    PT Next Visit Plan  check LTGs, SLS balance, f/u on DN to rectus femoris/vastus lateralis (Lt>Rt), quad/hip flexor release/STM/stretching, dynamic core progression    PT Home Exercise Plan  Access Code: [email or web address]    Consulted and Agree with Plan of Care  Patient       Patient will benefit from skilled therapeutic intervention in order to improve the following deficits and impairments:     Visit Diagnosis: Chronic midline low back pain without sciatica  Abnormal posture  Muscle weakness (generalized)  Cramp and spasm     Problem List Patient Active Problem List   Diagnosis Date Noted  . Recurrent UTI 12/26/2019  . Shoulder pain, left 12/26/2019  . Chronic bilateral low back pain without sciatica 06/28/2018  . Cervical cancer screening 05/02/2017  . Chronic migraine w/o aura w/o status migrainosus, not intractable 04/06/2017  . Obesity 11/10/2016  . Vitamin D deficiency 11/10/2016  . Preventative health care 11/10/2016  . History of chicken pox   . Allergy   . Diabetes mellitus type 2 in obese (HCC)   . Hyperlipidemia     Morton Peters, PT 05/05/20 12:04 PM   [facility] [address] [place], [place], [address] Phone: [phone]   Fax:  [phone]  Name: Amanda James MRN: [id number] Date of Birth: 1971-06-11

## 2020-05-12 ENCOUNTER — Ambulatory Visit: Payer: 59 | Attending: Family Medicine | Admitting: Physical Therapy

## 2020-05-12 ENCOUNTER — Encounter: Payer: Self-pay | Admitting: Physical Therapy

## 2020-05-12 ENCOUNTER — Other Ambulatory Visit: Payer: Self-pay

## 2020-05-12 DIAGNOSIS — R293 Abnormal posture: Secondary | ICD-10-CM | POA: Insufficient documentation

## 2020-05-12 DIAGNOSIS — M6281 Muscle weakness (generalized): Secondary | ICD-10-CM | POA: Diagnosis not present

## 2020-05-12 DIAGNOSIS — G8929 Other chronic pain: Secondary | ICD-10-CM | POA: Insufficient documentation

## 2020-05-12 DIAGNOSIS — M545 Low back pain, unspecified: Secondary | ICD-10-CM

## 2020-05-12 DIAGNOSIS — R252 Cramp and spasm: Secondary | ICD-10-CM | POA: Insufficient documentation

## 2020-05-12 NOTE — Therapy (Signed)
[facility] [address], [address] [place], [place], [address] Phone: [phone]   Fax:  [phone]  Physical Therapy Treatment  Patient Details  Name: Amanda James MRN: [id number] Date of Birth: 11/11/1971 Referring Provider (PT): Bradd Canary, MD   Encounter Date: 05/12/2020  PT End of Session - 05/12/20 1021    Visit Number  12    Date for PT Re-Evaluation  05/26/20    Authorization Type  [employer] [employer]    PT Start Time  1015    PT Stop Time  1102    PT Time Calculation (min)  47 min    Activity Tolerance  Patient tolerated treatment well;No increased pain    Behavior During Therapy  WFL for tasks assessed/performed       Past Medical History:  Diagnosis Date  . Allergy   . Cervical cancer screening 05/02/2017  . Depression   . Depression with anxiety   . Diabetes mellitus type 2 in obese (HCC)   . Diabetes mellitus without complication (HCC)   . History of chicken pox   . Hyperlipidemia   . Low back pain   . Migraines   . Obesity 11/10/2016    Past Surgical History:  Procedure Laterality Date  . ABDOMINOPLASTY  2012  . AUGMENTATION MAMMAPLASTY     maxilift with implants   . BLADDER SUSPENSION  2016  . LAPAROSCOPIC GASTRIC BANDING  2005   esophageal dilation. band still present but released  . MASTOPEXY  2012    There were no vitals filed for this visit.  Subjective Assessment - 05/12/20 1016    Subjective  Pain is better than last week.  Pt reports improved tolerance of standing with less pain and improved postural awareness.    Limitations  Standing    How long can you stand comfortably?  8 min    How long can you walk comfortably?  15 min    Currently in Pain?  Yes    Pain Score  2     Pain Location  Back    Pain Orientation  Right;Left;Lower    Pain Descriptors / Indicators  Aching;Tightness    Pain Type  Chronic pain    Pain Onset  More than a month ago    Pain Frequency  Intermittent     Aggravating Factors   standing > 10 min    Pain Relieving Factors  heat/sitting, stretching    Effect of Pain on Daily Activities  stand in lines, run errands, garden                                  PT Short Term Goals - 03/31/20 0937      PT SHORT TERM GOAL #1   Title  Pt will be ind in initial HEP    Status  Achieved      PT SHORT TERM GOAL #2   Title  Pt will be able to demo diaphragmatic abdominal breathing pattern and deep core muscle recruitment.    Status  Achieved      PT SHORT TERM GOAL #3   Title  Pt will be able to demo proper standing posture and body mechanics with sit to stand and squats.    Status  Achieved        PT Long Term Goals - 04/21/20 0937      PT LONG TERM GOAL #1  Title  Pt will be ind in advanced HEP and understand how to safely progress    Status  On-going      PT LONG TERM GOAL #2   Title  Pt will be able to demo Lt LE balance for 10+ sec with SI joint stability and glut medius activation.      PT LONG TERM GOAL #3   Title  Pt will achieve at least 4+/5 LE strength in bil hips to improve functional tasks such as gardening and household tasks.    Status  On-going      PT LONG TERM GOAL #4   Title  Reduce FOTO to </= 43%    Baseline  44%      PT LONG TERM GOAL #5   Title  Pt will be able to stand for at least 20 min with pain not to exceed 3/10 to improve pain with errands.    Status  On-going            Plan - 05/12/20 1145    Clinical Impression Statement  Pt reports improving standing tolerance and pain levels since starting PT.  Pain is aggravated after 10 min of standing but is not as severe as before.  She is able to perform self checks to improve posture and core use to reduce pain when it spikes.  She continues to benefit from DN to lumbar multifidi so PT repeated this today.  A signif portion of time was spent with manual techniques to mid and lower thoracic spine to improve joint mobility, soft  tissue extensibility and fascial mobility.  Pt is very restricted in PA glides and ribcage expansion bil T5-T12.  She tolerates deep tissue and high level grade mobs although was sore end of session.  Foam roller ext at multiple thoracic seg with hands behind head was used to demo home tool to gain better mobility today.  Pt will continue to benefit from skilled PT along POC.  Re-eval next visit.    Examination-Participation Restrictions  Community Activity;Shop;Yard Work;Cleaning    Rehab Potential  Excellent    PT Frequency  1x / week    PT Duration  8 weeks    PT Treatment/Interventions  ADLs/Self Care Home Management;Cryotherapy;Electrical Stimulation;Moist Heat;Traction;Functional mobility training;Therapeutic activities;Therapeutic exercise;Balance training;Neuromuscular re-education;Manual techniques;Patient/family education;Passive range of motion;Dry needling;Taping;Spinal Manipulations;Joint Manipulations    PT Next Visit Plan  RE-EVAL, review LTGs, discuss plan for renewal or d/c, f/u on deep thoracic mob/STM work and foam roller ext from last visit, continue core and trunk mobility    PT Home Exercise Plan  Access Code: [email or web address]    Consulted and Agree with Plan of Care  Patient       Patient will benefit from skilled therapeutic intervention in order to improve the following deficits and impairments:     Visit Diagnosis: Chronic midline low back pain without sciatica  Abnormal posture  Muscle weakness (generalized)  Cramp and spasm     Problem List Patient Active Problem List   Diagnosis Date Noted  . Recurrent UTI 12/26/2019  . Shoulder pain, left 12/26/2019  . Chronic bilateral low back pain without sciatica 06/28/2018  . Cervical cancer screening 05/02/2017  . Chronic migraine w/o aura w/o status migrainosus, not intractable 04/06/2017  . Obesity 11/10/2016  . Vitamin D deficiency 11/10/2016  . Preventative health care 11/10/2016  . History of chicken pox   .  Allergy   . Diabetes mellitus type 2 in obese (HCC)   . Hyperlipidemia  Venetia Night Sukaina Toothaker, PT 05/12/20 11:52 AM   [facility] [address] [place], [place], [address] Phone: [phone]   Fax:  [phone]  Name: AMERIA SANJURJO MRN: [id number] Date of Birth: [birth year]

## 2020-05-22 ENCOUNTER — Other Ambulatory Visit: Payer: Self-pay | Admitting: Family Medicine

## 2020-05-22 MED FILL — SAXENDA 18 MG/3 ML PEN: 18 | 30 days supply | Qty: 15 | Fill #0

## 2020-05-26 ENCOUNTER — Ambulatory Visit: Payer: 59 | Admitting: Physical Therapy

## 2020-05-26 DIAGNOSIS — H524 Presbyopia: Secondary | ICD-10-CM | POA: Diagnosis not present

## 2020-05-26 DIAGNOSIS — E119 Type 2 diabetes mellitus without complications: Secondary | ICD-10-CM | POA: Diagnosis not present

## 2020-05-26 LAB — HM DIABETES EYE EXAM

## 2020-06-01 ENCOUNTER — Encounter: Payer: Self-pay | Admitting: *Deleted

## 2020-06-02 ENCOUNTER — Encounter: Payer: Self-pay | Admitting: Physical Therapy

## 2020-06-02 ENCOUNTER — Ambulatory Visit: Payer: 59 | Admitting: Physical Therapy

## 2020-06-02 ENCOUNTER — Other Ambulatory Visit: Payer: Self-pay

## 2020-06-02 DIAGNOSIS — R293 Abnormal posture: Secondary | ICD-10-CM | POA: Diagnosis not present

## 2020-06-02 DIAGNOSIS — R252 Cramp and spasm: Secondary | ICD-10-CM | POA: Diagnosis not present

## 2020-06-02 DIAGNOSIS — M545 Low back pain: Secondary | ICD-10-CM | POA: Diagnosis not present

## 2020-06-02 DIAGNOSIS — G8929 Other chronic pain: Secondary | ICD-10-CM | POA: Diagnosis not present

## 2020-06-02 DIAGNOSIS — M6281 Muscle weakness (generalized): Secondary | ICD-10-CM | POA: Diagnosis not present

## 2020-06-02 NOTE — Therapy (Signed)
[facility] [address], [place], [place], [address] Phone: [phone]   Fax:  [phone]  Physical Therapy Treatment  Patient Details  Name: Amanda James MRN: [id number] Date of Birth: 1971/09/14 Referring Provider (PT): Mosie Lukes, MD   Encounter Date: 06/02/2020   PT End of Session - 06/02/20 0938    Visit Number 13    Date for PT Re-Evaluation 05/26/20    Authorization Type [employer] [employer]    PT Start Time 0930    PT Stop Time 1013    PT Time Calculation (min) 43 min    Activity Tolerance Patient tolerated treatment well;No increased pain    Behavior During Therapy [facility] for tasks assessed/performed           Past Medical History:  Diagnosis Date   Allergy    Cervical cancer screening 05/02/2017   Depression    Depression with anxiety    Diabetes mellitus type 2 in obese [facility])    Diabetes mellitus without complication ([place])    History of chicken pox    Hyperlipidemia    Low back pain    Migraines    Obesity 11/10/2016    Past Surgical History:  Procedure Laterality Date   ABDOMINOPLASTY  2012   AUGMENTATION MAMMAPLASTY     maxilift with implants    BLADDER SUSPENSION  2016   LAPAROSCOPIC GASTRIC BANDING  2005   esophageal dilation. band still present but released   MASTOPEXY  2012    There were no vitals filed for this visit.   Subjective Assessment - 06/02/20 0933    Subjective I fell apart since I was last here.  My pain reached 4-5/10.  I've had to push things at work with our expansion with more standing and lifting/bending/overhead work.  I did use my HEP as a tool which helped me avoid full flare up and back giving out.    Limitations Standing    How long can you stand comfortably? 3-5 min now, has regressed    How long can you walk comfortably? 15 min    Currently in Pain? Yes    Pain Score 5     Pain Location Back    Pain Orientation Right;Left;Lower    Pain  Descriptors / Indicators Aching;Tightness    Pain Type Chronic pain    Pain Radiating Towards Rt buttock    Pain Onset More than a month ago    Pain Frequency Constant    Aggravating Factors  bending/lifting/overhead work, standing > 3-5 min    Pain Relieving Factors heat/stretching, sitting, walking short distances    Effect of Pain on Daily Activities stand in lines, run errands, garden, standing work tasks              [facility] PT Assessment - 06/02/20 0001      Assessment   Medical Diagnosis M54.5 (ICD-10-CM) - Low back pain, unspecified back pain laterality, unspecified chronicity, unspecified whether sciatica present    Referring Provider (PT) Mosie Lukes, MD    Onset Date/Surgical Date --   30 years ago   Hand Dominance Right    Next MD Visit as needed    Prior Therapy for shoulder at this facility      Observation/Other Assessments   Focus on Therapeutic Outcomes (FOTO)  49%, goal 43%, was previously down to 44% but increased pain with several weeks without PT      Single Leg Stance   Comments  fair balance bil, 7-8 sec before balance loss bil LEs      Posture/Postural Control   Postural Limitations Increased lumbar lordosis    Posture Comments Pt with improving abdominal strength to support/suspend lumbar spine      ROM / Strength   AROM / PROM / Strength Strength      Strength   Overall Strength Comments trunk 4+/5 all planes    Right Hip Flexion 4/5    Left Hip Flexion 4/5      Palpation   Spinal mobility poor extension Rt L5/S1 facet    SI assessment  Sacrum Rt rotated, Rt SI joint compressed    Palpation comment L5/S1 multifidus on Rt, Rt L5/S1 facet, Rt SI joint. Myofascial tension Rt>Lt thoracodorsal fascia                         [facility] Adult PT Treatment/Exercise - 06/02/20 0001      Lumbar Exercises: Aerobic   Recumbent Bike L3 x 8' with lumbar heat, PT present to review goals and symptoms      Lumbar Exercises: Seated   Hip  Flexion on Ball Limitations seated on dynadisc marching      Manual Therapy   Manual Therapy Joint mobilization;Myofascial release;Manual Traction    Joint Mobilization Rt L5/S1 PA, Lt sacral rotation mobs to Rt sacral border Gr III/IV    Soft tissue mobilization bil lumbar paraspinals    Myofascial Release Rt thoracodorsal fascia    Manual Traction prone sacral distraction, pelvic distraction Gr II/III            Trigger Point Dry Needling - 06/02/20 0001    Consent Given? Yes    Education Handout Provided Previously provided    Muscles Treated Back/Hip Lumbar multifidi    Dry Needling Comments bil L3-S1    Lumbar multifidi Response Twitch response elicited;Palpable increased muscle length                  PT Short Term Goals - 03/31/20 0937      PT SHORT TERM GOAL #1   Title Pt will be ind in initial HEP    Status Achieved      PT SHORT TERM GOAL #2   Title Pt will be able to demo diaphragmatic abdominal breathing pattern and deep core muscle recruitment.    Status Achieved      PT SHORT TERM GOAL #3   Title Pt will be able to demo proper standing posture and body mechanics with sit to stand and squats.    Status Achieved             PT Long Term Goals - 06/02/20 0936      PT LONG TERM GOAL #1   Title Pt will be ind in advanced HEP and understand how to safely progress    Baseline progressing lumbopelvic dynamic stabilization    Status On-going      PT LONG TERM GOAL #2   Title Pt will be able to demo Lt LE balance for 10+ sec with SI joint stability and glut medius activation.    Baseline some wobbles toward 7-8 sec, able to correct and reach 10 sec, needs more stability to achieve goal    Status On-going      PT LONG TERM GOAL #3   Title Pt will achieve at least 4+/5 LE strength in bil hips to improve functional tasks such as gardening and household tasks.  PT LONG TERM GOAL #4   Title Reduce FOTO to </= 43%    Baseline 49%    Status On-going       PT LONG TERM GOAL #5   Title Pt will be able to stand for at least 20 min with pain not to exceed 3/10 to improve pain with errands.    Baseline pain reaches 5/10 within 3-5 min of standing when pain at worst    Status On-going                 Plan - 06/02/20 1209    Clinical Impression Statement Pt was unable to schedule PT visits x 3 weeks and returned with increased pain.  Standing/bending/lifting demands at work increased and her pain reached 5/10.  FOTO score regressed from 44% to 49% limited today.  Tolerance of standing time before pain regressed from 8' to 3-5 min.  She feels relief doing a standing trunk flexion hang and with lumbar and LE stretching.  She is making progress toward LTGs with much improved LE strength and flexibility.  She has remaining weakness in bil hip flexors 4/5 and needs cueing reminders during prolonged standing or with dynamic tasks to engage her core for support and protection of spine.  She is compliant with HEP and has used tools to manage increased pain which she reported would have been much worse without.  PT discussed possiblity of exploring some beginner yoga classes online to address spinal myofascial stiffness.  Pt presented with limited Rt SI and Rt L5/S1 facet dysfunction today as well as TPs and myofascial tension of Rt>Lt thoracodorsal fascia.  Pt reported signif relief end of session with DN and manual techniques to address limited mobility of these regions.  Pt will continue to benefit from skilled PT as evident in her backslide without PT x 3 weeks.  PT recommends 1x/week with focus on manual techniques as long as Pt continues to be compliant with HEP.    Personal Factors and Comorbidities Time since onset of injury/illness/exacerbation    Examination-Activity Limitations Stand    Stability/Clinical Decision Making Stable/Uncomplicated    Rehab Potential Excellent    PT Frequency 1x / week    PT Duration 12 weeks   long cert period due to  clinic at capacity for next month, will be delayed re-start unless cancellations open up appointment slots   PT Treatment/Interventions ADLs/Self Care Home Management;Cryotherapy;Electrical Stimulation;Moist Heat;Traction;Functional mobility training;Therapeutic activities;Therapeutic exercise;Balance training;Neuromuscular re-education;Manual techniques;Patient/family education;Passive range of motion;Dry needling;Taping;Spinal Manipulations;Joint Manipulations    PT Next Visit Plan continue Rt SI and Rt lumbar mobs, try sidelying gapping Rt lumbar and MET for sacrum and ilium, DN lumbar, myofascial release    PT Home Exercise Plan Access Code: [email or web address]    Recommended Other Services re-eval on or before 08/25/20    Consulted and Agree with Plan of Care Patient           Patient will benefit from skilled therapeutic intervention in order to improve the following deficits and impairments:  Decreased coordination, Increased fascial restricitons, Increased muscle spasms, Decreased activity tolerance, Pain, Postural dysfunction, Decreased strength, Decreased balance, Hypomobility, Improper body mechanics  Visit Diagnosis: Chronic midline low back pain without sciatica - Plan: PT plan of care cert/re-cert  Abnormal posture - Plan: PT plan of care cert/re-cert  Muscle weakness (generalized) - Plan: PT plan of care cert/re-cert  Cramp and spasm - Plan: PT plan of care cert/re-cert     Problem List Patient Active Problem List  Diagnosis Date Noted   Recurrent UTI 12/26/2019   Shoulder pain, left 12/26/2019   Chronic bilateral low back pain without sciatica 06/28/2018   Cervical cancer screening 05/02/2017   Chronic migraine w/o aura w/o status migrainosus, not intractable 04/06/2017   Obesity 11/10/2016   Vitamin D deficiency 11/10/2016   Preventative health care 11/10/2016   History of chicken pox    Allergy    Diabetes mellitus type 2 in obese ([place])    Hyperlipidemia     Kee Drudge, PT 06/02/20 12:21 PM   [facility] [address], [place], [place], [address] Phone: [phone]   Fax:  [phone]  Name: Amanda James MRN: [id number] Date of Birth: [birth year]

## 2020-06-09 ENCOUNTER — Ambulatory Visit: Payer: 59 | Attending: Family Medicine | Admitting: Physical Therapy

## 2020-06-09 ENCOUNTER — Other Ambulatory Visit: Payer: Self-pay

## 2020-06-09 ENCOUNTER — Encounter: Payer: Self-pay | Admitting: Physical Therapy

## 2020-06-09 DIAGNOSIS — R252 Cramp and spasm: Secondary | ICD-10-CM | POA: Insufficient documentation

## 2020-06-09 DIAGNOSIS — M6281 Muscle weakness (generalized): Secondary | ICD-10-CM | POA: Insufficient documentation

## 2020-06-09 DIAGNOSIS — R293 Abnormal posture: Secondary | ICD-10-CM | POA: Diagnosis not present

## 2020-06-09 DIAGNOSIS — G8929 Other chronic pain: Secondary | ICD-10-CM | POA: Insufficient documentation

## 2020-06-09 DIAGNOSIS — M545 Low back pain: Secondary | ICD-10-CM | POA: Diagnosis not present

## 2020-06-09 NOTE — Therapy (Signed)
[facility] [address] [place], [place], [address] Phone: [phone]   Fax:  [phone]  Physical Therapy Treatment  Patient Details  Name: Amanda James MRN: [id number] Date of Birth: Mar 12, 1971 Referring Provider (PT): Mosie Lukes, MD   Encounter Date: 06/09/2020   PT End of Session - 06/09/20 0804    Visit Number 14    Authorization Type [employer]    PT Start Time 0802    PT Stop Time 5183    PT Time Calculation (min) 48 min    Activity Tolerance Patient tolerated treatment well;No increased pain    Behavior During Therapy [facility] for tasks assessed/performed           Past Medical History:  Diagnosis Date   Allergy    Cervical cancer screening 05/02/2017   Depression    Depression with anxiety    Diabetes mellitus type 2 in obese [facility])    Diabetes mellitus without complication ([place])    History of chicken pox    Hyperlipidemia    Low back pain    Migraines    Obesity 11/10/2016    Past Surgical History:  Procedure Laterality Date   ABDOMINOPLASTY  2012   AUGMENTATION MAMMAPLASTY     maxilift with implants    BLADDER SUSPENSION  2016   LAPAROSCOPIC GASTRIC BANDING  2005   esophageal dilation. band still present but released   MASTOPEXY  2012    There were no vitals filed for this visit.   Subjective Assessment - 06/09/20 0802    Subjective I was pretty beat up after last time but then felt much better through the week.    Limitations Standing    How long can you stand comfortably? 8-10 min    How long can you walk comfortably? 15 min    Currently in Pain? Yes    Pain Score 3     Pain Location Back    Pain Orientation Right;Left;Lower;Mid    Pain Descriptors / Indicators Aching;Tightness    Pain Type Chronic pain    Pain Onset More than a month ago    Pain Frequency Constant    Aggravating Factors  standing/bending/lifting    Pain Relieving Factors stretch, heat                              [facility] Adult PT Treatment/Exercise - 06/09/20 0001      Lumbar Exercises: Stretches   Other Lumbar Stretch Exercise Rt ext/rot trunk A/ROM x 3 reps, flexion/SB Lt with ball seated rollouts x 5      Manual Therapy   Manual Therapy Muscle Energy Technique    Joint Mobilization Rt L4-S1 gapping in Lt SL with spinal locking, long axis traction prone    Soft tissue mobilization Rt piriformis and gluteals    Myofascial Release Rt thoracodorsal fascia with seated ball rollout flexion/Lt SB over L4-S1    Muscle Energy Technique to correct Rt ilial inflare and Rt sacral rotation, to mob A/P rotation bil ilia (instructed for HEP)            Trigger Point Dry Needling - 06/09/20 0001    Consent Given? Yes    Education Handout Provided Previously provided    Muscles Treated Back/Hip Lumbar multifidi;Gluteus medius;Piriformis    Dry Needling Comments Rt hip, Rt L5/S1 and L4/5    Gluteus Medius Response Twitch response elicited;Palpable increased muscle length  Piriformis Response Twitch response elicited;Palpable increased muscle length    Lumbar multifidi Response Twitch response elicited;Palpable increased muscle length                  PT Short Term Goals - 03/31/20 0937      PT SHORT TERM GOAL #1   Title Pt will be ind in initial HEP    Status Achieved      PT SHORT TERM GOAL #2   Title Pt will be able to demo diaphragmatic abdominal breathing pattern and deep core muscle recruitment.    Status Achieved      PT SHORT TERM GOAL #3   Title Pt will be able to demo proper standing posture and body mechanics with sit to stand and squats.    Status Achieved             PT Long Term Goals - 06/02/20 0936      PT LONG TERM GOAL #1   Title Pt will be ind in advanced HEP and understand how to safely progress    Baseline progressing lumbopelvic dynamic stabilization    Status On-going      PT LONG TERM GOAL #2   Title Pt will  be able to demo Lt LE balance for 10+ sec with SI joint stability and glut medius activation.    Baseline some wobbles toward 7-8 sec, able to correct and reach 10 sec, needs more stability to achieve goal    Status On-going      PT LONG TERM GOAL #3   Title Pt will achieve at least 4+/5 LE strength in bil hips to improve functional tasks such as gardening and household tasks.      PT LONG TERM GOAL #4   Title Reduce FOTO to </= 43%    Baseline 49%    Status On-going      PT LONG TERM GOAL #5   Title Pt will be able to stand for at least 20 min with pain not to exceed 3/10 to improve pain with errands.    Baseline pain reaches 5/10 within 3-5 min of standing when pain at worst    Status On-going                 Plan - 06/09/20 0845    Clinical Impression Statement Pt reported improved pain and standing tolerance back to baseline after last week's appt.  Pt continues to demo asymmetry and restriction in Rt lumbar and SI region.  PT performed DN to Rt lumbar and lateral/posterior hip today with signif twitch/release.  Pt with underlying joint restrictions in Rt lumbar facets L4-S1 and Rt SI joint with Rt ilial inflare and Rt sacral Rot.  MET, stretching, DN and manual techniques allowed for more symmetry in pelvis and improved joint mobility end of session.  Heat used for soreness end of session.  Continue along POC with ongoing monitoring.    PT Frequency 1x / week    PT Duration 12 weeks    PT Treatment/Interventions ADLs/Self Care Home Management;Cryotherapy;Electrical Stimulation;Moist Heat;Traction;Functional mobility training;Therapeutic activities;Therapeutic exercise;Balance training;Neuromuscular re-education;Manual techniques;Patient/family education;Passive range of motion;Dry needling;Taping;Spinal Manipulations;Joint Manipulations    PT Next Visit Plan f/u on manual tech last viist, recheck Rt SI and facet mobs on Rt lumbar, MET as needed    PT Home Exercise Plan Access  Code: [email or web address]    Consulted and Agree with Plan of Care Patient           Patient will  benefit from skilled therapeutic intervention in order to improve the following deficits and impairments:     Visit Diagnosis: Chronic midline low back pain without sciatica  Abnormal posture  Muscle weakness (generalized)  Cramp and spasm     Problem List Patient Active Problem List   Diagnosis Date Noted   Recurrent UTI 12/26/2019   Shoulder pain, left 12/26/2019   Chronic bilateral low back pain without sciatica 06/28/2018   Cervical cancer screening 05/02/2017   Chronic migraine w/o aura w/o status migrainosus, not intractable 04/06/2017   Obesity 11/10/2016   Vitamin D deficiency 11/10/2016   Preventative health care 11/10/2016   History of chicken pox    Allergy    Diabetes mellitus type 2 in obese ([place])    Hyperlipidemia     Dakwan Pridgen, PT 06/09/20 8:48 AM   [facility] [address], [place], [place], [address] Phone: [phone]   Fax:  [phone]  Name: ERNESTINA JOE MRN: [id number] Date of Birth: 01/07/1971

## 2020-06-20 ENCOUNTER — Telehealth: Payer: Self-pay | Admitting: *Deleted

## 2020-06-20 MED ORDER — UNIFINE PENTIPS 32G X 4 MM MISC: 0 refills | Status: DC

## 2020-06-20 MED FILL — UNIFINE PENTIPS 32GX5/32: 32G X 4 MM | 90 days supply | Qty: 100 | Fill #0

## 2020-06-20 NOTE — Telephone Encounter (Signed)
Dr Abner Greenspan -- pt last seen by you 12/24/19 and has no future appts scheduled. When is she due for routine follow up? Received fax from [facility] requesting Unifine pentips 32G x 5/32" refill. 90 day supply sent.

## 2020-06-21 MED FILL — SAXENDA 18 MG/3 ML PEN: 18 | 30 days supply | Qty: 15 | Fill #1

## 2020-06-30 ENCOUNTER — Ambulatory Visit: Payer: 59 | Admitting: Physical Therapy

## 2020-07-03 ENCOUNTER — Other Ambulatory Visit (HOSPITAL_COMMUNITY)
Admission: RE | Admit: 2020-07-03 | Discharge: 2020-07-03 | Disposition: A | Discharge status: Home / Self Care | Payer: 59 | Source: Ambulatory Visit | Attending: Family Medicine | Admitting: Family Medicine

## 2020-07-03 ENCOUNTER — Ambulatory Visit (INDEPENDENT_AMBULATORY_CARE_PROVIDER_SITE_OTHER): Payer: 59 | Admitting: Family Medicine

## 2020-07-03 ENCOUNTER — Other Ambulatory Visit: Payer: Self-pay

## 2020-07-03 ENCOUNTER — Encounter: Payer: Self-pay | Admitting: Family Medicine

## 2020-07-03 VITALS — BP 110/70 | HR 69 | Temp 98.5°F | Resp 12 | Ht 66.0 in | Wt 222.4 lb

## 2020-07-03 DIAGNOSIS — E1169 Type 2 diabetes mellitus with other specified complication: Secondary | ICD-10-CM

## 2020-07-03 DIAGNOSIS — G43709 Chronic migraine without aura, not intractable, without status migrainosus: Secondary | ICD-10-CM

## 2020-07-03 DIAGNOSIS — Z124 Encounter for screening for malignant neoplasm of cervix: Secondary | ICD-10-CM

## 2020-07-03 DIAGNOSIS — M545 Low back pain, unspecified: Secondary | ICD-10-CM

## 2020-07-03 DIAGNOSIS — E559 Vitamin D deficiency, unspecified: Secondary | ICD-10-CM

## 2020-07-03 DIAGNOSIS — Z1211 Encounter for screening for malignant neoplasm of colon: Secondary | ICD-10-CM | POA: Diagnosis not present

## 2020-07-03 DIAGNOSIS — E782 Mixed hyperlipidemia: Secondary | ICD-10-CM | POA: Diagnosis not present

## 2020-07-03 DIAGNOSIS — Z0000 Encounter for general adult medical examination without abnormal findings: Secondary | ICD-10-CM

## 2020-07-03 DIAGNOSIS — N390 Urinary tract infection, site not specified: Secondary | ICD-10-CM | POA: Diagnosis not present

## 2020-07-03 DIAGNOSIS — Z1231 Encounter for screening mammogram for malignant neoplasm of breast: Secondary | ICD-10-CM

## 2020-07-03 DIAGNOSIS — G8929 Other chronic pain: Secondary | ICD-10-CM

## 2020-07-03 DIAGNOSIS — E669 Obesity, unspecified: Secondary | ICD-10-CM

## 2020-07-03 MED ORDER — WEGOVY 2.4 MG/0.75ML ~~LOC~~ SOAJ: 2.4000 mg | SUBCUTANEOUS | 5 refills | Status: DC

## 2020-07-03 NOTE — Assessment & Plan Note (Addendum)
Patient encouraged to maintain heart healthy diet, regular exercise, adequate sleep. Consider daily probiotics. Take medications as prescribed. Labs ordered and reviewed 

## 2020-07-03 NOTE — Assessment & Plan Note (Signed)
Supplement and monitor 

## 2020-07-03 NOTE — Assessment & Plan Note (Signed)
hgba1c acceptable, minimize simple carbs. Increase exercise as tolerated. Continue current meds 

## 2020-07-03 NOTE — Assessment & Plan Note (Signed)
No symptoms recently

## 2020-07-03 NOTE — Assessment & Plan Note (Signed)
Pap today, no concerns on exam. Mostly still regular occasionally light and irregular

## 2020-07-03 NOTE — Assessment & Plan Note (Addendum)
Encouraged heart healthy diet, increase exercise, avoid trans fats, consider a krill oil cap daily. Tolerating Atorvastatin 

## 2020-07-03 NOTE — Assessment & Plan Note (Signed)
Follows with Dr Lucia Gaskins Botox, Ajovy and Neurtec prn has been helpful, doing well

## 2020-07-03 NOTE — Assessment & Plan Note (Signed)
Getting good relief with PT and dry needling.

## 2020-07-03 NOTE — Patient Instructions (Signed)

## 2020-07-03 NOTE — Progress Notes (Signed)
Subjective:    Patient ID: Amanda James, female    DOB: 1971/06/12, 49 y.o.   MRN: [id number]  Chief Complaint  Patient presents with  . Annual Exam    HPI Patient is in today for annual preventative exam and follow up on chronic medical concerns. No recent febrile illness or hospitalizations. No polyuria or polydipsia. She is trying to eat well and she is exercising. She is undergoing physical therapy and dry needling and that is helping her back pain. Sugars are well controlled and overall she feels well. Denies CP/palp/SOB/HA/congestion/fevers/GI or GU c/o. Taking meds as prescribed  Past Medical History:  Diagnosis Date  . Allergy   . Cervical cancer screening 05/02/2017  . Depression   . Depression with anxiety   . Diabetes mellitus type 2 in obese ([place])   . Diabetes mellitus without complication ([place])   . History of chicken pox   . Hyperlipidemia   . Low back pain   . Migraines   . Obesity 11/10/2016    Past Surgical History:  Procedure Laterality Date  . ABDOMINOPLASTY  2012  . AUGMENTATION MAMMAPLASTY     maxilift with implants   . BLADDER SUSPENSION  2016  . LAPAROSCOPIC GASTRIC BANDING  2005   esophageal dilation. band still present but released  . MASTOPEXY  2012    Family History  Problem Relation Age of Onset  . Diabetes Mother   . Hypertension Mother   . Hyperlipidemia Mother   . Obesity Mother   . Heart disease Mother        pacer  . Diabetes Father   . Heart disease Father        MI  . Hyperlipidemia Father   . Hypertension Father   . Obesity Father   . Diabetes Sister   . Hyperlipidemia Sister   . Hypertension Sister   . Obesity Sister   . Obesity Maternal Grandmother   . Hypertension Maternal Grandmother   . Hyperlipidemia Maternal Grandmother   . Diabetes Maternal Grandmother   . Rosacea Maternal Grandmother   . Non-Hodgkin's lymphoma Maternal Grandmother   . Arthritis Maternal Grandmother        spinal stenosis  . Multiple myeloma  Maternal Grandfather   . Hyperlipidemia Paternal Grandmother        rhabdo from statins  . Kidney disease Paternal Grandmother   . Migraines Neg Hx     Social History   Socioeconomic History  . Marital status: Married    Spouse name: Not on file  . Number of children: 1  . Years of education: MD  . Highest education level: Not on file  Occupational History  . Occupation:   Tobacco Use  . Smoking status: Never Smoker  . Smokeless tobacco: Never Used  Substance and Sexual Activity  . Alcohol use: Yes    Alcohol/week: 5.0 standard drinks    Types: 5 Glasses of wine per week    Comment: 3-5 glasses of wine per week  . Drug use: No  . Sexual activity: Not on file  Other Topics Concern  . Not on file  Social History Narrative   Works With [employer], lives with husband, 3 cats   No major dietary restrictions. Exercise 30 minutes 3 x a week   Seat belts routinely   Right-handed   Caffeine: 1-2 cups of coffee per day   Social Determinants of Health   Financial Resource Strain:   . Difficulty of Paying Living Expenses:  Food Insecurity:   . Worried About Charity fundraiser in the Last Year:   . Arboriculturist in the Last Year:   Transportation Needs:   . Film/video editor (Medical):   [name] Lack of Transportation (Non-Medical):   Physical Activity:   . Days of Exercise per Week:   . Minutes of Exercise per Session:   Stress:   . Feeling of Stress :   Social Connections:   . Frequency of Communication with Friends and Family:   . Frequency of Social Gatherings with Friends and Family:   . Attends Religious Services:   . Active Member of Clubs or Organizations:   . Attends Archivist Meetings:   [name] Marital Status:   Intimate Partner Violence:   . Fear of Current or Ex-Partner:   . Emotionally Abused:   [name] Physically Abused:   . Sexually Abused:     Outpatient Medications Prior to Visit  Medication Sig Dispense Refill  . atorvastatin (LIPITOR)  10 MG tablet Take 1 tablet (10 mg total) by mouth daily. 90 tablet 1  . Cetirizine HCl (ZYRTEC ALLERGY) 10 MG CAPS Take 1 capsule by mouth daily.    . cyclobenzaprine (FLEXERIL) 10 MG tablet TAKE 1 TABLET (10 MG TOTAL) BY MOUTH 3 (THREE) TIMES DAILY AS NEEDED FOR MUSCLE SPASMS. 30 tablet 2  . Erenumab-aooe (AIMOVIG) 140 MG/ML SOAJ Inject 140 mg into the skin every 30 (thirty) days. 1 pen 5  . escitalopram (LEXAPRO) 20 MG tablet Take 1 tablet (20 mg total) by mouth daily. 90 tablet 1  . fluticasone (FLONASE) 50 MCG/ACT nasal spray Place 1 spray into both nostrils 2 (two) times daily.    . Insulin Pen Needle (UNIFINE PENTIPS) 32G X 4 MM MISC USE AS DIRECTED WITH VICTOZA 100 each 0  . nitrofurantoin, macrocrystal-monohydrate, (MACROBID) 100 MG capsule Take 1 capsule (100 mg total) by mouth 2 (two) times daily as needed. 20 capsule 1  . tretinoin (RETIN-A) 0.025 % cream Apply topically at bedtime. To affected areas 45 g 5  . SAXENDA 18 MG/3ML SOPN INJECT 3 MG INTO THE SKIN DAILY. 15 mL 5   No facility-administered medications prior to visit.    Allergies  Allergen Reactions  . Erythromycin Diarrhea    Review of Systems  Constitutional: Negative for fever and malaise/fatigue.  HENT: Negative for congestion.   Eyes: Negative for blurred vision.  Respiratory: Negative for shortness of breath.   Cardiovascular: Negative for chest pain, palpitations and leg swelling.  Gastrointestinal: Negative for abdominal pain, blood in stool and nausea.  Genitourinary: Negative for dysuria and frequency.  Musculoskeletal: Positive for back pain. Negative for falls.  Skin: Negative for rash.  Neurological: Negative for dizziness, loss of consciousness and headaches.  Endo/Heme/Allergies: Negative for environmental allergies.  Psychiatric/Behavioral: Negative for depression. The patient is not nervous/anxious.        Objective:    Physical Exam Constitutional:      General: She is not in acute  distress.    Appearance: She is not diaphoretic.  HENT:     Head: Normocephalic and atraumatic.     Right Ear: External ear normal.     Left Ear: External ear normal.     Nose: Nose normal.     Mouth/Throat:     Pharynx: No oropharyngeal exudate.  Eyes:     General: No scleral icterus.       Right eye: No discharge.  Left eye: No discharge.     Conjunctiva/sclera: Conjunctivae normal.     Pupils: Pupils are equal, round, and reactive to light.  Neck:     Thyroid: No thyromegaly.  Cardiovascular:     Rate and Rhythm: Normal rate and regular rhythm.     Heart sounds: Normal heart sounds. No murmur heard.   Pulmonary:     Effort: Pulmonary effort is normal. No respiratory distress.     Breath sounds: Normal breath sounds. No wheezing or rales.  Abdominal:     General: Bowel sounds are normal. There is no distension.     Palpations: Abdomen is soft. There is no mass.     Tenderness: There is no abdominal tenderness.  Genitourinary:    General: Normal vulva.     Vagina: No vaginal discharge.     Rectum: Normal.     Comments: No breast lesions masses or discharge Musculoskeletal:        General: No tenderness. Normal range of motion.     Cervical back: Normal range of motion and neck supple.  Lymphadenopathy:     Cervical: No cervical adenopathy.  Skin:    General: Skin is warm and dry.     Findings: No rash.  Neurological:     Mental Status: She is alert and oriented to person, place, and time.     Cranial Nerves: No cranial nerve deficit.     Coordination: Coordination normal.     Deep Tendon Reflexes: Reflexes are normal and symmetric. Reflexes normal.     BP 110/70 (BP Location: Left Arm, Cuff Size: Large)   Pulse 69   Temp 98.5 F (36.9 C) (Oral)   Resp 12   Ht '5\' 6"'  (1.676 m)   Wt (!) 222 lb 6.4 oz (100.9 kg)   SpO2 98%   BMI 35.90 kg/m  Wt Readings from Last 3 Encounters:  07/03/20 (!) 222 lb 6.4 oz (100.9 kg)  12/24/19 217 lb (98.4 kg)  11/26/19  224 lb 6.4 oz (101.8 kg)    Diabetic Foot Exam - Simple   No data filed     Lab Results  Component Value Date   WBC 8.5 09/10/2019   HGB 13.4 09/10/2019   HCT 40.5 09/10/2019   PLT 294.0 09/10/2019   GLUCOSE 89 09/10/2019   CHOL 166 09/10/2019   TRIG 92.0 09/10/2019   HDL 52.80 09/10/2019   LDLCALC 95 09/10/2019   ALT 18 09/10/2019   AST 17 09/10/2019   NA 137 09/10/2019   K 4.8 09/10/2019   CL 103 09/10/2019   CREATININE 0.82 09/10/2019   BUN 14 09/10/2019   CO2 25 09/10/2019   TSH 2.73 09/10/2019   INR 1.04 09/17/2016   HGBA1C 5.5 09/10/2019   MICROALBUR <0.7 09/10/2019    Lab Results  Component Value Date   TSH 2.73 09/10/2019   Lab Results  Component Value Date   WBC 8.5 09/10/2019   HGB 13.4 09/10/2019   HCT 40.5 09/10/2019   MCV 96.8 09/10/2019   PLT 294.0 09/10/2019   Lab Results  Component Value Date   NA 137 09/10/2019   K 4.8 09/10/2019   CO2 25 09/10/2019   GLUCOSE 89 09/10/2019   BUN 14 09/10/2019   CREATININE 0.82 09/10/2019   BILITOT 0.8 09/10/2019   ALKPHOS 80 09/10/2019   AST 17 09/10/2019   ALT 18 09/10/2019   PROT 7.2 09/10/2019   ALBUMIN 4.3 09/10/2019   CALCIUM 9.4 09/10/2019   ANIONGAP 5  09/17/2016   GFR 74.06 09/10/2019   Lab Results  Component Value Date   CHOL 166 09/10/2019   Lab Results  Component Value Date   HDL 52.80 09/10/2019   Lab Results  Component Value Date   LDLCALC 95 09/10/2019   Lab Results  Component Value Date   TRIG 92.0 09/10/2019   Lab Results  Component Value Date   CHOLHDL 3 09/10/2019   Lab Results  Component Value Date   HGBA1C 5.5 09/10/2019       Assessment & Plan:   Problem List Items Addressed This Visit    Diabetes mellitus type 2 in obese ([place])    hgba1c acceptable, minimize simple carbs. Increase exercise as tolerated. Continue current meds      Hyperlipidemia    Encouraged heart healthy diet, increase exercise, avoid trans fats, consider a krill oil cap daily.  Tolerating Atorvastatin      Vitamin D deficiency    Supplement and monitor      Preventative health care    Patient encouraged to maintain heart healthy diet, regular exercise, adequate sleep. Consider daily probiotics. Take medications as prescribed. Labs ordered and reviewed.       Chronic migraine w/o aura w/o status migrainosus, not intractable    Follows with Dr Jaynee Eagles Botox, Ajovy and Neurtec prn has been helpful, doing well      Cervical cancer screening    Pap today, no concerns on exam. Mostly still regular occasionally light and irregular      Relevant Orders   Cytology - PAP( [place])   Chronic bilateral low back pain without sciatica    Getting good relief with PT and dry needling.       Recurrent UTI    No symptoms recently       Other Visit Diagnoses    Encounter for screening mammogram for malignant neoplasm of breast    -  Primary   Relevant Orders   MM 3D SCREEN BREAST BILATERAL   Colon cancer screening       Relevant Orders   Ambulatory referral to Gastroenterology      I have discontinued Dr. Starlyn Skeans "Dr. Juanda Bond. I am also having her start on Wegovy. Additionally, I am having her maintain her fluticasone, Cetirizine HCl, Erenumab-aooe, tretinoin, cyclobenzaprine, nitrofurantoin (macrocrystal-monohydrate), atorvastatin, escitalopram, and Unifine Pentips.  Meds ordered this encounter  Medications  . Semaglutide-Weight Management (WEGOVY) 2.4 MG/0.75ML SOAJ    Sig: Inject 2.4 mg into the skin once a week.    Dispense:  3.3 mL    Refill:  5     Penni Homans, MD

## 2020-07-05 MED FILL — WEGOVY 2.4 MG/0.75ML SOAJ: 2.4 | 28 days supply | Qty: 3 | Fill #0

## 2020-07-05 MED FILL — WEGOVY 2.4 MG/0.75ML SOAJ: 2.4 | 28 days supply | Qty: 3 | Fill #0 | Status: TO

## 2020-07-06 LAB — CYTOLOGY - PAP
Comment: NEGATIVE
Diagnosis: NEGATIVE
High risk HPV: NEGATIVE

## 2020-07-07 ENCOUNTER — Telehealth: Payer: 59 | Admitting: Physician Assistant

## 2020-07-07 ENCOUNTER — Other Ambulatory Visit: Payer: Self-pay

## 2020-07-07 ENCOUNTER — Encounter: Payer: Self-pay | Admitting: Physical Therapy

## 2020-07-07 ENCOUNTER — Ambulatory Visit: Payer: 59 | Admitting: Physical Therapy

## 2020-07-07 DIAGNOSIS — G8929 Other chronic pain: Secondary | ICD-10-CM

## 2020-07-07 DIAGNOSIS — R293 Abnormal posture: Secondary | ICD-10-CM

## 2020-07-07 DIAGNOSIS — M545 Low back pain: Secondary | ICD-10-CM | POA: Diagnosis not present

## 2020-07-07 DIAGNOSIS — L03119 Cellulitis of unspecified part of limb: Secondary | ICD-10-CM | POA: Diagnosis not present

## 2020-07-07 DIAGNOSIS — M6281 Muscle weakness (generalized): Secondary | ICD-10-CM

## 2020-07-07 DIAGNOSIS — R252 Cramp and spasm: Secondary | ICD-10-CM | POA: Diagnosis not present

## 2020-07-07 DIAGNOSIS — W5501XA Bitten by cat, initial encounter: Secondary | ICD-10-CM | POA: Diagnosis not present

## 2020-07-07 MED ORDER — AMOXICILLIN-POT CLAVULANATE 875-125 MG PO TABS: 1.0000 | ORAL_TABLET | Freq: Two times a day (BID) | ORAL | 0 refills | Status: AC

## 2020-07-07 NOTE — Therapy (Signed)
[facility] [address] [place], [place], [address] Phone: [phone]   Fax:  [phone]  Physical Therapy Treatment  Patient Details  Name: Amanda James MRN: [id number] Date of Birth: 1971-09-20 Referring Provider (PT): Bradd Canary, MD   Encounter Date: 07/07/2020   PT End of Session - 07/07/20 1107    Visit Number 15    Authorization Type [employer]    PT Start Time 1105    PT Stop Time 1146    PT Time Calculation (min) 41 min    Activity Tolerance Patient tolerated treatment well;No increased pain    Behavior During Therapy [facility] for tasks assessed/performed           Past Medical History:  Diagnosis Date   Allergy    Cervical cancer screening 05/02/2017   Depression    Depression with anxiety    Diabetes mellitus type 2 in obese [facility])    Diabetes mellitus without complication (HCC)    History of chicken pox    Hyperlipidemia    Low back pain    Migraines    Obesity 11/10/2016    Past Surgical History:  Procedure Laterality Date   ABDOMINOPLASTY  2012   AUGMENTATION MAMMAPLASTY     maxilift with implants    BLADDER SUSPENSION  2016   LAPAROSCOPIC GASTRIC BANDING  2005   esophageal dilation. band still present but released   MASTOPEXY  2012    There were no vitals filed for this visit.   Subjective Assessment - 07/07/20 1108    Subjective I haven't been pushing activity so have felt pretty good. Standing is still a problem.    Limitations Standing    How long can you stand comfortably? 8-10 min    How long can you walk comfortably? 15 min    Currently in Pain? Yes    Pain Score 2     Pain Location Back    Pain Orientation Right;Left;Mid    Pain Descriptors / Indicators Aching;Tightness    Pain Type Chronic pain    Pain Onset More than a month ago    Pain Frequency Intermittent    Aggravating Factors  standing    Effect of Pain on Daily Activities stand in  lines, work, run errands                             [facility] PT Treatment/Exercise - 07/07/20 0001      Exercises   Exercises Lumbar;Knee/Hip      Lumbar Exercises: Aerobic   Recumbent Bike L3 x 5', PT present to review status      Knee/Hip Exercises: Standing   Other Standing Knee Exercises warrior 1 with overhead reach (overhead reach w/ SB did not add further stretch)      Manual Therapy   Manual Therapy Soft tissue mobilization;Myofascial release;Joint mobilization    Joint Mobilization UPAs Rt L5/S1 facet, PAs T/L jxn    Soft tissue mobilization Rt lumbar paraspinals, bil L4-S1 multifiidi, Rt QL    Myofascial Release skin rolling lumbar region x 15 passes top to bottom            Trigger Point Dry Needling - 07/07/20 0001    Consent Given? Yes    Muscles Treated Back/Hip Lumbar multifidi    Dry Needling Comments Rt L5/S1 bil    Lumbar multifidi Response Twitch response elicited;Palpable increased muscle length  PT Short Term Goals - 03/31/20 0981      PT SHORT TERM GOAL #1   Title Pt will be ind in initial HEP    Status Achieved      PT SHORT TERM GOAL #2   Title Pt will be able to demo diaphragmatic abdominal breathing pattern and deep core muscle recruitment.    Status Achieved      PT SHORT TERM GOAL #3   Title Pt will be able to demo proper standing posture and body mechanics with sit to stand and squats.    Status Achieved             PT Long Term Goals - 06/02/20 0936      PT LONG TERM GOAL #1   Title Pt will be ind in advanced HEP and understand how to safely progress    Baseline progressing lumbopelvic dynamic stabilization    Status On-going      PT LONG TERM GOAL #2   Title Pt will be able to demo Lt LE balance for 10+ sec with SI joint stability and glut medius activation.    Baseline some wobbles toward 7-8 sec, able to correct and reach 10 sec, needs more stability to achieve goal    Status  On-going      PT LONG TERM GOAL #3   Title Pt will achieve at least 4+/5 LE strength in bil hips to improve functional tasks such as gardening and household tasks.      PT LONG TERM GOAL #4   Title Reduce FOTO to </= 43%    Baseline 49%    Status On-going      PT LONG TERM GOAL #5   Title Pt will be able to stand for at least 20 min with pain not to exceed 3/10 to improve pain with errands.    Baseline pain reaches 5/10 within 3-5 min of standing when pain at worst    Status On-going                 Plan - 07/07/20 1151    Clinical Impression Statement Pt with less pain since last visit due to less physical demands.  She does still have ongoing lumbar pain with static standing, limiting her to approx 10 min max before increased pain.  She has localized stiffness in spine at L5/S1 Rt>Lt with poor L5/S1 facet mobility on Rt.  Myofascial and soft tissue restrictions are present throughout lumbar region.  She responds very well to DN during which Rt>Lt L5/S1 multifidus spasms before release.  PT discussed pattern of arthritis and a potential benefit of seeing MD for imaging to better understand current state of spinal joints.  Pt will continue to benefit from PT along POC with focus on manual techniques for improved mobilty of joints and soft tissues.    PT Frequency 1x / week    PT Duration 12 weeks    PT Treatment/Interventions ADLs/Self Care Home Management;Cryotherapy;Electrical Stimulation;Moist Heat;Traction;Functional mobility training;Therapeutic activities;Therapeutic exercise;Balance training;Neuromuscular re-education;Manual techniques;Patient/family education;Passive range of motion;Dry needling;Taping;Spinal Manipulations;Joint Manipulations    PT Next Visit Plan continue Rt L5/S1 facet mobs, STM, spinal ROM to improve lumbar ext and myofascial stretching    PT Home Exercise Plan Access Code: [email or web address]    Consulted and Agree with Plan of Care Patient           Patient  will benefit from skilled therapeutic intervention in order to improve the following deficits and impairments:  Visit Diagnosis: Chronic midline low back pain without sciatica  Abnormal posture  Muscle weakness (generalized)  Cramp and spasm     Problem List Patient Active Problem List   Diagnosis Date Noted   Recurrent UTI 12/26/2019   Shoulder pain, left 12/26/2019   Chronic bilateral low back pain without sciatica 06/28/2018   Cervical cancer screening 05/02/2017   Chronic migraine w/o aura w/o status migrainosus, not intractable 04/06/2017   Obesity 11/10/2016   Vitamin D deficiency 11/10/2016   Preventative health care 11/10/2016   History of chicken pox    Allergy    Diabetes mellitus type 2 in obese (HCC)    Hyperlipidemia     Tylar Amborn, PT 07/07/20 11:55 AM   [facility] [address] [place], [place], [address] Phone: [phone]   Fax:  [phone]  Name: ISABELLAH SOBOCINSKI MRN: [id number] Date of Birth: February 23, 1971

## 2020-07-07 NOTE — Progress Notes (Signed)
E Visit for Cat Bite  Hi Bryttani,   We are sorry that you are not feeling well. Here is how we plan to help! Looks like you have cellulitis secondary to a cat bite.  I have prescribed a course of antibiotics for the next two weeks.  Take this as directed and do not miss a dose. Keep the area clean and dry.  Sounds like you are up to date on your Tetanus vaccination, so that is great.    If you are not improving or your condition worsens within 48 hours of starting the antibiotic, please proceed to one of our Urgent Care locations.   Deep puncture wounds are of particular concern because cats have long, slender, sharp teeth. When the hand is bitten, bacteria can get into the tissue that surrounds the bones or into a joint and result in osteomyelitis (infection of the bone) or septic arthritis (infection of the joint). If infection occurs, it generally progresses rapidly, causing skin redness, swelling, and intense pain as quickly as 12 to 24 hours after the bite.  Based on what you shared with me it looks like you have cellulitis.  Cellulitis looks like areas of skin redness, swelling, and warmth; it develops as a result of bacteria entering under the skin. Little red spots and/or bleeding can be seen in skin, and tiny surface sacs containing fluid can occur. Fever can be present.   HOME CARE:  . Take your medications as ordered and take all of them, even if the skin irritation appears to be healing.   GET HELP RIGHT AWAY IF:  . Symptoms that don't begin to go away within 48 hours. . Severe redness persists or worsens . If the area turns color, spreads or swells. . If it blisters and opens, develops yellow-brown crust or bleeds. . You develop a fever or chills. . If the pain increases or becomes unbearable.  . Are unable to keep fluids and food down.  MAKE SURE YOU    Understand these instructions.  Will watch your condition.  Will get help right away if you are not doing well or  get worse.  Thank you for choosing an e-visit. Your e-visit answers were reviewed by a board certified advanced clinical practitioner to complete your personal care plan. Depending upon the condition, your plan could have included both over the counter or prescription medications. Please review your pharmacy choice. Make sure the pharmacy is open so you can pick up prescription now. If there is a problem, you may contact your provider through [employer] and have the prescription routed to another pharmacy. Your safety is important to [place]. If you have drug allergies check your prescription carefully.  For the next 24 hours you can use MyChart to ask questions about today's visit, request a non-urgent call back, or ask for a work or school excuse. You will get an email in the next two days asking about your experience. I hope that your e-visit has been valuable and will speed your recovery.  Greater than 5 minutes, yet less than 10 minutes of time have been spent researching, coordinating and implementing care for this patient today.

## 2020-07-10 ENCOUNTER — Telehealth: Payer: Self-pay | Admitting: *Deleted

## 2020-07-10 NOTE — Telephone Encounter (Signed)
Pt has lab appt scheduled for 07/21/20 and I do not see any active future orders in Epic.  Please place orders if appropriate or call pt if lab appt is no longer needed.

## 2020-07-10 NOTE — Telephone Encounter (Signed)
We sent a prior auth on [employer] ([id number]) and it was denied:  We denied this request based on the exclusions section of your health plan benefit  documents.  Covered alternatives may include Qsymia, which also requires a prior authorization. Please  work with your provider to discuss other treatment options.

## 2020-07-11 ENCOUNTER — Other Ambulatory Visit: Payer: Self-pay | Admitting: Family Medicine

## 2020-07-11 DIAGNOSIS — E559 Vitamin D deficiency, unspecified: Secondary | ICD-10-CM

## 2020-07-11 DIAGNOSIS — G8929 Other chronic pain: Secondary | ICD-10-CM

## 2020-07-11 DIAGNOSIS — M545 Low back pain, unspecified: Secondary | ICD-10-CM

## 2020-07-11 DIAGNOSIS — E1169 Type 2 diabetes mellitus with other specified complication: Secondary | ICD-10-CM

## 2020-07-11 DIAGNOSIS — E669 Obesity, unspecified: Secondary | ICD-10-CM

## 2020-07-11 DIAGNOSIS — E782 Mixed hyperlipidemia: Secondary | ICD-10-CM

## 2020-07-11 NOTE — Telephone Encounter (Signed)
Let her know her insurance turned it down and check what she wants to do. I think she knows how to get it paid for by the company. See what else she wants [place] to do.

## 2020-07-11 NOTE — Telephone Encounter (Signed)
Ordered

## 2020-07-11 NOTE — Telephone Encounter (Signed)
Left message on machine to call back  

## 2020-07-12 NOTE — Telephone Encounter (Signed)
Left message on machine to call back  

## 2020-07-14 ENCOUNTER — Other Ambulatory Visit: Payer: Self-pay

## 2020-07-14 ENCOUNTER — Encounter: Payer: Self-pay | Admitting: Physical Therapy

## 2020-07-14 ENCOUNTER — Other Ambulatory Visit (HOSPITAL_COMMUNITY): Payer: Self-pay

## 2020-07-14 ENCOUNTER — Ambulatory Visit: Payer: 59 | Attending: Family Medicine | Admitting: Physical Therapy

## 2020-07-14 DIAGNOSIS — M545 Low back pain: Secondary | ICD-10-CM | POA: Insufficient documentation

## 2020-07-14 DIAGNOSIS — R252 Cramp and spasm: Secondary | ICD-10-CM | POA: Insufficient documentation

## 2020-07-14 DIAGNOSIS — G8929 Other chronic pain: Secondary | ICD-10-CM | POA: Diagnosis not present

## 2020-07-14 DIAGNOSIS — M6281 Muscle weakness (generalized): Secondary | ICD-10-CM | POA: Diagnosis not present

## 2020-07-14 DIAGNOSIS — R293 Abnormal posture: Secondary | ICD-10-CM | POA: Insufficient documentation

## 2020-07-14 MED FILL — SODIUM FLUORIDE 5000 SENSIT: 1.1-5 | 30 days supply | Qty: 100 | Fill #0

## 2020-07-14 NOTE — Therapy (Addendum)
[facility] [address] [place], [place], [address] Phone: [phone]   Fax:  [phone]  Physical Therapy Treatment  Patient Details  Name: Amanda James MRN: [id number] Date of Birth: [birth year] Referring Provider (PT): Mosie Lukes, MD   Encounter Date: 07/14/2020   PT End of Session - 07/14/20 0757    Visit Number 16    Authorization Type [employer]    PT Start Time 8413    PT Stop Time 0843    PT Time Calculation (min) 45 min    Activity Tolerance Patient tolerated treatment well;No increased pain    Behavior During Therapy WFL for tasks assessed/performed           Past Medical History:  Diagnosis Date  . Allergy   . Cervical cancer screening 05/02/2017  . Depression   . Depression with anxiety   . Diabetes mellitus type 2 in obese ([place])   . Diabetes mellitus without complication ([place])   . History of chicken pox   . Hyperlipidemia   . Low back pain   . Migraines   . Obesity 11/10/2016    Past Surgical History:  Procedure Laterality Date  . ABDOMINOPLASTY  2012  . AUGMENTATION MAMMAPLASTY     maxilift with implants   . BLADDER SUSPENSION  2016  . LAPAROSCOPIC GASTRIC BANDING  2005   esophageal dilation. band still present but released  . MASTOPEXY  2012    There were no vitals filed for this visit.   Subjective Assessment - 07/14/20 0801    Subjective More achey this week.    Limitations Standing    How long can you stand comfortably? 8-10 min    How long can you walk comfortably? 15 min    Diagnostic tests none yet    Currently in Pain? Yes    Pain Score 3     Pain Location Back    Pain Orientation Lower;Mid    Pain Descriptors / Indicators Aching;Tightness    Pain Onset More than a month ago    Pain Frequency Intermittent    Aggravating Factors  standing    Pain Relieving Factors stretch, heat, PT    Effect of Pain on Daily Activities stand in lines, work, run errands                              [employer] [facility] Adult PT Treatment/Exercise - 07/14/20 0001      Exercises   Exercises Lumbar;Knee/Hip      Lumbar Exercises: Stretches   Single Knee to Chest Stretch Left;Right;1 rep;20 seconds    Single Knee to Chest Stretch Limitations foam roller under pelvis    Double Knee to Chest Stretch 1 rep;20 seconds    Double Knee to Chest Stretch Limitations foam roller under pelvis      Lumbar Exercises: Aerobic   Recumbent Bike L3 x 5' with lumbar heat, PT present to discuss POC and response to treatment      Lumbar Exercises: Quadruped   Madcat/Old Horse 10 reps    Other Quadruped Lumbar Exercises child's pose 1x30 straight, Rt, Lt each      Knee/Hip Exercises: Stretches   Hip Flexor Stretch Right;Left;1 rep;30 seconds    Hip Flexor Stretch Limitations foot on 2nd step      Manual Therapy   Manual Therapy Joint mobilization;Myofascial release;Soft tissue mobilization    Joint Mobilization Thoracic PAs and ribspringing  Lt mid-thoracic, Rt L5/S1 UPAs Gr III/IV    Soft tissue mobilization thoracic and lumbar paraspinal broadening and stripping bil    Myofascial Release thoracodorsal fascia bil    Manual Traction prone segmental manual traction Gr III/IV            Trigger Point Dry Needling - 07/14/20 0001    Consent Given? Yes    Education Handout Provided Previously provided    Muscles Treated Back/Hip Lumbar multifidi    Dry Needling Comments bil L5/S1    Lumbar multifidi Response Twitch response elicited;Palpable increased muscle length                  PT Short Term Goals - 03/31/20 0937      PT SHORT TERM GOAL #1   Title Pt will be ind in initial HEP    Status Achieved      PT SHORT TERM GOAL #2   Title Pt will be able to demo diaphragmatic abdominal breathing pattern and deep core muscle recruitment.    Status Achieved      PT SHORT TERM GOAL #3   Title Pt will be able to demo proper standing posture and body  mechanics with sit to stand and squats.    Status Achieved             PT Long Term Goals - 06/02/20 0936      PT LONG TERM GOAL #1   Title Pt will be ind in advanced HEP and understand how to safely progress    Baseline progressing lumbopelvic dynamic stabilization    Status On-going      PT LONG TERM GOAL #2   Title Pt will be able to demo Lt LE balance for 10+ sec with SI joint stability and glut medius activation.    Baseline some wobbles toward 7-8 sec, able to correct and reach 10 sec, needs more stability to achieve goal    Status On-going      PT LONG TERM GOAL #3   Title Pt will achieve at least 4+/5 LE strength in bil hips to improve functional tasks such as gardening and household tasks.      PT LONG TERM GOAL #4   Title Reduce FOTO to </= 43%    Baseline 49%    Status On-going      PT LONG TERM GOAL #5   Title Pt will be able to stand for at least 20 min with pain not to exceed 3/10 to improve pain with errands.    Baseline pain reaches 5/10 within 3-5 min of standing when pain at worst    Status On-going                 Plan - 07/14/20 0844    Clinical Impression Statement Pt continues to present with myofascial and joint restrictions in thoracic and lumbar spine.  She continues to be limited in static standing.  She is ind with HEP which does help somewhat.  PT encouraged her to consider an appt with an MD to see about imaging and/or other interventions at this point.  Pt will continue to benefit from skilled PT for manual therapy to increase mobility of joints and soft tissues to reduce low back pain and improve tolerance of daily tasks and work demands.    Rehab Potential Excellent    PT Frequency 1x / week    PT Duration 12 weeks    PT Treatment/Interventions ADLs/Self Care Home Management;Cryotherapy;Electrical Stimulation;Moist  Heat;Traction;Functional mobility training;Therapeutic activities;Therapeutic exercise;Balance training;Neuromuscular  re-education;Manual techniques;Patient/family education;Passive range of motion;Dry needling;Taping;Spinal Manipulations;Joint Manipulations    PT Next Visit Plan continue Rt L5/S1 facet mobs, STM, spinal ROM to improve lumbar ext and myofascial stretching    PT Home Exercise Plan Access Code: [email or web address]    Consulted and Agree with Plan of Care Patient           Patient will benefit from skilled therapeutic intervention in order to improve the following deficits and impairments:     Visit Diagnosis: Chronic midline low back pain without sciatica  Abnormal posture  Muscle weakness (generalized)  Cramp and spasm     Problem List Patient Active Problem List   Diagnosis Date Noted  . Recurrent UTI 12/26/2019  . Shoulder pain, left 12/26/2019  . Chronic bilateral low back pain without sciatica 06/28/2018  . Cervical cancer screening 05/02/2017  . Chronic migraine w/o aura w/o status migrainosus, not intractable 04/06/2017  . Obesity 11/10/2016  . Vitamin D deficiency 11/10/2016  . Preventative health care 11/10/2016  . History of chicken pox   . Allergy   . Diabetes mellitus type 2 in obese ([place])   . Hyperlipidemia     Keaun Schnabel, PT 07/14/20 8:47 AM  PHYSICAL THERAPY DISCHARGE SUMMARY  Visits from Start of Care: 16  Current functional level related to goals / functional outcomes: See above   Remaining deficits: See above.  Ongoing LBP with standing, improves with manual techniques and HEP but not long lasting relief.   Education / Equipment: HEP Plan: Patient agrees to discharge.  Patient goals were partially met. Patient is being discharged due to lack of progress.  ?????          Baruch Merl, PT 07/21/20 7:46 AM   [facility] [address] [place], [place], [address] Phone: [phone]   Fax:  [phone]  Name: Amanda James MRN: [id number] Date of Birth: [birth year]

## 2020-07-15 MED FILL — SAXENDA 18 MG/3 ML PEN: 18 | 30 days supply | Qty: 15 | Fill #2

## 2020-07-21 ENCOUNTER — Other Ambulatory Visit: Payer: Self-pay

## 2020-07-21 ENCOUNTER — Encounter: Payer: Self-pay | Admitting: Family Medicine

## 2020-07-21 ENCOUNTER — Ambulatory Visit: Payer: 59 | Admitting: Family Medicine

## 2020-07-21 ENCOUNTER — Other Ambulatory Visit: Payer: 59

## 2020-07-21 ENCOUNTER — Encounter: Payer: 59 | Admitting: Physical Therapy

## 2020-07-21 ENCOUNTER — Ambulatory Visit (INDEPENDENT_AMBULATORY_CARE_PROVIDER_SITE_OTHER)
Admission: RE | Admit: 2020-07-21 | Discharge: 2020-07-21 | Disposition: A | Discharge status: Home / Self Care | Payer: 59 | Source: Ambulatory Visit | Attending: Family Medicine | Admitting: Family Medicine

## 2020-07-21 VITALS — BP 104/70 | HR 77 | Ht 66.0 in | Wt 215.6 lb

## 2020-07-21 DIAGNOSIS — G8929 Other chronic pain: Secondary | ICD-10-CM

## 2020-07-21 DIAGNOSIS — M5136 Other intervertebral disc degeneration, lumbar region: Secondary | ICD-10-CM | POA: Diagnosis not present

## 2020-07-21 DIAGNOSIS — M545 Low back pain: Secondary | ICD-10-CM

## 2020-07-21 NOTE — Progress Notes (Signed)
I, Christoper Fabian, LAT, ATC, am serving as scribe for Dr. Clementeen Graham.  Amanda James is a 49 y.o. female who presents to [facility] today for low back pain.  She was last seen by Dr. Denyse Amass on 11/26/19 for her L shoulder and was shown a HEP and referred to PT.  Since then, she spoke to her PCP regarding low back pain and was referred to PT for that of which she has completed 16 visits.  She states that she's had chronic low back pain.  She states that she feels PT helped and she has been doing her HEP.  She locates her pain to her entire low back, R>L.  Aggravating factors include prolonged standing, gardening, lifting and lumbar flexion.  She will get occasional radiating pain into her R buttocks.  She denies any numbness/tinlging or weakness in her legs.  She's tried heat, NSAIDs and lidocaine patches. Physical therapy ongoing for months certainly longer than 6 weeks.  Pain is worse with prolonged standing.  Pertinent review of systems: No fevers or chills  Relevant historical information: Diabetes, migraine   Exam:  BP 104/70 (BP Location: Right Arm, Patient Position: Sitting, Cuff Size: Large)   Pulse 77   Ht 5\' 6"  (1.676 m)   Wt 215 lb 9.6 oz (97.8 kg)   LMP 07/10/2020   SpO2 97%   BMI 34.80 kg/m  General: Well Developed, well nourished, and in no acute distress.   MSK: L-spine normal. Nontender midline. Normal lumbar motion of flexion rotation lateral flexion.  Limited to extension. Lower extremity strength reflexes and sensation are intact except noted below. Hip abduction strength is diminished 4/5 bilaterally. Normal gait.    Lab and Radiology Results  X-ray images L-spine obtained today personally independently reviewed. Lumbarization of sacrum present. Facet DJD present L5-S1. Await formal radiology review.    Assessment and Plan: 49 y.o. female with chronic low back pain.  Patient has had extensive conservative management with  high-quality physical therapy starting as early as February through to her last physical therapy appointment on August 6. At this point it is time to proceed with advanced imaging to further characterize cause of pain and proceed with likely facet injection planning.  Plan for x-ray today followed by MRI in near future.  Recommend recheck after MRI to review results.    Orders Placed This Encounter  Procedures  . DG Lumbar Spine 2-3 Views    Standing Status:   Future    Number of Occurrences:   1    Standing Expiration Date:   07/21/2021    Order Specific Question:   Reason for Exam (SYMPTOM  OR DIAGNOSIS REQUIRED)    Answer:   eval chronic low back pain    Order Specific Question:   Is patient pregnant?    Answer:   No    Order Specific Question:   Preferred imaging location?    Answer:   07/23/2021    Order Specific Question:   Radiology Contrast Protocol - do NOT remove file path    Answer:   \\[email or web address]  . MR Lumbar Spine Wo Contrast    Standing Status:   Future    Standing Expiration Date:   07/21/2021    Order Specific Question:   What is the patient's sedation requirement?    Answer:   No Sedation    Order Specific Question:   Does the patient have a pacemaker or implanted devices?  Answer:   No    Order Specific Question:   Preferred imaging location?    Answer:   [facility] (table limit-350lbs)    Order Specific Question:   Radiology Contrast Protocol - do NOT remove file path    Answer:   \\[email or web address]\[email or web address]\[email or web address].PDF   No orders of the defined types were placed in this encounter.    Discussed warning signs or symptoms. Please see discharge instructions. Patient expresses understanding.   The above documentation has been reviewed and is accurate and complete Clementeen Graham, M.D.

## 2020-07-21 NOTE — Patient Instructions (Addendum)
Thank you for coming in today.  Plan for xray and MRI.  Ideally recheck in person after MRI if able.  Otherwise can we have a phone conversation.    Spondylolisthesis  Spondylolisthesis is when one of the bones in the spine (vertebra) slips forward and out of place. This commonly occurs in the lower back (lumbar spine), but it can happen anywhere along the spine. What are the causes? This condition may be caused by:  A break or crack (stress fracture) in a bone in the spine from doing sports or physical activities that: ? Put a lot of strain on the bones in the lower back. ? Involve repetitive overstretching (hyperextension) of the spine.  Injury (trauma) from an accident.  Wear and tear that happens as a person grows older. What increases the risk? The following factors may make you more likely to develop this condition:  Participating in sports or activities such as: ? Gymnastics. ? Figure skating. ? Weight lifting. ? Football.  Having a condition that affects the bones, such as osteoarthritis or cancer.  Being overweight. What are the signs or symptoms? Symptoms of this condition may include:  Mild to severe pain in the legs, lower back, or buttocks.  An abnormal way of walking (abnormal gait).  Poor posture.  Muscle stiffness, specifically in the hamstrings. The hamstrings are in the backs of the thighs.  Weakness, numbness, or a tingling sensation in the legs.  Neck pain, if the injury is at the top of the spine. Symptoms may get worse when standing, and they may temporarily get better when sitting down or bending forward. In some cases, there may be no symptoms of this condition. How is this diagnosed? This condition may be diagnosed based on:  Your symptoms.  Your medical history.  A physical exam.  Imaging tests, such as: ? X-rays. ? CT scan. ? MRI. How is this treated? This condition may be treated by:  Resting.  Pain medicines.  NSAIDs,  like ibuprofen, to help reduce swelling and discomfort.  Injections of medicine (cortisone) in your back. These injections can help to relieve pain and numbness.  A brace to stabilize and support your back.  Physical therapy. You may work with an occupational therapist or physical therapist who can teach you how to reduce pressure on your back while you do everyday activities.  Surgery. This may be needed if: ? Other treatment methods do not improve your condition. ? Your symptoms do not go away after 3-6 months. ? You have changes in control of your stool or urine. ? You are unable to walk or stand. ? You have severe pain. Follow these instructions at home: Medicines  Take over-the-counter and prescription medicines only as told by your health care provider.  Ask your health care provider if the medicine prescribed to you: ? Requires you to avoid driving or using heavy machinery. ? Can cause constipation. You may need to take these actions to prevent or treat constipation:  Drink enough fluid to keep your urine pale yellow.  Take over-the-counter or prescription medicines.  Eat foods that are high in fiber, such as beans, whole grains, and fresh fruits and vegetables.  Limit foods that are high in fat and processed sugars, such as fried or sweet foods. If you have a brace:  Wear the brace as told by your health care provider. Remove it only as told by your health care provider.  Keep the brace clean.  If the brace is not  waterproof: ? Do not let it get wet. ? Cover it with a watertight covering when you take a bath or a shower. Activity  Rest and return to your normal activities as told by your health care provider. Ask your health care provider what activities are safe for you.  Ask your health care provider when it is safe to drive if you have a back brace.  Work with a physical therapist to make a safe exercise program, as recommended by your health care provider. Do  exercises as told by your physical therapist. This may include exercises to strengthen your back and abdominal muscles (core exercises). Managing pain, stiffness, and swelling      If directed, put ice on the affected area. ? If you have a removable brace, remove it as told by your health care provider. ? Put ice in a plastic bag. ? Place a towel between your skin and the bag. ? Leave the ice on for 20 minutes, 2-3 times a day.  If directed, apply heat to the affected area as often as told by your health care provider. Use the heat source that your health care provider recommends, such as a moist heat pack or a heating pad. ? If you have a removable brace, remove it as told by your health care provider. ? Place a towel between your skin and the heat source. ? Leave the heat on for 20-30 minutes. ? Remove the heat if your skin turns bright red. This is especially important if you are unable to feel pain, heat, or cold. You may have a greater risk of getting burned. General instructions  Do not use any products that contain nicotine or tobacco, such as cigarettes, e-cigarettes, and chewing tobacco. These can delay bone healing. If you need help quitting, ask your health care provider.  If you are overweight, work with your health care provider and a dietitian to set a weight-loss goal that is healthy and reasonable for you.  Keep all follow-up visits as told by your health care provider. This is important. Contact a health care provider if:  You have pain that gets worse or does not get better. Get help right away if:  You have severe back or neck pain.  You have changes in control of your stool or urine.  You develop weakness or numbness in your legs.  You are unable to stand or walk. Summary  Spondylolisthesis is when one of the bones in the spine (vertebra) slips forward and out of place.  This condition may be treated with rest, medicines, wearing a brace, physical therapy,  or surgery.  Rest and return to your normal activities as told by your health care provider. Ask your health care provider what activities are safe for you.  Contact a health care provider if you have pain that gets worse or does not get better. This information is not intended to replace advice given to you by your health care provider. Make sure you discuss any questions you have with your health care provider. Document Revised: 03/18/2019 Document Reviewed: 06/30/2018 Elsevier Patient Education  2020 [employer].

## 2020-07-25 NOTE — Progress Notes (Signed)
Mild degenerative disc disease at L3-4 present.

## 2020-07-26 ENCOUNTER — Encounter: Payer: Self-pay | Admitting: Family Medicine

## 2020-07-28 ENCOUNTER — Encounter: Payer: 59 | Admitting: Physical Therapy

## 2020-07-30 ENCOUNTER — Other Ambulatory Visit: Payer: Self-pay

## 2020-07-30 ENCOUNTER — Ambulatory Visit (INDEPENDENT_AMBULATORY_CARE_PROVIDER_SITE_OTHER): Payer: 59

## 2020-07-30 DIAGNOSIS — M545 Low back pain, unspecified: Secondary | ICD-10-CM

## 2020-07-30 DIAGNOSIS — G8929 Other chronic pain: Secondary | ICD-10-CM

## 2020-07-31 ENCOUNTER — Encounter: Payer: Self-pay | Admitting: Family Medicine

## 2020-07-31 NOTE — Progress Notes (Signed)
MRI does show a pars defect bilaterally  at L5-S1. This will change PT goals.  You also do have some mild disc bulging. Could do injections or change PT goals. We will discuss this further at your follow up scheduled on Sep 3rd. Do you want me to re-order PT?

## 2020-08-04 ENCOUNTER — Encounter: Payer: 59 | Admitting: Physical Therapy

## 2020-08-04 MED FILL — WEGOVY 2.4 MG/0.75ML SOAJ: 2.4 | 28 days supply | Qty: 3 | Fill #0

## 2020-08-11 ENCOUNTER — Ambulatory Visit (INDEPENDENT_AMBULATORY_CARE_PROVIDER_SITE_OTHER): Payer: 59 | Admitting: Family Medicine

## 2020-08-11 ENCOUNTER — Encounter: Payer: 59 | Admitting: Physical Therapy

## 2020-08-11 ENCOUNTER — Encounter: Payer: Self-pay | Admitting: Family Medicine

## 2020-08-11 ENCOUNTER — Other Ambulatory Visit: Payer: Self-pay

## 2020-08-11 VITALS — BP 104/72 | HR 75 | Ht 66.0 in | Wt 217.2 lb

## 2020-08-11 DIAGNOSIS — M545 Low back pain, unspecified: Secondary | ICD-10-CM

## 2020-08-11 DIAGNOSIS — M4306 Spondylolysis, lumbar region: Secondary | ICD-10-CM

## 2020-08-11 DIAGNOSIS — G8929 Other chronic pain: Secondary | ICD-10-CM

## 2020-08-11 MED FILL — SODIUM FLUORIDE 5000 SENSIT: 1.1-5 | 30 days supply | Qty: 100 | Fill #1

## 2020-08-11 NOTE — Progress Notes (Signed)
I, Christoper Fabian, LAT, ATC, am serving as scribe for Dr. Clementeen Graham.  Amanda James is a 49 y.o. female who presents to [facility] at [facility] today for f/u of LBP, R>L and L-spine MRI review.  She was last seen by Dr. Denyse Amass on 07/21/20 and noted worsening pain w/ standing, gardening and lumbar flexion.  She denied any numbness/tingling or weakness in her B LEs.  She has completed a course of PT with some improvement noted.  Since her last visit w/ Dr. Denyse Amass, pt reports that her low back pain remains unchanged.  She notes some mild radiating pain into her R buttocks but otherwise denies any radicular symptoms.  Diagnostic testing: L-spine MRI- 07/30/20; L-spine XR- 07/21/20   Pertinent review of systems: No fevers or chills  Relevant historical information: History diabetes   Exam:  BP 104/72 (BP Location: Right Arm, Patient Position: Sitting, Cuff Size: Large)    Pulse 75    Ht 5\' 6"  (1.676 m)    Wt 217 lb 3.2 oz (98.5 kg)    SpO2 97%    BMI 35.06 kg/m  General: Well Developed, well nourished, and in no acute distress.   MSK: L-spine normal motion.  Normal gait.    Lab and Radiology Results EXAM: MRI LUMBAR SPINE WITHOUT CONTRAST  TECHNIQUE: Multiplanar, multisequence MR imaging of the lumbar spine was performed. No intravenous contrast was administered.  COMPARISON:  Radiography 07/21/2020  FINDINGS: Segmentation:  5 lumbar type vertebral bodies.  Alignment:  Normal  Vertebrae:  Normal  Conus medullaris and cauda equina: Conus extends to the L1 level. Conus and cauda equina appear normal.  Paraspinal and other soft tissues: Normal  Disc levels:  No abnormality at L2-3 or above.  L3-4: Desiccation and mild bulging of the disc. Mild facet osteoarthritis. No compressive stenosis.  L4-5: Mild desiccation and bulging of the disc. Mild facet osteoarthritis. No compressive stenosis.  L5-S1: Minimal bulging of the disc. Probable  bilateral pars defects and some facet osteoarthritis. No compressive stenosis.  IMPRESSION: L3-4 and L4-5: Mild bulging of the disc. Mild facet osteoarthritis. No compressive stenosis. Findings could contribute to low back pain.  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 Minimal disc bulge. No stenosis. Findings could contribute to low back pain.   Electronically Signed   By: 07/23/2020 M.D.   On: 07/30/2020 13:56  I, 08/01/2020, personally (independently) visualized and performed the interpretation of the images attached in this note. Agree with radiology read.  Pars defects bilaterally.    Assessment and Plan: 49 y.o. female with chronic low back pain without significant radiculopathy.  Patient does have a bit of facet arthritis but not dramatic.  She is already had good trial of physical therapy.  Plan to communicate with the physical therapist to see if they think knowledge of pars defect would dramatically change physical therapy plans going forward.  I do not think that is quite likely.  Plan for trial of epidural steroid injection interlaminar at L5-S1.  This may provide some benefit.  If it does not proceed with facet injection trials.  Patient will keep me updated.  Recheck back as needed.   PDMP not reviewed this encounter. Orders Placed This Encounter  Procedures   DG INJECT DIAG/THERA/INC NEEDLE/CATH/PLC EPI/LUMB/SAC W/IMG    LUMB EPI #1 L5-S1 ? INS 217 LBS PACS (07/30/20) ? THINS/OTC    Standing Status:   Future    Standing Expiration Date:  08/11/2021    Order Specific Question:   Reason for Exam (SYMPTOM  OR DIAGNOSIS REQUIRED)    Answer:   Interlaminar injection L5-S1    Order Specific Question:   Is the patient pregnant?    Answer:   No    Order Specific Question:   Preferred Imaging Location?    Answer:   [id number]    Order Specific Question:   Radiology Contrast Protocol - do NOT remove file path    Answer:    [email or web address]   No orders of the defined types were placed in this encounter.    Discussed warning signs or symptoms. Please see discharge instructions. Patient expresses understanding.   The above documentation has been reviewed and is accurate and complete Clementeen Graham, M.D. Total encounter time 20 minutes including face-to-face time with the patient and charting on the date of service.

## 2020-08-11 NOTE — Patient Instructions (Addendum)
Thank you for coming in today.  Plan for epidural steroid injection.  Consider facet injections.  Consider repeat PT with more knowledge.  Ok to ask your PT if the MRI results may change your plan.   Keep me updated.   Call [place] [facility] to schedule the injection at [phone].  Epidural Steroid Injection Patient Information  Description: The epidural space surrounds the nerves as they exit the spinal cord.  In some patients, the nerves can be compressed and inflamed by a bulging disc or a tight spinal canal (spinal stenosis).  By injecting steroids into the epidural space, we can bring irritated nerves into direct contact with a potentially helpful medication.  These steroids act directly on the irritated nerves and can reduce swelling and inflammation which often leads to decreased pain.  Epidural steroids may be injected anywhere along the spine and from the neck to the low back depending upon the location of your pain.   After numbing the skin with local anesthetic (like Novocaine), a small needle is passed into the epidural space slowly.  You may experience a sensation of pressure while this is being done.  The entire block usually last less than 10 minutes.  Conditions which may be treated by epidural steroids:   Low back and leg pain  Neck and arm pain  Spinal stenosis  Post-laminectomy syndrome  Herpes zoster (shingles) pain  Pain from compression fractures  Preparation for the injection:  1. Do not eat any solid food or dairy products within 8 hours of your appointment.  2. You may drink clear liquids up to 3 hours before appointment.  Clear liquids include water, black coffee, juice or soda.  No milk or cream please. 3. You may take your regular medication, including pain medications, with a sip of water before your appointment  Diabetics should hold regular insulin (if taken separately) and take 1/2 normal NPH dos the morning of the procedure.  Carry some sugar  containing items with you to your appointment. 4. A driver must accompany you and be prepared to drive you home after your procedure.  5. Bring all your current medications with your. 6. An IV may be inserted and sedation may be given at the discretion of the physician.   7. A blood pressure cuff, EKG and other monitors will often be applied during the procedure.  Some patients may need to have extra oxygen administered for a short period. 8. You will be asked to provide medical information, including your allergies, prior to the procedure.  We must know immediately if you are taking blood thinners (like Coumadin/Warfarin)  Or if you are allergic to IV iodine contrast (dye). We must know if you could possible be pregnant.  Possible side-effects:  Bleeding from needle site  Infection (rare, may require surgery)  Nerve injury (rare)  Numbness & tingling (temporary)  Difficulty urinating (rare, temporary)  Spinal headache ( a headache worse with upright posture)  Light -headedness (temporary)  Pain at injection site (several days)  Decreased blood pressure (temporary)  Weakness in arm/leg (temporary)  Pressure sensation in back/neck (temporary)  Call if you experience:  Fever/chills associated with headache or increased back/neck pain.  Headache worsened by an upright position.  New onset weakness or numbness of an extremity below the injection site  Hives or difficulty breathing (go to the emergency room)  Inflammation or drainage at the infection site  Severe back/neck pain  Any new symptoms which are concerning to you  Please note:  Although the local anesthetic injected can often make your back or neck feel good for several hours after the injection, the pain will likely return.  It takes 3-7 days for steroids to work in the epidural space.  You may not notice any pain relief for at least that one week.  If effective, we will often do a series of three injections  spaced 3-6 weeks apart to maximally decrease your pain.  After the initial series, we generally will wait several months before considering a repeat injection of the same type.

## 2020-08-16 ENCOUNTER — Encounter: Payer: Self-pay | Admitting: Family Medicine

## 2020-08-16 ENCOUNTER — Other Ambulatory Visit: Payer: Self-pay | Admitting: Family Medicine

## 2020-08-16 ENCOUNTER — Other Ambulatory Visit: Payer: Self-pay | Admitting: *Deleted

## 2020-08-16 MED ORDER — ATORVASTATIN CALCIUM 10 MG PO TABS
10.0000 mg | ORAL_TABLET | Freq: Every day | ORAL | 1 refills | Status: DC
Start: 2020-08-16 — End: 2021-02-02

## 2020-08-16 MED FILL — SAXENDA 18 MG/3 ML PEN: 18 | 30 days supply | Qty: 15 | Fill #3

## 2020-08-16 MED FILL — ESCITALOPRAM 20 MG TABLET: 20 | 90 days supply | Qty: 90 | Fill #0

## 2020-08-16 MED FILL — ATORVASTATIN CALCIUM 10 MG: 10 | 90 days supply | Qty: 90 | Fill #0

## 2020-08-17 ENCOUNTER — Other Ambulatory Visit: Payer: Self-pay | Admitting: Family Medicine

## 2020-08-17 DIAGNOSIS — Z1231 Encounter for screening mammogram for malignant neoplasm of breast: Secondary | ICD-10-CM

## 2020-08-18 ENCOUNTER — Encounter: Payer: 59 | Admitting: Physical Therapy

## 2020-08-21 ENCOUNTER — Other Ambulatory Visit: Payer: Self-pay | Admitting: Family Medicine

## 2020-08-21 MED ORDER — WEGOVY 2.4 MG/0.75ML ~~LOC~~ SOAJ: 2.4000 mg | SUBCUTANEOUS | 5 refills | Status: DC

## 2020-08-25 ENCOUNTER — Ambulatory Visit
Admission: RE | Admit: 2020-08-25 | Discharge: 2020-08-25 | Disposition: A | Discharge status: Home / Self Care | Payer: 59 | Source: Ambulatory Visit | Attending: Family Medicine | Admitting: Family Medicine

## 2020-08-25 ENCOUNTER — Other Ambulatory Visit: Payer: Self-pay

## 2020-08-25 ENCOUNTER — Ambulatory Visit (INDEPENDENT_AMBULATORY_CARE_PROVIDER_SITE_OTHER): Payer: 59 | Admitting: Neurology

## 2020-08-25 ENCOUNTER — Other Ambulatory Visit: Payer: Self-pay | Admitting: Family Medicine

## 2020-08-25 ENCOUNTER — Ambulatory Visit
Admission: RE | Admit: 2020-08-25 | Discharge: 2020-08-25 | Disposition: A | Discharge status: Home / Self Care | Payer: 59 | Source: Ambulatory Visit

## 2020-08-25 DIAGNOSIS — G459 Transient cerebral ischemic attack, unspecified: Secondary | ICD-10-CM | POA: Diagnosis not present

## 2020-08-25 DIAGNOSIS — I7771 Dissection of carotid artery: Secondary | ICD-10-CM | POA: Diagnosis not present

## 2020-08-25 DIAGNOSIS — R519 Headache, unspecified: Secondary | ICD-10-CM | POA: Diagnosis not present

## 2020-08-25 DIAGNOSIS — R51 Headache with orthostatic component, not elsewhere classified: Secondary | ICD-10-CM | POA: Diagnosis not present

## 2020-08-25 DIAGNOSIS — G8929 Other chronic pain: Secondary | ICD-10-CM

## 2020-08-25 DIAGNOSIS — M47817 Spondylosis without myelopathy or radiculopathy, lumbosacral region: Secondary | ICD-10-CM | POA: Diagnosis not present

## 2020-08-25 DIAGNOSIS — H5460 Unqualified visual loss, one eye, unspecified: Secondary | ICD-10-CM

## 2020-08-25 DIAGNOSIS — H93A2 Pulsatile tinnitus, left ear: Secondary | ICD-10-CM | POA: Diagnosis not present

## 2020-08-25 DIAGNOSIS — G43709 Chronic migraine without aura, not intractable, without status migrainosus: Secondary | ICD-10-CM

## 2020-08-25 DIAGNOSIS — H5462 Unqualified visual loss, left eye, normal vision right eye: Secondary | ICD-10-CM | POA: Diagnosis not present

## 2020-08-25 DIAGNOSIS — Z1231 Encounter for screening mammogram for malignant neoplasm of breast: Secondary | ICD-10-CM

## 2020-08-25 DIAGNOSIS — R400 Somnolence: Secondary | ICD-10-CM

## 2020-08-25 DIAGNOSIS — R0683 Snoring: Secondary | ICD-10-CM

## 2020-08-25 DIAGNOSIS — G453 Amaurosis fugax: Secondary | ICD-10-CM | POA: Diagnosis not present

## 2020-08-25 DIAGNOSIS — M545 Low back pain, unspecified: Secondary | ICD-10-CM

## 2020-08-25 DIAGNOSIS — M4306 Spondylolysis, lumbar region: Secondary | ICD-10-CM

## 2020-08-25 MED ORDER — METHYLPREDNISOLONE ACETATE 40 MG/ML INJ SUSP (RADIOLOG
120.0000 mg | Freq: Once | INTRAMUSCULAR | Status: AC
  Administered 2020-08-25: 120 mg via EPIDURAL

## 2020-08-25 MED ORDER — IOPAMIDOL (ISOVUE-M 200) INJECTION 41%
1.0000 mL | Freq: Once | INTRAMUSCULAR | Status: AC
  Administered 2020-08-25: 1 mL via EPIDURAL

## 2020-08-25 NOTE — Progress Notes (Signed)
Consent Form Botulism Toxin Injection For Chronic Migraine Interval history the time of August 25, 2020: Patient is doing excellent as far as migraines go, she has had a greater than 80% reduction in severity and frequency of migraines and headaches.  We do the frontalis high on the forehead, ask her if she felt as though this cause any significant problems with slowness of her brow and if so we can always go closer to the hairline or reduce by half the dosage and frontalis.  She also has clenching issues with pain in her clenching muscles as well as in the temporalis where you can feel the clenching muscles hypertrophy.  Reviewed orally with patient, additionally signature is on file:  Botulism toxin has been approved by the Federal drug administration for treatment of chronic migraine. Botulism toxin does not cure chronic migraine and it may not be effective in some patients.  The administration of botulism toxin is accomplished by injecting a small amount of toxin into the muscles of the neck and head. Dosage must be titrated for each individual. Any benefits resulting from botulism toxin tend to wear off after 3 months with a repeat injection required if benefit is to be maintained. Injections are usually done every 3-4 months with maximum effect peak achieved by about 2 or 3 weeks. Botulism toxin is expensive and you should be sure of what costs you will incur resulting from the injection.  The side effects of botulism toxin use for chronic migraine may include:   -Transient, and usually mild, facial weakness with facial injections  -Transient, and usually mild, head or neck weakness with head/neck injections  -Reduction or loss of forehead facial animation due to forehead muscle weakness  -Eyelid drooping  -Dry eye  -Pain at the site of injection or bruising at the site of injection  -Double vision  -Potential unknown long term risks  Contraindications: You should not have Botox if you  are pregnant, nursing, allergic to albumin, have an infection, skin condition, or muscle weakness at the site of the injection, or have myasthenia gravis, Lambert-Eaton syndrome, or ALS.  It is also possible that as with any injection, there may be an allergic reaction or no effect from the medication. Reduced effectiveness after repeated injections is sometimes seen and rarely infection at the injection site may occur. All care will be taken to prevent these side effects. If therapy is given over a long time, atrophy and wasting in the muscle injected may occur. Occasionally the patient's become refractory to treatment because they develop antibodies to the toxin. In this event, therapy needs to be modified.  I have read the above information and consent to the administration of botulism toxin.    BOTOX PROCEDURE NOTE FOR MIGRAINE HEADACHE    Contraindications and precautions discussed with patient(above). Aseptic procedure was observed and patient tolerated procedure. Procedure performed by Dr. Artemio Aly  The condition has existed for more than 6 months, and pt does not have a diagnosis of ALS, Myasthenia Gravis or Lambert-Eaton Syndrome.  Risks and benefits of injections discussed and pt agrees to proceed with the procedure.  Written consent obtained  These injections are medically necessary. Pt  receives good benefits from these injections. These injections do not cause sedations or hallucinations which the oral therapies may cause.  Description of procedure:  The patient was placed in a sitting position. The standard protocol was used for Botox as follows, with 5 units of Botox injected at each site:   -  Procerus muscle, midline injection  -Corrugator muscle, bilateral injection  -Frontalis muscle, bilateral injection, with 2 sites each side, medial injection was performed in the upper one third of the frontalis muscle, in the region vertical from the medial inferior edge of the  superior orbital rim. The lateral injection was again in the upper one third of the forehead vertically above the lateral limbus of the cornea, 1.5 cm lateral to the medial injection site.  -Temporalis muscle injection, 4 sites, bilaterally. The first injection was 3 cm above the tragus of the ear, second injection site was 1.5 cm to 3 cm up from the first injection site in line with the tragus of the ear. The third injection site was 1.5-3 cm forward between the first 2 injection sites. The fourth injection site was 1.5 cm posterior to the second injection site.   -Occipitalis muscle injection, 3 sites, bilaterally. The first injection was done one half way between the occipital protuberance and the tip of the mastoid process behind the ear. The second injection site was done lateral and superior to the first, 1 fingerbreadth from the first injection. The third injection site was 1 fingerbreadth superiorly and medially from the first injection site.  -Cervical paraspinal muscle injection, 2 sites, bilateral knee first injection site was 1 cm from the midline of the cervical spine, 3 cm inferior to the lower border of the occipital protuberance. The second injection site was 1.5 cm superiorly and laterally to the first injection site.  -Trapezius muscle injection was performed at 3 sites, bilaterally. The first injection site was in the upper trapezius muscle halfway between the inflection point of the neck, and the acromion. The second injection site was one half way between the acromion and the first injection site. The third injection was done between the first injection site and the inflection point of the neck.   Will return for repeat injection in 3 months.   200 units of Botox was used, any Botox not injected was wasted. The patient tolerated the procedure well, there were no complications of the above procedure.  [id number] 09/2022 [phone]

## 2020-08-25 NOTE — Progress Notes (Signed)
Botox- 200 units x 1 vial Lot: [id number] Expiration: 04/2023 NDC: 7425-9563-87 B/B  Dysport 250 units  LOT: [id number] EXP: 10/08/2020 NDC: [address]-8 Sample  Bacteriostatic 0.9% Sodium Chloride- 6 mL total Lot: [id number] Expiration: 09/08/2021 NDC: 6301-6010-93  Dx: A35.573

## 2020-08-25 NOTE — Discharge Instructions (Signed)

## 2020-08-27 ENCOUNTER — Telehealth: Payer: Self-pay | Admitting: Neurology

## 2020-08-27 NOTE — Progress Notes (Signed)
[facility]    Provider:  Dr Jaynee Eagles Referring Provider: Mosie Lukes, MD Primary Care Physician:  Mosie Lukes, MD  CC:  Migraine  Interval history August 25, 2020: This is this is a lovely patient who is 49 years old who has seen me for migraines in the past and is doing very well with her migraines.  Unfortunately she has had an increase in morning headaches, snoring and fatigue and positional headaches which are new and different than her prior disorders.  She also does not have a history of auras and patient had an episode of nontraumatic but may have happened with head turn, felt pulsating tinnitus in the left ear, morning headache worse positionally supine, left vision loss,  acute/subacute monocular vision loss, highly concerning, in a young patient we need to rule out stroke, carotid dissection, multiple sclerosis, disorders of the optic nerve or chiasm, artery occlusion, aneurysm. May be bening vision changes associated with migraine but needs thorough evaluation.  The monocular vision loss resolved so very unlikely to be temporal arteritis  HPI:  Amanda James is a 49 y.o. female here as a referral from Dr. Charlett Blake for migraines. She has a past medical history of type 2 diabetes, hyperlipidemia, obesity, allergies, depression, chronic low back pain, and vitamin D deficiency. Migraines started 20 years ago. Triggers include pulling her hair back and then it is a pounding pain. Her headaches can be 2-3x a day and can be daily or last days.She can go a week or longer without a headache but always with a least half the month with headaches. No medication overuse. Triggers for her headaches can be putting her hair back. She used ot get a headache with orgasm in the past which resolved. She has TMJ. She can have morning headaches with exacerbations. Red wine can trigger. Caffeine and food does not trigger. Her headaches tend to be on the top pf the head worse on the  left. Throbbing and pounding. Mild nausea. No vomiting. No aura. She has light sensitivity, sound and smell sensitivity. She has a lot of musculoskeletal neck pain worse on the left. She goes to [facility] [employer] and massages help. Within 5 minutes the migraine can be at full peak. They can be moderately severe at least on average she can work throughout the day. They can wax and wane all day long, can last 2-3 days. She has more than 15 headaches in a month and 11-12 are migrainous. Her migraines have been at at this frequency for over a year. Wearing contacts can cause pain. She snores. Morning headaches. Unknown history. She has blurry vision with the headaches. She has had swishing and heartbeat in the ear when laying down. No other focal neurologic deficits, associated symptoms, inciting events or modifiable factors.  Failed Lexpro and Topiramate. Has been on Lexapro for many years, had side effects with Topiramate  Medications: NSAIDs help better than tylenol, flexeril (doesn't help), Topiramate, Lexapro. Never tried a triptan.   Review of Systems: Patient complains of symptoms per HPI as well as the following symptoms: no CP, no SOB. Pertinent negatives per HPI. All others negative.   Social History   Socioeconomic History  . Marital status: Married    Spouse name: Not on file  . Number of children: 1  . Years of education: MD  . Highest education level: Not on file  Occupational History  . Occupation: [employer]  Tobacco Use  . Smoking status: Never Smoker  .  Smokeless tobacco: Never Used  Substance and Sexual Activity  . Alcohol use: Yes    Alcohol/week: 5.0 standard drinks    Types: 5 Glasses of wine per week    Comment: 3-5 glasses of wine per week  . Drug use: No  . Sexual activity: Not on file  Other Topics Concern  . Not on file  Social History Narrative   Works With [employer], lives with husband, 3 cats   No major dietary restrictions. Exercise 30 minutes 3 x a week   Seat  belts routinely   Right-handed   Caffeine: 1-2 cups of coffee per day   Social Determinants of Health   Financial Resource Strain:   . Difficulty of Paying Living Expenses: Not on file  Food Insecurity:   . Worried About Charity fundraiser in the Last Year: Not on file  . Ran Out of Food in the Last Year: Not on file  Transportation Needs:   . Lack of Transportation (Medical): Not on file  . Lack of Transportation (Non-Medical): Not on file  Physical Activity:   . Days of Exercise per Week: Not on file  . Minutes of Exercise per Session: Not on file  Stress:   . Feeling of Stress : Not on file  Social Connections:   . Frequency of Communication with Friends and Family: Not on file  . Frequency of Social Gatherings with Friends and Family: Not on file  . Attends Religious Services: Not on file  . Active Member of Clubs or Organizations: Not on file  . Attends Archivist Meetings: Not on file  . Marital Status: Not on file  Intimate Partner Violence:   . Fear of Current or Ex-Partner: Not on file  . Emotionally Abused: Not on file  . Physically Abused: Not on file  . Sexually Abused: Not on file    Family History  Problem Relation Age of Onset  . Diabetes Mother   . Hypertension Mother   . Hyperlipidemia Mother   . Obesity Mother   . Heart disease Mother        pacer  . Diabetes Father   . Heart disease Father        MI  . Hyperlipidemia Father   . Hypertension Father   . Obesity Father   . Diabetes Sister   . Hyperlipidemia Sister   . Hypertension Sister   . Obesity Sister   . Obesity Maternal Grandmother   . Hypertension Maternal Grandmother   . Hyperlipidemia Maternal Grandmother   . Diabetes Maternal Grandmother   . Rosacea Maternal Grandmother   . Non-Hodgkin's lymphoma Maternal Grandmother   . Arthritis Maternal Grandmother        spinal stenosis  . Multiple myeloma Maternal Grandfather   . Hyperlipidemia Paternal Grandmother        rhabdo  from statins  . Kidney disease Paternal Grandmother   . Migraines Neg Hx     Past Medical History:  Diagnosis Date  . Allergy   . Cervical cancer screening 05/02/2017  . Depression   . Depression with anxiety   . Diabetes mellitus type 2 in obese ([place])   . Diabetes mellitus without complication ([place])   . History of chicken pox   . Hyperlipidemia   . Low back pain   . Migraines   . Obesity 11/10/2016    Past Surgical History:  Procedure Laterality Date  . ABDOMINOPLASTY  2012  . AUGMENTATION MAMMAPLASTY     maxilift  with implants   . BLADDER SUSPENSION  2016  . LAPAROSCOPIC GASTRIC BANDING  2005   esophageal dilation. band still present but released  . MASTOPEXY  2012    Current Outpatient Medications  Medication Sig Dispense Refill  . atorvastatin (LIPITOR) 10 MG tablet Take 1 tablet (10 mg total) by mouth daily. 90 tablet 1  . Cetirizine HCl (ZYRTEC ALLERGY) 10 MG CAPS Take 1 capsule by mouth daily.    . cyclobenzaprine (FLEXERIL) 10 MG tablet TAKE 1 TABLET (10 MG TOTAL) BY MOUTH 3 (THREE) TIMES DAILY AS NEEDED FOR MUSCLE SPASMS. 30 tablet 2  . Erenumab-aooe (AIMOVIG) 140 MG/ML SOAJ Inject 140 mg into the skin every 30 (thirty) days. 1 pen 5  . escitalopram (LEXAPRO) 20 MG tablet Take 1 tablet (20 mg total) by mouth daily. 90 tablet 1  . nitrofurantoin, macrocrystal-monohydrate, (MACROBID) 100 MG capsule Take 1 capsule (100 mg total) by mouth 2 (two) times daily as needed. 20 capsule 1  . Semaglutide-Weight Management (WEGOVY) 2.4 MG/0.75ML SOAJ Inject 2.4 mg into the skin once a week. 3 mL 5  . tretinoin (RETIN-A) 0.025 % cream Apply topically at bedtime. To affected areas 45 g 5   No current facility-administered medications for this visit.    Allergies as of 08/25/2020 - Review Complete 08/25/2020  Allergen Reaction Noted  . Erythromycin Diarrhea 09/17/2016    Vitals: LMP 07/28/2020 (Exact Date)  Last Weight:  Wt Readings from Last 1 Encounters:  08/11/20 217  lb 3.2 oz (98.5 kg)   Last Height:   Ht Readings from Last 1 Encounters:  08/11/20 '5\' 6"'  (1.676 m)    Physical exam: Exam: Gen: NAD, conversant, well nourised, obese, well groomed                     CV: RRR, no MRG. No Carotid Bruits. No peripheral edema, warm, nontender Eyes: Conjunctivae clear without exudates or hemorrhage  Neuro: Detailed Neurologic Exam  Speech:    Speech is normal; fluent and spontaneous with normal comprehension.  Cognition:    The patient is oriented to person, place, and time;     recent and remote memory intact;     language fluent;     normal attention, concentration,     fund of knowledge Cranial Nerves:    The pupils are equal, round, and reactive to light. The fundi are normal and spontaneous venous pulsations are present. Visual fields are full to finger confrontation. Extraocular movements are intact. Trigeminal sensation is intact and the muscles of mastication are normal. The face is symmetric. The palate elevates in the midline. Hearing intact. Voice is normal. Shoulder shrug is normal. The tongue has normal motion without fasciculations.   Coordination:    Normal finger to nose and heel to shin. Normal rapid alternating movements.   Gait:    Heel-toe and tandem gait are normal.   Motor Observation:    No asymmetry, no atrophy, and no involuntary movements noted. Tone:    Normal muscle tone.    Posture:    Posture is normal. normal erect    Strength:    Strength is V/V in the upper and lower limbs.      Sensation: intact to LT     Reflex Exam:  DTR's:    Deep tendon reflexes in the upper and lower extremities are normal bilaterally.   Toes:    The toes are downgoing bilaterally.   Clonus:    Clonus is absent.  Assessment/Plan:  This is this is a lovely patient who is 49 years old who has seen me for migraines in the past and is doing very well with her migraines.  Unfortunately she has had an increase in morning  headaches, snoring and fatigue and positional headaches which are new and different than her prior disorders.  She also does not have a history of auras and patient had an episode of nontraumatic but may have happened with head turn, acute/subacute monocular vision loss, highly concerning, in a young patient with no vascular risk factors so we need to rule out stroke or TIA, carotid dissection, multiple sclerosis, disorders of the optic nerve or chiasm, artery occlusion, aneurysm.  Needs a thorough evaluation.   MRI of the brain and MRA of the head/Neck: due to concerning symptoms of morning headaches, positional headaches(worse supine in the morning),transient left eye vision loss vision loss, monocular vision loss with some transient left-sided pulsatile tinnitus, May be bening vision changes associated with migraine but needs thorough evaluation to look for stroke, dissection, MS or other disorders of the optic nerve or chiasm, artery occlusion, space occupying mass, chiari or intracranial hypertension (pseudotumor) and other.  Echocardiogram Sleep evaluation for morning headache: Dr. Brett Fairy; morning headache is always an indication for sleep evaluation, she also reports fatigue and snoring MRi of the brain and MRA/Head and Neck as above Consider daily ASA after evaluation  Orders Placed This Encounter  Procedures  . MR BRAIN W WO CONTRAST  . MR ANGIO HEAD WO CONTRAST  . MR ANGIO NECK W WO CONTRAST  . Ambulatory referral to Sleep Studies  . ECHOCARDIOGRAM COMPLETE BUBBLE STUDY     Continue Botox and Ajovy  Discussed the following: To prevent or relieve headaches, try the following: Cool Compress. Lie down and place a cool compress on your head.  Avoid headache triggers. If certain foods or odors seem to have triggered your migraines in the past, avoid them. A headache diary might help you identify triggers.  Include physical activity in your daily routine. Try a daily walk or other  moderate aerobic exercise.  Manage stress. Find healthy ways to cope with the stressors, such as delegating tasks on your to-do list.  Practice relaxation techniques. Try deep breathing, yoga, massage and visualization.  Eat regularly. Eating regularly scheduled meals and maintaining a healthy diet might help prevent headaches. Also, drink plenty of fluids.  Follow a regular sleep schedule. Sleep deprivation might contribute to headaches Consider biofeedback. With this mind-body technique, you learn to control certain bodily functions -- such as muscle tension, heart rate and blood pressure -- to prevent headaches or reduce headache pain.    Proceed to emergency room if you experience new or worsening symptoms or symptoms do not resolve, if you have new neurologic symptoms or if headache is severe, or for any concerning symptom.    Sarina Ill, MD  [facility] [address] [place], [place] [address]  Phone [phone] Fax [phone]  I spent 20 minutes of face-to-face and non-face-to-face time with patient on the  1. Chronic migraine without aura without status migrainosus, not intractable   2. Morning headache   3. Monocular vision loss   4. Positional headache   5. Worsening headaches   6. Pulsatile tinnitus of left ear   7. Vision loss of left eye   8. 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 TIA (transient ischemic attack)  11. Snoring   12. Daytime somnolence    diagnosis.  This included previsit chart review, lab review, study review, order entry, electronic health record documentation, patient education on the different diagnostic and therapeutic options, counseling and coordination of care, risks and benefits of management, compliance, or risk factor reduction. This does not include time spent on botox procedure.

## 2020-08-27 NOTE — Telephone Encounter (Signed)
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 See staff message I sent you. She can be shceduled at [facility] at her convceneince thanks

## 2020-08-28 ENCOUNTER — Other Ambulatory Visit: Payer: Self-pay | Admitting: Family Medicine

## 2020-08-28 DIAGNOSIS — G43709 Chronic migraine without aura, not intractable, without status migrainosus: Secondary | ICD-10-CM

## 2020-08-28 NOTE — Telephone Encounter (Signed)
Patient returned my call she is scheduled at [facility] for 09/13/20. °

## 2020-08-28 NOTE — Telephone Encounter (Signed)
LVM for pt to call back about scheduling mri  [facility] Auth: NPR Ref # [id number]

## 2020-09-04 MED FILL — WEGOVY 2.4 MG/0.75ML SOAJ: 2.4 | 28 days supply | Qty: 3 | Fill #0

## 2020-09-11 ENCOUNTER — Other Ambulatory Visit: Payer: Self-pay

## 2020-09-11 ENCOUNTER — Ambulatory Visit (INDEPENDENT_AMBULATORY_CARE_PROVIDER_SITE_OTHER): Payer: 59 | Admitting: Neurology

## 2020-09-11 ENCOUNTER — Encounter: Payer: Self-pay | Admitting: Neurology

## 2020-09-11 VITALS — BP 118/84 | HR 79 | Ht 66.0 in | Wt 214.0 lb

## 2020-09-11 DIAGNOSIS — Z6834 Body mass index (BMI) 34.0-34.9, adult: Secondary | ICD-10-CM | POA: Diagnosis not present

## 2020-09-11 DIAGNOSIS — G43709 Chronic migraine without aura, not intractable, without status migrainosus: Secondary | ICD-10-CM

## 2020-09-11 DIAGNOSIS — R0683 Snoring: Secondary | ICD-10-CM

## 2020-09-11 DIAGNOSIS — E6609 Other obesity due to excess calories: Secondary | ICD-10-CM

## 2020-09-11 DIAGNOSIS — R519 Headache, unspecified: Secondary | ICD-10-CM | POA: Diagnosis not present

## 2020-09-11 DIAGNOSIS — E66811 Obesity, class 1: Secondary | ICD-10-CM | POA: Insufficient documentation

## 2020-09-11 NOTE — Patient Instructions (Signed)
Screening for Sleep Apnea  Sleep apnea is a condition in which breathing pauses or becomes shallow during sleep. Sleep apnea screening is a test to determine if you are at risk for sleep apnea. The test is easy and only takes a few minutes. Your health care provider may ask you to have this test in preparation for surgery or as part of a physical exam. What are the symptoms of sleep apnea? Common symptoms of sleep apnea include:  Snoring.  Restless sleep.  Daytime sleepiness.  Pauses in breathing.  Choking during sleep.  Irritability.  Forgetfulness.  Trouble thinking clearly.  Depression.  Personality changes. Most people with sleep apnea are not aware that they have it. Why should I get screened? Getting screened for sleep apnea can help:  Ensure your safety. It is important for your health care providers to know whether or not you have sleep apnea, especially if you are having surgery or have other long-term (chronic) health conditions.  Improve your health and allow you to get a better night's rest. Restful sleep can help you: ? Have more energy. ? Lose weight. ? Improve high blood pressure. ? Improve diabetes management. ? Prevent stroke. ? Prevent car accidents. How is screening done? Screening usually includes being asked a list of questions about your sleep quality. Some questions you may be asked include:  Do you snore?  Is your sleep restless?  Do you have daytime sleepiness?  Has a partner or spouse told you that you stop breathing during sleep?  Have you had trouble concentrating or memory loss? If your screening test is positive, you are at risk for the condition. Further testing may be needed to confirm a diagnosis of sleep apnea. Where to find more information You can find screening tools online or at your health care clinic. For more information about sleep apnea screening and healthy sleep, visit these websites:  Centers for Disease Control and  Prevention: www.cdc.gov/sleep/index.html  American Sleep Apnea Association: www.sleepapnea.org Contact a health care provider if:  You think that you may have sleep apnea. Summary  Sleep apnea screening can help determine if you are at risk for sleep apnea.  It is important for your health care providers to know whether or not you have sleep apnea, especially if you are having surgery or have other chronic health conditions.  You may be asked to take a screening test for sleep apnea in preparation for surgery or as part of a physical exam. This information is not intended to replace advice given to you by your health care provider. Make sure you discuss any questions you have with your health care provider. Document Revised: 09/11/2018 Document Reviewed: 03/07/2017 Elsevier Patient Education  2020 Elsevier Inc. Quality Sleep Information, Adult Quality sleep is important for your mental and physical health. It also improves your quality of life. Quality sleep means you:  Are asleep for most of the time you are in bed.  Fall asleep within 30 minutes.  Wake up no more than once a night.  Are awake for no longer than 20 minutes if you do wake up during the night. Most adults need 7-8 hours of quality sleep each night. How can poor sleep affect me? If you do not get enough quality sleep, you may have:  Mood swings.  Daytime sleepiness.  Confusion.  Decreased reaction time.  Sleep disorders, such as insomnia and sleep apnea.  Difficulty with: ? Solving problems. ? Coping with stress. ? Paying attention. These issues may   affect your performance and productivity at work, school, and at home. Lack of sleep may also put you at higher risk for accidents, suicide, and risky behaviors. If you do not get quality sleep you may also be at higher risk for several health problems, including:  Infections.  Type 2 diabetes.  Heart disease.  High blood  pressure.  Obesity.  Worsening of long-term conditions, like arthritis, kidney disease, depression, Parkinson's disease, and epilepsy. What actions can I take to get more quality sleep?      Stick to a sleep schedule. Go to sleep and wake up at about the same time each day. Do not try to sleep less on weekdays and make up for lost sleep on weekends. This does not work.  Try to get about 30 minutes of exercise on most days. Do not exercise 2-3 hours before going to bed.  Limit naps during the day to 30 minutes or less.  Do not use any products that contain nicotine or tobacco, such as cigarettes or e-cigarettes. If you need help quitting, ask your health care provider.  Do not drink caffeinated beverages for at least 8 hours before going to bed. Coffee, tea, and some sodas contain caffeine.  Do not drink alcohol close to bedtime.  Do not eat large meals close to bedtime.  Do not take naps in the late afternoon.  Try to get at least 30 minutes of sunlight every day. Morning sunlight is best.  Make time to relax before bed. Reading, listening to music, or taking a hot bath promotes quality sleep.  Make your bedroom a place that promotes quality sleep. Keep your bedroom dark, quiet, and at a comfortable room temperature. Make sure your bed is comfortable. Take out sleep distractions like TV, a computer, smartphone, and bright lights.  If you are lying awake in bed for longer than 20 minutes, get up and do a relaxing activity until you feel sleepy.  Work with your health care provider to treat medical conditions that may affect sleeping, such as: ? Nasal obstruction. ? Snoring. ? Sleep apnea and other sleep disorders.  Talk to your health care provider if you think any of your prescription medicines may cause you to have difficulty falling or staying asleep.  If you have sleep problems, talk with a sleep consultant. If you think you have a sleep disorder, talk with your health  care provider about getting evaluated by a specialist. Where to find more information  National Sleep Foundation website: https://sleepfoundation.org  National Heart, Lung, and Blood Institute (NHLBI): www.nhlbi.nih.gov/files/docs/public/sleep/healthy_sleep.pdf  Centers for Disease Control and Prevention (CDC): www.cdc.gov/sleep/index.html Contact a health care provider if you:  Have trouble getting to sleep or staying asleep.  Often wake up very early in the morning and cannot get back to sleep.  Have daytime sleepiness.  Have daytime sleep attacks of suddenly falling asleep and sudden muscle weakness (narcolepsy).  Have a tingling sensation in your legs with a strong urge to move your legs (restless legs syndrome).  Stop breathing briefly during sleep (sleep apnea).  Think you have a sleep disorder or are taking a medicine that is affecting your quality of sleep. Summary  Most adults need 7-8 hours of quality sleep each night.  Getting enough quality sleep is an important part of health and well-being.  Make your bedroom a place that promotes quality sleep and avoid things that may cause you to have poor sleep, such as alcohol, caffeine, smoking, and large meals.  Talk to   your health care provider if you have trouble falling asleep or staying asleep. This information is not intended to replace advice given to you by your health care provider. Make sure you discuss any questions you have with your health care provider. Document Revised: 03/04/2018 Document Reviewed: 03/04/2018 Elsevier Patient Education  2020 Elsevier Inc.  

## 2020-09-11 NOTE — Progress Notes (Signed)
SLEEP MEDICINE CLINIC    Provider:  Larey Seat, MD  Primary Care Physician:  Mosie Lukes, MD [address] [name] [address]     Referring Provider: Jaynee Eagles, MD          Chief Complaint according to patient   Patient presents with:     New Patient (Initial Visit)     pt alone, rm 11. presents with concerns of OSA. never had a SS. knows that she snores, unsure of apnea events. states she averages 7 hrs of sleep. wakes up with morning headache. some daytime somnolence occasionally.      HISTORY OF PRESENT ILLNESS: Amanda James.  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  One of her concerns is that she wakes up and she will then go to the bathroom but it is not the urge to urinate that wakes her up.  She has assumed that she has a low level apnea condition for many years, she knows that she snores.  She wakes up with morning headaches several days a week and sometimes has daytime somnolence.  she is very sensitive to time changes- travelling to the [employer] or just summer daylight savings time.      I have the pleasure of seeing Amanda James today, a right -handed White or Caucasian female with a possible sleep disorder.  She has a  has a past medical history of Allergy, Cervical cancer screening (05/02/2017), Depression, Depression with anxiety, Diabetes mellitus type 2 in obese [facility]), Diabetes mellitus without complication ([place]), History of chicken pox, Hyperlipidemia, Low back pain, Migraines, and Obesity (11/10/2016). never been insulin dependent , with complications, Obesity with BMI under 35.    Sleep relevant medical history: Nocturia/ 4 times,Tonsillectomy no , there have been no dental retainers or braces, no ear tube sinus surgery or  nasal septal surgery.  No history of traumatic injury to the cervical spine, traumatic brain injury, or extensive soft tissue injury.   Family medical /sleep history: No other family member on CPAP with OSA, father died in his early 68s of an acute MI- she was 50 years old.   Social history:  Patient is working as a Merchant navy officer and FP MD, and lives in a household with spouse, has one son.   The patient currently works daytime hours. Has 4 cats.   Tobacco use: never .   ETOH use:;  5 plus / week-   Caffeine intake in form of Coffee( 2 cups ) , rare diet soda. . Regular exercise in form of Yoga and core exercises   Hobbies : gardening,  Arts and crafts & wood working.       Sleep habits are as follows: The patient's dinner time is between 6.30  PM. Main meal is lunch or diner-  The patient goes to bed at 8.30 PM and is asleep 9.30 and continues to sleep for 1-3 Hour- intervals , wakes not for bathroom breaks.   The preferred sleep position is prone or sideways,  with the support of 2 pillows. Dreams are reportedly frequent/vivid/ active.   5.15 AM is the usual rise time.  The patient wakes up spontaneously at 4.30-5 AM  She reports not feeling refreshed or restored  on many mornings  with symptoms such as dry mouth , dull- generalized-morning headaches, and residual fatigue.  Naps are taken infrequently, tris to avoid, but certainly feels tempted.  These naps would be lasting from 1-2 hours and are less refreshing than nocturnal sleep.    Review of Systems: Out of a complete 14 system review, the patient complains of only the following symptoms, and all other reviewed systems are negative.:  Fatigue, sleepiness , snoring, fragmented sleep, headaches,    How likely are you to doze in the following situations: 0 = not likely, 1 = slight chance, 2 = moderate chance, 3 = high chance   Sitting and Reading? Watching Television? Sitting inactive in a public place (theater or meeting)? As a  passenger in a car for an hour without a break? Lying down in the afternoon when circumstances permit? Sitting and talking to someone? Sitting quietly after lunch without alcohol? In a car, while stopped for a few minutes in traffic?   Total = 9/ 24 points   FSS endorsed at 41/ 63 points.   Social History   Socioeconomic History   Marital status: Married    Spouse name: Not on file   Number of children: 1   Years of education: MD   Highest education level: Not on file  Occupational History   Occupation:   Tobacco Use   Smoking status: Never Smoker   Smokeless tobacco: Never Used  Substance and Sexual Activity   Alcohol use: Yes    Alcohol/week: 5.0 standard drinks    Types: 5 Glasses of wine per week    Comment: 3-5 glasses of wine per week   Drug use: No   Sexual activity: Not on file  Other Topics Concern   Not on file  Social History Narrative   Works With [employer], lives with husband, 3 cats   No major dietary restrictions. Exercise 30 minutes 3 x a week   Seat belts routinely   Right-handed   Caffeine: 1-2 cups of coffee per day   Social Determinants of Health   Financial Resource Strain:    Difficulty of Paying Living Expenses: Not on file  Food Insecurity:    Worried About Charity fundraiser in the Last Year: Not on file   [employer] of Food in the Last Year: Not on file  Transportation Needs:    Lack of Transportation (Medical): Not on file   Lack of Transportation (Non-Medical): Not on file  Physical Activity:    Days of Exercise per Week: Not on file   Minutes of Exercise per Session: Not on file  Stress:    Feeling of Stress : Not on file  Social Connections:    Frequency of Communication with Friends and Family: Not on file   Frequency of Social Gatherings with Friends and Family: Not on file   Attends Religious Services: Not on file   Active Member of Clubs or Organizations: Not on file   Attends Archivist  Meetings: Not on file   Marital Status: Not on file    Family History  Problem Relation Age of Onset   Diabetes Mother    Hypertension Mother    Hyperlipidemia Mother    Obesity Mother    Heart disease Mother        pacer   Diabetes Father    Heart disease Father        MI   Hyperlipidemia Father    Hypertension Father  Obesity Father    Diabetes Sister    Hyperlipidemia Sister    Hypertension Sister    Obesity Sister    Obesity Maternal Grandmother    Hypertension Maternal Grandmother    Hyperlipidemia Maternal Grandmother    Diabetes Maternal Grandmother    Rosacea Maternal Grandmother    Non-Hodgkin's lymphoma Maternal Grandmother    Arthritis Maternal Grandmother        spinal stenosis   Multiple myeloma Maternal Grandfather    Hyperlipidemia Paternal Grandmother        rhabdo from statins   Kidney disease Paternal Grandmother    Migraines Neg Hx     Past Medical History:  Diagnosis Date   Allergy    Cervical cancer screening 05/02/2017   Depression    Depression with anxiety    Diabetes mellitus type 2 in obese ([facility])    Diabetes mellitus without complication ([place])    History of chicken pox    Hyperlipidemia    Low back pain    Migraines    Obesity 11/10/2016    Past Surgical History:  Procedure Laterality Date   ABDOMINOPLASTY  2012   AUGMENTATION MAMMAPLASTY     maxilift with implants    BLADDER SUSPENSION  2016   LAPAROSCOPIC GASTRIC BANDING  2005   esophageal dilation. band still present but released   MASTOPEXY  2012     Current Outpatient Medications on File Prior to Visit  Medication Sig Dispense Refill   atorvastatin (LIPITOR) 10 MG tablet Take 1 tablet (10 mg total) by mouth daily. 90 tablet 1   Cetirizine HCl (ZYRTEC ALLERGY) 10 MG CAPS Take 1 capsule by mouth daily.     cyclobenzaprine (FLEXERIL) 10 MG tablet TAKE 1 TABLET (10 MG TOTAL) BY MOUTH 3 (THREE) TIMES DAILY AS NEEDED FOR MUSCLE  SPASMS. 30 tablet 2   Erenumab-aooe (AIMOVIG) 140 MG/ML SOAJ Inject 140 mg into the skin every 30 (thirty) days. 1 pen 5   escitalopram (LEXAPRO) 20 MG tablet Take 1 tablet (20 mg total) by mouth daily. 90 tablet 1   nitrofurantoin, macrocrystal-monohydrate, (MACROBID) 100 MG capsule Take 1 capsule (100 mg total) by mouth 2 (two) times daily as needed. 20 capsule 1   Semaglutide-Weight Management (WEGOVY) 2.4 MG/0.75ML SOAJ Inject 2.4 mg into the skin once a week. 3 mL 5   tretinoin (RETIN-A) 0.025 % cream Apply topically at bedtime. To affected areas 45 g 5   No current facility-administered medications on file prior to visit.    Allergies  Allergen Reactions   Erythromycin Diarrhea    Physical exam:  Today's Vitals   09/11/20 1512  BP: 118/84  Pulse: 79  Weight: 214 lb (97.1 kg)  Height: '5\' 6"'  (1.676 m)   Body mass index is 34.54 kg/m.   Wt Readings from Last 3 Encounters:  09/11/20 214 lb (97.1 kg)  08/11/20 217 lb 3.2 oz (98.5 kg)  07/21/20 215 lb 9.6 oz (97.8 kg)     Ht Readings from Last 3 Encounters:  09/11/20 '5\' 6"'  (1.676 m)  08/11/20 '5\' 6"'  (1.676 m)  07/21/20 '5\' 6"'  (1.676 m)      General: The patient is awake, alert and appears not in acute distress. The patient is well groomed. Head: Normocephalic, atraumatic. Neck is supple. Mallampati 1  neck circumference:16 inches . Nasal airflow  patent.  Retrognathia is not seen, but a crowded lower jaw. .  Dental status: intact  Cardiovascular:  Regular rate and cardiac rhythm by pulse,  without distended neck veins. Respiratory: Lungs are clear to auscultation.  Skin:  Without evidence of ankle edema, or rash. Trunk: The patient's posture is erect.   Neurologic exam : The patient is awake and alert, oriented to place and time.   Memory subjective described as intact.  Attention span & concentration ability appears normal.  Speech is fluent,  without  dysarthria, dysphonia or aphasia.  Mood and affect are  appropriate.   Cranial nerves: no loss of smell or taste reported  Pupils are equal and briskly reactive to light. Funduscopic exam deferred. .  Extraocular movements in vertical and horizontal planes were intact and without nystagmus. No Diplopia. Visual fields by finger perimetry are intact. Hearing was intact to soft voice and finger rubbing.   Facial sensation intact to fine touch. Facial motor strength is symmetric and tongue / uvula move midline. Smooth forehead.  Neck ROM : rotation, tilt and flexion extension were normal for age and shoulder shrug was symmetrical.    Motor exam:  Symmetric bulk, tone and ROM.   Normal tone without cog wheeling, symmetric grip strength . Sensory:  Fine touch, pinprick and vibration were tested  and  normal.  Proprioception tested in the upper extremities was normal. Coordination: Rapid alternating movements in the fingers/hands were of normal speed. No changes in penmanship.  The Finger-to-nose maneuver was intact without evidence of ataxia, dysmetria or tremor. Gait and station: Patient could rise unassisted from a seated position, walked without assistive device.  Stance is of normal width/ base and the patient turned with 3 steps.  Toe and heel walk were intact.  Deep tendon reflexes: in the  upper and lower extremities are symmetric and intact.  Babinski response was deferred.      After spending a total time of  45 minutes face to face and additional time for physical and neurologic examination, review of laboratory studies,  personal review of imaging studies, reports and results of other testing and review of referral information / records as far as provided in visit, I have established the following assessments:  1)  Chronic migraine with AM headaches, dull- , and usually self resolving- no photophobia and no nausea associated.   2) sleep apnea risk factors are weight , neck size, lower jaw was crowded before wisdom teeth were removed.    3)  Allergic rhinitis, including allergies to cats, non seasonal.    My Plan is to proceed with:  1)  OSA evaluation by in lab or HST.     I would like to thank Mosie Lukes, MD and Mosie Lukes, Md [address] [employer],  [place] [address] for allowing me to meet with and to take care of this pleasant patient.   In short, Amanda James is presenting with insufficient sleep, non restorative sleep, nocturia, and snoring - all symptoms that can be attributed to OSA.    I plan to follow up either personally within 2-3  month.   CC: I will share my notes with PCP and Dr Jaynee Eagles, MD   Electronically signed by: Larey Seat, MD 09/11/2020 3:31 PM  [facility] Sleep Board certified by The [employer] of Sleep Medicine and Diplomate of the [employer] of Sleep Medicine. Board certified In Neurology through the [place], Fellow of the [employer] of Neurology. Medical Director of [employer].

## 2020-09-12 MED FILL — UNIFINE PENTIPS 32GX5/32: 32G X 4 MM | 90 days supply | Qty: 100 | Fill #1

## 2020-09-13 ENCOUNTER — Ambulatory Visit (INDEPENDENT_AMBULATORY_CARE_PROVIDER_SITE_OTHER): Payer: 59

## 2020-09-13 DIAGNOSIS — G453 Amaurosis fugax: Secondary | ICD-10-CM

## 2020-09-13 DIAGNOSIS — H93A2 Pulsatile tinnitus, left ear: Secondary | ICD-10-CM

## 2020-09-13 DIAGNOSIS — H5462 Unqualified visual loss, left eye, normal vision right eye: Secondary | ICD-10-CM | POA: Diagnosis not present

## 2020-09-13 DIAGNOSIS — H5460 Unqualified visual loss, one eye, unspecified: Secondary | ICD-10-CM

## 2020-09-13 DIAGNOSIS — I7771 Dissection of carotid artery: Secondary | ICD-10-CM

## 2020-09-13 DIAGNOSIS — R51 Headache with orthostatic component, not elsewhere classified: Secondary | ICD-10-CM | POA: Diagnosis not present

## 2020-09-13 DIAGNOSIS — R519 Headache, unspecified: Secondary | ICD-10-CM

## 2020-09-13 MED ORDER — GADOBENATE DIMEGLUMINE 529 MG/ML IV SOLN: 20.0000 mL | Freq: Once | INTRAVENOUS | Status: AC | PRN

## 2020-09-13 MED ORDER — GADOBENATE DIMEGLUMINE 529 MG/ML IV SOLN
20.0000 mL | Freq: Once | INTRAVENOUS | Status: AC | PRN
  Administered 2020-09-13: 20 mL via INTRAVENOUS

## 2020-09-13 MED FILL — SAXENDA 18 MG/3 ML PEN: 18 | 30 days supply | Qty: 15 | Fill #4

## 2020-09-14 ENCOUNTER — Telehealth: Payer: Self-pay

## 2020-09-14 NOTE — Telephone Encounter (Signed)
LVM to schedule in-lab overnight sleep study

## 2020-09-26 MED FILL — WEGOVY 2.4 MG/0.75ML SOAJ: 2.4 | 28 days supply | Qty: 3 | Fill #1

## 2020-10-03 ENCOUNTER — Telehealth: Payer: Self-pay

## 2020-10-03 ENCOUNTER — Other Ambulatory Visit: Payer: Self-pay | Admitting: Family Medicine

## 2020-10-03 DIAGNOSIS — M545 Low back pain, unspecified: Secondary | ICD-10-CM

## 2020-10-03 DIAGNOSIS — G8929 Other chronic pain: Secondary | ICD-10-CM

## 2020-10-03 MED FILL — CYCLOBENZAPRINE HCL 10 MG T: 10 | 10 days supply | Qty: 30 | Fill #0

## 2020-10-03 MED FILL — NITROFURANTOIN MONO-MCR 100: 100 | 10 days supply | Qty: 20 | Fill #1

## 2020-10-03 NOTE — Telephone Encounter (Signed)
We have attempted to call the patient two times to schedule sleep study. We also sent a MyChart message to reach patient about scheduling sleep study.  Patient has been unavailable at the phone numbers we have on file and has not returned our calls.  If patient calls back we will schedule them for their sleep study.

## 2020-10-04 ENCOUNTER — Other Ambulatory Visit: Payer: Self-pay | Admitting: Neurology

## 2020-10-04 MED ORDER — UBRELVY 100 MG PO TABS: 100.0000 mg | ORAL_TABLET | ORAL | 11 refills | Status: DC | PRN

## 2020-10-09 ENCOUNTER — Encounter: Payer: Self-pay | Admitting: Family Medicine

## 2020-10-09 DIAGNOSIS — M545 Low back pain, unspecified: Secondary | ICD-10-CM

## 2020-10-09 DIAGNOSIS — G8929 Other chronic pain: Secondary | ICD-10-CM

## 2020-10-09 DIAGNOSIS — M4306 Spondylolysis, lumbar region: Secondary | ICD-10-CM

## 2020-10-11 MED FILL — CYCLOBENZAPRINE HCL 10 MG T: 10 | 10 days supply | Qty: 30 | Fill #1

## 2020-10-13 ENCOUNTER — Telehealth (INDEPENDENT_AMBULATORY_CARE_PROVIDER_SITE_OTHER): Payer: 59 | Admitting: Family Medicine

## 2020-10-13 ENCOUNTER — Other Ambulatory Visit: Payer: Self-pay | Admitting: Family Medicine

## 2020-10-13 ENCOUNTER — Other Ambulatory Visit: Payer: Self-pay

## 2020-10-13 ENCOUNTER — Encounter: Payer: Self-pay | Admitting: Family Medicine

## 2020-10-13 DIAGNOSIS — N951 Menopausal and female climacteric states: Secondary | ICD-10-CM | POA: Diagnosis not present

## 2020-10-13 DIAGNOSIS — E559 Vitamin D deficiency, unspecified: Secondary | ICD-10-CM | POA: Diagnosis not present

## 2020-10-13 DIAGNOSIS — E6609 Other obesity due to excess calories: Secondary | ICD-10-CM

## 2020-10-13 DIAGNOSIS — Z6834 Body mass index (BMI) 34.0-34.9, adult: Secondary | ICD-10-CM

## 2020-10-13 DIAGNOSIS — L719 Rosacea, unspecified: Secondary | ICD-10-CM | POA: Diagnosis not present

## 2020-10-13 DIAGNOSIS — R0683 Snoring: Secondary | ICD-10-CM

## 2020-10-13 DIAGNOSIS — G43709 Chronic migraine without aura, not intractable, without status migrainosus: Secondary | ICD-10-CM | POA: Diagnosis not present

## 2020-10-13 DIAGNOSIS — F32A Depression, unspecified: Secondary | ICD-10-CM

## 2020-10-13 DIAGNOSIS — E782 Mixed hyperlipidemia: Secondary | ICD-10-CM | POA: Diagnosis not present

## 2020-10-13 DIAGNOSIS — E669 Obesity, unspecified: Secondary | ICD-10-CM

## 2020-10-13 DIAGNOSIS — E1169 Type 2 diabetes mellitus with other specified complication: Secondary | ICD-10-CM | POA: Diagnosis not present

## 2020-10-13 DIAGNOSIS — F419 Anxiety disorder, unspecified: Secondary | ICD-10-CM

## 2020-10-13 MED ORDER — ESCITALOPRAM OXALATE 10 MG PO TABS: 10.0000 mg | ORAL_TABLET | Freq: Every evening | ORAL | 1 refills | Status: DC

## 2020-10-13 MED ORDER — ESCITALOPRAM OXALATE 20 MG PO TABS
20.0000 mg | ORAL_TABLET | Freq: Every morning | ORAL | 1 refills | Status: DC
Start: 2020-10-13 — End: 2021-02-06

## 2020-10-13 MED ORDER — BRIMONIDINE TARTRATE 0.33 % EX GEL: 1.0000 | Freq: Every day | CUTANEOUS | 3 refills | Status: DC

## 2020-10-13 MED FILL — ESCITALOPRAM 10 MG TABLET: 10 | 90 days supply | Qty: 90 | Fill #0

## 2020-10-13 MED FILL — UBRELVY 100 MG TABS: 100 | 30 days supply | Qty: 8 | Fill #0

## 2020-10-13 NOTE — Assessment & Plan Note (Signed)
Has done well on Lexapro 20 mg for years but feels she needs more support presently, notes anhedonia and anxiety. Will continu 20 mg in am and add 10 mg in pm.

## 2020-10-13 NOTE — Assessment & Plan Note (Signed)
Tolerating statin, encouraged heart healthy diet, avoid trans fats, minimize simple carbs and saturated fats. Increase exercise as tolerated 

## 2020-10-13 NOTE — Assessment & Plan Note (Signed)
Is noting a flare as perimenopause takes hold. Has used Brimonidine gel in past. Is given a prescription to see if it helps.

## 2020-10-13 NOTE — Assessment & Plan Note (Signed)
hgba1c acceptable, minimize simple carbs. Increase exercise as tolerated. Continue current meds 

## 2020-10-13 NOTE — Assessment & Plan Note (Signed)
Is tolerating Wegovy and has lost about 5 # since starting it.

## 2020-10-13 NOTE — Assessment & Plan Note (Signed)
She has a sleep study scheduled for next week

## 2020-10-13 NOTE — Assessment & Plan Note (Signed)
No cycle for several months, encouraged to minimize simple carbohydrates, caffeine. Hydrate well, minimize stress, get adequate sleep and exercise

## 2020-10-13 NOTE — Assessment & Plan Note (Signed)
Supplement and monitor 

## 2020-10-13 NOTE — Assessment & Plan Note (Signed)
Doing better with recent treatments with neurology.

## 2020-10-13 NOTE — Patient Instructions (Signed)
Follow  up in January

## 2020-10-13 NOTE — Progress Notes (Addendum)
Virtual Visit via Video Note  I connected with [name] on 10/13/20 at  9:00 AM EDT by a video enabled telemedicine application and verified that I am speaking with the correct person using two identifiers.  Location: Patient: home, patient and provider in visit Provider: home   I discussed the limitations of evaluation and management by telemedicine and the availability of in person appointments. The patient expressed understanding and agreed to proceed. A [name], CMA was able to get the patient in the video visit     Subjective:    Patient ID: Amanda James, female    DOB: Jan 06, 1971, 49 y.o.   MRN: [id number]  Chief Complaint  Patient presents with  . Follow-up    Pt would like to discuss her rosea and medication change.    HPI Patient is in today for follow up on chronic medical concerns. No recent febrile illness or hospitalizations. She has noted increased anxiety and anhedonia with increased stress at work. She is interested in increasing her Lexapro. She notes she has not had a period in months and is noting some mild hot flashes and some return of rosacea which has not bothered her in years. She has a hot flushed face on occasion. The [facility] has been helping her wight loss even though it has been hard to stay on a heart healthy diet. Denies CP/palp/SOB/HA/congestion/fevers/GI or GU c/o. Taking meds as prescribed. Her pain has improved some with an epidural and she is scheduled for another one soon.   Past Medical History:  Diagnosis Date  . Allergy   . Cervical cancer screening 05/02/2017  . Depression   . Depression with anxiety   . Diabetes mellitus type 2 in obese ([place])   . Diabetes mellitus without complication ([place])   . History of chicken pox   . Hyperlipidemia   . Low back pain   . Migraines   . Obesity 11/10/2016    Past Surgical History:  Procedure Laterality Date  . ABDOMINOPLASTY  2012  . AUGMENTATION MAMMAPLASTY     maxilift with implants   .  BLADDER SUSPENSION  2016  . LAPAROSCOPIC GASTRIC BANDING  2005   esophageal dilation. band still present but released  . MASTOPEXY  2012    Family History  Problem Relation Age of Onset  . Diabetes Mother   . Hypertension Mother   . Hyperlipidemia Mother   . Obesity Mother   . Heart disease Mother        pacer  . Diabetes Father   . Heart disease Father        MI  . Hyperlipidemia Father   . Hypertension Father   . Obesity Father   . Diabetes Sister   . Hyperlipidemia Sister   . Hypertension Sister   . Obesity Sister   . Obesity Maternal Grandmother   . Hypertension Maternal Grandmother   . Hyperlipidemia Maternal Grandmother   . Diabetes Maternal Grandmother   . Rosacea Maternal Grandmother   . Non-Hodgkin's lymphoma Maternal Grandmother   . Arthritis Maternal Grandmother        spinal stenosis  . Multiple myeloma Maternal Grandfather   . Hyperlipidemia Paternal Grandmother        rhabdo from statins  . Kidney disease Paternal Grandmother   . Migraines Neg Hx     Social History   Socioeconomic History  . Marital status: Married    Spouse name: Not on file  . Number of children: 1  . Years  of education: MD  . Highest education level: Not on file  Occupational History  . Occupation: [employer]  Tobacco Use  . Smoking status: Never Smoker  . Smokeless tobacco: Never Used  Substance and Sexual Activity  . Alcohol use: Yes    Alcohol/week: 5.0 standard drinks    Types: 5 Glasses of wine per week    Comment: 3-5 glasses of wine per week  . Drug use: No  . Sexual activity: Not on file  Other Topics Concern  . Not on file  Social History Narrative   Works With [employer], lives with husband, 3 cats   No major dietary restrictions. Exercise 30 minutes 3 x a week   Seat belts routinely   Right-handed   Caffeine: 1-2 cups of coffee per day   Social Determinants of Health   Financial Resource Strain:   . Difficulty of Paying Living Expenses: Not on file  Food  Insecurity:   . Worried About Charity fundraiser in the Last Year: Not on file  . Ran Out of Food in the Last Year: Not on file  Transportation Needs:   . Lack of Transportation (Medical): Not on file  . Lack of Transportation (Non-Medical): Not on file  Physical Activity:   . Days of Exercise per Week: Not on file  . Minutes of Exercise per Session: Not on file  Stress:   . Feeling of Stress : Not on file  Social Connections:   . Frequency of Communication with Friends and Family: Not on file  . Frequency of Social Gatherings with Friends and Family: Not on file  . Attends Religious Services: Not on file  . Active Member of Clubs or Organizations: Not on file  . Attends Archivist Meetings: Not on file  . Marital Status: Not on file  Intimate Partner Violence:   . Fear of Current or Ex-Partner: Not on file  . Emotionally Abused: Not on file  . Physically Abused: Not on file  . Sexually Abused: Not on file    Outpatient Medications Prior to Visit  Medication Sig Dispense Refill  . atorvastatin (LIPITOR) 10 MG tablet Take 1 tablet (10 mg total) by mouth daily. 90 tablet 1  . Cetirizine HCl (ZYRTEC ALLERGY) 10 MG CAPS Take 1 capsule by mouth daily.    Eduard Roux (AIMOVIG) 140 MG/ML SOAJ Inject 140 mg into the skin every 30 (thirty) days. 1 pen 5  . nitrofurantoin, macrocrystal-monohydrate, (MACROBID) 100 MG capsule Take 1 capsule (100 mg total) by mouth 2 (two) times daily as needed. 20 capsule 1  . Semaglutide-Weight Management (WEGOVY) 2.4 MG/0.75ML SOAJ Inject 2.4 mg into the skin once a week. 3 mL 5  . tretinoin (RETIN-A) 0.025 % cream Apply topically at bedtime. To affected areas 45 g 5  . Ubrogepant (UBRELVY) 100 MG TABS Take 100 mg by mouth every 2 (two) hours as needed. Maximum 241m a day. 8 tablet 11  . escitalopram (LEXAPRO) 20 MG tablet Take 1 tablet (20 mg total) by mouth daily. 90 tablet 1  . cyclobenzaprine (FLEXERIL) 10 MG tablet TAKE 1 TABLET BY MOUTH  3 TIMES DAILY AS NEEDED FOR MUSCLE SPASMS 30 tablet 2   Facility-Administered Medications Prior to Visit  Medication Dose Route Frequency Provider Last Rate Last Admin  . gadobenate dimeglumine (MULTIHANCE) injection 20 mL  20 mL Intravenous Once PRN AMelvenia Beam MD        Allergies  Allergen Reactions  . Erythromycin Diarrhea  Review of Systems  Constitutional: Negative for fever and malaise/fatigue.  HENT: Negative for congestion.   Eyes: Negative for blurred vision.  Respiratory: Negative for shortness of breath.   Cardiovascular: Negative for chest pain, palpitations and leg swelling.  Gastrointestinal: Negative for abdominal pain, blood in stool and nausea.  Genitourinary: Negative for dysuria and frequency.  Musculoskeletal: Positive for back pain and joint pain. Negative for falls.  Skin: Positive for rash.  Neurological: Negative for dizziness, loss of consciousness and headaches.  Endo/Heme/Allergies: Negative for environmental allergies.  Psychiatric/Behavioral: Positive for depression. The patient is nervous/anxious.        Objective:    Physical Exam Vitals and nursing note reviewed.  Constitutional:      General: She is not in acute distress.    Appearance: She is well-developed.  HENT:     Head: Normocephalic and atraumatic.     Nose: Nose normal.  Eyes:     General:        Right eye: No discharge.        Left eye: No discharge.  Cardiovascular:     Rate and Rhythm: Normal rate and regular rhythm.     Heart sounds: No murmur heard.   Pulmonary:     Effort: Pulmonary effort is normal.     Breath sounds: Normal breath sounds.  Abdominal:     General: Bowel sounds are normal.     Palpations: Abdomen is soft.     Tenderness: There is no abdominal tenderness.  Musculoskeletal:     Cervical back: Normal range of motion and neck supple.  Skin:    General: Skin is warm and dry.  Neurological:     Mental Status: She is alert and oriented to  person, place, and time.     There were no vitals taken for this visit. Wt Readings from Last 3 Encounters:  09/11/20 214 lb (97.1 kg)  08/11/20 217 lb 3.2 oz (98.5 kg)  07/21/20 215 lb 9.6 oz (97.8 kg)    Diabetic Foot Exam - Simple   No data filed     Lab Results  Component Value Date   WBC 8.5 09/10/2019   HGB 13.4 09/10/2019   HCT 40.5 09/10/2019   PLT 294.0 09/10/2019   GLUCOSE 89 09/10/2019   CHOL 166 09/10/2019   TRIG 92.0 09/10/2019   HDL 52.80 09/10/2019   LDLCALC 95 09/10/2019   ALT 18 09/10/2019   AST 17 09/10/2019   NA 137 09/10/2019   K 4.8 09/10/2019   CL 103 09/10/2019   CREATININE 0.82 09/10/2019   BUN 14 09/10/2019   CO2 25 09/10/2019   TSH 2.73 09/10/2019   INR 1.04 09/17/2016   HGBA1C 5.5 09/10/2019   MICROALBUR <0.7 09/10/2019    Lab Results  Component Value Date   TSH 2.73 09/10/2019   Lab Results  Component Value Date   WBC 8.5 09/10/2019   HGB 13.4 09/10/2019   HCT 40.5 09/10/2019   MCV 96.8 09/10/2019   PLT 294.0 09/10/2019   Lab Results  Component Value Date   NA 137 09/10/2019   K 4.8 09/10/2019   CO2 25 09/10/2019   GLUCOSE 89 09/10/2019   BUN 14 09/10/2019   CREATININE 0.82 09/10/2019   BILITOT 0.8 09/10/2019   ALKPHOS 80 09/10/2019   AST 17 09/10/2019   ALT 18 09/10/2019   PROT 7.2 09/10/2019   ALBUMIN 4.3 09/10/2019   CALCIUM 9.4 09/10/2019   ANIONGAP 5 09/17/2016   GFR 74.06  09/10/2019   Lab Results  Component Value Date   CHOL 166 09/10/2019   Lab Results  Component Value Date   HDL 52.80 09/10/2019   Lab Results  Component Value Date   LDLCALC 95 09/10/2019   Lab Results  Component Value Date   TRIG 92.0 09/10/2019   Lab Results  Component Value Date   CHOLHDL 3 09/10/2019   Lab Results  Component Value Date   HGBA1C 5.5 09/10/2019       Assessment & Plan:   Problem List Items Addressed This Visit    Diabetes mellitus type 2 in obese ([place])    hgba1c acceptable, minimize simple carbs.  Increase exercise as tolerated. Continue current meds      Hyperlipidemia    Tolerating statin, encouraged heart healthy diet, avoid trans fats, minimize simple carbs and saturated fats. Increase exercise as tolerated      Vitamin D deficiency    Supplement and monitor      Chronic migraine w/o aura w/o status migrainosus, not intractable    Doing better with recent treatments with neurology.      Relevant Medications   escitalopram (LEXAPRO) 10 MG tablet   escitalopram (LEXAPRO) 20 MG tablet   Class 1 obesity due to excess calories with serious comorbidity and body mass index (BMI) of 34.0 to 34.9 in adult    Is tolerating Wegovy and has lost about 5 # since starting it.       Loud snoring    She has a sleep study scheduled for next week      Rosacea    Is noting a flare as perimenopause takes hold. Has used Brimonidine gel in past. Is given a prescription to see if it helps.       Perimenopause    No cycle for several months, encouraged to minimize simple carbohydrates, caffeine. Hydrate well, minimize stress, get adequate sleep and exercise         I have changed Dr. Starlyn Skeans "Dr. Migdalia Dk escitalopram. I am also having her start on escitalopram and Brimonidine Tartrate. Additionally, I am having her maintain her Cetirizine HCl, Erenumab-aooe, tretinoin, nitrofurantoin (macrocrystal-monohydrate), atorvastatin, Wegovy, cyclobenzaprine, and Ubrelvy.  Meds ordered this encounter  Medications  . escitalopram (LEXAPRO) 10 MG tablet    Sig: Take 1 tablet (10 mg total) by mouth every evening.    Dispense:  90 tablet    Refill:  1  . escitalopram (LEXAPRO) 20 MG tablet    Sig: Take 1 tablet (20 mg total) by mouth in the morning.    Dispense:  90 tablet    Refill:  1  . Brimonidine Tartrate 0.33 % GEL    Sig: Apply 1 Dose topically daily.    Dispense:  30 g    Refill:  3      I discussed the assessment and treatment plan with the patient. The patient was  provided an opportunity to ask questions and all were answered. The patient agreed with the plan and demonstrated an understanding of the instructions.   The patient was advised to call back or seek an in-person evaluation if the symptoms worsen or if the condition fails to improve as anticipated.  I provided 20 minutes of face-to-face time during this encounter.   Penni Homans, MD

## 2020-10-14 MED FILL — MIRVASO 0.33% GEL: 0.33 | 30 days supply | Qty: 30 | Fill #0

## 2020-10-19 MED FILL — CYCLOBENZAPRINE HCL 10 MG T: 10 | 10 days supply | Qty: 30 | Fill #2

## 2020-10-20 ENCOUNTER — Ambulatory Visit
Admission: RE | Admit: 2020-10-20 | Discharge: 2020-10-20 | Disposition: A | Discharge status: Home / Self Care | Payer: 59 | Source: Ambulatory Visit | Attending: Family Medicine | Admitting: Family Medicine

## 2020-10-20 ENCOUNTER — Other Ambulatory Visit: Payer: Self-pay

## 2020-10-20 DIAGNOSIS — M5126 Other intervertebral disc displacement, lumbar region: Secondary | ICD-10-CM | POA: Diagnosis not present

## 2020-10-20 DIAGNOSIS — M4306 Spondylolysis, lumbar region: Secondary | ICD-10-CM

## 2020-10-20 DIAGNOSIS — G8929 Other chronic pain: Secondary | ICD-10-CM

## 2020-10-20 MED ORDER — METHYLPREDNISOLONE ACETATE 40 MG/ML INJ SUSP (RADIOLOG
120.0000 mg | Freq: Once | INTRAMUSCULAR | Status: AC
  Administered 2020-10-20: 120 mg via EPIDURAL

## 2020-10-20 MED ORDER — IOPAMIDOL (ISOVUE-M 200) INJECTION 41%
1.0000 mL | Freq: Once | INTRAMUSCULAR | Status: AC
  Administered 2020-10-20: 1 mL via EPIDURAL

## 2020-10-20 NOTE — Discharge Instructions (Signed)

## 2020-10-24 ENCOUNTER — Encounter: Payer: Self-pay | Admitting: Family Medicine

## 2020-10-24 ENCOUNTER — Telehealth: Payer: Self-pay

## 2020-10-24 NOTE — Telephone Encounter (Signed)
PA initiated via Covermymeds; KEY: [id number]. Awaiting determination.

## 2020-10-25 MED FILL — WEGOVY 2.4 MG/0.75ML SOAJ: 2.4 | 28 days supply | Qty: 3 | Fill #2

## 2020-10-26 ENCOUNTER — Other Ambulatory Visit: Payer: Self-pay

## 2020-10-26 ENCOUNTER — Ambulatory Visit (HOSPITAL_COMMUNITY): Payer: 59 | Attending: Cardiovascular Disease

## 2020-10-26 DIAGNOSIS — G459 Transient cerebral ischemic attack, unspecified: Secondary | ICD-10-CM | POA: Diagnosis not present

## 2020-10-26 DIAGNOSIS — G453 Amaurosis fugax: Secondary | ICD-10-CM | POA: Insufficient documentation

## 2020-10-26 LAB — ECHOCARDIOGRAM COMPLETE BUBBLE STUDY
Area-P 1/2: 2.03 cm2
S' Lateral: 3.1 cm

## 2020-10-26 MED ORDER — PERFLUTREN LIPID MICROSPHERE
1.0000 mL | INTRAVENOUS | Status: AC | PRN
  Administered 2020-10-26: 1 mL via INTRAVENOUS

## 2020-10-26 MED ORDER — SODIUM CHLORIDE 0.9% FLUSH
10.0000 mL | INTRAVENOUS | Status: AC | PRN
  Administered 2020-10-26: 20 mL via INTRAVENOUS

## 2020-10-27 ENCOUNTER — Ambulatory Visit (INDEPENDENT_AMBULATORY_CARE_PROVIDER_SITE_OTHER): Payer: 59 | Admitting: Neurology

## 2020-10-27 DIAGNOSIS — G4761 Periodic limb movement disorder: Secondary | ICD-10-CM | POA: Diagnosis not present

## 2020-10-27 DIAGNOSIS — R519 Headache, unspecified: Secondary | ICD-10-CM

## 2020-10-27 DIAGNOSIS — E6609 Other obesity due to excess calories: Secondary | ICD-10-CM

## 2020-10-27 DIAGNOSIS — R0683 Snoring: Secondary | ICD-10-CM

## 2020-10-27 DIAGNOSIS — G43709 Chronic migraine without aura, not intractable, without status migrainosus: Secondary | ICD-10-CM

## 2020-10-27 NOTE — Telephone Encounter (Signed)
PA approved.  The request has been approved. The authorization is effective for a maximum of 6 fills from 10/26/2020 to 04/24/2021, as long as the member is enrolled in their current health plan. The request was approved as submitted. This request was approved for 83mL's per 28 days. Additional prior authorizations (PA) have been entered NOB:[id number] 0.25mg / 0.2mL for a maximum of 6 fills with a quantity limit of qty 32mL per 28 days (PA 3131).Wegovy 0.5mg / 0.70mL for a maximum of 6 fills with a quantity limit of qty 73mL per 28 days (PA 3132). Wegovy 1mg / 0.89mL for a maximum of 6 fills with a quantity limit of qty 9mL per 28 days (PA 3133). Wegovy 1.7mg / 0.50mL for a maximum of 6 fills with a quantity limit of qty 4mL per 28 days (PA 3134). effective 10/26/2020 through 04/24/2021.Renewal for [facility] requires that the patient has achieved or maintained at least 5% weight loss from baseline. A written notification letter will follow with additional details.

## 2020-10-28 ENCOUNTER — Other Ambulatory Visit: Payer: Self-pay

## 2020-10-28 ENCOUNTER — Other Ambulatory Visit: Payer: Self-pay | Admitting: Family Medicine

## 2020-10-28 DIAGNOSIS — G8929 Other chronic pain: Secondary | ICD-10-CM

## 2020-10-30 ENCOUNTER — Telehealth: Payer: Self-pay | Admitting: *Deleted

## 2020-10-30 ENCOUNTER — Other Ambulatory Visit: Payer: Self-pay | Admitting: *Deleted

## 2020-10-30 DIAGNOSIS — Q2112 Patent foramen ovale: Secondary | ICD-10-CM

## 2020-10-30 DIAGNOSIS — Q211 Atrial septal defect: Secondary | ICD-10-CM

## 2020-10-30 MED FILL — CYCLOBENZAPRINE HCL 10 MG T: 10 | 10 days supply | Qty: 30 | Fill #0

## 2020-10-30 NOTE — Telephone Encounter (Signed)
I called the pt and LVM (ok per DPR) advising of findings on echo which show concerns of PFO. I advised of the risk of stroke or other problems if it's large. Therefore Dr Lucia Gaskins would like to send the pt to Dr Excell Seltzer with cardiology for further evaluation of this (I.e. TEE) and see what his recommendations are. I asked the pt for a message back in [place] or a call to confirm receipt of the message and let us know if she is ok with referral to Dr Excell Seltzer.

## 2020-10-30 NOTE — Telephone Encounter (Signed)
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 25% of the population has this but it can be a stroke risk factor or cause other symptoms if large. I would like to send you to Dr. Excell Seltzer in cardiology for further evaluation and TEE (Transesophageal echocardiogram)and further recommendations. I will have my nurse call you to discuss thank you. Dr. Lucia Gaskins

## 2020-10-31 ENCOUNTER — Other Ambulatory Visit: Payer: Self-pay | Admitting: Neurology

## 2020-10-31 DIAGNOSIS — Q211 Atrial septal defect: Secondary | ICD-10-CM

## 2020-10-31 DIAGNOSIS — Q2112 Patent foramen ovale: Secondary | ICD-10-CM

## 2020-11-06 ENCOUNTER — Telehealth: Payer: Self-pay

## 2020-11-06 NOTE — Telephone Encounter (Signed)
Left message to call back to arrange visit with Dr. Excell Seltzer.

## 2020-11-06 NOTE — Telephone Encounter (Signed)
-----   Message from Judson Roch sent at 10/31/2020  4:54 PM EST ----- Regarding: Dr Excell Seltzer referral Good Afternoon, pt has a referral for PFO. Can you arrange an appt? Ty!

## 2020-11-07 MED FILL — CYCLOBENZAPRINE HCL 10 MG T: 10 | 10 days supply | Qty: 30 | Fill #1

## 2020-11-07 MED FILL — MIRVASO 0.33% GEL PUMP: 0.33 | 30 days supply | Qty: 30 | Fill #1

## 2020-11-07 MED FILL — UBRELVY 100 MG TABS: 100 | 30 days supply | Qty: 8 | Fill #1

## 2020-11-08 MED FILL — ESCITALOPRAM 20 MG TABLET: 20 | 90 days supply | Qty: 90 | Fill #1

## 2020-11-08 MED FILL — ATORVASTATIN CALCIUM 10 MG: 10 | 90 days supply | Qty: 90 | Fill #1

## 2020-11-10 ENCOUNTER — Other Ambulatory Visit: Payer: Self-pay | Admitting: Neurology

## 2020-11-10 ENCOUNTER — Other Ambulatory Visit (INDEPENDENT_AMBULATORY_CARE_PROVIDER_SITE_OTHER): Payer: 59

## 2020-11-10 DIAGNOSIS — G8929 Other chronic pain: Secondary | ICD-10-CM

## 2020-11-10 DIAGNOSIS — Z79899 Other long term (current) drug therapy: Secondary | ICD-10-CM

## 2020-11-10 DIAGNOSIS — E1169 Type 2 diabetes mellitus with other specified complication: Secondary | ICD-10-CM

## 2020-11-10 DIAGNOSIS — G4761 Periodic limb movement disorder: Secondary | ICD-10-CM

## 2020-11-10 DIAGNOSIS — G959 Disease of spinal cord, unspecified: Secondary | ICD-10-CM

## 2020-11-10 DIAGNOSIS — M545 Low back pain, unspecified: Secondary | ICD-10-CM | POA: Diagnosis not present

## 2020-11-10 DIAGNOSIS — E669 Obesity, unspecified: Secondary | ICD-10-CM

## 2020-11-10 DIAGNOSIS — E782 Mixed hyperlipidemia: Secondary | ICD-10-CM

## 2020-11-10 DIAGNOSIS — E559 Vitamin D deficiency, unspecified: Secondary | ICD-10-CM | POA: Diagnosis not present

## 2020-11-10 DIAGNOSIS — G379 Demyelinating disease of central nervous system, unspecified: Secondary | ICD-10-CM

## 2020-11-10 DIAGNOSIS — G629 Polyneuropathy, unspecified: Secondary | ICD-10-CM

## 2020-11-10 DIAGNOSIS — R259 Unspecified abnormal involuntary movements: Secondary | ICD-10-CM

## 2020-11-10 LAB — COMPREHENSIVE METABOLIC PANEL
ALT: 29 U/L (ref 0–35)
AST: 20 U/L (ref 0–37)
Albumin: 3.9 g/dL (ref 3.5–5.2)
Alkaline Phosphatase: 60 U/L (ref 39–117)
BUN: 16 mg/dL (ref 6–23)
CO2: 26 mEq/L (ref 19–32)
Calcium: 9.3 mg/dL (ref 8.4–10.5)
Chloride: 104 mEq/L (ref 96–112)
Creatinine, Ser: 0.85 mg/dL (ref 0.40–1.20)
GFR: 79.96 mL/min (ref >60.00)
Glucose, Bld: 90 mg/dL (ref 70–99)
Potassium: 4.7 mEq/L (ref 3.5–5.1)
Sodium: 137 mEq/L (ref 135–145)
Total Bilirubin: 1.6 mg/dL — ABNORMAL HIGH (ref 0.2–1.2)
Total Protein: 6.8 g/dL (ref 6.0–8.3)

## 2020-11-10 LAB — LIPID PANEL
Cholesterol: 162 mg/dL (ref 0–200)
HDL: 65 mg/dL (ref >39.00)
LDL Cholesterol: 77 mg/dL (ref 0–99)
NonHDL: 96.56
Total CHOL/HDL Ratio: 2
Triglycerides: 100 mg/dL (ref 0.0–149.0)
VLDL: 20 mg/dL (ref 0.0–40.0)

## 2020-11-10 LAB — VITAMIN D 25 HYDROXY (VIT D DEFICIENCY, FRACTURES): VITD: 38.57 ng/mL (ref 30.00–100.00)

## 2020-11-10 LAB — CBC
HCT: 39.7 % (ref 36.0–46.0)
Hemoglobin: 13.5 g/dL (ref 12.0–15.0)
MCHC: 34.1 g/dL (ref 30.0–36.0)
MCV: 95.5 fl (ref 78.0–100.0)
Platelets: 257 10*3/uL (ref 150.0–400.0)
RBC: 4.16 Mil/uL (ref 3.87–5.11)
RDW: 14.6 % (ref 11.5–15.5)
WBC: 8 10*3/uL (ref 4.0–10.5)

## 2020-11-10 LAB — HEMOGLOBIN A1C: Hgb A1c MFr Bld: 5.3 % (ref 4.6–6.5)

## 2020-11-10 LAB — TSH: TSH: 2.83 u[IU]/mL (ref 0.35–4.50)

## 2020-11-10 NOTE — Progress Notes (Signed)
The arousals were noted as: 26 were spontaneous, 119 were associated with PLMs, 0 were associated with respiratory events. The patient had a total of 332 Periodic Limb Movements.  The Periodic Limb Movement (PLM) Arousal index was 19.5/hour. Audio and video analysis did not show any complex behaviors, phonations or vocalizations. There was only soft snoring.   EKG was in keeping with normal sinus rhythm (NSR).   IMPRESSION:  1. Severe Periodic Limb Movement Disorder (PLMD). This is the number one cause for arousals and sleep fragmentation.  2. There were many limb movements of moderate amplitude recorded before sleep onset, and these may indicate the presence of RLS. I printed 2 screen shots, one while the patient is still awake and one in NREM stage 3 sleep.    RECOMMENDATIONS:  1. Advise treatment of PLM disorder/ just like RLS this condition can indicate an underlying neuropathy, iron deficiency, posterior spinal cord lesion, spinal stenosis, or DDD at the lumbar or thoracic level. Primary forms of RLS/ PLM is usually inherited.  2. A work-up should include checking Ferritin, TIBC and iron saturation, NCV in both legs and - if indicated - medication treatment should be considered. ( Dr Lucia Gaskins will discuss this work up with me )

## 2020-11-10 NOTE — Procedures (Signed)
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 Dalbert Garnet reports that she gets about 7 hours of sleep- but not uninterrupted sleep.  One of her concerns is that she wakes up frequently.  She has assumed that she has a low- level apnea condition for many years, she knows that she snores.  She wakes up with morning headaches several days a week and sometimes has daytime somnolence. She is also very sensitive to time changes-for example when travelling to the [facility] or just with the first and last day of Daylight Savings Time.  Amanda James has a past medical history of Allergy, Cervical cancer screening (05/02/2017), Depression, Depression with anxiety, Diabetes mellitus type 2 in obese [facility]), History of chicken pox, Hyperlipidemia, Low back pain, Migraines, and Obesity (11/10/2016). She has never been insulin dependent, Obesity with BMI under 35.   The patient endorsed the Epworth Sleepiness Scale at 9 points.   The patient's weight 214 pounds with a height of 66 (inches), resulting in a BMI of 34.4 kg/m2. The patient's neck circumference measured 16 inches.  CURRENT MEDICATIONS: Lipitor, Zyrtec, Flexeril, Aimovig, Lexapro, Macrobid, Wegovy, Retin-A   PROCEDURE:  This is a multichannel digital polysomnogram utilizing the Somnostar 11.2 system.  Electrodes and sensors were applied and monitored per AASM Specifications.   EEG, EOG, Chin and Limb EMG, were sampled at 200 Hz.  ECG, Snore and Nasal Pressure, Thermal Airflow, Respiratory Effort, CPAP Flow and Pressure, Oximetry was sampled at 50 Hz. Digital video and audio were recorded.      BASELINE STUDY: Lights Out was at 00:01 and  Lights On at 07:05.  Total recording time (TRT) was 424.5 minutes, with a total sleep time (TST) of 366.5 minutes.   The patient's sleep latency was 37 minutes.  REM latency was 233 minutes.  The sleep efficiency was 86.3 %.     SLEEP ARCHITECTURE: WASO (Wake after sleep onset) was 27 minutes.  There were 35.5 minutes in Stage N1, 177 minutes Stage N2, 50 minutes Stage N3 and 104 minutes in Stage REM.  The percentage of Stage N1 was 9.7%, Stage N2 was 48.3%, Stage N3 was 13.6% and Stage R (REM sleep) was 28.4%.   RESPIRATORY ANALYSIS:  There were a total of 0 respiratory events: The total APNEA/HYPOPNEA INDEX (AHI) was 0/h.  0 events occurred in REM sleep and 0 events in NREM. The patient spent 119 minutes of total sleep time in the supine position and 248 minutes in non-supine. There was soft snoring, but no related arousals were recorded.   OXYGEN SATURATION & C02:  The Wake baseline 02 saturation was 95%, with the lowest being 90%.     The arousals were noted as: 26 were spontaneous, 119 were associated with PLMs, 0 were associated with respiratory events.  The patient had a total of 332 Periodic Limb Movements.  The Periodic Limb Movement (PLM) Arousal index was 19.5/hour. Audio and video analysis did not show any complex behaviors, phonations or vocalizations.    EKG was in keeping with normal sinus rhythm (NSR).   IMPRESSION:  1. Severe  Periodic Limb Movement Disorder (PLMD). This is the number one cause for arousals and sleep fragmentation.  2. There were many limb movements of moderate amplitude recorded before sleep onset, and these may indicate the presence of RLS. I printed 2 screen shots, one while the patient is still awake and one in NREM stage 3 sleep.    RECOMMENDATIONS:  1. Advise treatment of PLM disorder/ just like RLS this condition can indicate an underlying neuropathy, iron deficiency, posterior spinal cord lesion, spinal stenosis, or DDD at the lumbar or thoracic  level. Primary forms of RLS/ PLM is usually inherited.  2. A work-up should include checking Ferritin, TiBC and iron saturation, NCV in both legs, and medication treatment should be considered.  I certify that I have reviewed the entire raw data recording prior to the issuance of this report in accordance with the Standards of Accreditation of the American Academy of Sleep Medicine (AASM)    Melvyn Novas, MD Diplomat, American Board of Psychiatry and Neurology  Diplomat, American Board of Sleep Medicine Wellsite geologist, [place] [facility] [employer]

## 2020-11-13 ENCOUNTER — Encounter: Payer: Self-pay | Admitting: Neurology

## 2020-11-13 ENCOUNTER — Telehealth: Payer: Self-pay | Admitting: Neurology

## 2020-11-13 ENCOUNTER — Telehealth: Payer: Self-pay | Admitting: *Deleted

## 2020-11-13 LAB — INSULIN, RANDOM: Insulin: 19 u[IU]/mL

## 2020-11-13 NOTE — Telephone Encounter (Signed)
no to the covid questions MR Cervical spine w/wo contrast & MR Thoracic spine w/wo contrast Dr. Valentino Saxon [facility] Auth: NPR Ref # [id number]. Patient is scheduled at [facility] for 11/29/20.

## 2020-11-13 NOTE — Telephone Encounter (Signed)
Completed Bernita Raisin PA on Cover My Meds. Key: [id number]. Awiating determination from [employer].

## 2020-11-15 MED FILL — CYCLOBENZAPRINE HCL 10 MG T: 10 | 10 days supply | Qty: 30 | Fill #2

## 2020-11-16 NOTE — Telephone Encounter (Signed)
Amanda James has been denied due to requirement to try a triptan. The patient has actually tried Sumatriptan in the past. I have appealed this determination through cover my meds requesting expedited review. Key: [id number].

## 2020-11-16 NOTE — Telephone Encounter (Signed)
Per Cover My Meds, This request has been approved. The authorization is effective for a maximum of 6 fills from [id number] to [id number] , as long as the member is enrolled in their current health plan A written notification letter will follow with additional details.

## 2020-11-17 DIAGNOSIS — Z23 Encounter for immunization: Secondary | ICD-10-CM | POA: Diagnosis not present

## 2020-11-21 NOTE — Telephone Encounter (Signed)
Left message for patient to contact the office to arrange consult with Dr. Excell Seltzer. Left message a MyChart message would be sent to try to expedite.

## 2020-11-23 ENCOUNTER — Other Ambulatory Visit: Payer: Self-pay | Admitting: Family Medicine

## 2020-11-23 DIAGNOSIS — G8929 Other chronic pain: Secondary | ICD-10-CM

## 2020-11-23 MED FILL — CYCLOBENZAPRINE HCL 10 MG T: 10 | 10 days supply | Qty: 30 | Fill #0

## 2020-11-23 NOTE — Telephone Encounter (Signed)
Left message to call back  

## 2020-11-25 MED FILL — WEGOVY 2.4 MG/0.75ML SOAJ: 2.4 | 28 days supply | Qty: 3 | Fill #3

## 2020-11-29 ENCOUNTER — Ambulatory Visit (INDEPENDENT_AMBULATORY_CARE_PROVIDER_SITE_OTHER): Payer: 59

## 2020-11-29 ENCOUNTER — Other Ambulatory Visit: Payer: Self-pay

## 2020-11-29 DIAGNOSIS — G4761 Periodic limb movement disorder: Secondary | ICD-10-CM

## 2020-11-29 DIAGNOSIS — R259 Unspecified abnormal involuntary movements: Secondary | ICD-10-CM

## 2020-11-29 DIAGNOSIS — G959 Disease of spinal cord, unspecified: Secondary | ICD-10-CM | POA: Diagnosis not present

## 2020-11-29 DIAGNOSIS — G379 Demyelinating disease of central nervous system, unspecified: Secondary | ICD-10-CM

## 2020-11-29 MED ORDER — GADOBENATE DIMEGLUMINE 529 MG/ML IV SOLN
20.0000 mL | Freq: Once | INTRAVENOUS | Status: AC | PRN
  Administered 2020-11-29: 20 mL via INTRAVENOUS

## 2020-11-30 ENCOUNTER — Ambulatory Visit (INDEPENDENT_AMBULATORY_CARE_PROVIDER_SITE_OTHER): Payer: 59 | Admitting: Neurology

## 2020-11-30 ENCOUNTER — Other Ambulatory Visit: Payer: Self-pay

## 2020-11-30 ENCOUNTER — Other Ambulatory Visit (INDEPENDENT_AMBULATORY_CARE_PROVIDER_SITE_OTHER): Payer: Self-pay

## 2020-11-30 DIAGNOSIS — G629 Polyneuropathy, unspecified: Secondary | ICD-10-CM

## 2020-11-30 DIAGNOSIS — R259 Unspecified abnormal involuntary movements: Secondary | ICD-10-CM

## 2020-11-30 DIAGNOSIS — G959 Disease of spinal cord, unspecified: Secondary | ICD-10-CM | POA: Diagnosis not present

## 2020-11-30 DIAGNOSIS — G4761 Periodic limb movement disorder: Secondary | ICD-10-CM

## 2020-11-30 DIAGNOSIS — G379 Demyelinating disease of central nervous system, unspecified: Secondary | ICD-10-CM

## 2020-11-30 DIAGNOSIS — Z0289 Encounter for other administrative examinations: Secondary | ICD-10-CM

## 2020-11-30 DIAGNOSIS — G2581 Restless legs syndrome: Secondary | ICD-10-CM

## 2020-11-30 DIAGNOSIS — Z79899 Other long term (current) drug therapy: Secondary | ICD-10-CM | POA: Diagnosis not present

## 2020-12-01 MED FILL — MIRVASO 0.33% GEL PUMP: 0.33 | 30 days supply | Qty: 30 | Fill #2

## 2020-12-01 MED FILL — CYCLOBENZAPRINE HCL 10 MG T: 10 | 10 days supply | Qty: 30 | Fill #1

## 2020-12-03 MED ORDER — NEUPRO 1 MG/24HR TD PT24: 1.0000 mg | MEDICATED_PATCH | Freq: Every evening | TRANSDERMAL | 0 refills | Status: DC | PRN

## 2020-12-03 NOTE — Progress Notes (Signed)
See procedure note.

## 2020-12-03 NOTE — Procedures (Signed)
        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 Evaluate for peripheral neuropathy.  Summary: EMG/NCS was performed on left lower extremity. All nerves and muscles (as indicated in the following tables) were within normal limits.    Conclusion: This is a normal study, no electrophysiologic evidence for peripheral neuropathy.  Naomie Dean,  M.D.  [facility] [address] [place], [place] [address] Tel: [phone] Fax: [phone]  Verbal informed consent was obtained from the patient, patient was informed of potential risk of procedure, including bruising, bleeding, hematoma formation, infection, muscle weakness, muscle pain, numbness, among others.        MNC    Nerve / Sites Muscle Latency Ref. Amplitude Ref. Rel Amp Segments Distance Velocity Ref. Area    ms ms mV mV %  cm m/s m/s mVms  L Peroneal - EDB     Ankle EDB 5.5 ?6.5 7.4 ?2.0 100 Ankle - EDB 9   24.9     Fib head EDB 12.1  6.6  89.3 Fib head - Ankle 30 46 ?44 23.7     Pop fossa EDB 14.3  6.2  94.1 Pop fossa - Fib head 10 45 ?44 22.0         Pop fossa - Ankle      L Tibial - AH     Ankle AH 5.0 ?5.8 6.0 ?4.0 100 Ankle - AH 9   17.5     Pop fossa AH 13.8  4.9  80.4 Pop fossa - Ankle 39 44 ?41 19.3         SNC    Nerve / Sites Rec. Site Peak Lat Ref.  Amp Ref. Segments Distance    ms ms V V  cm  L Sural - Ankle (Calf)     Calf Ankle 3.1 ?4.4 28 ?6 Calf - Ankle 14  L Superficial peroneal - Ankle     Lat leg Ankle 3.9 ?4.4 7 ?6 Lat leg - Ankle 14         F  Wave    Nerve F Lat Ref.   ms ms  L Tibial - AH 43.7 ?56.0       EMG Summary Table    Spontaneous MUAP Recruitment  Muscle IA Fib PSW Fasc Other Amp Dur. Poly Pattern   L. Vastus medialis Normal None None None _______ Normal Normal Normal Normal  L. Tibialis anterior Normal None None None _______ Normal Normal Normal Normal  L. Gastrocnemius (Medial head) Normal None None None _______ Normal Normal Normal Normal  L. Abductor hallucis Normal None None None _______ Normal Normal Normal Normal  L. Extensor hallucis longus Normal None None None _______ Normal Normal Normal Normal

## 2020-12-03 NOTE — Progress Notes (Addendum)
        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 Evaluate for peripheral neuropathy.  Summary: EMG/NCS was performed on left lower extremity. All nerves and muscles (as indicated in the following tables) were within normal limits.    Conclusion: This is a normal study, no electrophysiologic evidence for peripheral neuropathy.  Already tried gabapentin, lyrica, mirapex and requip. Try Neupro 1mg  patch.   ,  M.D.  [facility] [address] [place], [place] Tel: [phone] Fax: [phone]  Verbal informed consent was obtained from the patient, patient was informed of potential risk of procedure, including bruising, bleeding, hematoma formation, infection, muscle weakness, muscle pain, numbness, among others.        MNC    Nerve / Sites Muscle Latency Ref. Amplitude Ref. Rel Amp Segments Distance Velocity Ref. Area    ms ms mV mV %  cm m/s m/s mVms  L Peroneal - EDB     Ankle EDB 5.5 ?6.5 7.4 ?2.0 100 Ankle - EDB 9   24.9     Fib head EDB 12.1  6.6  89.3 Fib head - Ankle 30 46 ?44 23.7     Pop fossa EDB 14.3  6.2  94.1 Pop fossa - Fib head 10 45 ?44 22.0         Pop fossa - Ankle      L Tibial - AH     Ankle AH 5.0 ?5.8 6.0 ?4.0 100 Ankle - AH 9   17.5     Pop fossa AH 13.8  4.9  80.4 Pop fossa - Ankle 39 44 ?41 19.3         SNC    Nerve / Sites Rec. Site Peak Lat Ref.  Amp Ref. Segments Distance    ms ms V V  cm  L Sural - Ankle (Calf)     Calf Ankle 3.1 ?4.4 28 ?6 Calf - Ankle 14  L Superficial peroneal - Ankle     Lat leg Ankle 3.9 ?4.4 7 ?6 Lat leg - Ankle 14         F  Wave    Nerve F Lat Ref.   ms ms  L Tibial - AH 43.7 ?56.0       EMG Summary Table     Spontaneous MUAP Recruitment  Muscle IA Fib PSW Fasc Other Amp Dur. Poly Pattern  L. Vastus medialis Normal None None None _______ Normal Normal Normal Normal  L. Tibialis anterior Normal None None None _______ Normal Normal Normal Normal  L. Gastrocnemius (Medial head) Normal None None None _______ Normal Normal Normal Normal  L. Abductor hallucis Normal None None None _______ Normal Normal Normal Normal  L. Extensor hallucis longus Normal None None None _______ Normal Normal Normal Normal

## 2020-12-04 ENCOUNTER — Other Ambulatory Visit: Payer: Self-pay | Admitting: Neurology

## 2020-12-04 DIAGNOSIS — Q2112 Patent foramen ovale: Secondary | ICD-10-CM

## 2020-12-04 DIAGNOSIS — G2581 Restless legs syndrome: Secondary | ICD-10-CM

## 2020-12-04 DIAGNOSIS — Q211 Atrial septal defect: Secondary | ICD-10-CM

## 2020-12-04 DIAGNOSIS — G4761 Periodic limb movement disorder: Secondary | ICD-10-CM

## 2020-12-04 MED ORDER — NEUPRO 1 MG/24HR TD PT24: 1.0000 mg | MEDICATED_PATCH | Freq: Every evening | TRANSDERMAL | 6 refills | Status: DC | PRN

## 2020-12-04 MED FILL — SODIUM FLUORIDE 5000 SENSIT: 1.1-5 | 30 days supply | Qty: 100 | Fill #2

## 2020-12-06 MED FILL — UBRELVY 100 MG TABS: 100 | 30 days supply | Qty: 8 | Fill #2

## 2020-12-07 ENCOUNTER — Ambulatory Visit (HOSPITAL_COMMUNITY)
Admission: RE | Admit: 2020-12-07 | Discharge: 2020-12-07 | Disposition: A | Discharge status: Home / Self Care | Payer: 59 | Source: Ambulatory Visit | Attending: Neurology | Admitting: Neurology

## 2020-12-07 ENCOUNTER — Other Ambulatory Visit: Payer: Self-pay

## 2020-12-07 DIAGNOSIS — Q211 Atrial septal defect: Secondary | ICD-10-CM | POA: Diagnosis not present

## 2020-12-07 DIAGNOSIS — Q2112 Patent foramen ovale: Secondary | ICD-10-CM

## 2020-12-07 NOTE — Progress Notes (Signed)
TCD bubble study completed with Dr. Roda Shutters. Refer to "CV Proc" under chart review to view preliminary results.  12/07/2020 4:24 PM Eula Fried., MHA, RVT, RDCS, RDMS

## 2020-12-09 MED FILL — CYCLOBENZAPRINE HCL 10 MG T: 10 | 10 days supply | Qty: 30 | Fill #2

## 2020-12-11 ENCOUNTER — Telehealth: Payer: Self-pay | Admitting: *Deleted

## 2020-12-11 NOTE — Telephone Encounter (Signed)
Completed Neupro patch PA on Cover My Med[employer]. Key: [id number]. Awaiting determination from [employer]. Already tried gabapentin, lyrica, mirapex and requip.

## 2020-12-12 MED FILL — NEUPRO 1 MG/24 HR PATCH: 1 | 28 days supply | Qty: 28 | Fill #0

## 2020-12-12 NOTE — Telephone Encounter (Addendum)
The request has been approved. The authorization is effective for a maximum of 12 fills from 12/11/2020 to 12/10/2021, as long as the member is enrolled in their current health plan. The request was approved as submitted. A written notification letter will follow with additional details.  I printed the approval letter and faxed it to the pharmacy. Received a receipt of confirmation.

## 2020-12-13 LAB — IRON AND TIBC
Iron Saturation: 16 % (ref 15–55)
Iron: 59 ug/dL (ref 27–159)
Total Iron Binding Capacity: 367 ug/dL (ref 250–450)
UIBC: 308 ug/dL (ref 131–425)

## 2020-12-13 LAB — MULTIPLE MYELOMA PANEL, SERUM
Albumin SerPl Elph-Mcnc: 3.9 g/dL (ref 2.9–4.4)
Albumin/Glob SerPl: 1.3 (ref 0.7–1.7)
Alpha 1: 0.2 g/dL (ref 0.0–0.4)
Alpha2 Glob SerPl Elph-Mcnc: 0.6 g/dL (ref 0.4–1.0)
B-Globulin SerPl Elph-Mcnc: 1 g/dL (ref 0.7–1.3)
Gamma Glob SerPl Elph-Mcnc: 1.2 g/dL (ref 0.4–1.8)
Globulin, Total: 3.1 g/dL (ref 2.2–3.9)
IgA/Immunoglobulin A, Serum: 269 mg/dL (ref 87–352)
IgG (Immunoglobin G), Serum: 1186 mg/dL (ref 586–1602)
IgM (Immunoglobulin M), Srm: 69 mg/dL (ref 26–217)
Total Protein: 7 g/dL (ref 6.0–8.5)

## 2020-12-13 LAB — TRANSFERRIN SATURATION
IRON SATN MFR SERPL: 14 % Saturation — ABNORMAL LOW
IRON SERPL-MCNC: 57 ug/dL
TRANSFERRIN SERPL-MCNC: 299 mg/dL

## 2020-12-13 LAB — HEAVY METALS, BLOOD
Arsenic: 2 ug/L (ref 2–23)
Lead, Blood: <1 ug/dL (ref 0–4)
Mercury: <1 ug/L (ref 0.0–14.9)

## 2020-12-13 LAB — METHYLMALONIC ACID, SERUM: Methylmalonic Acid: 137 nmol/L (ref 0–378)

## 2020-12-13 LAB — SJOGREN'S SYNDROME ANTIBODS(SSA + SSB)
ENA SSA (RO) Ab: <0.2 AI (ref 0.0–0.9)
ENA SSB (LA) Ab: <0.2 AI (ref 0.0–0.9)

## 2020-12-13 LAB — B12 AND FOLATE PANEL
Folate: 7.2 ng/mL (ref >3.0)
Vitamin B-12: 404 pg/mL (ref 232–1245)

## 2020-12-13 LAB — SEDIMENTATION RATE: Sed Rate: 3 mm/hr (ref 0–40)

## 2020-12-13 LAB — RHEUMATOID FACTOR: Rheumatoid fact SerPl-aCnc: <10 IU/mL (ref <14.0)

## 2020-12-13 LAB — FERRITIN: Ferritin: 155 ng/mL — ABNORMAL HIGH (ref 15–150)

## 2020-12-13 LAB — VITAMIN B1: Thiamine: 142.6 nmol/L (ref 66.5–200.0)

## 2020-12-13 LAB — ANTINUCLEAR ANTIBODIES, IFA: ANA Titer 1: NEGATIVE

## 2020-12-13 LAB — VITAMIN B6: Vitamin B6: 3.7 ug/L (ref 2.0–32.8)

## 2020-12-15 ENCOUNTER — Other Ambulatory Visit: Payer: Self-pay

## 2020-12-15 ENCOUNTER — Encounter: Payer: Self-pay | Admitting: Cardiovascular Disease

## 2020-12-15 ENCOUNTER — Ambulatory Visit (INDEPENDENT_AMBULATORY_CARE_PROVIDER_SITE_OTHER): Payer: 59 | Admitting: Cardiovascular Disease

## 2020-12-15 VITALS — BP 100/70 | HR 68 | Ht 66.0 in | Wt 215.0 lb

## 2020-12-15 DIAGNOSIS — Q211 Atrial septal defect: Secondary | ICD-10-CM | POA: Diagnosis not present

## 2020-12-15 DIAGNOSIS — Q2112 Patent foramen ovale: Secondary | ICD-10-CM

## 2020-12-15 NOTE — Patient Instructions (Addendum)
Medication Instructions:  Your provider recommends that you continue on your current medications as directed. Please refer to the Current Medication list given to you today.   *If you need a refill on your cardiac medications before your next appointment, please call your pharmacy*  Testing/Procedures: Dr. Excell Seltzer recommends you have a CALCIUM SCORE.  Follow-Up: Your provider recommends that you schedule a follow-up appointment AS NEEDED.

## 2020-12-15 NOTE — Progress Notes (Signed)
Cardiology Office Note:    Date:  12/16/2020   ID:  Amanda James, DOB 20-Feb-1971, MRN [id number]  PCP:  Mosie Lukes, MD  [place] Cardiologist:  No primary care provider on file.  [facility] Electrophysiologist:  None   Referring MD: Mosie Lukes, MD   Chief Complaint  Patient presents with  . PFO    History of Present Illness:    Amanda James is a 50 y.o. female presents for evaluation of PFO.  She is referred by Dr. Jaynee Eagles.  The patient is here alone today. She has a history of migraine headaches, progressive about 3 years ago. Her migraines are much better controlled under the care of Dr Jaynee Eagles. The patient has no history of stroke or TIA.  She denies exertional symptoms of chest pain or pressure.  She has rare palpitations in the setting of high stress.  No lightheadedness or syncope.  No family history of congenital heart disease.  Past Medical History:  Diagnosis Date  . Allergy   . Cervical cancer screening 05/02/2017  . Depression   . Depression with anxiety   . Diabetes mellitus type 2 in obese ([place])   . Diabetes mellitus without complication ([place])   . History of chicken pox   . Hyperlipidemia   . Low back pain   . Migraines   . Obesity 11/10/2016    Past Surgical History:  Procedure Laterality Date  . ABDOMINOPLASTY  2012  . AUGMENTATION MAMMAPLASTY     maxilift with implants   . BLADDER SUSPENSION  2016  . LAPAROSCOPIC GASTRIC BANDING  2005   esophageal dilation. band still present but released  . MASTOPEXY  2012    Current Medications: Current Meds  Medication Sig  . atorvastatin (LIPITOR) 10 MG tablet Take 1 tablet (10 mg total) by mouth daily.  . Brimonidine Tartrate 0.33 % GEL Apply 1 Dose topically daily.  . Cetirizine HCl 10 MG CAPS Take 1 capsule by mouth daily.  . cyclobenzaprine (FLEXERIL) 10 MG tablet TAKE 1 TABLET BY MOUTH 3 TIMES DAILY AS NEEDED FOR MUSCLE SPASMS  . Erenumab-aooe (AIMOVIG) 140 MG/ML SOAJ Inject 140 mg  into the skin every 30 (thirty) days.  [name] escitalopram (LEXAPRO) 10 MG tablet Take 1 tablet (10 mg total) by mouth every evening.  . escitalopram (LEXAPRO) 20 MG tablet Take 1 tablet (20 mg total) by mouth in the morning.  . nitrofurantoin, macrocrystal-monohydrate, (MACROBID) 100 MG capsule Take 1 capsule (100 mg total) by mouth 2 (two) times daily as needed.  . Rotigotine (NEUPRO) 1 MG/24HR PT24 Place 1 patch (1 mg total) onto the skin at bedtime as needed. AT bedtime. Take off and throw away in the morning  . Semaglutide-Weight Management (WEGOVY) 2.4 MG/0.75ML SOAJ Inject 2.4 mg into the skin once a week.  . SODIUM FLUORIDE 5000 SENSITIVE 1.1-5 % GEL Take by mouth.  . tretinoin (RETIN-A) 0.025 % cream Apply topically at bedtime. To affected areas  . Ubrogepant (UBRELVY) 100 MG TABS Take 100 mg by mouth every 2 (two) hours as needed. Maximum 254m a day.     Allergies:   Erythromycin   Social History   Socioeconomic History  . Marital status: Married    Spouse name: Not on file  . Number of children: 1  . Years of education: MD  . Highest education level: Not on file  Occupational History  . Occupation: [employer]  Tobacco Use  . Smoking status: Never Smoker  .  Smokeless tobacco: Never Used  Substance and Sexual Activity  . Alcohol use: Yes    Alcohol/week: 5.0 standard drinks    Types: 5 Glasses of wine per week    Comment: 3-5 glasses of wine per week  . Drug use: No  . Sexual activity: Not on file  Other Topics Concern  . Not on file  Social History Narrative   Works With [employer], lives with husband, 3 cats   No major dietary restrictions. Exercise 30 minutes 3 x a week   Seat belts routinely   Right-handed   Caffeine: 1-2 cups of coffee per day   Social Determinants of Health   Financial Resource Strain: Not on file  Food Insecurity: Not on file  Transportation Needs: Not on file  Physical Activity: Not on file  Stress: Not on file  Social Connections: Not on file      Family History: The patient's family history includes Arthritis in her maternal grandmother; Diabetes in her father, maternal grandmother, mother, and sister; Heart disease in her father and mother; Hyperlipidemia in her father, maternal grandmother, mother, paternal grandmother, and sister; Hypertension in her father, maternal grandmother, mother, and sister; Kidney disease in her paternal grandmother; Multiple myeloma in her maternal grandfather; Non-Hodgkin's lymphoma in her maternal grandmother; Obesity in her father, maternal grandmother, mother, and sister; Rosacea in her maternal grandmother. There is no history of Migraines.  ROS:   Please see the history of present illness.    All other systems reviewed and are negative.  EKGs/Labs/Other Studies Reviewed:    The following studies were reviewed today: Transcranial doppler 12/07/2020: High Intensity Transient Signals (HITS) heard at rest and with Valsalva,  suggestive of at least moderate-sized PFO with Valsalva maneuver.     Positive TCD Bubble study indicative of medium size right to left shunt   Echo 10-26-2020: IMPRESSIONS    1. Left ventricular ejection fraction, by estimation, is 65 to 70%. The  left ventricle has normal function. The left ventricle has no regional  wall motion abnormalities. Left ventricular diastolic parameters were  normal.  2. Right ventricular systolic function is normal. The right ventricular  size is normal.  3. There is evidence of Right to Left shunting with bubble study. It is  difficult to assess how soon the bubbles appear so the location of the  shunt is not know. Suggest TEE for further evaluation and to look for ASD  / PFO . . Evidence of atrial level  shunting detected by color flow Doppler.  4. The mitral valve is normal in structure. No evidence of mitral valve  regurgitation. No evidence of mitral stenosis.  5. The aortic valve is normal in structure. Aortic valve  regurgitation is  not visualized. No aortic stenosis is present.   MRI Brain 09-13-2020: FINDINGS:  The brain parenchyma shows no abnormal signal intensities.  No structural lesion, tumor or infarcts are noted.  Diffusion-weighted imaging is negative for acute ischemia.  T2 star images show no microhemorrhages.  Subarachnoid spaces and ventricular system appear normal.  There is mild supratentorial cortical atrophy.  There are mild changes of chronic paranasal sinus inflammation.  Pituitary gland and cerebellar tonsils appear normal.  Orbits appear unremarkable.  Flow-voids of large vessels of intracranial circulation appear to be patent.  Extra-axial brain structures appear normal.  Calvarium shows no abnormalities.  Visualized portion of the craniovertebral junction and upper cervical spine show no abnormalities.  Postcontrast images do not result in abnormal areas of enhancement.  IMPRESSION: Unremarkable MRI scan of the brain with and without contrast.  EKG:  EKG is ordered today.  The ekg ordered today demonstrates normal sinus rhythm, within normal limits  Recent Labs: 11/10/2020: ALT 29; BUN 16; Creatinine, Ser 0.85; Hemoglobin 13.5; Platelets 257.0; Potassium 4.7; Sodium 137; TSH 2.83  Recent Lipid Panel    Component Value Date/Time   CHOL 162 11/10/2020 0909   TRIG 100.0 11/10/2020 0909   HDL 65.00 11/10/2020 0909   CHOLHDL 2 11/10/2020 0909   VLDL 20.0 11/10/2020 0909   LDLCALC 77 11/10/2020 0909     Risk Assessment/Calculations:       Physical Exam:    VS:  BP 100/70   Pulse 68   Ht _0  (1.676 m)   Wt 215 lb (97.5 kg)   SpO2 98%   BMI 34.70 kg/m     Wt Readings from Last 3 Encounters:  12/15/20 215 lb (97.5 kg)  09/11/20 214 lb (97.1 kg)  08/11/20 217 lb 3.2 oz (98.5 kg)     GEN: Well nourished, well developed in no acute distress HEENT: Normal NECK: No JVD; No carotid bruits LYMPHATICS: No lymphadenopathy CARDIAC: RRR, no murmurs, rubs,  gallops RESPIRATORY:  Clear to auscultation without rales, wheezing or rhonchi  ABDOMEN: Soft, non-tender, non-distended MUSCULOSKELETAL:  No edema; No deformity  SKIN: Warm and dry NEUROLOGIC:  Alert and oriented x 3 PSYCHIATRIC:  Normal affect   ASSESSMENT:    1. PFO (patent foramen ovale)    PLAN:    In order of problems listed above:  62. 50 year old woman with probable PFO.  The patient is noted to have a longstanding history of migraine headaches, currently well controlled.  I have personally reviewed her MRI report as well as her echo report and images.  The patient's brain MRI shows no evidence of prior cerebral infarction.  Her echo images are personally reviewed.  Bubble study is performed with imaging done at rest and with Valsalva/cough.  There are very few bubbles that crossed right to left.  The patient has no evidence of any other structural heart disease with normal LV and RV size and systolic function.  There is no valvular disease.  The patient's transcranial Doppler study suggested a moderate right to left intracardiac shunt.  We reviewed available clinical trial data regarding transcatheter PFO closure with respect to migraine headaches as well as secondary risk reduction for patients presenting with cryptogenic stroke.  Anatomically I suspect the patient has a small to medium sized PFO, but at this time she does not have a clinical indication for transcatheter closure.  I do not think we need to pursue further studies as we would not move towards closure even if PFO is confirmed by TEE.  We discussed the importance of DVT/thromboembolism prevention and avoidance of medications or circumstances that would potentially increase her risk of thromboembolism.  I would be happy to see the patient back at any time if cardiac problems arise.  The patient is noted to have a strong family history of coronary artery disease with both parents having history of myocardial infarction.  We  discussed further testing for risk stratification and she is interested in pursuing a coronary calcium CT scan to assess coronary calcium burden.  This will be arranged.   Medication Adjustments/Labs and Tests Ordered: Current medicines are reviewed at length with the patient today.  Concerns regarding medicines are outlined above.  Orders Placed This Encounter  Procedures  . CT CARDIAC SCORING (SELF PAY  ONLY)  . EKG 12-Lead   No orders of the defined types were placed in this encounter.   Patient Instructions  Medication Instructions:  Your provider recommends that you continue on your current medications as directed. Please refer to the Current Medication list given to you today.   *If you need a refill on your cardiac medications before your next appointment, please call your pharmacy*  Testing/Procedures: Dr. Burt Knack recommends you have a CALCIUM SCORE.  Follow-Up: Your provider recommends that you schedule a follow-up appointment AS NEEDED.    Signed, Sherren Mocha, MD  12/16/2020 8:28 AM    [place]

## 2020-12-18 ENCOUNTER — Other Ambulatory Visit: Payer: Self-pay | Admitting: Family Medicine

## 2020-12-18 DIAGNOSIS — G8929 Other chronic pain: Secondary | ICD-10-CM

## 2020-12-19 MED FILL — WEGOVY 2.4 MG/0.75ML SOAJ: 2.4 | 28 days supply | Qty: 3 | Fill #4

## 2020-12-20 ENCOUNTER — Other Ambulatory Visit: Payer: 59

## 2020-12-20 DIAGNOSIS — Z20822 Contact with and (suspected) exposure to covid-19: Secondary | ICD-10-CM

## 2020-12-21 ENCOUNTER — Other Ambulatory Visit: Payer: Self-pay | Admitting: Family Medicine

## 2020-12-21 DIAGNOSIS — G8929 Other chronic pain: Secondary | ICD-10-CM

## 2020-12-21 DIAGNOSIS — M545 Low back pain, unspecified: Secondary | ICD-10-CM

## 2020-12-22 ENCOUNTER — Other Ambulatory Visit: Payer: Self-pay

## 2020-12-22 DIAGNOSIS — M545 Low back pain, unspecified: Secondary | ICD-10-CM

## 2020-12-22 DIAGNOSIS — G8929 Other chronic pain: Secondary | ICD-10-CM

## 2020-12-22 LAB — NOVEL CORONAVIRUS, NAA: SARS-CoV-2, NAA: NOT DETECTED

## 2020-12-22 LAB — SARS-COV-2, NAA 2 DAY TAT

## 2020-12-22 MED ORDER — CYCLOBENZAPRINE HCL 10 MG PO TABS: ORAL_TABLET | ORAL | 2 refills | Status: DC

## 2020-12-22 MED FILL — CYCLOBENZAPRINE HCL 10 MG T: 10 | 10 days supply | Qty: 30 | Fill #0

## 2020-12-25 ENCOUNTER — Other Ambulatory Visit: Payer: Self-pay | Admitting: Family Medicine

## 2020-12-25 DIAGNOSIS — G8929 Other chronic pain: Secondary | ICD-10-CM

## 2020-12-25 DIAGNOSIS — M545 Low back pain, unspecified: Secondary | ICD-10-CM

## 2020-12-26 ENCOUNTER — Telehealth: Payer: 59 | Admitting: Family Medicine

## 2020-12-29 ENCOUNTER — Inpatient Hospital Stay: Admission: RE | Admit: 2020-12-29 | Payer: 59 | Source: Ambulatory Visit

## 2020-12-30 MED FILL — CYCLOBENZAPRINE HCL 10 MG T: 10 | 10 days supply | Qty: 30 | Fill #1

## 2021-01-01 MED FILL — MIRVASO 0.33% GEL PUMP: 0.33 | 30 days supply | Qty: 30 | Fill #3

## 2021-01-03 MED FILL — NEUPRO 1 MG/24 HR PATCH: 1 | 28 days supply | Qty: 28 | Fill #1

## 2021-01-05 MED FILL — ESCITALOPRAM 10 MG TABLET: 10 | 90 days supply | Qty: 90 | Fill #1

## 2021-01-05 MED FILL — UBRELVY 100 MG TABS: 100 | 30 days supply | Qty: 8 | Fill #3

## 2021-01-06 IMAGING — MR MR LUMBAR SPINE W/O CM
4 of 5 series · 26 of 48 positions shown · non-contrast
Comparison: Radiography 07/21/2020

CLINICAL DATA: Low back pain

EXAM:
MRI LUMBAR SPINE WITHOUT CONTRAST
TECHNIQUE: Multiplanar, multisequence MR imaging of the lumbar spine was
performed. No intravenous contrast was administered.

[Series 2: T2 · sagittal · 4.0mm · 0.81mm/px · 6 of 15 slices shown (1 of 2)]
[im 1/15]
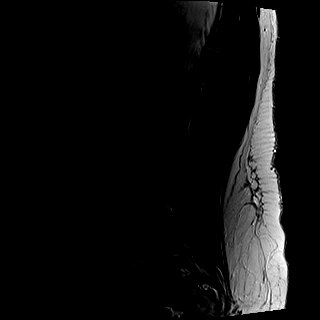
[im 3/15]
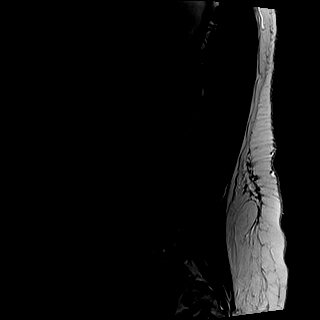
[im 6/15]
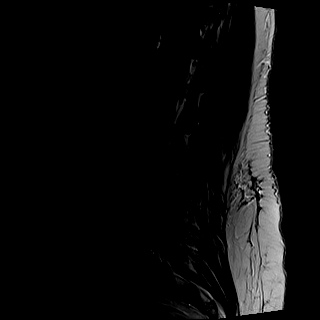
[im 9/15]
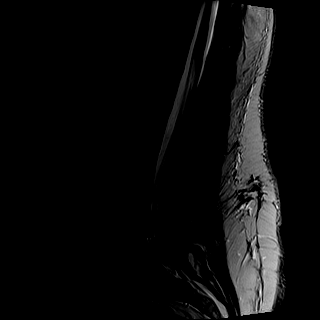
[im 12/15]
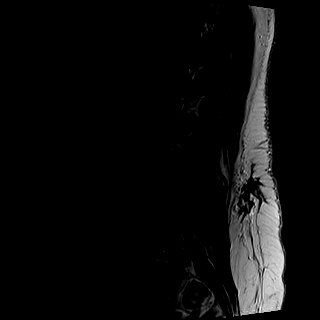
[im 15/15]
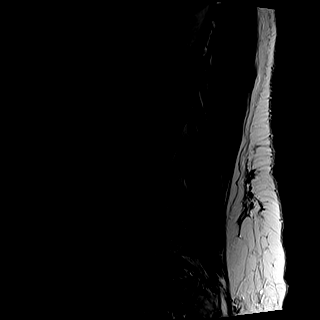

[Series 3: T1 · sagittal · 4.0mm · 0.41mm/px · 6 of 15 slices shown (1 of 2)]
[im 1/15]
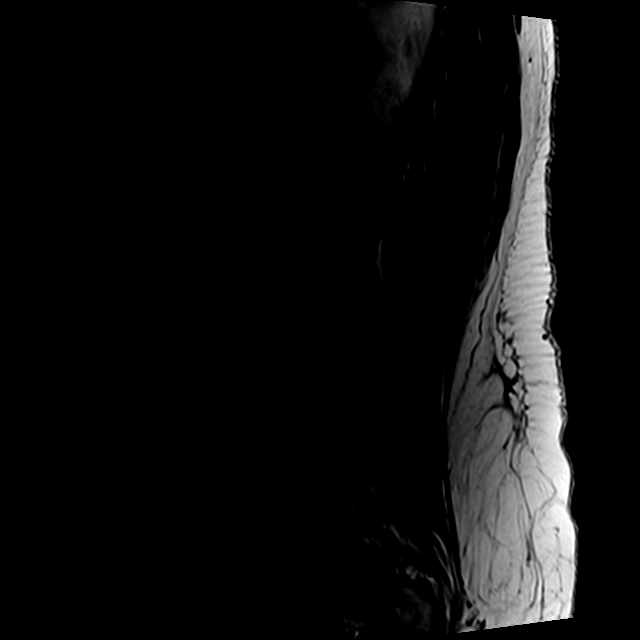
[im 3/15]
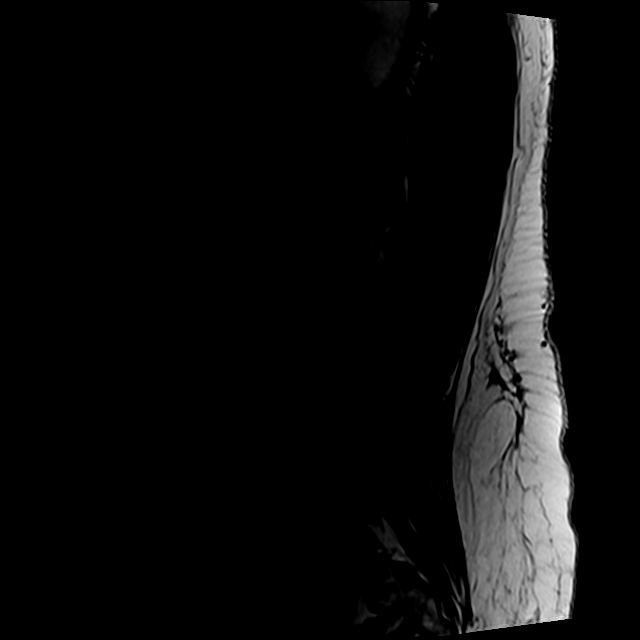
[im 6/15]
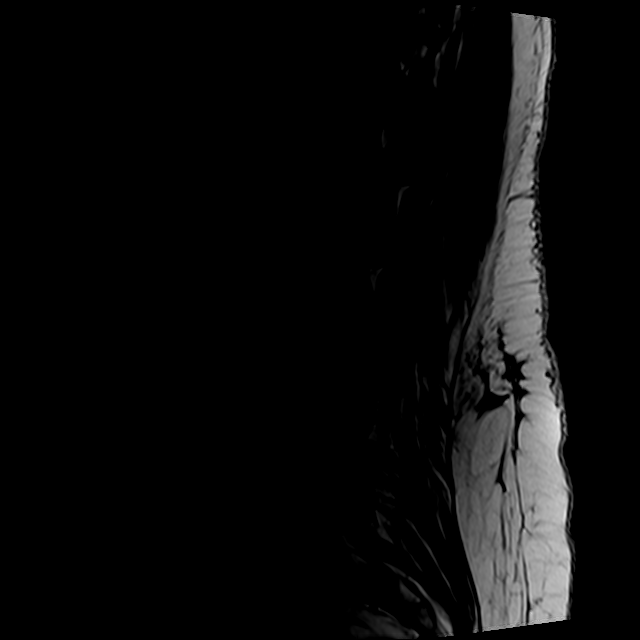
[im 9/15]
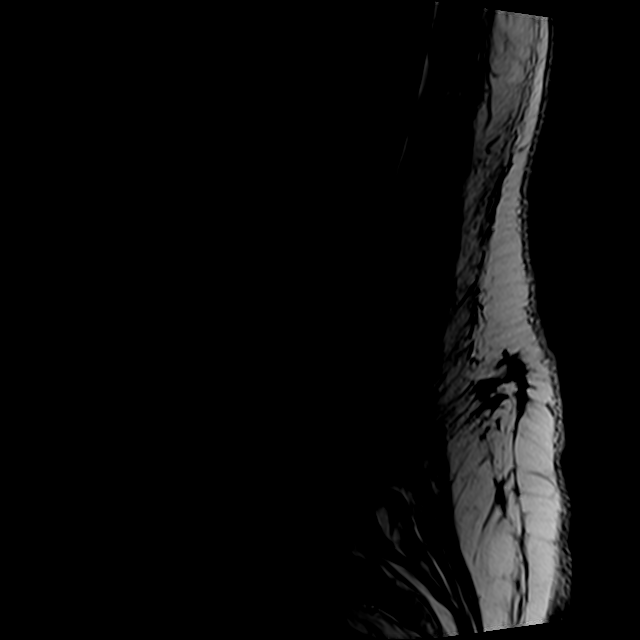
[im 12/15]
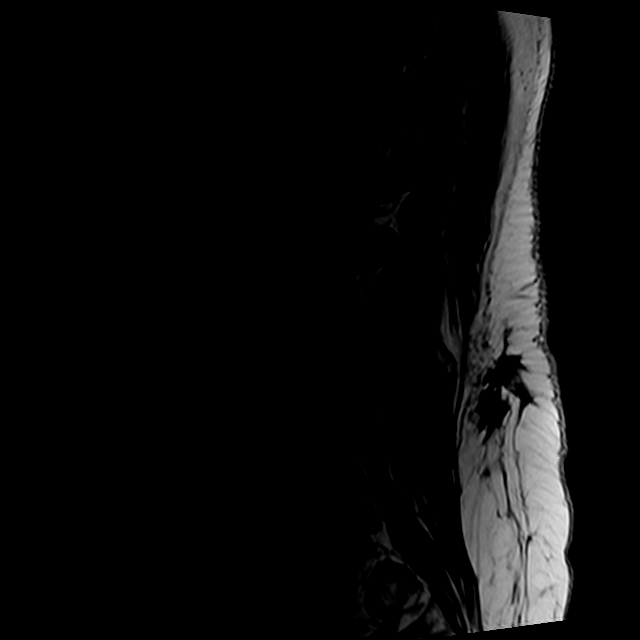
[im 15/15]
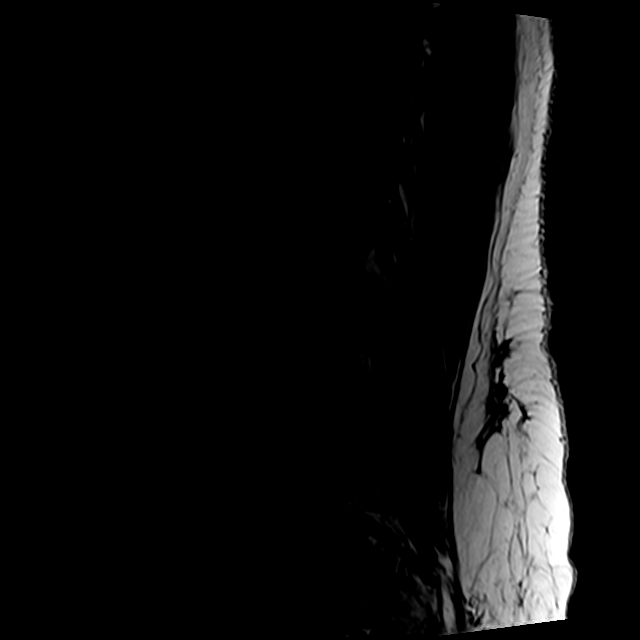

[Series 5: T2 · axial · 4.0mm · 0.78mm/px · z∈[-158,+58]mm · 9 of 40 slices shown (2 of 2)]
[im 1/40]
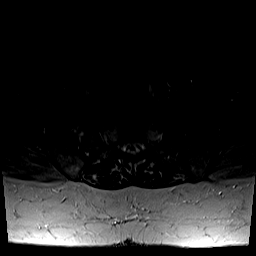
[im 6/40]
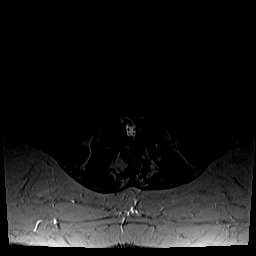
[im 12/40]
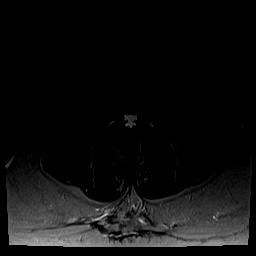
[im 17/40]
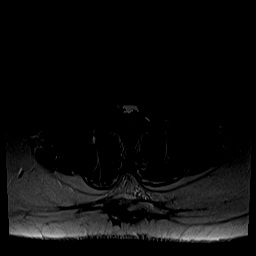
[im 20/40]
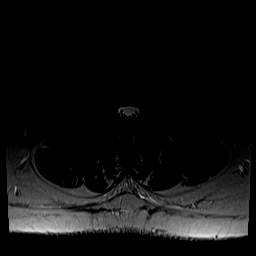
[im 23/40]
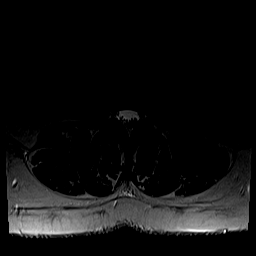
[im 28/40]
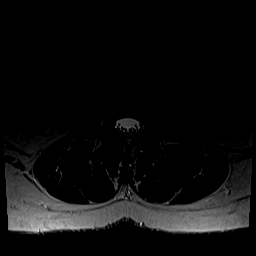
[im 34/40]
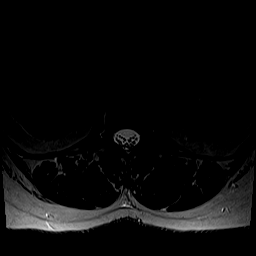
[im 40/40]
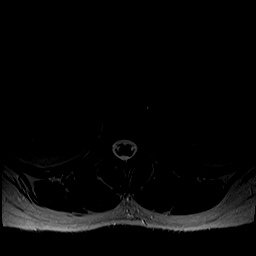

[Series 6: T1 · axial · 4.0mm · 0.39mm/px · z∈[-158,+28]mm · 5 of 40 slices shown (2 of 2)]
[im 1/40]
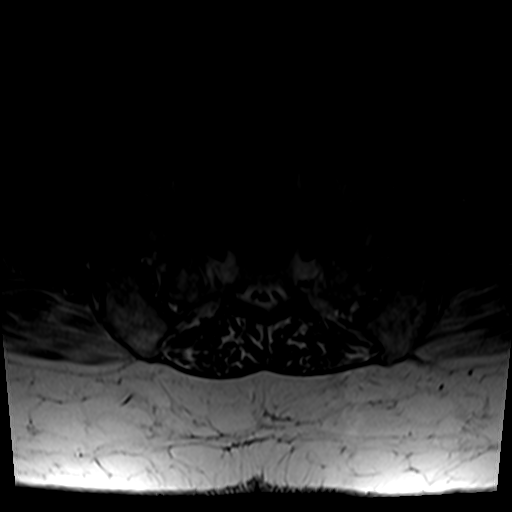
[im 6/40]
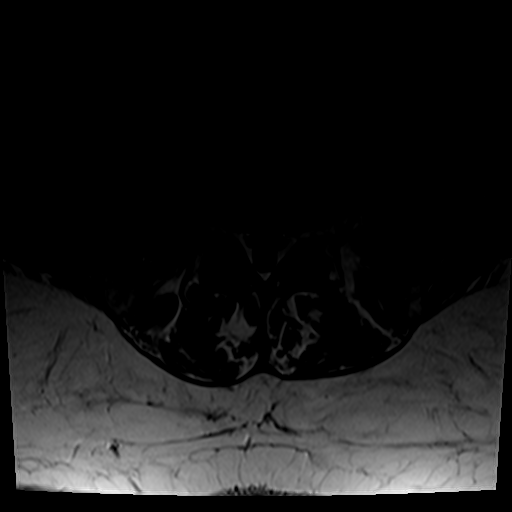
[im 12/40]
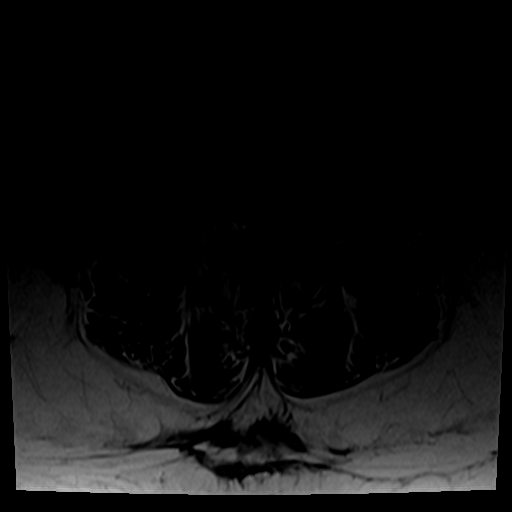
[im 20/40]
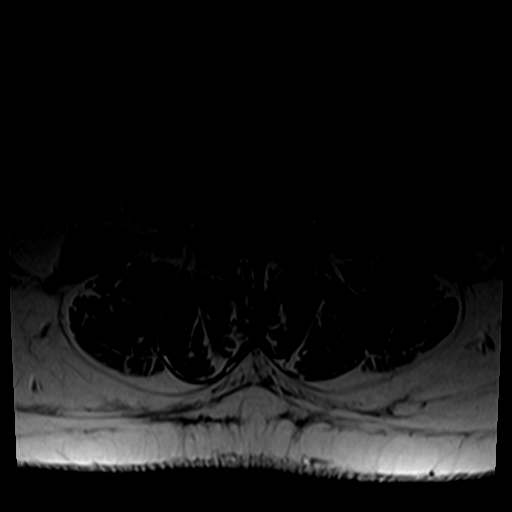
[im 34/40]
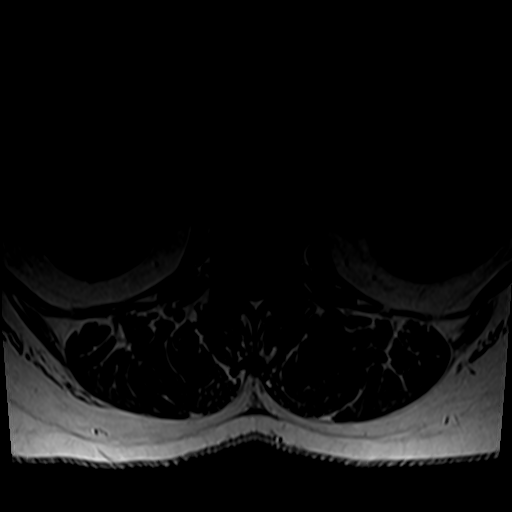

[26 of 48 positions shown; findings below may reference images not displayed]

FINDINGS: Segmentation:  5 lumbar type vertebral bodies.

Alignment:  Normal

Vertebrae:  Normal

Conus medullaris and cauda equina: Conus extends to the L1 level.
Conus and cauda equina appear normal.

Paraspinal and other soft tissues: Normal

Disc levels:

No abnormality at L2-3 or above.

L3-4: Desiccation and mild bulging of the disc. Mild facet
osteoarthritis. No compressive stenosis.

L4-5: Mild desiccation and bulging of the disc. Mild facet
osteoarthritis. No compressive stenosis.

L5-S1: Minimal bulging of the disc. Probable bilateral pars defects
and some facet osteoarthritis. No compressive stenosis.
IMPRESSION: L3-4 and L4-5: Mild bulging of the disc. Mild facet osteoarthritis.
No compressive stenosis. Findings could contribute to low back pain.

L5-S1: Chronic bilateral pars defects as well as mild facet
osteoarthritis. Minimal disc bulge. No stenosis. Findings could
contribute to low back pain.

## 2021-01-07 MED FILL — CYCLOBENZAPRINE HCL 10 MG T: 10 | 10 days supply | Qty: 30 | Fill #2

## 2021-01-15 ENCOUNTER — Other Ambulatory Visit: Payer: Self-pay | Admitting: Family Medicine

## 2021-01-15 DIAGNOSIS — G8929 Other chronic pain: Secondary | ICD-10-CM

## 2021-01-15 MED FILL — CYCLOBENZAPRINE HCL 10 MG T: 10 | 10 days supply | Qty: 30 | Fill #0

## 2021-01-16 MED FILL — WEGOVY 2.4 MG/0.75ML SOAJ: 2.4 | 28 days supply | Qty: 3 | Fill #5

## 2021-01-23 MED FILL — CYCLOBENZAPRINE HCL 10 MG T: 10 | 10 days supply | Qty: 30 | Fill #1

## 2021-01-26 ENCOUNTER — Other Ambulatory Visit: Payer: Self-pay

## 2021-01-26 ENCOUNTER — Ambulatory Visit (INDEPENDENT_AMBULATORY_CARE_PROVIDER_SITE_OTHER)
Admission: RE | Admit: 2021-01-26 | Discharge: 2021-01-26 | Disposition: A | Discharge status: Home / Self Care | Payer: Self-pay | Source: Ambulatory Visit | Attending: Cardiovascular Disease | Admitting: Cardiovascular Disease

## 2021-01-26 DIAGNOSIS — Q211 Atrial septal defect: Secondary | ICD-10-CM

## 2021-01-26 DIAGNOSIS — Q2112 Patent foramen ovale: Secondary | ICD-10-CM

## 2021-01-31 ENCOUNTER — Other Ambulatory Visit: Payer: Self-pay | Admitting: Family Medicine

## 2021-01-31 MED FILL — NEUPRO 1 MG/24 HR PATCH: 1 | 28 days supply | Qty: 28 | Fill #2

## 2021-01-31 MED FILL — MIRVASO 0.33% GEL PUMP: 0.33 | 30 days supply | Qty: 30 | Fill #0

## 2021-01-31 MED FILL — CYCLOBENZAPRINE HCL 10 MG T: 10 | 10 days supply | Qty: 30 | Fill #2

## 2021-02-02 MED ORDER — ATORVASTATIN CALCIUM 20 MG PO TABS: 20.0000 mg | ORAL_TABLET | Freq: Every day | ORAL | 3 refills | Status: DC

## 2021-02-02 MED ORDER — ASPIRIN EC 81 MG PO TBEC: 81.0000 mg | DELAYED_RELEASE_TABLET | Freq: Every day | ORAL | 3 refills | Status: DC

## 2021-02-02 MED FILL — ATORVASTATIN CALCIUM 20 MG: 20 | 90 days supply | Qty: 90 | Fill #0

## 2021-02-05 MED FILL — UBRELVY 100 MG TABS: 100 | 30 days supply | Qty: 8 | Fill #4

## 2021-02-06 ENCOUNTER — Other Ambulatory Visit: Payer: Self-pay | Admitting: Family Medicine

## 2021-02-06 ENCOUNTER — Other Ambulatory Visit: Payer: Self-pay | Admitting: *Deleted

## 2021-02-06 MED ORDER — ATORVASTATIN CALCIUM 20 MG PO TABS: 20.0000 mg | ORAL_TABLET | Freq: Every day | ORAL | 3 refills | Status: DC

## 2021-02-06 MED ORDER — ESCITALOPRAM OXALATE 20 MG PO TABS: 20.0000 mg | ORAL_TABLET | Freq: Every morning | ORAL | 1 refills | Status: DC

## 2021-02-06 MED FILL — ESCITALOPRAM 20 MG TABLET: 20 | 90 days supply | Qty: 90 | Fill #0

## 2021-02-07 MED FILL — ATORVASTATIN CALCIUM 10 MG: 10 | 90 days supply | Qty: 90 | Fill #0

## 2021-02-09 ENCOUNTER — Other Ambulatory Visit: Payer: Self-pay

## 2021-02-09 ENCOUNTER — Telehealth (INDEPENDENT_AMBULATORY_CARE_PROVIDER_SITE_OTHER): Payer: 59 | Admitting: Family Medicine

## 2021-02-09 DIAGNOSIS — L700 Acne vulgaris: Secondary | ICD-10-CM

## 2021-02-09 DIAGNOSIS — E559 Vitamin D deficiency, unspecified: Secondary | ICD-10-CM

## 2021-02-09 DIAGNOSIS — E6609 Other obesity due to excess calories: Secondary | ICD-10-CM

## 2021-02-09 DIAGNOSIS — E1169 Type 2 diabetes mellitus with other specified complication: Secondary | ICD-10-CM | POA: Diagnosis not present

## 2021-02-09 DIAGNOSIS — G43709 Chronic migraine without aura, not intractable, without status migrainosus: Secondary | ICD-10-CM | POA: Diagnosis not present

## 2021-02-09 DIAGNOSIS — I2584 Coronary atherosclerosis due to calcified coronary lesion: Secondary | ICD-10-CM

## 2021-02-09 DIAGNOSIS — E669 Obesity, unspecified: Secondary | ICD-10-CM

## 2021-02-09 DIAGNOSIS — Q211 Atrial septal defect: Secondary | ICD-10-CM | POA: Diagnosis not present

## 2021-02-09 DIAGNOSIS — I2510 Atherosclerotic heart disease of native coronary artery without angina pectoris: Secondary | ICD-10-CM

## 2021-02-09 DIAGNOSIS — Q2112 Patent foramen ovale: Secondary | ICD-10-CM

## 2021-02-09 DIAGNOSIS — G2581 Restless legs syndrome: Secondary | ICD-10-CM

## 2021-02-09 DIAGNOSIS — E782 Mixed hyperlipidemia: Secondary | ICD-10-CM

## 2021-02-09 DIAGNOSIS — Z6834 Body mass index (BMI) 34.0-34.9, adult: Secondary | ICD-10-CM

## 2021-02-09 MED ORDER — TRETINOIN 0.025 % EX CREA
TOPICAL_CREAM | Freq: Every day | CUTANEOUS | 5 refills | Status: DC
Start: 2021-02-09 — End: 2021-10-04

## 2021-02-11 DIAGNOSIS — Q211 Atrial septal defect: Secondary | ICD-10-CM | POA: Insufficient documentation

## 2021-02-11 DIAGNOSIS — G2581 Restless legs syndrome: Secondary | ICD-10-CM | POA: Insufficient documentation

## 2021-02-11 DIAGNOSIS — Q2112 Patent foramen ovale: Secondary | ICD-10-CM | POA: Insufficient documentation

## 2021-02-11 DIAGNOSIS — I2510 Atherosclerotic heart disease of native coronary artery without angina pectoris: Secondary | ICD-10-CM | POA: Insufficient documentation

## 2021-02-11 NOTE — Assessment & Plan Note (Signed)
She is maintaining on her current regimen

## 2021-02-11 NOTE — Assessment & Plan Note (Signed)
hgba1c acceptable, minimize simple carbs. Increase exercise as tolerated. Continue current meds 

## 2021-02-11 NOTE — Assessment & Plan Note (Signed)
Follows with Dr Lucia Gaskins and is doing well

## 2021-02-11 NOTE — Assessment & Plan Note (Addendum)
Tolerating statin, encouraged heart healthy diet, avoid trans fats, minimize simple carbs and saturated fats. Increase exercise as tolerated. Due to the high calcium score on her recent CT scan her Atorvastatin is increased to 20 mg daily

## 2021-02-11 NOTE — Assessment & Plan Note (Signed)
Found on echo that was run due to her migraines. She has been seen by Dr Excell Seltzer of cardiology and they do not think she requires surgery at this time.

## 2021-02-11 NOTE — Assessment & Plan Note (Signed)
She had a recent scan that yielded a score of 109 so her Atorvastatin was increased to 20 mg and she was started on an 81 mg Aspirin daily. She has a strong FH of her father having a heart attack at an early age. She will continue to maintain a heart healthy diet and stay active.

## 2021-02-11 NOTE — Assessment & Plan Note (Signed)
Supplement and monitor 

## 2021-02-11 NOTE — Assessment & Plan Note (Signed)
Is improved on Nupro.

## 2021-02-11 NOTE — Progress Notes (Signed)
Virtual Visit via Video Note  I connected with [name] on 02/09/21 at  9:40 AM EST by a video enabled telemedicine application and verified that I am speaking with the correct person using two identifiers.  Location: Patient: home, patient and provider are in visit Provider: home   I discussed the limitations of evaluation and management by telemedicine and the availability of in person appointments. The patient expressed understanding and agreed to proceed. S Chism, CMA was able to get the patient set up on a video visit    Subjective:    Patient ID: Amanda James, female    DOB: 11-May-1971, 50 y.o.   MRN: [id number]  Chief Complaint  Patient presents with  . Follow-up  . Diabetes  . Hyperlipidemia    HPI Patient is in today for follow up on chronic medical concerns. No recent febrile illness or hospitalizations. She had a recent Cardiac calcium scoring done and she had a score of 109 so she has had her Atorvastatin increased to 109 and she has started a ECASA 81 mg added daily. She has had an Echo which discovered a PFO but this does not require surgery. She also had a sleep study and she was found to has RLS and she has been doing better with Nupro. Denies CP/palp/SOB/HA/congestion/fevers/GI or GU c/o. Taking meds as prescribed  Past Medical History:  Diagnosis Date  . Allergy   . Cervical cancer screening 05/02/2017  . Depression   . Depression with anxiety   . Diabetes mellitus type 2 in obese ([place])   . Diabetes mellitus without complication ([employer])   . History of chicken pox   . Hyperlipidemia   . Low back pain   . Migraines   . Obesity 11/10/2016    Past Surgical History:  Procedure Laterality Date  . ABDOMINOPLASTY  2012  . AUGMENTATION MAMMAPLASTY     maxilift with implants   . BLADDER SUSPENSION  2016  . LAPAROSCOPIC GASTRIC BANDING  2005   esophageal dilation. band still present but released  . MASTOPEXY  2012    Family History  Problem Relation Age  of Onset  . Diabetes Mother   . Hypertension Mother   . Hyperlipidemia Mother   . Obesity Mother   . Heart disease Mother        pacer  . Diabetes Father   . Heart disease Father        MI  . Hyperlipidemia Father   . Hypertension Father   . Obesity Father   . Diabetes Sister   . Hyperlipidemia Sister   . Hypertension Sister   . Obesity Sister   . Obesity Maternal Grandmother   . Hypertension Maternal Grandmother   . Hyperlipidemia Maternal Grandmother   . Diabetes Maternal Grandmother   . Rosacea Maternal Grandmother   . Non-Hodgkin's lymphoma Maternal Grandmother   . Arthritis Maternal Grandmother        spinal stenosis  . Multiple myeloma Maternal Grandfather   . Hyperlipidemia Paternal Grandmother        rhabdo from statins  . Kidney disease Paternal Grandmother   . Migraines Neg Hx     Social History   Socioeconomic History  . Marital status: Married    Spouse name: Not on file  . Number of children: 1  . Years of education: MD  . Highest education level: Not on file  Occupational History  . Occupation: [employer]  Tobacco Use  . Smoking status: Never Smoker  .  Smokeless tobacco: Never Used  Substance and Sexual Activity  . Alcohol use: Yes    Alcohol/week: 5.0 standard drinks    Types: 5 Glasses of wine per week    Comment: 3-5 glasses of wine per week  . Drug use: No  . Sexual activity: Not on file  Other Topics Concern  . Not on file  Social History Narrative   Works With [employer], lives with husband, 3 cats   No major dietary restrictions. Exercise 30 minutes 3 x a week   Seat belts routinely   Right-handed   Caffeine: 1-2 cups of coffee per day   Social Determinants of Health   Financial Resource Strain: Not on file  Food Insecurity: Not on file  Transportation Needs: Not on file  Physical Activity: Not on file  Stress: Not on file  Social Connections: Not on file  Intimate Partner Violence: Not on file    Outpatient Medications Prior to  Visit  Medication Sig Dispense Refill  . aspirin EC 81 MG tablet Take 1 tablet (81 mg total) by mouth daily. Swallow whole. 90 tablet 3  . atorvastatin (LIPITOR) 20 MG tablet Take 1 tablet (20 mg total) by mouth daily. 90 tablet 3  . Brimonidine Tartrate (MIRVASO) 0.33 % GEL Apply daily as directed 30 g 3  . Cetirizine HCl 10 MG CAPS Take 1 capsule by mouth daily.    . cyclobenzaprine (FLEXERIL) 10 MG tablet TAKE 1 TABLET BY MOUTH 3 TIMES DAILY AS NEEDED FOR MUSCLE SPASMS 30 tablet 2  . Erenumab-aooe (AIMOVIG) 140 MG/ML SOAJ Inject 140 mg into the skin every 30 (thirty) days. 1 pen 5  . escitalopram (LEXAPRO) 10 MG tablet Take 1 tablet (10 mg total) by mouth every evening. 90 tablet 1  . escitalopram (LEXAPRO) 20 MG tablet Take 1 tablet (20 mg total) by mouth in the morning. 90 tablet 1  . nitrofurantoin, macrocrystal-monohydrate, (MACROBID) 100 MG capsule Take 1 capsule (100 mg total) by mouth 2 (two) times daily as needed. 20 capsule 1  . Rotigotine (NEUPRO) 1 MG/24HR PT24 Place 1 patch (1 mg total) onto the skin at bedtime as needed. AT bedtime. Take off and throw away in the morning 28 patch 6  . Semaglutide-Weight Management (WEGOVY) 2.4 MG/0.75ML SOAJ Inject 2.4 mg into the skin once a week. 3 mL 5  . SODIUM FLUORIDE 5000 SENSITIVE 1.1-5 % GEL Take by mouth.    . Ubrogepant (UBRELVY) 100 MG TABS Take 100 mg by mouth every 2 (two) hours as needed. Maximum 252m a day. 8 tablet 11  . tretinoin (RETIN-A) 0.025 % cream Apply topically at bedtime. To affected areas 45 g 5   Facility-Administered Medications Prior to Visit  Medication Dose Route Frequency Provider Last Rate Last Admin  . gadobenate dimeglumine (MULTIHANCE) injection 20 mL  20 mL Intravenous Once PRN ASarina IllB, MD      . sodium chloride flush (NS) 0.9 % injection 10 mL  10 mL Intravenous PRN AMelvenia Beam MD   20 mL at 10/26/20 1345    Allergies  Allergen Reactions  . Erythromycin Diarrhea    Review of Systems   Constitutional: Negative for fever and malaise/fatigue.  HENT: Negative for congestion.   Eyes: Negative for blurred vision.  Respiratory: Negative for shortness of breath.   Cardiovascular: Negative for chest pain, palpitations and leg swelling.  Gastrointestinal: Negative for abdominal pain, blood in stool and nausea.  Genitourinary: Negative for dysuria and frequency.  Musculoskeletal: Negative  for falls.  Skin: Negative for rash.  Neurological: Negative for dizziness, loss of consciousness and headaches.  Endo/Heme/Allergies: Negative for environmental allergies.  Psychiatric/Behavioral: Negative for depression. The patient is not nervous/anxious.        Objective:    Physical Exam Constitutional:      Appearance: Normal appearance. She is not ill-appearing.  HENT:     Head: Normocephalic and atraumatic.     Right Ear: External ear normal.     Left Ear: External ear normal.     Nose: Nose normal.  Pulmonary:     Effort: Pulmonary effort is normal.  Neurological:     Mental Status: She is alert and oriented to person, place, and time.  Psychiatric:        Behavior: Behavior normal.     LMP 01/12/2021  Wt Readings from Last 3 Encounters:  12/15/20 215 lb (97.5 kg)  09/11/20 214 lb (97.1 kg)  08/11/20 217 lb 3.2 oz (98.5 kg)    Diabetic Foot Exam - Simple   No data filed    Lab Results  Component Value Date   WBC 8.0 11/10/2020   HGB 13.5 11/10/2020   HCT 39.7 11/10/2020   PLT 257.0 11/10/2020   GLUCOSE 90 11/10/2020   CHOL 162 11/10/2020   TRIG 100.0 11/10/2020   HDL 65.00 11/10/2020   LDLCALC 77 11/10/2020   ALT 29 11/10/2020   AST 20 11/10/2020   NA 137 11/10/2020   K 4.7 11/10/2020   CL 104 11/10/2020   CREATININE 0.85 11/10/2020   BUN 16 11/10/2020   CO2 26 11/10/2020   TSH 2.83 11/10/2020   INR 1.04 09/17/2016   HGBA1C 5.3 11/10/2020   MICROALBUR <0.7 09/10/2019    Lab Results  Component Value Date   TSH 2.83 11/10/2020   Lab Results   Component Value Date   WBC 8.0 11/10/2020   HGB 13.5 11/10/2020   HCT 39.7 11/10/2020   MCV 95.5 11/10/2020   PLT 257.0 11/10/2020   Lab Results  Component Value Date   NA 137 11/10/2020   K 4.7 11/10/2020   CO2 26 11/10/2020   GLUCOSE 90 11/10/2020   BUN 16 11/10/2020   CREATININE 0.85 11/10/2020   BILITOT 1.6 (H) 11/10/2020   ALKPHOS 60 11/10/2020   AST 20 11/10/2020   ALT 29 11/10/2020   PROT 7.0 11/30/2020   ALBUMIN 3.9 11/10/2020   CALCIUM 9.3 11/10/2020   ANIONGAP 5 09/17/2016   GFR 79.96 11/10/2020   Lab Results  Component Value Date   CHOL 162 11/10/2020   Lab Results  Component Value Date   HDL 65.00 11/10/2020   Lab Results  Component Value Date   LDLCALC 77 11/10/2020   Lab Results  Component Value Date   TRIG 100.0 11/10/2020   Lab Results  Component Value Date   CHOLHDL 2 11/10/2020   Lab Results  Component Value Date   HGBA1C 5.3 11/10/2020       Assessment & Plan:   Problem List Items Addressed This Visit    Diabetes mellitus type 2 in obese ([place])    hgba1c acceptable, minimize simple carbs. Increase exercise as tolerated. Continue current meds      Hyperlipidemia    Tolerating statin, encouraged heart healthy diet, avoid trans fats, minimize simple carbs and saturated fats. Increase exercise as tolerated. Due to the high calcium score on her recent CT scan her Atorvastatin is increased to 20 mg daily      Vitamin  D deficiency    Supplement and monitor      Chronic migraine w/o aura w/o status migrainosus, not intractable    Follows with Dr Jaynee Eagles and is doing well      Class 1 obesity due to excess calories with serious comorbidity and body mass index (BMI) of 34.0 to 34.9 in adult    She is maintaining on her current regimen      PFO (patent foramen ovale)    Found on echo that was run due to her migraines. She has been seen by Dr Burt Knack of cardiology and they do not think she requires surgery at this time.       RLS  (restless legs syndrome)    Is improved on Nupro.      CAD (coronary artery disease)    She had a recent scan that yielded a score of 109 so her Atorvastatin was increased to 20 mg and she was started on an 81 mg Aspirin daily. She has a strong FH of her father having a heart attack at an early age. She will continue to maintain a heart healthy diet and stay active.        Other Visit Diagnoses    Acne vulgaris       Relevant Medications   tretinoin (RETIN-A) 0.025 % cream      I am having Dr. Starlyn Skeans "[name]" maintain her Cetirizine HCl, Erenumab-aooe, nitrofurantoin (macrocrystal-monohydrate), Mancel Parsons, Roselyn Meier, escitalopram, Neupro, Sodium Fluoride 5000 Sensitive, cyclobenzaprine, Mirvaso, aspirin EC, atorvastatin, escitalopram, and tretinoin.  Meds ordered this encounter  Medications  . tretinoin (RETIN-A) 0.025 % cream    Sig: Apply topically at bedtime. To affected areas    Dispense:  45 g    Refill:  5    I discussed the assessment and treatment plan with the patient. The patient was provided an opportunity to ask questions and all were answered. The patient agreed with the plan and demonstrated an understanding of the instructions.   The patient was advised to call back or seek an in-person evaluation if the symptoms worsen or if the condition fails to improve as anticipated.  I provided 20 minutes of non-face-to-face time during this encounter.   Penni Homans, MD

## 2021-02-13 ENCOUNTER — Other Ambulatory Visit: Payer: Self-pay | Admitting: Family Medicine

## 2021-02-13 MED FILL — WEGOVY 2.4 MG/0.75ML SOAJ: 2.4 | 28 days supply | Qty: 3 | Fill #0

## 2021-02-15 ENCOUNTER — Other Ambulatory Visit: Payer: Self-pay | Admitting: *Deleted

## 2021-02-15 ENCOUNTER — Telehealth: Payer: Self-pay | Admitting: *Deleted

## 2021-02-15 NOTE — Progress Notes (Signed)
I, Christoper Fabian, LAT, ATC, am serving as scribe for Dr. Clementeen Graham.  Amanda James is a 50 y.o. female who presents to [facility] at [facility] today for f/u of low back pain, R>L.  She was last seen by Dr. Denyse Amass on 08/11/20 to review her lumbar MRI and noted no change in her symptoms.  She has had 2 right L4-5 ESI on 08/25/20 and 10/20/20.  Today, pt reports that the epidurals have provided good relief, particularly the 2nd one.  She states her pain is located at her mid low back w/ some radiating pain into the R buttocks.  She is getting ready do begin her spring gardening and wants to get on top of things before her back pain flares more w/ the gardening.  She has been using a combo of Naproxen and Flexeril prn.  Diagnostic imaging: L-spine MRI- 07/30/20; L-spine XR- 07/21/20   Pertinent review of systems: No fevers or chills  Relevant historical information: Diabetes, obesity, pars defect lumbar spine   Exam:  BP 114/78 (BP Location: Right Arm, Patient Position: Sitting, Cuff Size: Normal)   Pulse 76   Ht 5\' 6"  (1.676 m)   Wt 218 lb 3.2 oz (99 kg)   SpO2 98%   BMI 35.22 kg/m  General: Well Developed, well nourished, and in no acute distress.   MSK: L-spine normal motion normal gait    Lab and Radiology Results EXAM: MRI LUMBAR SPINE WITHOUT CONTRAST  TECHNIQUE: Multiplanar, multisequence MR imaging of the lumbar spine was performed. No intravenous contrast was administered.  COMPARISON:  Radiography 07/21/2020  FINDINGS: Segmentation:  5 lumbar type vertebral bodies.  Alignment:  Normal  Vertebrae:  Normal  Conus medullaris and cauda equina: Conus extends to the L1 level. Conus and cauda equina appear normal.  Paraspinal and other soft tissues: Normal  Disc levels:  No abnormality at L2-3 or above.  L3-4: Desiccation and mild bulging of the disc. Mild facet osteoarthritis. No compressive stenosis.  L4-5: Mild desiccation and  bulging of the disc. Mild facet osteoarthritis. No compressive stenosis.  L5-S1: Minimal bulging of the disc. Probable bilateral pars defects and some facet osteoarthritis. No compressive stenosis.  IMPRESSION: L3-4 and L4-5: Mild bulging of the disc. Mild facet osteoarthritis. No compressive stenosis. Findings could contribute to low back pain.  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 Minimal disc bulge. No stenosis. Findings could contribute to low back pain.   Electronically Signed   By: 07/23/2020 M.D.   On: 07/30/2020 13:56  I, 08/01/2020, personally (independently) visualized and performed the interpretation of the images attached in this note.     Assessment and Plan: 50 y.o. female with low back pain secondary to muscle dysfunction and pars defects.  Plan to continue home exercise program taught by physical therapy previously.  Additionally we will repeat epidural steroid injection.  Patient had better benefit from interlaminar injection at right L4-L5 than she did at L5-S1.  We will proceed with the L4-L5 injection.  Continue home exercise program.  Consider formal physical therapy again.  Keep me updated.  Recheck back as needed.   PDMP not reviewed this encounter. Orders Placed This Encounter  Procedures  . DG INJECT DIAG/THERA/INC NEEDLE/CATH/PLC EPI/LUMB/SAC W/IMG    Standing Status:   Future    Standing Expiration Date:   02/16/2022    Order Specific Question:   Reason for Exam (SYMPTOM  OR DIAGNOSIS REQUIRED)  Answer:   interlaminar  right at L4-5.    Order Specific Question:   Is the patient pregnant?    Answer:   No    Order Specific Question:   Preferred Imaging Location?    Answer:   [id number]    Order Specific Question:   Radiology Contrast Protocol - do NOT remove file path    Answer:   [email or web address]   No orders of the defined types were placed in this  encounter.    Discussed warning signs or symptoms. Please see discharge instructions. Patient expresses understanding.   The above documentation has been reviewed and is accurate and complete Clementeen Graham, M.D.

## 2021-02-15 NOTE — Telephone Encounter (Signed)
error 

## 2021-02-16 ENCOUNTER — Encounter: Payer: Self-pay | Admitting: Family Medicine

## 2021-02-16 ENCOUNTER — Ambulatory Visit (INDEPENDENT_AMBULATORY_CARE_PROVIDER_SITE_OTHER): Payer: 59 | Admitting: Family Medicine

## 2021-02-16 ENCOUNTER — Other Ambulatory Visit: Payer: Self-pay

## 2021-02-16 VITALS — BP 114/78 | HR 76 | Ht 66.0 in | Wt 218.2 lb

## 2021-02-16 DIAGNOSIS — G8929 Other chronic pain: Secondary | ICD-10-CM | POA: Diagnosis not present

## 2021-02-16 DIAGNOSIS — M545 Low back pain, unspecified: Secondary | ICD-10-CM | POA: Diagnosis not present

## 2021-02-16 DIAGNOSIS — M4306 Spondylolysis, lumbar region: Secondary | ICD-10-CM

## 2021-02-16 NOTE — Patient Instructions (Signed)
Thank you for coming in today.  Please call [facility] at [phone] to schedule your spine injection.   Continue home exercises.   Consider PT.   Let me know how you are doing.

## 2021-02-23 ENCOUNTER — Other Ambulatory Visit: Payer: Self-pay

## 2021-02-23 ENCOUNTER — Ambulatory Visit
Admission: RE | Admit: 2021-02-23 | Discharge: 2021-02-23 | Disposition: A | Discharge status: Home / Self Care | Payer: 59 | Source: Ambulatory Visit | Attending: Family Medicine | Admitting: Family Medicine

## 2021-02-23 ENCOUNTER — Ambulatory Visit: Payer: 59 | Admitting: Family Medicine

## 2021-02-23 DIAGNOSIS — M545 Low back pain, unspecified: Secondary | ICD-10-CM

## 2021-02-23 DIAGNOSIS — G8929 Other chronic pain: Secondary | ICD-10-CM

## 2021-02-23 DIAGNOSIS — M47817 Spondylosis without myelopathy or radiculopathy, lumbosacral region: Secondary | ICD-10-CM | POA: Diagnosis not present

## 2021-02-23 DIAGNOSIS — M4306 Spondylolysis, lumbar region: Secondary | ICD-10-CM

## 2021-02-23 MED ORDER — METHYLPREDNISOLONE ACETATE 40 MG/ML INJ SUSP (RADIOLOG
120.0000 mg | Freq: Once | INTRAMUSCULAR | Status: AC
  Administered 2021-02-23: 120 mg via EPIDURAL

## 2021-02-23 MED ORDER — IOPAMIDOL (ISOVUE-M 200) INJECTION 41%
1.0000 mL | Freq: Once | INTRAMUSCULAR | Status: AC
  Administered 2021-02-23: 1 mL via EPIDURAL

## 2021-02-23 NOTE — Discharge Instructions (Signed)

## 2021-02-26 ENCOUNTER — Other Ambulatory Visit (HOSPITAL_BASED_OUTPATIENT_CLINIC_OR_DEPARTMENT_OTHER): Payer: Self-pay

## 2021-02-28 MED FILL — NEUPRO 1 MG/24 HR PATCH: 1 | 28 days supply | Qty: 28 | Fill #3

## 2021-03-06 MED FILL — UBRELVY 100 MG TABS: 100 | 30 days supply | Qty: 8 | Fill #5

## 2021-03-07 MED FILL — WEGOVY 2.4 MG/0.75ML SOAJ: 2.4 | 28 days supply | Qty: 3 | Fill #1

## 2021-03-15 ENCOUNTER — Other Ambulatory Visit (HOSPITAL_COMMUNITY): Payer: Self-pay

## 2021-04-06 ENCOUNTER — Ambulatory Visit: Payer: 59 | Admitting: Neurology

## 2021-04-14 ENCOUNTER — Other Ambulatory Visit: Payer: Self-pay | Admitting: Family Medicine

## 2021-04-14 ENCOUNTER — Other Ambulatory Visit (HOSPITAL_COMMUNITY): Payer: Self-pay

## 2021-04-14 MED FILL — Escitalopram Oxalate Tab 20 MG (Base Equiv): ORAL | 90 days supply | Qty: 90 | Fill #0 | Status: CN

## 2021-04-14 MED FILL — Ubrogepant Tab 100 MG: ORAL | 30 days supply | Qty: 8 | Fill #0 | Status: AC

## 2021-04-14 MED FILL — Semaglutide (Weight Mngmt) Soln Auto-Injector 2.4 MG/0.75ML: SUBCUTANEOUS | 28 days supply | Qty: 3 | Fill #0 | Status: AC

## 2021-04-14 MED FILL — Sodium Fluoride-Potassium Nitrate Gel 1.1-5%: DENTAL | Qty: 100 | Fill #0 | Status: CN

## 2021-04-16 ENCOUNTER — Other Ambulatory Visit (HOSPITAL_COMMUNITY): Payer: Self-pay

## 2021-04-16 MED ORDER — ESCITALOPRAM OXALATE 10 MG PO TABS
ORAL_TABLET | Freq: Every evening | ORAL | 1 refills | Status: DC
  Filled 2021-04-16: qty 90, 90d supply, fill #0
  Filled 2021-09-16: qty 90, 90d supply, fill #1

## 2021-04-17 ENCOUNTER — Other Ambulatory Visit (HOSPITAL_COMMUNITY): Payer: Self-pay

## 2021-04-18 ENCOUNTER — Other Ambulatory Visit (HOSPITAL_COMMUNITY): Payer: Self-pay

## 2021-04-18 MED FILL — Brimonidine Tartrate Gel 0.33% (Base Equivalent): CUTANEOUS | 30 days supply | Qty: 30 | Fill #0 | Status: AC

## 2021-04-18 MED FILL — Sodium Fluoride-Potassium Nitrate Gel 1.1-5%: DENTAL | 30 days supply | Qty: 100 | Fill #0 | Status: AC

## 2021-04-19 ENCOUNTER — Other Ambulatory Visit (HOSPITAL_COMMUNITY): Payer: Self-pay

## 2021-04-23 ENCOUNTER — Other Ambulatory Visit (HOSPITAL_COMMUNITY): Payer: Self-pay

## 2021-05-03 ENCOUNTER — Telehealth: Payer: 59 | Admitting: Physician Assistant

## 2021-05-03 DIAGNOSIS — L237 Allergic contact dermatitis due to plants, except food: Secondary | ICD-10-CM

## 2021-05-03 MED ORDER — PREDNISONE 10 MG PO TABS
ORAL_TABLET | ORAL | 0 refills | Status: AC
Start: 2021-05-03 — End: 2021-05-17

## 2021-05-03 NOTE — Progress Notes (Signed)
E-Visit for Poison Ivy  We are sorry that you are not feeing well.  Here is how we plan to help!  Based on what you have shared with me it looks like you have had an allergic reaction to the oily resin from a group of plants.  This resin is very sticky, so it easily attaches to your skin, clothing, tools equipment, and pet's fur.    This blistering rash is often called poison ivy rash although it can come from contact with the leaves, stems and roots of poison ivy, poison oak and poison sumac.  The oily resin contains urushiol (u-ROO-she-ol) that produces a skin rash on exposed skin.  The severity of the rash depends on the amount of urushiol that gets on your skin.  A section of skin with more urushiol on it may develop a rash sooner.  The rash usually develops 12-48 hours after exposure and can last two to three weeks.  Your skin must come in direct contact with the plant's oil to be affected.  Blister fluid doesn't spread the rash.  However, if you come into contact with a piece of clothing or pet fur that has urushiol on it, the rash may spread out.  You can also transfer the oil to other parts of your body with your fingers.  Often the rash looks like a straight line because of the way the plant brushes against your skin.  Since your rash is widespread or has resulted in a large number of blisters, I have prescribed an oral corticosteroid.  Please follow these recommendations:  I have sent a prednisone dose pack to your chosen pharmacy. Be sure to follow the instructions carefully and complete the entire prescription. You may use Benadryl or Caladryl topical lotions to sooth the itch and remember cool, not hot, showers and baths can help relieve the itching!  Place cool, wet compresses on the affected area for 15-30 minutes several times a day.  You may also take oral antihistamines, such as diphenhydramine (Benadryl, others), which may also help you sleep better.  Watch your skin for any purulent  (pus) drainage or red streaking from the site.  If this occurs, contact your provider.  You may require an antibiotic for a skin infection.  Make sure that the clothes you were wearing as well as any towels or sheets that may have come in contact with the oil (urushiol) are washed in detergent and hot water.       I have developed the following plan to treat your condition I am prescribing a two week course of steroids (37 tablets of 10 mg prednisone).  Days 1-4 take 4 tablets (40 mg) daily  Days 5-8 take 3 tablets (30 mg) daily, Days 9-11 take 2 tablets (20 mg) daily, Days 12-14 take 1 tablet (10 mg) daily.    What can you do to prevent this rash?  Avoid the plants.  Learn how to identify poison ivy, poison oak and poison sumac in all seasons.  When hiking or engaging in other activities that might expose you to these plants, try to stay on cleared pathways.  If camping, make sure you pitch your tent in an area free of these plants.  Keep pets from running through wooded areas so that urushiol doesn't accidentally stick to their fur, which you may touch.  Remove or kill the plants.  In your yard, you can get rid of poison ivy by applying an herbicide or pulling it out of   the ground, including the roots, while wearing heavy gloves.  Afterward remove the gloves and thoroughly wash them and your hands.  Don't burn poison ivy or related plants because the urushiol can be carried by smoke.  Wear protective clothing.  If needed, protect your skin by wearing socks, boots, pants, long sleeves and vinyl gloves.  Wash your skin right away.  Washing off the oil with soap and water within 30 minutes of exposure may reduce your chances of getting a poison ivy rash.  Even washing after an hour or so can help reduce the severity of the rash.  If you walk through some poison ivy and then later touch your shoes, you may get some urushiol on your hands, which may then transfer to your face or body by touching or  rubbing.  If the contaminated object isn't cleaned, the urushiol on it can still cause a skin reaction years later.    Be careful not to reuse towels after you have washed your skin.  Also carefully wash clothing in detergent and hot water to remove all traces of the oil.  Handle contaminated clothing carefully so you don't transfer the urushiol to yourself, furniture, rugs or appliances.  Remember that pets can carry the oil on their fur and paws.  If you think your pet may be contaminated with urushiol, put on some long rubber gloves and give your pet a bath.  Finally, be careful not to burn these plants as the smoke can contain traces of the oil.  Inhaling the smoke may result in difficulty breathing. If that occurred you should see a physician as soon as possible.  See your doctor right away if:  The reaction is severe or widespread You inhaled the smoke from burning poison ivy and are having difficulty breathing Your skin continues to swell The rash affects your eyes, mouth or genitals Blisters are oozing pus You develop a fever greater than 100 F (37.8 C) The rash doesn't get better within a few weeks.  If you scratch the poison ivy rash, bacteria under your fingernails may cause the skin to become infected.  See your doctor if pus starts oozing from the blisters.  Treatment generally includes antibiotics.  Poison ivy treatments are usually limited to self-care methods.  And the rash typically goes away on its own in two to three weeks.     If the rash is widespread or results in a large number of blisters, your doctor may prescribe an oral corticosteroid, such as prednisone.  If a bacterial infection has developed at the rash site, your doctor may give you a prescription for an oral antibiotic.  MAKE SURE YOU  Understand these instructions. Will watch your condition. Will get help right away if you are not doing well or get worse.  Thank you for choosing an e-visit. Your  e-visit answers were reviewed by a board certified advanced clinical practitioner to complete your personal care plan. Depending upon the condition, your plan could have included both over the counter or prescription medications.  Please review your pharmacy choice. If there is a problem you may use MyChart messaging to have the prescription routed to another pharmacy.   Your safety is important to us. If you have drug allergies check your prescription carefully.  You can use MyChart to ask questions about today's visit, request a non-urgent call back, or ask for a work or school excuse for 24 hours related to this e-Visit. If it has been greater than   24 hours you will need to follow up with your provider, or enter a new e-Visit to address those concerns.   You will get an email in the next two days asking about your experience. I hope that your e-visit has been valuable and will speed your recovery Thank you for choosing an e-visit.     

## 2021-05-03 NOTE — Progress Notes (Signed)
I have spent 5 minutes in review of e-visit questionnaire, review and updating patient chart, medical decision making and response to patient.   Samer Dutton Cody Lahela Woodin, PA-C    

## 2021-05-06 ENCOUNTER — Telehealth: Payer: 59 | Admitting: Physician Assistant

## 2021-05-06 DIAGNOSIS — H60332 Swimmer's ear, left ear: Secondary | ICD-10-CM

## 2021-05-06 MED ORDER — NEOMYCIN-POLYMYXIN-HC 3.5-10000-1 OT SOLN: 4.0000 [drp] | Freq: Four times a day (QID) | OTIC | 0 refills | Status: AC

## 2021-05-06 NOTE — Progress Notes (Signed)
E Visit for Swimmer's Ear  We are sorry that you are not feeling well. Here is how we plan to help!  Based on what you have shared with me it looks like you have swimmers ear. Swimmer's ear is a redness or swelling, irritation, or infection of your outer ear canal.  These symptoms usually occur within a few days of swimming.  Your ear canal is a tube that goes from the opening of the ear to the eardrum.  When water stays in your ear canal, germs can grow.  This is a painful condition that often happens to children and swimmers of all ages.  It is not contagious and oral antibiotics are not required to treat uncomplicated swimmer's ear.  The usual symptoms include: Itching inside the ear, Redness or a sense of swelling in the ear, Pain when the ear is tugged on when pressure is placed on the ear, Pus draining from the infected ear. and I have prescribed: Neomycin 0.35%, polymyxin B 10,000 units/mL, and hydrocortisone 0,5% otic solution 4 drops in affected ears four times a day for 7 days  Hello Dr. Dalbert Garnet, hope you feel better soon!    In certain cases swimmer's ear may progress to a more serious bacterial infection of the middle or inner ear.  If you have a fever 102 and up and significantly worsening symptoms, this could indicate a more serious infection moving to the middle/inner and needs face to face evaluation in an office by a provider.  Your symptoms should improve over the next 3 days and should resolve in about 7 days.  HOME CARE:   Wash your hands frequently.  Do not place the tip of the bottle on your ear or touch it with your fingers.  You can take Acetominophen 650 mg every 4-6 hours as needed for pain.  If pain is severe or moderate, you can apply a heating pad (set on low) or hot water bottle (wrapped in a towel) to outer ear for 20 minutes.  This will also increase drainage.  Avoid ear plugs  Do not use Q-tips  After showers, help the water run out by tilting your head to  one side.  GET HELP RIGHT AWAY IF:   Fever is over 102.2 degrees.  You develop progressive ear pain or hearing loss.  Ear symptoms persist longer than 3 days after treatment.  MAKE SURE YOU:   Understand these instructions.  Will watch your condition.  Will get help right away if you are not doing well or get worse.  TO PREVENT SWIMMER'S EAR:  Use a bathing cap or custom fitted swim molds to keep your ears dry.  Towel off after swimming to dry your ears.  Tilt your head or pull your earlobes to allow the water to escape your ear canal.  If there is still water in your ears, consider using a hairdryer on the lowest setting.  Thank you for choosing an e-visit. Your e-visit answers were reviewed by a board certified advanced clinical practitioner to complete your personal care plan. Depending upon the condition, your plan could have included both over the counter or prescription medications. Please review your pharmacy choice. Be sure that the pharmacy you have chosen is open so that you can pick up your prescription now.  If there is a problem you may message your provider in MyChart to have the prescription routed to another pharmacy. Your safety is important to [place]. If you have drug allergies check your prescription  carefully.  For the next 24 hours, you can use MyChart to ask questions about today's visit, request a non-urgent call back, or ask for a work or school excuse from your e-visit provider. You will get an email in the next two days asking about your experience. I hope that your e-visit has been valuable and will speed your recovery.      I spent 5-10 minutes on review and completion of this note- [facility]

## 2021-05-13 MED FILL — Escitalopram Oxalate Tab 20 MG (Base Equiv): ORAL | 90 days supply | Qty: 90 | Fill #0 | Status: AC

## 2021-05-14 ENCOUNTER — Other Ambulatory Visit (HOSPITAL_COMMUNITY): Payer: Self-pay

## 2021-05-15 ENCOUNTER — Encounter: Payer: Self-pay | Admitting: Family Medicine

## 2021-05-15 DIAGNOSIS — M4306 Spondylolysis, lumbar region: Secondary | ICD-10-CM

## 2021-05-15 DIAGNOSIS — M545 Low back pain, unspecified: Secondary | ICD-10-CM

## 2021-05-15 DIAGNOSIS — G8929 Other chronic pain: Secondary | ICD-10-CM

## 2021-05-18 ENCOUNTER — Other Ambulatory Visit: Payer: Self-pay

## 2021-05-18 ENCOUNTER — Ambulatory Visit
Admission: RE | Admit: 2021-05-18 | Discharge: 2021-05-18 | Disposition: A | Discharge status: Home / Self Care | Payer: 59 | Source: Ambulatory Visit | Attending: Family Medicine | Admitting: Family Medicine

## 2021-05-18 DIAGNOSIS — M4306 Spondylolysis, lumbar region: Secondary | ICD-10-CM

## 2021-05-18 DIAGNOSIS — G8929 Other chronic pain: Secondary | ICD-10-CM

## 2021-05-18 DIAGNOSIS — M47817 Spondylosis without myelopathy or radiculopathy, lumbosacral region: Secondary | ICD-10-CM | POA: Diagnosis not present

## 2021-05-18 MED ORDER — METHYLPREDNISOLONE ACETATE 40 MG/ML INJ SUSP (RADIOLOG
80.0000 mg | Freq: Once | INTRAMUSCULAR | Status: AC
  Administered 2021-05-18: 80 mg via EPIDURAL

## 2021-05-18 MED ORDER — IOPAMIDOL (ISOVUE-M 200) INJECTION 41%
1.0000 mL | Freq: Once | INTRAMUSCULAR | Status: AC
  Administered 2021-05-18: 1 mL via EPIDURAL

## 2021-05-18 NOTE — Discharge Instructions (Signed)
Post Procedure Spinal Discharge Instruction Sheet  You may resume a regular diet and any medications that you routinely take (including pain medications).  No driving day of procedure.  Light activity throughout the rest of the day.  Do not do any strenuous work, exercise, bending or lifting.  The day following the procedure, you can resume normal physical activity but you should refrain from exercising or physical therapy for at least three days thereafter.   Common Side Effects:  Headaches- take your usual medications as directed by your physician.  Increase your fluid intake.  Caffeinated beverages may be helpful.  Lie flat in bed until your headache resolves.  Restlessness or inability to sleep- you may have trouble sleeping for the next few days.  Ask your referring physician if you need any medication for sleep.  Facial flushing or redness- should subside within a few days.  Increased pain- a temporary increase in pain a day or two following your procedure is not unusual.  Take your pain medication as prescribed by your referring physician.  Leg cramps  Please contact our office at [phone] for the following symptoms: Fever greater than 100 degrees. Headaches unresolved with medication after 2-3 days. Increased swelling, pain, or redness at injection site.   Thank you for visiting [facility] today.   

## 2021-05-19 ENCOUNTER — Telehealth: Payer: 59 | Admitting: Orthopedic Surgery

## 2021-05-19 DIAGNOSIS — R21 Rash and other nonspecific skin eruption: Secondary | ICD-10-CM | POA: Diagnosis not present

## 2021-05-19 DIAGNOSIS — W5501XA Bitten by cat, initial encounter: Secondary | ICD-10-CM | POA: Diagnosis not present

## 2021-05-19 MED ORDER — AMOXICILLIN-POT CLAVULANATE 875-125 MG PO TABS: 1.0000 | ORAL_TABLET | Freq: Two times a day (BID) | ORAL | 0 refills | Status: AC

## 2021-05-19 NOTE — Progress Notes (Signed)
E visit for Simple Cut/Laceration  We are sorry that you have had an injury. Here is how we plan to help!  I have prescribed Augmentin, 875 mg by mouth twice daily for three days. This is to try to prevent the bite from getting infected.  HOME CARE: Clean the cut or scrape - Wash it well with soap and water. * avoid using hydrogen peroxide which may cause tissue damage, or impede wound healing.  Stop the bleeding - If your cut or scrape is bleeding, press a clean cloth or bandage firmly on the area for 20 minutes. You can also help slow the bleeding by holding the cut above the level of your heart.   Put a thin layer of Bacitracin antibiotic ointment on the cut or scrape. (this can be purchased at any local pharmacy- ask your pharmacist if you need assistance)   Cover the cut or scrape with a bandage or gauze. Keep the bandage clean and dry. Change the bandage 1 to 2 times every day until your cut or scrape heals.   Watch for signs that your cut or scrape is infected (redness, drainage, pain, warmth, swelling or fever)  Over the next 48 hours your wound should start to improve with less pain, less swelling and less redness. If you should develop increasing pain, swelling, redness, fever, pus from the wound you should be seen immediately to make sure this is not becoming infected.   WOUND CARE: Please keep a layer of antibiotic ointment (bacitracin preferred) on this wound at least twice a day for the next seven days and keep a sterile dressing over top of it. You may gently clean the wound with warm soap and water between dressing changes.  We strongly recommend that you have a medical provider reevaluate your wound within 2 to 3 days in person to make sure that it is healing appropriately.  Thank you for choosing an e-visit. Your e-visit answers were reviewed by a board certified advanced clinical practitioner to complete your personal care plan. Depending upon the condition, your plan  could have included both over the counter or prescription medications. Please review your pharmacy choice. Make sure the pharmacy is open so you can pick up prescription now. If there is a problem, you may contact your provider through [employer] and have the prescription routed to another pharmacy. Your safety is important to [place]. If you have drug allergies check your prescription carefully.  For the next 24 hours you can use MyChart to ask questions about today's visit, request a non-urgent call back, or ask for a work or school excuse.  You will get an email with a link to our survey asking about your experience. I hope that your e-visit has been valuable and will speed your recovery    Greater than 5 minutes, yet less than 10 minutes of time have been spent researching, coordinating and implementing care for this patient today.

## 2021-05-21 ENCOUNTER — Other Ambulatory Visit (HOSPITAL_COMMUNITY): Payer: Self-pay

## 2021-05-21 MED FILL — Ubrogepant Tab 100 MG: ORAL | 30 days supply | Qty: 8 | Fill #1 | Status: AC

## 2021-05-21 MED FILL — Semaglutide (Weight Mngmt) Soln Auto-Injector 2.4 MG/0.75ML: SUBCUTANEOUS | 28 days supply | Qty: 3 | Fill #1 | Status: AC

## 2021-05-21 MED FILL — Ubrogepant Tab 100 MG: ORAL | 30 days supply | Qty: 8 | Fill #1 | Status: CN

## 2021-05-21 MED FILL — Semaglutide (Weight Mngmt) Soln Auto-Injector 2.4 MG/0.75ML: SUBCUTANEOUS | 28 days supply | Qty: 3 | Fill #1 | Status: CN

## 2021-05-22 ENCOUNTER — Telehealth: Payer: Self-pay | Admitting: *Deleted

## 2021-05-22 ENCOUNTER — Other Ambulatory Visit (HOSPITAL_COMMUNITY): Payer: Self-pay

## 2021-05-22 NOTE — Telephone Encounter (Signed)
The authorization is effective for a maximum of 12 fill(s) from 05/22/2021 to 05/21/2022, as long as you are enrolled as a member of your current health plan.

## 2021-05-22 NOTE — Telephone Encounter (Signed)
Prior auth started for [facility] 2.4mg /0.19ml via cover my meds.  Awaiting determination.  Key: [id number]

## 2021-05-23 ENCOUNTER — Other Ambulatory Visit (HOSPITAL_COMMUNITY): Payer: Self-pay

## 2021-05-23 ENCOUNTER — Encounter: Payer: Self-pay | Admitting: *Deleted

## 2021-05-23 ENCOUNTER — Telehealth: Payer: Self-pay | Admitting: *Deleted

## 2021-05-23 MED ORDER — PREVIDENT 5000 BOOSTER PLUS 1.1 % DT PSTE
PASTE | DENTAL | 12 refills | Status: DC
  Filled 2021-05-23: qty 100, 30d supply, fill #0
  Filled 2021-06-27 – 2021-06-29 (×2): qty 100, 30d supply, fill #1
  Filled 2021-09-16: qty 100, 30d supply, fill #2

## 2021-05-23 NOTE — Telephone Encounter (Signed)
Bernita Raisin PA, key: [id number], failed lexapro, flexeril, side effects to topamax. [employer] immediately approved until 05/22/22 for a max 12 fills, quantity limit of 16 pills for 30 days. Sent my chart to inform patient.

## 2021-05-28 ENCOUNTER — Other Ambulatory Visit (HOSPITAL_COMMUNITY): Payer: Self-pay

## 2021-05-29 ENCOUNTER — Other Ambulatory Visit (HOSPITAL_COMMUNITY): Payer: Self-pay

## 2021-06-01 DIAGNOSIS — E119 Type 2 diabetes mellitus without complications: Secondary | ICD-10-CM | POA: Diagnosis not present

## 2021-06-01 DIAGNOSIS — H5213 Myopia, bilateral: Secondary | ICD-10-CM | POA: Diagnosis not present

## 2021-06-01 LAB — HM DIABETES EYE EXAM

## 2021-06-13 ENCOUNTER — Other Ambulatory Visit (HOSPITAL_COMMUNITY): Payer: Self-pay

## 2021-06-15 ENCOUNTER — Ambulatory Visit (INDEPENDENT_AMBULATORY_CARE_PROVIDER_SITE_OTHER): Payer: 59 | Admitting: Neurology

## 2021-06-15 DIAGNOSIS — G43709 Chronic migraine without aura, not intractable, without status migrainosus: Secondary | ICD-10-CM

## 2021-06-15 NOTE — Progress Notes (Signed)
B/B BOTOX 200units NDC [id number], LOT [id number] 01/2025exp.   NaCL NDC 30940-768-08 LOT [id number] exp 01/2022.

## 2021-06-17 NOTE — Progress Notes (Addendum)
Consent Form Botulism Toxin Injection For Chronic Migraine Fantastic response> 70% improvement migraine and headache freq and severity.    Reviewed orally with patient, additionally signature is on file:  Botulism toxin has been approved by the Federal drug administration for treatment of chronic migraine. Botulism toxin does not cure chronic migraine and it may not be effective in some patients.  The administration of botulism toxin is accomplished by injecting a small amount of toxin into the muscles of the neck and head. Dosage must be titrated for each individual. Any benefits resulting from botulism toxin tend to wear off after 3 months with a repeat injection required if benefit is to be maintained. Injections are usually done every 3-4 months with maximum effect peak achieved by about 2 or 3 weeks. Botulism toxin is expensive and you should be sure of what costs you will incur resulting from the injection.  The side effects of botulism toxin use for chronic migraine may include:   -Transient, and usually mild, facial weakness with facial injections  -Transient, and usually mild, head or neck weakness with head/neck injections  -Reduction or loss of forehead facial animation due to forehead muscle weakness  -Eyelid drooping  -Dry eye  -Pain at the site of injection or bruising at the site of injection  -Double vision  -Potential unknown long term risks  Contraindications: You should not have Botox if you are pregnant, nursing, allergic to albumin, have an infection, skin condition, or muscle weakness at the site of the injection, or have myasthenia gravis, Lambert-Eaton syndrome, or ALS.  It is also possible that as with any injection, there may be an allergic reaction or no effect from the medication. Reduced effectiveness after repeated injections is sometimes seen and rarely infection at the injection site may occur. All care will be taken to prevent these side effects. If therapy is  given over a long time, atrophy and wasting in the muscle injected may occur. Occasionally the patient's become refractory to treatment because they develop antibodies to the toxin. In this event, therapy needs to be modified.  I have read the above information and consent to the administration of botulism toxin.    BOTOX PROCEDURE NOTE FOR MIGRAINE HEADACHE    Contraindications and precautions discussed with patient(above). Aseptic procedure was observed and patient tolerated procedure. Procedure performed by Dr. Artemio Aly  The condition has existed for more than 6 months, and pt does not have a diagnosis of ALS, Myasthenia Gravis or Lambert-Eaton Syndrome.  Risks and benefits of injections discussed and pt agrees to proceed with the procedure.  Written consent obtained  These injections are medically necessary. Pt  receives good benefits from these injections. These injections do not cause sedations or hallucinations which the oral therapies may cause.  Description of procedure:  The patient was placed in a sitting position. The standard protocol was used for Botox as follows, with 5 units of Botox injected at each site:   -Procerus muscle, midline injection  -Corrugator muscle, bilateral injection  -Frontalis muscle, bilateral injection, with 2 sites each side, medial injection was performed in the upper one third of the frontalis muscle, in the region vertical from the medial inferior edge of the superior orbital rim. The lateral injection was again in the upper one third of the forehead vertically above the lateral limbus of the cornea, 1.5 cm lateral to the medial injection site.  -Temporalis muscle injection, 4 sites, bilaterally. The first injection was 3 cm above the tragus of  the ear, second injection site was 1.5 cm to 3 cm up from the first injection site in line with the tragus of the ear. The third injection site was 1.5-3 cm forward between the first 2 injection sites. The  fourth injection site was 1.5 cm posterior to the second injection site.   -Occipitalis muscle injection, 3 sites, bilaterally. The first injection was done one half way between the occipital protuberance and the tip of the mastoid process behind the ear. The second injection site was done lateral and superior to the first, 1 fingerbreadth from the first injection. The third injection site was 1 fingerbreadth superiorly and medially from the first injection site.  -Cervical paraspinal muscle injection, 2 sites, bilateral knee first injection site was 1 cm from the midline of the cervical spine, 3 cm inferior to the lower border of the occipital protuberance. The second injection site was 1.5 cm superiorly and laterally to the first injection site.  -Trapezius muscle injection was performed at 3 sites, bilaterally. The first injection site was in the upper trapezius muscle halfway between the inflection point of the neck, and the acromion. The second injection site was one half way between the acromion and the first injection site. The third injection was done between the first injection site and the inflection point of the neck.   Will return for repeat injection in 3 months.   155 units of Botox was used, 45U Botox not injected was wasted. The patient tolerated the procedure well, there were no complications of the above procedure.

## 2021-06-25 ENCOUNTER — Ambulatory Visit (INDEPENDENT_AMBULATORY_CARE_PROVIDER_SITE_OTHER): Payer: 59 | Admitting: Family Medicine

## 2021-06-25 ENCOUNTER — Other Ambulatory Visit (HOSPITAL_COMMUNITY): Payer: Self-pay

## 2021-06-25 VITALS — BP 114/76 | HR 74 | Temp 98.5°F | Resp 16 | Wt 217.8 lb

## 2021-06-25 DIAGNOSIS — E669 Obesity, unspecified: Secondary | ICD-10-CM

## 2021-06-25 DIAGNOSIS — E782 Mixed hyperlipidemia: Secondary | ICD-10-CM

## 2021-06-25 DIAGNOSIS — I2510 Atherosclerotic heart disease of native coronary artery without angina pectoris: Secondary | ICD-10-CM | POA: Diagnosis not present

## 2021-06-25 DIAGNOSIS — E559 Vitamin D deficiency, unspecified: Secondary | ICD-10-CM | POA: Diagnosis not present

## 2021-06-25 DIAGNOSIS — E1169 Type 2 diabetes mellitus with other specified complication: Secondary | ICD-10-CM

## 2021-06-25 DIAGNOSIS — I2584 Coronary atherosclerosis due to calcified coronary lesion: Secondary | ICD-10-CM

## 2021-06-25 DIAGNOSIS — E6609 Other obesity due to excess calories: Secondary | ICD-10-CM

## 2021-06-25 LAB — COMPREHENSIVE METABOLIC PANEL
ALT: 30 U/L (ref 0–35)
AST: 23 U/L (ref 0–37)
Albumin: 4.2 g/dL (ref 3.5–5.2)
Alkaline Phosphatase: 75 U/L (ref 39–117)
BUN: 12 mg/dL (ref 6–23)
CO2: 27 mEq/L (ref 19–32)
Calcium: 9.6 mg/dL (ref 8.4–10.5)
Chloride: 102 mEq/L (ref 96–112)
Creatinine, Ser: 0.88 mg/dL (ref 0.40–1.20)
GFR: 76.36 mL/min (ref >60.00)
Glucose, Bld: 79 mg/dL (ref 70–99)
Potassium: 4.4 mEq/L (ref 3.5–5.1)
Sodium: 136 mEq/L (ref 135–145)
Total Bilirubin: 1 mg/dL (ref 0.2–1.2)
Total Protein: 7.1 g/dL (ref 6.0–8.3)

## 2021-06-25 LAB — TSH: TSH: 3.1 u[IU]/mL (ref 0.35–5.50)

## 2021-06-25 LAB — CBC WITH DIFFERENTIAL/PLATELET
Basophils Absolute: 0.1 10*3/uL (ref 0.0–0.1)
Basophils Relative: 0.9 % (ref 0.0–3.0)
Eosinophils Absolute: 0.1 10*3/uL (ref 0.0–0.7)
Eosinophils Relative: 1.5 % (ref 0.0–5.0)
HCT: 39.9 % (ref 36.0–46.0)
Hemoglobin: 13.5 g/dL (ref 12.0–15.0)
Lymphocytes Relative: 34.2 % (ref 12.0–46.0)
Lymphs Abs: 2.7 10*3/uL (ref 0.7–4.0)
MCHC: 33.9 g/dL (ref 30.0–36.0)
MCV: 94.2 fl (ref 78.0–100.0)
Monocytes Absolute: 0.6 10*3/uL (ref 0.1–1.0)
Monocytes Relative: 7.2 % (ref 3.0–12.0)
Neutro Abs: 4.4 10*3/uL (ref 1.4–7.7)
Neutrophils Relative %: 56.2 % (ref 43.0–77.0)
Platelets: 324 10*3/uL (ref 150.0–400.0)
RBC: 4.23 Mil/uL (ref 3.87–5.11)
RDW: 13.4 % (ref 11.5–15.5)
WBC: 7.9 10*3/uL (ref 4.0–10.5)

## 2021-06-25 LAB — LIPID PANEL
Cholesterol: 131 mg/dL (ref 0–200)
HDL: 50.5 mg/dL (ref >39.00)
LDL Cholesterol: 57 mg/dL (ref 0–99)
NonHDL: 80.39
Total CHOL/HDL Ratio: 3
Triglycerides: 115 mg/dL (ref 0.0–149.0)
VLDL: 23 mg/dL (ref 0.0–40.0)

## 2021-06-25 LAB — HEMOGLOBIN A1C: Hgb A1c MFr Bld: 5.6 % (ref 4.6–6.5)

## 2021-06-25 MED ORDER — TIRZEPATIDE 15 MG/0.5ML ~~LOC~~ SOAJ
15.0000 mg | SUBCUTANEOUS | 5 refills | Status: DC
  Filled 2021-06-25: qty 6, 84d supply, fill #0
  Filled 2021-09-16: qty 6, 84d supply, fill #1
  Filled 2021-12-16: qty 2, 28d supply, fill #2
  Filled 2022-01-11: qty 2, 28d supply, fill #3
  Filled 2022-02-12: qty 2, 28d supply, fill #4
  Filled 2022-03-10: qty 2, 28d supply, fill #5
  Filled 2022-04-14: qty 2, 28d supply, fill #6
  Filled 2022-05-15: qty 2, 28d supply, fill #7
  Filled 2022-06-06: qty 2, 28d supply, fill #8

## 2021-06-25 MED ORDER — CARESTART COVID-19 HOME TEST VI KIT
PACK | 0 refills | Status: DC
  Filled 2021-06-25: qty 4, 4d supply, fill #0

## 2021-06-25 NOTE — Progress Notes (Signed)
Subjective:    Patient ID: KAMIA INSALACO, female    DOB: 01-01-1971, 50 y.o.   MRN: [id number]  Chief Complaint  Patient presents with   Follow-up   Diabetes    Pt would like left ear looked at. Pt would like to discuss medication. Referral to [facility].    HPI Patient is in today for follow upon chronic medical concerns including diabetes, CAD and DM. No recent febrile illness or hospitalizations. No complaints of polyuria or polydipsia. Denies CP/palp/SOB/HA/congestion/fevers/GI or GU c/o. Taking meds as prescribed   Past Medical History:  Diagnosis Date   Allergy    Cervical cancer screening 05/02/2017   Depression    Depression with anxiety    Diabetes mellitus type 2 in obese [facility])    Diabetes mellitus without complication ([place])    History of chicken pox    Hyperlipidemia    Low back pain    Migraines    Obesity 11/10/2016    Past Surgical History:  Procedure Laterality Date   ABDOMINOPLASTY  2012   AUGMENTATION MAMMAPLASTY     maxilift with implants    BLADDER SUSPENSION  2016   LAPAROSCOPIC GASTRIC BANDING  2005   esophageal dilation. band still present but released   MASTOPEXY  2012    Family History  Problem Relation Age of Onset   Diabetes Mother    Hypertension Mother    Hyperlipidemia Mother    Obesity Mother    Heart disease Mother        pacer   Diabetes Father    Heart disease Father        MI   Hyperlipidemia Father    Hypertension Father    Obesity Father    Diabetes Sister    Hyperlipidemia Sister    Hypertension Sister    Obesity Sister    Obesity Maternal Grandmother    Hypertension Maternal Grandmother    Hyperlipidemia Maternal Grandmother    Diabetes Maternal Grandmother    Rosacea Maternal Grandmother    Non-Hodgkin's lymphoma Maternal Grandmother    Arthritis Maternal Grandmother        spinal stenosis   Multiple myeloma Maternal Grandfather    Hyperlipidemia Paternal Grandmother        rhabdo from statins   Kidney  disease Paternal Grandmother    Migraines Neg Hx     Social History   Socioeconomic History   Marital status: Married    Spouse name: Not on file   Number of children: 1   Years of education: MD   Highest education level: Not on file  Occupational History   Occupation: [employer]  Tobacco Use   Smoking status: Never   Smokeless tobacco: Never  Substance and Sexual Activity   Alcohol use: Yes    Alcohol/week: 5.0 standard drinks    Types: 5 Glasses of wine per week    Comment: 3-5 glasses of wine per week   Drug use: No   Sexual activity: Not on file  Other Topics Concern   Not on file  Social History Narrative   Works With [employer], lives with husband, 3 cats   No major dietary restrictions. Exercise 30 minutes 3 x a week   Seat belts routinely   Right-handed   Caffeine: 1-2 cups of coffee per day   Social Determinants of Health   Financial Resource Strain: Not on file  Food Insecurity: Not on file  Transportation Needs: Not on file  Physical Activity: Not on  file  Stress: Not on file  Social Connections: Not on file  Intimate Partner Violence: Not on file    Outpatient Medications Prior to Visit  Medication Sig Dispense Refill   aspirin EC 81 MG tablet Take 1 tablet (81 mg total) by mouth daily. Swallow whole. 90 tablet 3   atorvastatin (LIPITOR) 20 MG tablet Take 1 tablet (20 mg total) by mouth daily. 90 tablet 3   Brimonidine Tartrate 0.33 % GEL APPLY DAILY AS DIRECTED 30 g 3   Cetirizine HCl 10 MG CAPS Take 1 capsule by mouth daily.     cyclobenzaprine (FLEXERIL) 10 MG tablet TAKE 1 TABLET BY MOUTH 3 TIMES DAILY AS NEEDED FOR MUSCLE SPASM 30 tablet 2   Erenumab-aooe (AIMOVIG) 140 MG/ML SOAJ Inject 140 mg into the skin every 30 (thirty) days. 1 pen 5   escitalopram (LEXAPRO) 10 MG tablet TAKE 1 TABLET BY MOUTH EVERY EVENING 90 tablet 1   escitalopram (LEXAPRO) 20 MG tablet TAKE 1 TABLET BY MOUTH EVERY MORNING 90 tablet 1   Rotigotine 1 MG/24HR PT24 APPLY 1 PATCH  ONTO THE SKIN AT BEDTIME AS NEEDED. TAKE OFF AND THROW AWAY IN THE MORNING 28 patch 6   Semaglutide-Weight Management 2.4 MG/0.75ML SOAJ INJECT 2.4 MG INTO THE SKIN ONCE A WEEK. 3 mL 5   Sod Fluoride-Potassium Nitrate 1.1-5 % GEL BRUSH ON TEETH TWO TIMES DAILY FOR 2 MINUTES EACH TIME, EXPECTORATE AS MUCH AS POSSIBLE THEN DO NOT SWISH, EAT OR DRINK FOR 30 MINUTES. 100 mL 3   Sodium Fluoride (PREVIDENT 5000 BOOSTER PLUS) 1.1 % PSTE Brush onto teeth twice daily for 2 minutes each time. Spit out as much as possible. DO NOT swish, eat or drink for 30 minutes after treatment 100 mL 12   SODIUM FLUORIDE 5000 SENSITIVE 1.1-5 % GEL Take by mouth.     tretinoin (RETIN-A) 0.025 % cream Apply topically at bedtime. To affected areas 45 g 5   Ubrogepant 100 MG TABS TAKE 1 TABLET BY MOUTH EVERY 2 HOURS AS NEEDED. MAXIMUM OF 2 TABLETS PER DAY. 8 tablet 11   nitrofurantoin, macrocrystal-monohydrate, (MACROBID) 100 MG capsule Take 1 capsule (100 mg total) by mouth 2 (two) times daily as needed. 20 capsule 1   Facility-Administered Medications Prior to Visit  Medication Dose Route Frequency Provider Last Rate Last Admin   gadobenate dimeglumine (MULTIHANCE) injection 20 mL  20 mL Intravenous Once PRN Melvenia Beam, MD       sodium chloride flush (NS) 0.9 % injection 10 mL  10 mL Intravenous PRN Melvenia Beam, MD   20 mL at 10/26/20 1345    Allergies  Allergen Reactions   Erythromycin Diarrhea    Review of Systems  Constitutional:  Negative for fever and malaise/fatigue.  HENT:  Negative for congestion.   Eyes:  Negative for blurred vision.  Respiratory:  Negative for shortness of breath.   Cardiovascular:  Negative for chest pain, palpitations and leg swelling.  Gastrointestinal:  Negative for abdominal pain, blood in stool and nausea.  Genitourinary:  Negative for dysuria and frequency.  Musculoskeletal:  Negative for falls.  Skin:  Negative for rash.  Neurological:  Negative for dizziness, loss of  consciousness and headaches.  Endo/Heme/Allergies:  Negative for environmental allergies.  Psychiatric/Behavioral:  Negative for depression. The patient is not nervous/anxious.       Objective:    Physical Exam Constitutional:      General: She is not in acute distress.    Appearance:  She is well-developed. She is not diaphoretic.  HENT:     Head: Normocephalic and atraumatic.     Right Ear: External ear normal.     Left Ear: External ear normal.     Nose: Nose normal.     Mouth/Throat:     Pharynx: No oropharyngeal exudate.  Eyes:     General: No scleral icterus.       Right eye: No discharge.        Left eye: No discharge.     Conjunctiva/sclera: Conjunctivae normal.     Pupils: Pupils are equal, round, and reactive to light.  Neck:     Thyroid: No thyromegaly.  Cardiovascular:     Rate and Rhythm: Normal rate and regular rhythm.     Heart sounds: Normal heart sounds. No murmur heard. Pulmonary:     Effort: Pulmonary effort is normal. No respiratory distress.     Breath sounds: Normal breath sounds. No wheezing or rales.  Abdominal:     General: Bowel sounds are normal. There is no distension.     Palpations: Abdomen is soft. There is no mass.     Tenderness: There is no abdominal tenderness.  Musculoskeletal:        General: No tenderness. Normal range of motion.     Cervical back: Normal range of motion and neck supple.  Lymphadenopathy:     Cervical: No cervical adenopathy.  Skin:    General: Skin is warm and dry.     Findings: No rash.  Neurological:     Mental Status: She is alert and oriented to person, place, and time.     Cranial Nerves: No cranial nerve deficit.     Coordination: Coordination normal.     Deep Tendon Reflexes: Reflexes are normal and symmetric. Reflexes normal.  Psychiatric:        Behavior: Behavior normal.    BP 114/76   Pulse 74   Temp 98.5 F (36.9 C)   Resp 16   Wt 217 lb 12.8 oz (98.8 kg)   SpO2 97%   BMI 35.15 kg/m  Wt  Readings from Last 3 Encounters:  06/25/21 217 lb 12.8 oz (98.8 kg)  02/16/21 218 lb 3.2 oz (99 kg)  12/15/20 215 lb (97.5 kg)    Diabetic Foot Exam - Simple   No data filed    Lab Results  Component Value Date   WBC 8.0 11/10/2020   HGB 13.5 11/10/2020   HCT 39.7 11/10/2020   PLT 257.0 11/10/2020   GLUCOSE 90 11/10/2020   CHOL 162 11/10/2020   TRIG 100.0 11/10/2020   HDL 65.00 11/10/2020   LDLCALC 77 11/10/2020   ALT 29 11/10/2020   AST 20 11/10/2020   NA 137 11/10/2020   K 4.7 11/10/2020   CL 104 11/10/2020   CREATININE 0.85 11/10/2020   BUN 16 11/10/2020   CO2 26 11/10/2020   TSH 2.83 11/10/2020   INR 1.04 09/17/2016   HGBA1C 5.3 11/10/2020   MICROALBUR <0.7 09/10/2019    Lab Results  Component Value Date   TSH 2.83 11/10/2020   Lab Results  Component Value Date   WBC 8.0 11/10/2020   HGB 13.5 11/10/2020   HCT 39.7 11/10/2020   MCV 95.5 11/10/2020   PLT 257.0 11/10/2020   Lab Results  Component Value Date   NA 137 11/10/2020   K 4.7 11/10/2020   CO2 26 11/10/2020   GLUCOSE 90 11/10/2020   BUN 16 11/10/2020   CREATININE   0.85 11/10/2020   BILITOT 1.6 (H) 11/10/2020   ALKPHOS 60 11/10/2020   AST 20 11/10/2020   ALT 29 11/10/2020   PROT 7.0 11/30/2020   ALBUMIN 3.9 11/10/2020   CALCIUM 9.3 11/10/2020   ANIONGAP 5 09/17/2016   GFR 79.96 11/10/2020   Lab Results  Component Value Date   CHOL 162 11/10/2020   Lab Results  Component Value Date   HDL 65.00 11/10/2020   Lab Results  Component Value Date   LDLCALC 77 11/10/2020   Lab Results  Component Value Date   TRIG 100.0 11/10/2020   Lab Results  Component Value Date   CHOLHDL 2 11/10/2020   Lab Results  Component Value Date   HGBA1C 5.3 11/10/2020       Assessment & Plan:   Problem List Items Addressed This Visit     Diabetes mellitus type 2 in obese (HCC)    hgba1c acceptable, minimize simple carbs. Increase exercise as tolerated. Continue current meds. Will try a switch  from Wegovy to Mounjaro 15 mg dose per week       Relevant Medications   tirzepatide (MOUNJARO) 15 MG/0.5ML Pen   Other Relevant Orders   Hemoglobin A1c   Ambulatory referral to [facility]   Hyperlipidemia    With high coronary calcium score on recent screen of 109 will switch from Lipitor to Crestor once labs available       Relevant Orders   CBC with Differential/Platelet   Comprehensive metabolic panel   TSH   Lipid panel   Obesity    Encouraged heart healthy diet, decrease po intake and increase exercise as tolerated. Needs 7-8 hours of sleep nightly. Avoid processed foods, eat small, frequent meals every 4-5 hours with lean proteins, complex carbs and healthy fats. Minimize simple carbs, high fat foods and processed foods.        Relevant Medications   tirzepatide (MOUNJARO) 15 MG/0.5ML Pen   Other Relevant Orders   Ambulatory referral to [facility]   Vitamin D deficiency - Primary    Supplement and monitor       Relevant Orders   Vitamin D 1,25 dihydroxy   CAD (coronary artery disease)    Coronary calcium score of 109 asymptomatic. Will adjust statin and advised to increase exercise and minimize carbohydrates. Referred [facility] for [facility]       Relevant Orders   Ambulatory referral to [facility]    I have discontinued Dr. Randell D. Ragain's nitrofurantoin (macrocrystal-monohydrate). I am also having her start on tirzepatide. Additionally, I am having her maintain her Cetirizine HCl, Erenumab-aooe, Sodium Fluoride 5000 Sensitive, aspirin EC, atorvastatin, tretinoin, Semaglutide-Weight Management, escitalopram, Brimonidine Tartrate, cyclobenzaprine, Rotigotine, Ubrogepant, Sod Fluoride-Potassium Nitrate, escitalopram, and PreviDent 5000 Booster Plus.  Meds ordered this encounter  Medications   tirzepatide (MOUNJARO) 15 MG/0.5ML Pen    Sig: Inject 15 mg into the skin once a week.    Dispense:  6  mL    Refill:  5    Patient switching from max dose of Wegovy     Stacey Blyth, MD  

## 2021-06-25 NOTE — Assessment & Plan Note (Signed)
Encouraged heart healthy diet, decrease po intake and increase exercise as tolerated. Needs 7-8 hours of sleep nightly. Avoid processed foods, eat small, frequent meals every 4-5 hours with lean proteins, complex carbs and healthy fats. Minimize simple carbs, high fat foods and processed foods.

## 2021-06-25 NOTE — Assessment & Plan Note (Signed)
With high coronary calcium score on recent screen of 109 will switch from Lipitor to Crestor once labs available

## 2021-06-25 NOTE — Assessment & Plan Note (Signed)
Coronary calcium score of 109 asymptomatic. Will adjust statin and advised to increase exercise and minimize carbohydrates. Referred to [facility] for exercise

## 2021-06-25 NOTE — Assessment & Plan Note (Signed)
hgba1c acceptable, minimize simple carbs. Increase exercise as tolerated. Continue current meds. Will try a switch from [facility] to [facility] 15 mg dose per week

## 2021-06-25 NOTE — Assessment & Plan Note (Signed)
Supplement and monitor 

## 2021-06-26 ENCOUNTER — Other Ambulatory Visit (HOSPITAL_COMMUNITY): Payer: Self-pay

## 2021-06-27 ENCOUNTER — Other Ambulatory Visit (HOSPITAL_COMMUNITY): Payer: Self-pay

## 2021-06-27 MED FILL — Ubrogepant Tab 100 MG: ORAL | 30 days supply | Qty: 8 | Fill #2 | Status: AC

## 2021-06-28 ENCOUNTER — Other Ambulatory Visit (HOSPITAL_COMMUNITY): Payer: Self-pay

## 2021-06-28 LAB — VITAMIN D 1,25 DIHYDROXY
Vitamin D 1, 25 (OH)2 Total: 34 pg/mL (ref 18–72)
Vitamin D2 1, 25 (OH)2: <8 pg/mL
Vitamin D3 1, 25 (OH)2: 34 pg/mL

## 2021-06-29 ENCOUNTER — Other Ambulatory Visit (HOSPITAL_COMMUNITY): Payer: Self-pay

## 2021-07-02 ENCOUNTER — Other Ambulatory Visit (HOSPITAL_COMMUNITY): Payer: Self-pay

## 2021-07-03 ENCOUNTER — Other Ambulatory Visit (HOSPITAL_COMMUNITY): Payer: Self-pay

## 2021-07-05 ENCOUNTER — Other Ambulatory Visit (HOSPITAL_COMMUNITY): Payer: Self-pay

## 2021-07-11 ENCOUNTER — Other Ambulatory Visit (HOSPITAL_COMMUNITY): Payer: Self-pay

## 2021-07-11 MED ORDER — CARESTART COVID-19 HOME TEST VI KIT
PACK | 0 refills | Status: DC
  Filled 2021-07-11: qty 4, 4d supply, fill #0

## 2021-07-17 DIAGNOSIS — Z760 Encounter for issue of repeat prescription: Secondary | ICD-10-CM | POA: Diagnosis not present

## 2021-07-20 ENCOUNTER — Institutional Professional Consult (permissible substitution): Payer: 59 | Admitting: Plastic Surgery

## 2021-08-17 ENCOUNTER — Other Ambulatory Visit (HOSPITAL_COMMUNITY): Payer: Self-pay

## 2021-09-04 ENCOUNTER — Telehealth: Payer: Self-pay | Admitting: Neurology

## 2021-09-04 NOTE — Telephone Encounter (Signed)
Patient has a Botox appointment coming up. Insurance [facility]) requires PA for Botox. I called [employer] at [phone] and spoke with Loraine Leriche to initiate case for CPT J0585/G43.709. Loraine Leriche advised me to fax records to [phone]. Pending reference #[id number].

## 2021-09-10 NOTE — Telephone Encounter (Signed)
Received approval from [facility]. PA #[id number] (09/04/21- 03/04/22).

## 2021-09-16 ENCOUNTER — Other Ambulatory Visit: Payer: Self-pay | Admitting: Family Medicine

## 2021-09-16 ENCOUNTER — Other Ambulatory Visit: Payer: Self-pay | Admitting: Pharmacist

## 2021-09-16 ENCOUNTER — Encounter: Payer: Self-pay | Admitting: Family Medicine

## 2021-09-16 DIAGNOSIS — M545 Low back pain, unspecified: Secondary | ICD-10-CM

## 2021-09-16 DIAGNOSIS — G8929 Other chronic pain: Secondary | ICD-10-CM

## 2021-09-16 MED FILL — Ubrogepant Tab 100 MG: ORAL | 30 days supply | Qty: 8 | Fill #3 | Status: AC

## 2021-09-16 MED FILL — Escitalopram Oxalate Tab 20 MG (Base Equiv): ORAL | 90 days supply | Qty: 90 | Fill #1 | Status: AC

## 2021-09-16 MED FILL — Semaglutide (Weight Mngmt) Soln Auto-Injector 2.4 MG/0.75ML: SUBCUTANEOUS | 28 days supply | Qty: 3 | Fill #2 | Status: CN

## 2021-09-17 ENCOUNTER — Other Ambulatory Visit (HOSPITAL_COMMUNITY): Payer: Self-pay

## 2021-09-17 ENCOUNTER — Other Ambulatory Visit: Payer: Self-pay

## 2021-09-17 MED ORDER — CARESTART COVID-19 HOME TEST VI KIT
PACK | 0 refills | Status: DC
  Filled 2021-09-17: qty 4, 4d supply, fill #0

## 2021-09-17 MED ORDER — CYCLOBENZAPRINE HCL 10 MG PO TABS
ORAL_TABLET | ORAL | 2 refills | Status: DC
  Filled 2021-09-17: qty 30, 10d supply, fill #0
  Filled 2021-11-03: qty 30, 10d supply, fill #1
  Filled 2022-03-10: qty 30, 10d supply, fill #2

## 2021-09-17 MED ORDER — ATORVASTATIN CALCIUM 20 MG PO TABS
20.0000 mg | ORAL_TABLET | Freq: Every day | ORAL | 1 refills | Status: DC
Start: 2021-09-17 — End: 2022-03-10
  Filled 2021-09-17: qty 90, 90d supply, fill #0
  Filled 2021-12-16: qty 90, 90d supply, fill #1

## 2021-09-18 ENCOUNTER — Other Ambulatory Visit (HOSPITAL_COMMUNITY): Payer: Self-pay

## 2021-09-24 ENCOUNTER — Other Ambulatory Visit: Payer: Self-pay | Admitting: Family Medicine

## 2021-09-24 DIAGNOSIS — Z1231 Encounter for screening mammogram for malignant neoplasm of breast: Secondary | ICD-10-CM

## 2021-10-01 ENCOUNTER — Ambulatory Visit: Payer: 59 | Admitting: Family Medicine

## 2021-10-04 ENCOUNTER — Other Ambulatory Visit (HOSPITAL_COMMUNITY): Payer: Self-pay

## 2021-10-04 ENCOUNTER — Encounter: Payer: Self-pay | Admitting: Family Medicine

## 2021-10-04 ENCOUNTER — Ambulatory Visit: Payer: 59 | Admitting: Family Medicine

## 2021-10-04 ENCOUNTER — Other Ambulatory Visit: Payer: Self-pay

## 2021-10-04 VITALS — BP 118/66 | Temp 98.3°F | Resp 16 | Wt 207.8 lb

## 2021-10-04 DIAGNOSIS — G43709 Chronic migraine without aura, not intractable, without status migrainosus: Secondary | ICD-10-CM | POA: Diagnosis not present

## 2021-10-04 DIAGNOSIS — E559 Vitamin D deficiency, unspecified: Secondary | ICD-10-CM | POA: Diagnosis not present

## 2021-10-04 DIAGNOSIS — E669 Obesity, unspecified: Secondary | ICD-10-CM | POA: Diagnosis not present

## 2021-10-04 DIAGNOSIS — E782 Mixed hyperlipidemia: Secondary | ICD-10-CM

## 2021-10-04 DIAGNOSIS — Z1159 Encounter for screening for other viral diseases: Secondary | ICD-10-CM | POA: Diagnosis not present

## 2021-10-04 DIAGNOSIS — E6609 Other obesity due to excess calories: Secondary | ICD-10-CM

## 2021-10-04 DIAGNOSIS — Z0000 Encounter for general adult medical examination without abnormal findings: Secondary | ICD-10-CM

## 2021-10-04 DIAGNOSIS — N952 Postmenopausal atrophic vaginitis: Secondary | ICD-10-CM | POA: Diagnosis not present

## 2021-10-04 DIAGNOSIS — Z6834 Body mass index (BMI) 34.0-34.9, adult: Secondary | ICD-10-CM

## 2021-10-04 DIAGNOSIS — E1169 Type 2 diabetes mellitus with other specified complication: Secondary | ICD-10-CM | POA: Diagnosis not present

## 2021-10-04 DIAGNOSIS — N951 Menopausal and female climacteric states: Secondary | ICD-10-CM | POA: Diagnosis not present

## 2021-10-04 LAB — CBC WITH DIFFERENTIAL/PLATELET
Basophils Absolute: 0.1 10*3/uL (ref 0.0–0.1)
Basophils Relative: 0.9 % (ref 0.0–3.0)
Eosinophils Absolute: 0.1 10*3/uL (ref 0.0–0.7)
Eosinophils Relative: 1.6 % (ref 0.0–5.0)
HCT: 40.4 % (ref 36.0–46.0)
Hemoglobin: 13.5 g/dL (ref 12.0–15.0)
Lymphocytes Relative: 35.9 % (ref 12.0–46.0)
Lymphs Abs: 2.7 10*3/uL (ref 0.7–4.0)
MCHC: 33.3 g/dL (ref 30.0–36.0)
MCV: 94.4 fl (ref 78.0–100.0)
Monocytes Absolute: 0.7 10*3/uL (ref 0.1–1.0)
Monocytes Relative: 8.7 % (ref 3.0–12.0)
Neutro Abs: 4 10*3/uL (ref 1.4–7.7)
Neutrophils Relative %: 52.9 % (ref 43.0–77.0)
Platelets: 311 10*3/uL (ref 150.0–400.0)
RBC: 4.28 Mil/uL (ref 3.87–5.11)
RDW: 14.5 % (ref 11.5–15.5)
WBC: 7.6 10*3/uL (ref 4.0–10.5)

## 2021-10-04 LAB — VITAMIN D 25 HYDROXY (VIT D DEFICIENCY, FRACTURES): VITD: 73.77 ng/mL (ref 30.00–100.00)

## 2021-10-04 LAB — TSH: TSH: 2.85 u[IU]/mL (ref 0.35–5.50)

## 2021-10-04 LAB — COMPREHENSIVE METABOLIC PANEL
ALT: 21 U/L (ref 0–35)
AST: 19 U/L (ref 0–37)
Albumin: 4.3 g/dL (ref 3.5–5.2)
Alkaline Phosphatase: 82 U/L (ref 39–117)
BUN: 16 mg/dL (ref 6–23)
CO2: 28 mEq/L (ref 19–32)
Calcium: 9.7 mg/dL (ref 8.4–10.5)
Chloride: 103 mEq/L (ref 96–112)
Creatinine, Ser: 0.96 mg/dL (ref 0.40–1.20)
GFR: 68.66 mL/min (ref >60.00)
Glucose, Bld: 84 mg/dL (ref 70–99)
Potassium: 4.6 mEq/L (ref 3.5–5.1)
Sodium: 137 mEq/L (ref 135–145)
Total Bilirubin: 1.7 mg/dL — ABNORMAL HIGH (ref 0.2–1.2)
Total Protein: 7.2 g/dL (ref 6.0–8.3)

## 2021-10-04 LAB — LIPID PANEL
Cholesterol: 144 mg/dL (ref 0–200)
HDL: 60 mg/dL (ref >39.00)
LDL Cholesterol: 69 mg/dL (ref 0–99)
NonHDL: 84.3
Total CHOL/HDL Ratio: 2
Triglycerides: 75 mg/dL (ref 0.0–149.0)
VLDL: 15 mg/dL (ref 0.0–40.0)

## 2021-10-04 LAB — HEMOGLOBIN A1C: Hgb A1c MFr Bld: 5.3 % (ref 4.6–6.5)

## 2021-10-04 MED ORDER — TRETINOIN 0.05 % EX CREA: TOPICAL_CREAM | Freq: Every day | CUTANEOUS | 1 refills | Status: DC

## 2021-10-04 MED ORDER — ESTROGENS CONJUGATED 0.625 MG/GM VA CREA
1.0000 | TOPICAL_CREAM | VAGINAL | 1 refills | Status: DC
  Filled 2021-10-04: qty 30, 52d supply, fill #0
  Filled 2021-11-03 – 2021-11-14 (×2): qty 30, 52d supply, fill #1
  Filled 2022-01-24: qty 30, 52d supply, fill #2

## 2021-10-04 NOTE — Assessment & Plan Note (Addendum)
Supplement and monitor 

## 2021-10-04 NOTE — Progress Notes (Signed)
Patient ID: Amanda James, female    DOB: 07/26/1971  Age: 50 y.o. MRN: [id number]    Subjective:   Chief Complaint  Patient presents with   Follow-up   Subjective   HPI Amanda James presents for office visit today for follow up on type 2 diabetes and perimenopause. She is doing well and tolerating Mounjaro 20 mL. She is currently going through perimenopause and experiencing symptoms of irritability, poor sleep, hot flashes, atrophic vaginitis, brain fog and memory issues. She states that the only symptom bothering her is atrophic vaginitis. Denies CP/palp/SOB/HA/congestion/fevers or GI c/o. Taking meds as prescribed.   Review of Systems  Constitutional:  Negative for chills, fatigue and fever.  HENT:  Negative for congestion, rhinorrhea, sinus pressure, sinus pain and sore throat.   Eyes:  Negative for pain.  Respiratory:  Negative for cough and shortness of breath.   Cardiovascular:  Negative for chest pain, palpitations and leg swelling.  Gastrointestinal:  Negative for abdominal pain, blood in stool, diarrhea, nausea and vomiting.  Genitourinary:  Negative for decreased urine volume, flank pain, frequency, vaginal bleeding and vaginal discharge.  Musculoskeletal:  Negative for back pain.  Neurological:  Negative for headaches.   History Past Medical History:  Diagnosis Date   Allergy    Cervical cancer screening 05/02/2017   Depression    Depression with anxiety    Diabetes mellitus type 2 in obese ([place])    Diabetes mellitus without complication ([place])    History of chicken pox    Hyperlipidemia    Low back pain    Migraines    Obesity 11/10/2016    She has a past surgical history that includes Bladder suspension (2016); Abdominoplasty (2012); Mastopexy (2012); Laparoscopic gastric banding (2005); and Augmentation mammaplasty.   Her family history includes Arthritis in her maternal grandmother; Diabetes in her father, maternal grandmother, mother, and sister; Heart  disease in her father and mother; Hyperlipidemia in her father, maternal grandmother, mother, paternal grandmother, and sister; Hypertension in her father, maternal grandmother, mother, and sister; Kidney disease in her paternal grandmother; Multiple myeloma in her maternal grandfather; Non-Hodgkin's lymphoma in her maternal grandmother; Obesity in her father, maternal grandmother, mother, and sister; Rosacea in her maternal grandmother.She reports that she has never smoked. She has never used smokeless tobacco. She reports current alcohol use of about 5.0 standard drinks per week. She reports that she does not use drugs.  Current Outpatient Medications on File Prior to Visit  Medication Sig Dispense Refill   aspirin EC 81 MG tablet Take 1 tablet (81 mg total) by mouth daily. Swallow whole. 90 tablet 3   atorvastatin (LIPITOR) 20 MG tablet Take 1 tablet (20 mg total) by mouth daily. 90 tablet 1   Cetirizine HCl 10 MG CAPS Take 1 capsule by mouth daily.     cyclobenzaprine (FLEXERIL) 10 MG tablet TAKE 1 TABLET BY MOUTH 3 TIMES DAILY AS NEEDED FOR MUSCLE SPASM 30 tablet 2   Erenumab-aooe (AIMOVIG) 140 MG/ML SOAJ Inject 140 mg into the skin every 30 (thirty) days. 1 pen 5   escitalopram (LEXAPRO) 10 MG tablet TAKE 1 TABLET BY MOUTH EVERY EVENING 90 tablet 1   escitalopram (LEXAPRO) 20 MG tablet TAKE 1 TABLET BY MOUTH EVERY MORNING 90 tablet 1   tirzepatide (MOUNJARO) 15 MG/0.5ML Pen Inject 15 mg into the skin once a week. 6 mL 5   Ubrogepant 100 MG TABS TAKE 1 TABLET BY MOUTH EVERY 2 HOURS AS NEEDED. MAXIMUM OF 2 TABLETS  PER DAY. 8 tablet 11   Current Facility-Administered Medications on File Prior to Visit  Medication Dose Route Frequency Provider Last Rate Last Admin   gadobenate dimeglumine (MULTIHANCE) injection 20 mL  20 mL Intravenous Once PRN Melvenia Beam, MD       sodium chloride flush (NS) 0.9 % injection 10 mL  10 mL Intravenous PRN Melvenia Beam, MD   20 mL at 10/26/20 1345      Objective:  Objective  Physical Exam Constitutional:      General: She is not in acute distress.    Appearance: Normal appearance. She is not ill-appearing or toxic-appearing.  HENT:     Head: Normocephalic and atraumatic.     Right Ear: Tympanic membrane, ear canal and external ear normal.     Left Ear: Tympanic membrane, ear canal and external ear normal.     Nose: No congestion or rhinorrhea.  Eyes:     Extraocular Movements: Extraocular movements intact.     Pupils: Pupils are equal, round, and reactive to light.  Cardiovascular:     Rate and Rhythm: Normal rate and regular rhythm.     Pulses: Normal pulses.     Heart sounds: Normal heart sounds. No murmur heard. Pulmonary:     Effort: Pulmonary effort is normal. No respiratory distress.     Breath sounds: Normal breath sounds. No wheezing, rhonchi or rales.  Abdominal:     General: Bowel sounds are normal.     Palpations: Abdomen is soft. There is no mass.     Tenderness: There is no abdominal tenderness. There is no guarding.     Hernia: No hernia is present.  Musculoskeletal:        General: Normal range of motion.     Cervical back: Normal range of motion and neck supple.  Skin:    General: Skin is warm and dry.  Neurological:     Mental Status: She is alert and oriented to person, place, and time.  Psychiatric:        Behavior: Behavior normal.   BP 118/66   Temp 98.3 F (36.8 C)   Resp 16   Wt 207 lb 12.8 oz (94.3 kg)   BMI 33.54 kg/m  Wt Readings from Last 3 Encounters:  10/04/21 207 lb 12.8 oz (94.3 kg)  06/25/21 217 lb 12.8 oz (98.8 kg)  02/16/21 218 lb 3.2 oz (99 kg)     Lab Results  Component Value Date   WBC 7.6 10/04/2021   HGB 13.5 10/04/2021   HCT 40.4 10/04/2021   PLT 311.0 10/04/2021   GLUCOSE 84 10/04/2021   CHOL 144 10/04/2021   TRIG 75.0 10/04/2021   HDL 60.00 10/04/2021   LDLCALC 69 10/04/2021   ALT 21 10/04/2021   AST 19 10/04/2021   NA 137 10/04/2021   K 4.6 10/04/2021   CL  103 10/04/2021   CREATININE 0.96 10/04/2021   BUN 16 10/04/2021   CO2 28 10/04/2021   TSH 2.85 10/04/2021   INR 1.04 09/17/2016   HGBA1C 5.3 10/04/2021   MICROALBUR <0.7 09/10/2019    DG INJECT DIAG/THERA/INC NEEDLE/CATH/PLC EPI/LUMB/SAC W/IMG  Result Date: 05/18/2021 CLINICAL DATA:  Lumbosacral spondylosis without myelopathy. Significant relief after the previous injection, without side effect or complication. Partial recurrence of symptoms. The patient wishes to repeat. EXAM: LUMBAR EPIDURAL INJECTION: DIAGNOSTIC EPIDURAL INJECTION: THERAPEUTIC EPIDURAL INJECTION: PROCEDURE: The procedure, risks, benefits, and alternatives were explained to the patient. Questions regarding the procedure were encouraged and  answered. The patient understands and consents to the procedure. An interlaminar approach was performed on right at L4-5. The overlying skin was cleansed and anesthetized. A 20 gauge epidural needle was advanced using loss-of-resistance technique. Injection of Isovue-M 200 shows a good epidural pattern with spread above and below the level of needle placement, primarily on the right, and extending across midline. No vascular opacification is seen. 1m of Depo-Medrol mixed with 272mlidocaine 1% were instilled. The procedure was well-tolerated, and the patient was discharged thirty minutes following the injection in good condition. FLUOROSCOPY TIME:  31 seconds; 50 uGym2 DAP COMPLICATIONS: None immediate IMPRESSION: Technically successful epidural injection on the right at L4-5. Electronically Signed   By: D Lucrezia Europe.D.   On: 05/18/2021 08:12     Assessment & Plan:  Plan    Meds ordered this encounter  Medications   conjugated estrogens (PREMARIN) vaginal cream    Sig: Place 1 applicatorful vaginally 2 (two) times a week.    Dispense:  42.5 g    Refill:  1   tretinoin (RETIN-A) 0.05 % cream    Sig: Apply topically at bedtime.    Dispense:  135 g    Refill:  1    Problem List Items  Addressed This Visit     Diabetes mellitus type 2 in obese (HCC)    hgba1c acceptable, minimize simple carbs. Increase exercise as tolerated. Continue current meds      Relevant Orders   Hemoglobin A1c (Completed)   Insulin, random   Hyperlipidemia - Primary    Encourage heart healthy diet such as MIND or DASH diet, increase exercise, avoid trans fats, simple carbohydrates and processed foods, consider a krill or fish or flaxseed oil cap daily. Tolerating Atorvastatin      Relevant Orders   CBC with Differential/Platelet (Completed)   Comprehensive metabolic panel (Completed)   Lipid panel (Completed)   TSH (Completed)   Vitamin D deficiency    Supplement and monitor      Relevant Orders   VITAMIN D 25 Hydroxy (Vit-D Deficiency, Fractures) (Completed)   Chronic migraine w/o aura w/o status migrainosus, not intractable    Maintain 64 ounces of clear fluids daily. Minimize alcohol and caffeine. Get adequate sleep, 7-8 hours a night. Needs exercise daily preferably in the morning. Doing well on current meds      Class 1 obesity due to excess calories with serious comorbidity and body mass index (BMI) of 34.0 to 34.9 in adult    Good weight loss since last visit, 10#, no changes      Perimenopause    No cycle in 6 months. Encouraged small frequent meals with protein. Increase exercise, get adequate sleep      Other Visit Diagnoses     Atrophic vaginitis       Relevant Medications   conjugated estrogens (PREMARIN) vaginal cream   Encounter for screening and preventative care       Relevant Orders   HIV antibody (with reflex)   Encounter for hepatitis C screening test for low risk patient       Relevant Orders   Hepatitis C Antibody       Follow-up: No follow-ups on file.  I, DaSuezanne Jacquetacting as a scribe for StPenni HomansMD, have documented all relevent documentation on behalf of StPenni HomansMD, as directed by StPenni HomansMD while in the presence of StPenni HomansMD. DO:10/05/21.  I, StMosie LukesMD personally performed the services described  in this documentation. All medical record entries made by the scribe were at my direction and in my presence. I have reviewed the chart and agree that the record reflects my personal performance and is accurate and complete

## 2021-10-04 NOTE — Patient Instructions (Addendum)
[employer] for products Hyaluronic acid Send [place] name of eye doctor   Atrophic Vaginitis Atrophic vaginitis is a condition in which the tissues that line the vagina become dry and thin. This condition is most common in women who have stopped having regular menstrual periods (are in menopause). This usually starts when a woman is 37 to 51 years old. That is the time when a woman's estrogen levels begin to decrease. Estrogen is a female hormone. It helps to keep the tissues of the vagina moist. It stimulates the vagina to produce a clear fluid that lubricates the vagina for sex. This fluid also protects the vagina from infection. Lack of estrogen can cause the lining of the vagina to get thinner and dryer. The vagina may also shrink in size. It may become less elastic. Atrophic vaginitis tends to get worse over time as a woman's estrogen level drops. What are the causes? This condition is caused by the normal drop in estrogen that happens around the time of menopause. What increases the risk? Certain conditions or situations may lower a woman's estrogen level, leading to a higher risk for atrophic vaginitis. You are more likely to develop this condition if: You are taking medicines that block estrogen. You have had your ovaries removed. You are being treated for cancer with radiation or medicines (chemotherapy). You have given birth or are breastfeeding. You are older than age 29. You smoke. What are the signs or symptoms? Symptoms of this condition include: Pain, soreness, a feeling of pressure, or bleeding during sex (dyspareunia). Vaginal burning, irritation, or itching. Pain or bleeding when a speculum is used in a vaginal exam. Having burning pain while urinating. Vaginal discharge. In some cases, there are no symptoms. How is this diagnosed? This condition is diagnosed based on your medical history and a physical exam. This will include a pelvic exam that checks the vaginal tissues.  Though rare, you may also have other tests, including: A urine test. A test that checks the acid balance in your vagina (acid balance test). How is this treated? Treatment for this condition depends on how severe your symptoms are. Treatment may include: Using an over-the-counter vaginal lubricant before sex. Using a long-acting vaginal moisturizer. Using low-dose estrogen for moderate to severe symptoms that do not respond to other treatments. Options include creams, tablets, and inserts (vaginal rings). Before you use a vaginal estrogen, tell your health care provider if you have a history of: Breast cancer. Endometrial cancer. Blood clots. If you are not sexually active and your symptoms are very mild, you may not need treatment. Follow these instructions at home: Medicines Take over-the-counter and prescription medicines only as told by your health care provider. Do not use herbal or alternative medicines unless your health care provider says that you can. Use over-the-counter creams, lubricants, or moisturizers for dryness only as told by your health care provider. General instructions If your atrophic vaginitis is caused by menopause, discuss all of your menopause symptoms and treatment options with your health care provider. Do not douche. Do not use products that can make your vagina dry. These include: Scented feminine sprays. Scented tampons. Scented soaps. Vaginal sex can help to improve blood flow and elasticity of vaginal tissue. If you choose to have sex and it hurts, try using a water-soluble lubricant or moisturizer right before having sex. Contact a health care provider if: Your discharge looks different than normal. Your vagina has an unusual smell. You have new symptoms. Your symptoms do not improve  with treatment. Your symptoms get worse. Summary Atrophic vaginitis is a condition in which the tissues that line the vagina become dry and thin. It is most common in  women who have stopped having regular menstrual periods (are in menopause). Treatment options include using vaginal lubricants and low-dose vaginal estrogen. Contact a health care provider if your vagina has an unusual smell, or if your symptoms get worse or do not improve after treatment. This information is not intended to replace advice given to you by your health care provider. Make sure you discuss any questions you have with your health care provider. Document Revised: 05/25/2020 Document Reviewed: 05/25/2020 Elsevier Patient Education  2022 [employer].

## 2021-10-05 LAB — INSULIN, RANDOM: Insulin: 16.4 u[IU]/mL

## 2021-10-05 LAB — HEPATITIS C ANTIBODY
Hepatitis C Ab: NONREACTIVE
SIGNAL TO CUT-OFF: 0.03 (ref <1.00)

## 2021-10-05 LAB — HIV ANTIBODY (ROUTINE TESTING W REFLEX): HIV 1&2 Ab, 4th Generation: NONREACTIVE

## 2021-10-05 NOTE — Assessment & Plan Note (Signed)
hgba1c acceptable, minimize simple carbs. Increase exercise as tolerated. Continue current meds 

## 2021-10-05 NOTE — Assessment & Plan Note (Signed)
No cycle in 6 months. Encouraged small frequent meals with protein. Increase exercise, get adequate sleep

## 2021-10-05 NOTE — Assessment & Plan Note (Signed)
Good weight loss since last visit, 10#, no changes

## 2021-10-05 NOTE — Assessment & Plan Note (Signed)
Maintain 64 ounces of clear fluids daily. Minimize alcohol and caffeine. Get adequate sleep, 7-8 hours a night. Needs exercise daily preferably in the morning. Doing well on current meds

## 2021-10-05 NOTE — Assessment & Plan Note (Addendum)
Encourage heart healthy diet such as MIND or DASH diet, increase exercise, avoid trans fats, simple carbohydrates and processed foods, consider a krill or fish or flaxseed oil cap daily. Tolerating Atorvastatin 

## 2021-10-19 ENCOUNTER — Other Ambulatory Visit: Payer: Self-pay | Admitting: Family Medicine

## 2021-10-19 DIAGNOSIS — Z1231 Encounter for screening mammogram for malignant neoplasm of breast: Secondary | ICD-10-CM

## 2021-11-03 ENCOUNTER — Other Ambulatory Visit (HOSPITAL_COMMUNITY): Payer: Self-pay

## 2021-11-08 NOTE — Progress Notes (Signed)
I, Philbert Riser, LAT, ATC acting as a scribe for Clementeen Graham, MD.  Amanda James is a 50 y.o. female who presents to [facility] today for f/u of chronic low back pain.  She was last seen by Dr. Denyse Amass on 02/16/21 and a repeat R L4-5 epidural was ordered that she had on 02/23/21.  She then had another R L4-5 ESI on 05/18/21.  Today, pt reports LBP has been flaring up more lately. No numbness/tingling. Pt locates pain to the midline of the low back, R>L. Epidural steroid injections have been lasting at least 3 months for the chronic back pain.  Diagnostic imaging: L-spine MRI- 07/30/20; L-spine XR- 07/21/20  Pertinent review of systems: No fevers or chills  Relevant historical information: Diabetes.  CAD.   Exam:  BP 118/78   Pulse 74   Ht 5\' 6"  (1.676 m)   Wt 206 lb 12.8 oz (93.8 kg)   SpO2 99%   BMI 33.38 kg/m  General: Well Developed, well nourished, and in no acute distress.   MSK: L-spine not particular tender midline.  Normal gait.    Lab and Radiology Results EXAM: MRI LUMBAR SPINE WITHOUT CONTRAST   TECHNIQUE: Multiplanar, multisequence MR imaging of the lumbar spine was performed. No intravenous contrast was administered.   COMPARISON:  Radiography 07/21/2020   FINDINGS: Segmentation:  5 lumbar type vertebral bodies.   Alignment:  Normal   Vertebrae:  Normal   Conus medullaris and cauda equina: Conus extends to the L1 level. Conus and cauda equina appear normal.   Paraspinal and other soft tissues: Normal   Disc levels:   No abnormality at L2-3 or above.   L3-4: Desiccation and mild bulging of the disc. Mild facet osteoarthritis. No compressive stenosis.   L4-5: Mild desiccation and bulging of the disc. Mild facet osteoarthritis. No compressive stenosis.   L5-S1: Minimal bulging of the disc. Probable bilateral pars defects and some facet osteoarthritis. No compressive stenosis.   IMPRESSION: L3-4 and L4-5: Mild  bulging of the disc. Mild facet osteoarthritis. No compressive stenosis. Findings could contribute to low back pain.   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 Minimal disc bulge. No stenosis. Findings could contribute to low back pain.     Electronically Signed   By: 07/23/2020 M.D.   On: 07/30/2020 13:56   I, 08/01/2020, personally (independently) visualized and performed the interpretation of the images attached in this note.     Assessment and Plan: 51 y.o. 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  Patient has been doing pretty well over the last 2 years or so with core strengthening home exercise program after learning it and physical therapy and intermittent epidural steroid injections.  However she does not have radicular pain.  Her main issue is axial back pain.  On review of MRI she has facet DJD L3-4 and L4-5.  After discussion plan for trial of bilateral facet injections L4-5.  If this does not help we will try L3-4 to try to identify the pain generator.  May proceed to medial branch block and ablation.  Always can go to backup plan of epidural steroid injections because they have been working released reasonably well.  Discussion with Icey regarding plan and options and next steps.   PDMP not reviewed this encounter. Orders Placed This Encounter  Procedures   DG FACET JT INJ L /S SINGLE LEVEL LEFT W/FL/CT  Standing Status:   Future    Standing Expiration Date:   11/09/2022    Order Specific Question:   Reason for Exam (SYMPTOM  OR DIAGNOSIS REQUIRED)    Answer:   BL L4-L%    Order Specific Question:   Is the patient pregnant?    Answer:   No    Order Specific Question:   Preferred Imaging Location?    Answer:   [facility]    Order Specific Question:   Radiology Contrast Protocol - do NOT remove file path    Answer:   \\[email or web address]   DG FACET JT INJ L /S  SINGLE LEVEL RIGHT W/FL/CT    Standing Status:   Future    Standing Expiration Date:   11/09/2022    Order Specific Question:   Reason for Exam (SYMPTOM  OR DIAGNOSIS REQUIRED)    Answer:   BL L4-L5    Order Specific Question:   Is the patient pregnant?    Answer:   No    Order Specific Question:   Preferred Imaging Location?    Answer:   [facility]    Order Specific Question:   Radiology Contrast Protocol - do NOT remove file path    Answer:   \\[email or web address]s.[email or web address]   No orders of the defined types were placed in this encounter.    Discussed warning signs or symptoms. Please see discharge instructions. Patient expresses understanding.   The above documentation has been reviewed and is accurate and complete Clementeen Graham, M.D.  Total encounter time 20 minutes including face-to-face time with the patient and, reviewing past medical record, and charting on the date of service.   Treatment plan and options

## 2021-11-09 ENCOUNTER — Other Ambulatory Visit: Payer: Self-pay

## 2021-11-09 ENCOUNTER — Ambulatory Visit (INDEPENDENT_AMBULATORY_CARE_PROVIDER_SITE_OTHER): Payer: 59 | Admitting: Family Medicine

## 2021-11-09 VITALS — BP 118/78 | HR 74 | Ht 66.0 in | Wt 206.8 lb

## 2021-11-09 DIAGNOSIS — M545 Low back pain, unspecified: Secondary | ICD-10-CM

## 2021-11-09 DIAGNOSIS — G8929 Other chronic pain: Secondary | ICD-10-CM

## 2021-11-09 DIAGNOSIS — M4306 Spondylolysis, lumbar region: Secondary | ICD-10-CM

## 2021-11-09 NOTE — Patient Instructions (Addendum)
Thank you for coming in today.   Plan for Facet injection at L4-L5 BL.  If not better plan for L3-L4  Please call [facility] at [phone] to schedule your spine injection.

## 2021-11-13 ENCOUNTER — Encounter: Payer: Self-pay | Admitting: Family Medicine

## 2021-11-14 ENCOUNTER — Other Ambulatory Visit (HOSPITAL_COMMUNITY): Payer: Self-pay

## 2021-11-14 MED ORDER — FREESTYLE LITE W/DEVICE KIT
PACK | 0 refills | Status: AC
  Filled 2021-11-14: qty 1, 30d supply, fill #0

## 2021-11-14 MED ORDER — GLUCOSE BLOOD VI STRP
ORAL_STRIP | 0 refills | Status: DC
  Filled 2021-11-14: qty 100, 25d supply, fill #0

## 2021-11-14 MED ORDER — FREESTYLE LANCETS MISC
0 refills | Status: DC
  Filled 2021-11-14: qty 100, 25d supply, fill #0

## 2021-11-14 MED ORDER — BLOOD GLUCOSE METER KIT: PACK | 0 refills | Status: DC

## 2021-11-22 ENCOUNTER — Ambulatory Visit: Payer: 59 | Admitting: Family Medicine

## 2021-11-23 ENCOUNTER — Other Ambulatory Visit: Payer: Self-pay | Admitting: Family Medicine

## 2021-11-23 ENCOUNTER — Ambulatory Visit
Admission: RE | Admit: 2021-11-23 | Discharge: 2021-11-23 | Disposition: A | Discharge status: Home / Self Care | Payer: 59 | Source: Ambulatory Visit

## 2021-11-23 DIAGNOSIS — Z1231 Encounter for screening mammogram for malignant neoplasm of breast: Secondary | ICD-10-CM

## 2021-12-04 ENCOUNTER — Ambulatory Visit
Admission: RE | Admit: 2021-12-04 | Discharge: 2021-12-04 | Disposition: A | Discharge status: Home / Self Care | Payer: 59 | Source: Ambulatory Visit | Attending: Family Medicine | Admitting: Family Medicine

## 2021-12-04 ENCOUNTER — Other Ambulatory Visit: Payer: Self-pay

## 2021-12-04 DIAGNOSIS — M545 Low back pain, unspecified: Secondary | ICD-10-CM

## 2021-12-04 DIAGNOSIS — M4306 Spondylolysis, lumbar region: Secondary | ICD-10-CM

## 2021-12-04 DIAGNOSIS — M4696 Unspecified inflammatory spondylopathy, lumbar region: Secondary | ICD-10-CM | POA: Diagnosis not present

## 2021-12-04 DIAGNOSIS — G8929 Other chronic pain: Secondary | ICD-10-CM

## 2021-12-04 MED ORDER — IOPAMIDOL (ISOVUE-M 200) INJECTION 41%
1.0000 mL | Freq: Once | INTRAMUSCULAR | Status: AC
  Administered 2021-12-04: 09:00:00 1 mL via INTRA_ARTICULAR

## 2021-12-04 MED ORDER — METHYLPREDNISOLONE ACETATE 40 MG/ML INJ SUSP (RADIOLOG
80.0000 mg | Freq: Once | INTRAMUSCULAR | Status: AC
  Administered 2021-12-04: 09:00:00 80 mg via INTRA_ARTICULAR

## 2021-12-04 NOTE — Discharge Instructions (Signed)

## 2021-12-16 ENCOUNTER — Other Ambulatory Visit: Payer: Self-pay | Admitting: Family Medicine

## 2021-12-16 ENCOUNTER — Encounter: Payer: Self-pay | Admitting: Neurology

## 2021-12-17 ENCOUNTER — Other Ambulatory Visit (HOSPITAL_COMMUNITY): Payer: Self-pay

## 2021-12-17 MED ORDER — ESCITALOPRAM OXALATE 10 MG PO TABS
ORAL_TABLET | Freq: Every evening | ORAL | 1 refills | Status: DC
  Filled 2021-12-17: qty 90, 90d supply, fill #0
  Filled 2022-03-10: qty 90, 90d supply, fill #1

## 2021-12-17 MED ORDER — ESCITALOPRAM OXALATE 20 MG PO TABS
ORAL_TABLET | Freq: Every morning | ORAL | 1 refills | Status: DC
  Filled 2021-12-17: qty 90, 90d supply, fill #0
  Filled 2022-03-10: qty 90, 90d supply, fill #1

## 2021-12-17 MED ORDER — UBRELVY 100 MG PO TABS
100.0000 mg | ORAL_TABLET | ORAL | 6 refills | Status: DC | PRN
  Filled 2021-12-17: qty 8, 16d supply, fill #0
  Filled 2022-01-11: qty 8, 16d supply, fill #1
  Filled 2022-02-12: qty 8, 16d supply, fill #2
  Filled 2022-03-10: qty 8, 16d supply, fill #3
  Filled 2022-04-14: qty 8, 16d supply, fill #4
  Filled 2022-05-15: qty 8, 16d supply, fill #5
  Filled 2022-06-06: qty 8, 16d supply, fill #6

## 2021-12-18 ENCOUNTER — Other Ambulatory Visit (HOSPITAL_COMMUNITY): Payer: Self-pay

## 2021-12-19 NOTE — Progress Notes (Signed)
[facility]    Provider:  Dr Jaynee Eagles Referring Provider: Mosie Lukes, MD Primary Care Physician:  Mosie Lukes, MD  CC:  Migraine  HPI:  Amanda James is a 51 y.o. female here as a referral from Dr. Charlett Blake for migraines. She has a past medical history of type 2 diabetes, hyperlipidemia, obesity, allergies, depression, chronic low back pain, and vitamin D deficiency. Migraines started 20 years ago. Triggers include pulling her hair back and then it is a pounding pain. Her headaches can be 2-3x a day and can be daily or last days.She can go a week or longer without a headache but always with a least half the month with headaches. No medication overuse. Triggers for her headaches can be putting her hair back. She used ot get a headache with orgasm in the past which resolved. She has TMJ. She can have morning headaches with exacerbations. Red wine can trigger. Caffeine and food does not trigger. Her headaches tend to be on the top pf the head worse on the left. Throbbing and pounding. Mild nausea. No vomiting. No aura. She has light sensitivity, sound and smell sensitivity. She has a lot of musculoskeletal neck pain worse on the left. She goes to [facility] and massages help. Within 5 minutes the migraine can be at full peak. They can be moderately severe at least on average she can work throughout the day. They can wax and wane all day long, can last 2-3 days. She has more than 15 headaches in a month and 11-12 are moderate to severe migrainous lasting 24-72 hours or longer. Her migraines have been at at this frequency for over a year. Wearing contacts can cause pain. She snores. Morning headaches. Unknown history. She has blurry vision with the headaches. She has had swishing and heartbeat in the ear when laying down. No other focal neurologic deficits, associated symptoms, inciting events or modifiable factors.  Failed Lexpro and Topiramate. Has been on Lexapro for many years,  had side effects with Topiramate  Medications: NSAIDs help better than tylenol, flexeril (doesn't help), Topiramate, Lexapro. Never tried a triptan.   Review of Systems: Patient complains of symptoms per HPI as well as the following symptoms: no CP, no SOB. Pertinent negatives per HPI. All others negative.   Social History   Socioeconomic History   Marital status: Married    Spouse name: Not on file   Number of children: 1   Years of education: MD   Highest education level: Not on file  Occupational History   Occupation: [employer]  Tobacco Use   Smoking status: Never   Smokeless tobacco: Never  Substance and Sexual Activity   Alcohol use: Yes    Alcohol/week: 5.0 standard drinks    Types: 5 Glasses of wine per week    Comment: 3-5 glasses of wine per week   Drug use: No   Sexual activity: Not on file  Other Topics Concern   Not on file  Social History Narrative   Works With [employer], lives with husband, 3 cats   No major dietary restrictions. Exercise 30 minutes 3 x a week   Seat belts routinely   Right-handed   Caffeine: 1-2 cups of coffee per day   Social Determinants of Health   Financial Resource Strain: Not on file  Food Insecurity: Not on file  Transportation Needs: Not on file  Physical Activity: Not on file  Stress: Not on file  Social Connections: Not on file  Intimate Partner Violence: Not on file    Family History  Problem Relation Age of Onset   Diabetes Mother    Hypertension Mother    Hyperlipidemia Mother    Obesity Mother    Heart disease Mother        pacer   Diabetes Father    Heart disease Father        MI   Hyperlipidemia Father    Hypertension Father    Obesity Father    Diabetes Sister    Hyperlipidemia Sister    Hypertension Sister    Obesity Sister    Obesity Maternal Grandmother    Hypertension Maternal Grandmother    Hyperlipidemia Maternal Grandmother    Diabetes Maternal Grandmother    Rosacea Maternal Grandmother     Non-Hodgkin's lymphoma Maternal Grandmother    Arthritis Maternal Grandmother        spinal stenosis   Multiple myeloma Maternal Grandfather    Hyperlipidemia Paternal Grandmother        rhabdo from statins   Kidney disease Paternal Grandmother    Migraines Neg Hx     Past Medical History:  Diagnosis Date   Allergy    Cervical cancer screening 05/02/2017   Depression    Depression with anxiety    Diabetes mellitus type 2 in obese ([place])    Diabetes mellitus without complication ([place])    History of chicken pox    Hyperlipidemia    Low back pain    Migraines    Obesity 11/10/2016    Past Surgical History:  Procedure Laterality Date   ABDOMINOPLASTY  2012   AUGMENTATION MAMMAPLASTY     maxilift with implants    BLADDER SUSPENSION  2016   LAPAROSCOPIC GASTRIC BANDING  2005   esophageal dilation. band still present but released   MASTOPEXY  2012    Current Outpatient Medications  Medication Sig Dispense Refill   Cetirizine HCl 10 MG CAPS Take 1 capsule by mouth daily.     aspirin EC 81 MG tablet Take 1 tablet (81 mg total) by mouth daily. Swallow whole. 90 tablet 3   atorvastatin (LIPITOR) 20 MG tablet Take 1 tablet (20 mg total) by mouth daily. 90 tablet 1   blood glucose meter kit and supplies Dispense based on patient and insurance preference. Use up to four times daily as directed. Dx: E11.9 1 each 0   Blood Glucose Monitoring Suppl (FREESTYLE LITE) w/Device KIT Use to test blood sugar 4 times a day 1 kit 0   conjugated estrogens (PREMARIN) vaginal cream Place 1 applicatorful vaginally 2 (two) times a week. 42.5 g 1   cyclobenzaprine (FLEXERIL) 10 MG tablet TAKE 1 TABLET BY MOUTH 3 TIMES DAILY AS NEEDED FOR MUSCLE SPASM 30 tablet 2   Erenumab-aooe (AIMOVIG) 140 MG/ML SOAJ Inject 140 mg into the skin every 30 (thirty) days. 1 pen 5   escitalopram (LEXAPRO) 10 MG tablet TAKE 1 TABLET BY MOUTH EVERY EVENING 90 tablet 1   escitalopram (LEXAPRO) 20 MG tablet TAKE 1 TABLET BY  MOUTH EVERY MORNING 90 tablet 1   glucose blood test strip Use to test blood sugar 4 times a day 100 each 0   Lancets (FREESTYLE) lancets Use to test blood sugar 4 times a day 100 each 0   tirzepatide (MOUNJARO) 15 MG/0.5ML Pen Inject 15 mg into the skin once a week. 6 mL 5   tretinoin (RETIN-A) 0.05 % cream Apply topically at bedtime. 135 g 1   Ubrogepant (  UBRELVY) 100 MG TABS Take 1 tablet by mouth every 2 hours as needed as directed. Maximum 2 tablets per day. 8 tablet 6   No current facility-administered medications for this visit.   Facility-Administered Medications Ordered in Other Visits  Medication Dose Route Frequency Provider Last Rate Last Admin   gadobenate dimeglumine (MULTIHANCE) injection 20 mL  20 mL Intravenous Once PRN Melvenia Beam, MD       sodium chloride flush (NS) 0.9 % injection 10 mL  10 mL Intravenous PRN Melvenia Beam, MD   20 mL at 10/26/20 1345    Allergies as of 04/04/2017 - Review Complete 04/04/2017  Allergen Reaction Noted   Erythromycin Diarrhea 09/17/2016    Vitals: BP 120/81    Pulse 75    Ht '5\' 6"'  (1.676 m)    Wt 210 lb 9.6 oz (95.5 kg)    BMI 33.99 kg/m  Last Weight:  Wt Readings from Last 1 Encounters:  11/09/21 206 lb 12.8 oz (93.8 kg)   Last Height:   Ht Readings from Last 1 Encounters:  11/09/21 '5\' 6"'  (1.676 m)    Physical exam: Exam: Gen: NAD, conversant, well nourised, obese, well groomed                     CV: RRR, no MRG. No Carotid Bruits. No peripheral edema, warm, nontender Eyes: Conjunctivae clear without exudates or hemorrhage  Neuro: Detailed Neurologic Exam  Speech:    Speech is normal; fluent and spontaneous with normal comprehension.  Cognition:    The patient is oriented to person, place, and time;     recent and remote memory intact;     language fluent;     normal attention, concentration,     fund of knowledge Cranial Nerves:    The pupils are equal, round, and reactive to light. The fundi are normal  and spontaneous venous pulsations are present. Visual fields are full to finger confrontation. Extraocular movements are intact. Trigeminal sensation is intact and the muscles of mastication are normal. The face is symmetric. The palate elevates in the midline. Hearing intact. Voice is normal. Shoulder shrug is normal. The tongue has normal motion without fasciculations.   Coordination:    Normal finger to nose and heel to shin. Normal rapid alternating movements.   Gait:    Heel-toe and tandem gait are normal.   Motor Observation:    No asymmetry, no atrophy, and no involuntary movements noted. Tone:    Normal muscle tone.    Posture:    Posture is normal. normal erect    Strength:    Strength is V/V in the upper and lower limbs.      Sensation: intact to LT     Reflex Exam:  DTR's:    Deep tendon reflexes in the upper and lower extremities are normal bilaterally.   Toes:    The toes are downgoing bilaterally.   Clonus:    Clonus is absent.      Assessment/Plan:  [age] year old female with chronic migraines, without aura  Discussed acute and preventative migraine management Recommend Botox therapy, she has failed multiple classes of medications Imitrex at onset of headache Recommend possibly retrying Topiramate but patient had side effects, will consider trying ER formulary  Discussed the following: To prevent or relieve headaches, try the following: Cool Compress. Lie down and place a cool compress on your head.  Avoid headache triggers. If certain foods or odors seem to  have triggered your migraines in the past, avoid them. A headache diary might help you identify triggers.  Include physical activity in your daily routine. Try a daily walk or other moderate aerobic exercise.  Manage stress. Find healthy ways to cope with the stressors, such as delegating tasks on your to-do list.  Practice relaxation techniques. Try deep breathing, yoga, massage and visualization.   Eat regularly. Eating regularly scheduled meals and maintaining a healthy diet might help prevent headaches. Also, drink plenty of fluids.  Follow a regular sleep schedule. Sleep deprivation might contribute to headaches Consider biofeedback. With this mind-body technique, you learn to control certain bodily functions -- such as muscle tension, heart rate and blood pressure -- to prevent headaches or reduce headache pain.    Proceed to emergency room if you experience new or worsening symptoms or symptoms do not resolve, if you have new neurologic symptoms or if headache is severe, or for any concerning symptom.    Sarina Ill, MD  [facility] [address] [place], [place] [address]  Phone [phone] Fax [phone]  A total of 60 minutes was spent face-to-face with this patient. Over half this time was spent on counseling patient on the chronic migraine diagnosis and different diagnostic and therapeutic options available.

## 2021-12-24 ENCOUNTER — Ambulatory Visit: Payer: 59 | Admitting: Neurology

## 2021-12-28 ENCOUNTER — Ambulatory Visit (INDEPENDENT_AMBULATORY_CARE_PROVIDER_SITE_OTHER): Payer: 59 | Admitting: Neurology

## 2021-12-28 DIAGNOSIS — G43709 Chronic migraine without aura, not intractable, without status migrainosus: Secondary | ICD-10-CM | POA: Diagnosis not present

## 2021-12-28 NOTE — Progress Notes (Signed)
Pt signed Botox consent Botox- 200 units x 1 vial Lot: [id number] Expiration: 08/2024 NDC: 2951-8841-66  Bacteriostatic 0.9% Sodium Chloride- 75mL total Lot: [id number] Expiration: 07/10/2023 NDC: 0109-3235-57  Dx: D22.025 B/B

## 2021-12-30 NOTE — Progress Notes (Signed)
Consent Form Botulism Toxin Injection For Chronic Migraine    12/28/2020: Fantastic response> 70% improvement migraine and headache freq and severity.    Reviewed orally with patient, additionally signature is on file:  Botulism toxin has been approved by the Federal drug administration for treatment of chronic migraine. Botulism toxin does not cure chronic migraine and it may not be effective in some patients.  The administration of botulism toxin is accomplished by injecting a small amount of toxin into the muscles of the neck and head. Dosage must be titrated for each individual. Any benefits resulting from botulism toxin tend to wear off after 3 months with a repeat injection required if benefit is to be maintained. Injections are usually done every 3-4 months with maximum effect peak achieved by about 2 or 3 weeks. Botulism toxin is expensive and you should be sure of what costs you will incur resulting from the injection.  The side effects of botulism toxin use for chronic migraine may include:   -Transient, and usually mild, facial weakness with facial injections  -Transient, and usually mild, head or neck weakness with head/neck injections  -Reduction or loss of forehead facial animation due to forehead muscle weakness  -Eyelid drooping  -Dry eye  -Pain at the site of injection or bruising at the site of injection  -Double vision  -Potential unknown long term risks  Contraindications: You should not have Botox if you are pregnant, nursing, allergic to albumin, have an infection, skin condition, or muscle weakness at the site of the injection, or have myasthenia gravis, Lambert-Eaton syndrome, or ALS.  It is also possible that as with any injection, there may be an allergic reaction or no effect from the medication. Reduced effectiveness after repeated injections is sometimes seen and rarely infection at the injection site may occur. All care will be taken to prevent these side  effects. If therapy is given over a long time, atrophy and wasting in the muscle injected may occur. Occasionally the patient's become refractory to treatment because they develop antibodies to the toxin. In this event, therapy needs to be modified.  I have read the above information and consent to the administration of botulism toxin.    BOTOX PROCEDURE NOTE FOR MIGRAINE HEADACHE    Contraindications and precautions discussed with patient(above). Aseptic procedure was observed and patient tolerated procedure. Procedure performed by Dr. Artemio Aly  The condition has existed for more than 6 months, and pt does not have a diagnosis of ALS, Myasthenia Gravis or Lambert-Eaton Syndrome.  Risks and benefits of injections discussed and pt agrees to proceed with the procedure.  Written consent obtained  These injections are medically necessary. Pt  receives good benefits from these injections. These injections do not cause sedations or hallucinations which the oral therapies may cause.  Description of procedure:  The patient was placed in a sitting position. The standard protocol was used for Botox as follows, with 5 units of Botox injected at each site:   -Procerus muscle, midline injection  -Corrugator muscle, bilateral injection  -Frontalis muscle, bilateral injection, with 2 sites each side, medial injection was performed in the upper one third of the frontalis muscle, in the region vertical from the medial inferior edge of the superior orbital rim. The lateral injection was again in the upper one third of the forehead vertically above the lateral limbus of the cornea, 1.5 cm lateral to the medial injection site.  -Temporalis muscle injection, 4 sites, bilaterally. The first injection was 3 cm  above the tragus of the ear, second injection site was 1.5 cm to 3 cm up from the first injection site in line with the tragus of the ear. The third injection site was 1.5-3 cm forward between the first  2 injection sites. The fourth injection site was 1.5 cm posterior to the second injection site.   -Occipitalis muscle injection, 3 sites, bilaterally. The first injection was done one half way between the occipital protuberance and the tip of the mastoid process behind the ear. The second injection site was done lateral and superior to the first, 1 fingerbreadth from the first injection. The third injection site was 1 fingerbreadth superiorly and medially from the first injection site.  -Cervical paraspinal muscle injection, 2 sites, bilateral knee first injection site was 1 cm from the midline of the cervical spine, 3 cm inferior to the lower border of the occipital protuberance. The second injection site was 1.5 cm superiorly and laterally to the first injection site.  -Trapezius muscle injection was performed at 3 sites, bilaterally. The first injection site was in the upper trapezius muscle halfway between the inflection point of the neck, and the acromion. The second injection site was one half way between the acromion and the first injection site. The third injection was done between the first injection site and the inflection point of the neck.   Will return for repeat injection in 3 months.   155 units of Botox was used, 45U Botox not injected was wasted. The patient tolerated the procedure well, there were no complications of the above procedure.

## 2022-01-03 ENCOUNTER — Ambulatory Visit (INDEPENDENT_AMBULATORY_CARE_PROVIDER_SITE_OTHER): Payer: 59 | Admitting: Family Medicine

## 2022-01-03 ENCOUNTER — Encounter: Payer: Self-pay | Admitting: Family Medicine

## 2022-01-03 VITALS — BP 106/68 | HR 68 | Temp 98.1°F | Resp 16 | Ht 66.0 in | Wt 206.4 lb

## 2022-01-03 DIAGNOSIS — L578 Other skin changes due to chronic exposure to nonionizing radiation: Secondary | ICD-10-CM

## 2022-01-03 DIAGNOSIS — G43709 Chronic migraine without aura, not intractable, without status migrainosus: Secondary | ICD-10-CM

## 2022-01-03 DIAGNOSIS — E1169 Type 2 diabetes mellitus with other specified complication: Secondary | ICD-10-CM

## 2022-01-03 DIAGNOSIS — Z0000 Encounter for general adult medical examination without abnormal findings: Secondary | ICD-10-CM

## 2022-01-03 DIAGNOSIS — E782 Mixed hyperlipidemia: Secondary | ICD-10-CM

## 2022-01-03 DIAGNOSIS — G8929 Other chronic pain: Secondary | ICD-10-CM

## 2022-01-03 DIAGNOSIS — E559 Vitamin D deficiency, unspecified: Secondary | ICD-10-CM | POA: Diagnosis not present

## 2022-01-03 DIAGNOSIS — E6609 Other obesity due to excess calories: Secondary | ICD-10-CM | POA: Diagnosis not present

## 2022-01-03 DIAGNOSIS — E669 Obesity, unspecified: Secondary | ICD-10-CM

## 2022-01-03 DIAGNOSIS — M545 Low back pain, unspecified: Secondary | ICD-10-CM | POA: Diagnosis not present

## 2022-01-03 NOTE — Assessment & Plan Note (Signed)
Patient encouraged to maintain heart healthy diet, regular exercise, adequate sleep. Consider daily probiotics. Take medications as prescribed. Labs ordered and reviewed. Referred to dermatology for skin evaluation. Colonoscopy due in 2024. Pap due in 2024, MGM and immunizations UTD.

## 2022-01-03 NOTE — Patient Instructions (Signed)
Shingrix is the new shingles shot, 2 shots over 2-6 months, confirm coverage with insurance and document, then can return here for shots with nurse appt or at pharmacy   Preventive Care 60-52 Years Old, Female Preventive care refers to lifestyle choices and visits with your health care provider that can promote health and wellness. Preventive care visits are also called wellness exams. What can I expect for my preventive care visit? Counseling Your health care provider may ask you questions about your: Medical history, including: Past medical problems. Family medical history. Pregnancy history. Current health, including: Menstrual cycle. Method of birth control. Emotional well-being. Home life and relationship well-being. Sexual activity and sexual health. Lifestyle, including: Alcohol, nicotine or tobacco, and drug use. Access to firearms. Diet, exercise, and sleep habits. Work and work Statistician. Sunscreen use. Safety issues such as seatbelt and bike helmet use. Physical exam Your health care provider will check your: Height and weight. These may be used to calculate your BMI (body mass index). BMI is a measurement that tells if you are at a healthy weight. Waist circumference. This measures the distance around your waistline. This measurement also tells if you are at a healthy weight and may help predict your risk of certain diseases, such as type 2 diabetes and high blood pressure. Heart rate and blood pressure. Body temperature. Skin for abnormal spots. What immunizations do I need? Vaccines are usually given at various ages, according to a schedule. Your health care provider will recommend vaccines for you based on your age, medical history, and lifestyle or other factors, such as travel or where you work. What tests do I need? Screening Your health care provider may recommend screening tests for certain conditions. This may include: Lipid and cholesterol levels. Diabetes  screening. This is done by checking your blood sugar (glucose) after you have not eaten for a while (fasting). Pelvic exam and Pap test. Hepatitis B test. Hepatitis C test. HIV (human immunodeficiency virus) test. STI (sexually transmitted infection) testing, if you are at risk. Lung cancer screening. Colorectal cancer screening. Mammogram. Talk with your health care provider about when you should start having regular mammograms. This may depend on whether you have a family history of breast cancer. BRCA-related cancer screening. This may be done if you have a family history of breast, ovarian, tubal, or peritoneal cancers. Bone density scan. This is done to screen for osteoporosis. Talk with your health care provider about your test results, treatment options, and if necessary, the need for more tests. Follow these instructions at home: Eating and drinking  Eat a diet that includes fresh fruits and vegetables, whole grains, lean protein, and low-fat dairy products. Take vitamin and mineral supplements as recommended by your health care provider. Do not drink alcohol if: Your health care provider tells you not to drink. You are pregnant, may be pregnant, or are planning to become pregnant. If you drink alcohol: Limit how much you have to 0-1 drink a day. Know how much alcohol is in your drink. In the [place] one drink equals one 12 oz bottle of beer (355 mL), one 5 oz glass of wine (148 mL), or one 1 oz glass of hard liquor (44 mL). Lifestyle Brush your teeth every morning and night with fluoride toothpaste. Floss one time each day. Exercise for at least 30 minutes 5 or more days each week. Do not use any products that contain nicotine or tobacco. These products include cigarettes, chewing tobacco, and vaping devices, such as e-cigarettes. If  you need help quitting, ask your health care provider. Do not use drugs. If you are sexually active, practice safe sex. Use a condom or other form of  protection to prevent STIs. If you do not wish to become pregnant, use a form of birth control. If you plan to become pregnant, see your health care provider for a prepregnancy visit. Take aspirin only as told by your health care provider. Make sure that you understand how much to take and what form to take. Work with your health care provider to find out whether it is safe and beneficial for you to take aspirin daily. Find healthy ways to manage stress, such as: Meditation, yoga, or listening to music. Journaling. Talking to a trusted person. Spending time with friends and family. Minimize exposure to UV radiation to reduce your risk of skin cancer. Safety Always wear your seat belt while driving or riding in a vehicle. Do not drive: If you have been drinking alcohol. Do not ride with someone who has been drinking. When you are tired or distracted. While texting. If you have been using any mind-altering substances or drugs. Wear a helmet and other protective equipment during sports activities. If you have firearms in your house, make sure you follow all gun safety procedures. Seek help if you have been physically or sexually abused. What's next? Visit your health care provider once a year for an annual wellness visit. Ask your health care provider how often you should have your eyes and teeth checked. Stay up to date on all vaccines. This information is not intended to replace advice given to you by your health care provider. Make sure you discuss any questions you have with your health care provider. Document Revised: 05/23/2021 Document Reviewed: 05/23/2021 Elsevier Patient Education  [place].

## 2022-01-03 NOTE — Progress Notes (Signed)
Subjective:    Patient ID: Amanda James, female    DOB: May 22, 1971, 51 y.o.   MRN: [id number]  Chief Complaint  Patient presents with   Annual Exam    HPI Patient is in today for annual preventative exam and follow-up on chronic medical concerns.  Overall she feels well.  No recent febrile illness or hospitalizations.  She is up-to-date on her immunizations.  She tries to maintain a heart healthy diet and stay active.  She offers no complaints of insomnia or difficulty with sleep.  No complaints of polyuria or polydipsia.  She notes her migraines are generally well controlled on her current regimen but recently when she missed some Botox she did realize that they do help to control her migraines. Denies CP/palp/SOB/HA/congestion/fevers/GI or GU c/o. Taking meds as prescribed continues to struggle with low back pain and is following with sports medicine. Facet joint injections helped but only for a short time.  Past Medical History:  Diagnosis Date   Allergy    Cervical cancer screening 05/02/2017   Depression    Depression with anxiety    Diabetes mellitus type 2 in obese [facility])    Diabetes mellitus without complication ([place])    History of chicken pox    Hyperlipidemia    Low back pain    Migraines    Obesity 11/10/2016    Past Surgical History:  Procedure Laterality Date   ABDOMINOPLASTY  2012   AUGMENTATION MAMMAPLASTY     maxilift with implants    BLADDER SUSPENSION  2016   LAPAROSCOPIC GASTRIC BANDING  2005   esophageal dilation. band still present but released   MASTOPEXY  2012    Family History  Problem Relation Age of Onset   Diabetes Mother    Hypertension Mother    Hyperlipidemia Mother    Obesity Mother    Heart disease Mother        pacer   Diabetes Father    Heart disease Father        MI   Hyperlipidemia Father    Hypertension Father    Obesity Father    Diabetes Sister    Hyperlipidemia Sister    Hypertension Sister    Obesity Sister    Obesity  Maternal Grandmother    Hypertension Maternal Grandmother    Hyperlipidemia Maternal Grandmother    Diabetes Maternal Grandmother    Rosacea Maternal Grandmother    Non-Hodgkin's lymphoma Maternal Grandmother    Arthritis Maternal Grandmother        spinal stenosis   Multiple myeloma Maternal Grandfather    Hyperlipidemia Paternal Grandmother        rhabdo from statins   Kidney disease Paternal Grandmother    Migraines Neg Hx     Social History   Socioeconomic History   Marital status: Married    Spouse name: Not on file   Number of children: 1   Years of education: MD   Highest education level: Not on file  Occupational History   Occupation: [employer]  Tobacco Use   Smoking status: Never   Smokeless tobacco: Never  Substance and Sexual Activity   Alcohol use: Yes    Alcohol/week: 5.0 standard drinks    Types: 5 Glasses of wine per week    Comment: 3-5 glasses of wine per week   Drug use: No   Sexual activity: Not on file  Other Topics Concern   Not on file  Social History Narrative   Works  [employer], lives with husband, 3 cats  ° No major dietary restrictions. Exercise 30 minutes 3 x a week  ° Seat belts routinely  ° Right-handed  ° Caffeine: 1-2 cups of coffee per day  ° °Social Determinants of Health  ° °Financial Resource Strain: Not on file  °Food Insecurity: Not on file  °Transportation Needs: Not on file  °Physical Activity: Not on file  °Stress: Not on file  °Social Connections: Not on file  °Intimate Partner Violence: Not on file  ° ° °Outpatient Medications Prior to Visit  °Medication Sig Dispense Refill  ° aspirin EC 81 MG tablet Take 1 tablet (81 mg total) by mouth daily. Swallow whole. 90 tablet 3  ° atorvastatin (LIPITOR) 20 MG tablet Take 1 tablet (20 mg total) by mouth daily. 90 tablet 1  ° blood glucose meter kit and supplies Dispense based on patient and insurance preference. Use up to four times daily as directed. Dx: E11.9 1 each 0  ° Blood Glucose  Monitoring Suppl (FREESTYLE LITE) w/Device KIT Use to test blood sugar 4 times a day 1 kit 0  ° Cetirizine HCl 10 MG CAPS Take 1 capsule by mouth daily.    ° conjugated estrogens (PREMARIN) vaginal cream Place 1 applicatorful vaginally 2 (two) times a week. 42.5 g 1  ° cyclobenzaprine (FLEXERIL) 10 MG tablet TAKE 1 TABLET BY MOUTH 3 TIMES DAILY AS NEEDED FOR MUSCLE SPASM 30 tablet 2  ° Erenumab-aooe (AIMOVIG) 140 MG/ML SOAJ Inject 140 mg into the skin every 30 (thirty) days. 1 pen 5  ° escitalopram (LEXAPRO) 10 MG tablet TAKE 1 TABLET BY MOUTH EVERY EVENING 90 tablet 1  ° escitalopram (LEXAPRO) 20 MG tablet TAKE 1 TABLET BY MOUTH EVERY MORNING 90 tablet 1  ° glucose blood test strip Use to test blood sugar 4 times a day 100 each 0  ° Lancets (FREESTYLE) lancets Use to test blood sugar 4 times a day 100 each 0  ° tirzepatide (MOUNJARO) 15 MG/0.5ML Pen Inject 15 mg into the skin once a week. 6 mL 5  ° tretinoin (RETIN-A) 0.05 % cream Apply topically at bedtime. 135 g 1  ° Ubrogepant (UBRELVY) 100 MG TABS Take 1 tablet by mouth every 2 hours as needed as directed. Maximum 2 tablets per day. 8 tablet 6  ° °Facility-Administered Medications Prior to Visit  °Medication Dose Route Frequency Provider Last Rate Last Admin  ° gadobenate dimeglumine (MULTIHANCE) injection 20 mL  20 mL Intravenous Once PRN Ahern, Antonia B, MD      ° sodium chloride flush (NS) 0.9 % injection 10 mL  10 mL Intravenous PRN Ahern, Antonia B, MD   20 mL at 10/26/20 1345  ° ° °Allergies  °Allergen Reactions  ° Erythromycin Diarrhea  ° ° °Review of Systems  °Constitutional:  Negative for chills, fever and malaise/fatigue.  °HENT:  Negative for congestion and hearing loss.   °Eyes:  Negative for discharge.  °Respiratory:  Negative for cough, sputum production and shortness of breath.   °Cardiovascular:  Negative for chest pain, palpitations and leg swelling.  °Gastrointestinal:  Negative for abdominal pain, blood in stool, constipation, diarrhea,  heartburn, nausea and vomiting.  °Genitourinary:  Negative for dysuria, frequency, hematuria and urgency.  °Musculoskeletal:  Negative for back pain, falls and myalgias.  °Skin:  Negative for rash.  °Neurological:  Negative for dizziness, sensory change, loss of consciousness, weakness and headaches.  °Endo/Heme/Allergies:  Negative for environmental allergies. Does not bruise/bleed easily.  °Psychiatric/Behavioral:  Negative   Negative for depression and suicidal ideas. The patient is not nervous/anxious and does not have insomnia.       Objective:    Physical Exam Constitutional:      General: She is not in acute distress.    Appearance: She is well-developed.  HENT:     Head: Normocephalic and atraumatic.  Eyes:     Conjunctiva/sclera: Conjunctivae normal.  Neck:     Thyroid: No thyromegaly.  Cardiovascular:     Rate and Rhythm: Normal rate and regular rhythm.     Heart sounds: Normal heart sounds. No murmur heard. Pulmonary:     Effort: Pulmonary effort is normal. No respiratory distress.     Breath sounds: Normal breath sounds.  Abdominal:     General: Bowel sounds are normal. There is no distension.     Palpations: Abdomen is soft. There is no mass.     Tenderness: There is no abdominal tenderness.  Musculoskeletal:     Cervical back: Neck supple.  Lymphadenopathy:     Cervical: No cervical adenopathy.  Skin:    General: Skin is warm and dry.  Neurological:     Mental Status: She is alert and oriented to person, place, and time.  Psychiatric:        Behavior: Behavior normal.    BP 106/68    Pulse 68    Temp 98.1 F (36.7 C)    Resp 16    Ht 5' 6" (1.676 m)    Wt 206 lb 6.4 oz (93.6 kg)    SpO2 99%    BMI 33.31 kg/m  Wt Readings from Last 3 Encounters:  01/03/22 206 lb 6.4 oz (93.6 kg)  11/09/21 206 lb 12.8 oz (93.8 kg)  10/04/21 207 lb 12.8 oz (94.3 kg)    Diabetic Foot Exam - Simple   No data filed    Lab Results  Component Value Date   WBC 7.6 10/04/2021   HGB 13.5  10/04/2021   HCT 40.4 10/04/2021   PLT 311.0 10/04/2021   GLUCOSE 84 10/04/2021   CHOL 144 10/04/2021   TRIG 75.0 10/04/2021   HDL 60.00 10/04/2021   LDLCALC 69 10/04/2021   ALT 21 10/04/2021   AST 19 10/04/2021   NA 137 10/04/2021   K 4.6 10/04/2021   CL 103 10/04/2021   CREATININE 0.96 10/04/2021   BUN 16 10/04/2021   CO2 28 10/04/2021   TSH 2.85 10/04/2021   INR 1.04 09/17/2016   HGBA1C 5.3 10/04/2021   MICROALBUR <0.7 09/10/2019    Lab Results  Component Value Date   TSH 2.85 10/04/2021   Lab Results  Component Value Date   WBC 7.6 10/04/2021   HGB 13.5 10/04/2021   HCT 40.4 10/04/2021   MCV 94.4 10/04/2021   PLT 311.0 10/04/2021   Lab Results  Component Value Date   NA 137 10/04/2021   K 4.6 10/04/2021   CO2 28 10/04/2021   GLUCOSE 84 10/04/2021   BUN 16 10/04/2021   CREATININE 0.96 10/04/2021   BILITOT 1.7 (H) 10/04/2021   ALKPHOS 82 10/04/2021   AST 19 10/04/2021   ALT 21 10/04/2021   PROT 7.2 10/04/2021   ALBUMIN 4.3 10/04/2021   CALCIUM 9.7 10/04/2021   ANIONGAP 5 09/17/2016   GFR 68.66 10/04/2021   Lab Results  Component Value Date   CHOL 144 10/04/2021   Lab Results  Component Value Date   HDL 60.00 10/04/2021   Lab Results  Component Value Date  [place] 69 10/04/2021   Lab Results  Component Value Date   TRIG 75.0 10/04/2021   Lab Results  Component Value Date   CHOLHDL 2 10/04/2021   Lab Results  Component Value Date   HGBA1C 5.3 10/04/2021       Assessment & Plan:   Problem List Items Addressed This Visit     Diabetes mellitus type 2 in obese ([place]) - Primary    hgba1c acceptable, minimize simple carbs. Increase exercise as tolerated. Continue current meds. Discussed the possibility of patient joining the [facility] Study and she is given paperwork to review. She will decide and let us know      Relevant Orders   Hemoglobin A1c   Urine Microalbumin w/creat. ratio   Hyperlipidemia    Encourage heart healthy diet such as  MIND or DASH diet, increase exercise, avoid trans fats, simple carbohydrates and processed foods, consider a krill or fish or flaxseed oil cap daily.       Relevant Orders   Comprehensive metabolic panel   Lipid panel   TSH   CBC with Differential/Platelet   Obesity    Patient doing well on current regimen but offered chance to participate in TWIN study      Vitamin D deficiency    Supplement and monitor      Relevant Orders   Vitamin D 1,25 dihydroxy   Preventative health care    Patient encouraged to maintain heart healthy diet, regular exercise, adequate sleep. Consider daily probiotics. Take medications as prescribed. Labs ordered and reviewed. Referred to dermatology for skin evaluation. Colonoscopy due in 2024. Pap due in 2024, MGM and immunizations UTD.      Chronic migraine w/o aura w/o status migrainosus, not intractable    Continues to follow with [facility], Dr Jaynee Eagles and is responding to current regimen including botox      Chronic bilateral low back pain without sciatica    Following with sports med and continues to struggle.       Other Visit Diagnoses     Sun-damaged skin       Relevant Orders   Ambulatory referral to Dermatology       I am having Dr. Starlyn Skeans maintain her Cetirizine HCl, Erenumab-aooe, aspirin EC, tirzepatide, cyclobenzaprine, atorvastatin, conjugated estrogens, tretinoin, blood glucose meter kit and supplies, FreeStyle Lite, glucose blood, freestyle, escitalopram, escitalopram, and Ubrelvy.  No orders of the defined types were placed in this encounter.    Penni Homans, MD``

## 2022-01-03 NOTE — Assessment & Plan Note (Signed)
Following with sports med and continues to struggle.

## 2022-01-03 NOTE — Assessment & Plan Note (Signed)
Encourage heart healthy diet such as MIND or DASH diet, increase exercise, avoid trans fats, simple carbohydrates and processed foods, consider a krill or fish or flaxseed oil cap daily.  °

## 2022-01-03 NOTE — Assessment & Plan Note (Signed)
Continues to follow with [facility], Dr Lucia Gaskins and is responding to current regimen including botox

## 2022-01-03 NOTE — Assessment & Plan Note (Signed)
Patient doing well on current regimen but offered chance to participate in TWIN study

## 2022-01-03 NOTE — Assessment & Plan Note (Signed)
hgba1c acceptable, minimize simple carbs. Increase exercise as tolerated. Continue current meds. Discussed the possibility of patient joining the Twin Study and she is given paperwork to review. She will decide and let us know

## 2022-01-03 NOTE — Assessment & Plan Note (Signed)
Supplement and monitor 

## 2022-01-07 ENCOUNTER — Encounter: Payer: Self-pay | Admitting: Family Medicine

## 2022-01-11 ENCOUNTER — Other Ambulatory Visit: Payer: Self-pay | Admitting: Family Medicine

## 2022-01-11 ENCOUNTER — Other Ambulatory Visit (HOSPITAL_COMMUNITY): Payer: Self-pay

## 2022-01-12 ENCOUNTER — Other Ambulatory Visit (HOSPITAL_COMMUNITY): Payer: Self-pay

## 2022-01-14 ENCOUNTER — Other Ambulatory Visit (HOSPITAL_COMMUNITY): Payer: Self-pay

## 2022-01-24 ENCOUNTER — Other Ambulatory Visit (HOSPITAL_COMMUNITY): Payer: Self-pay

## 2022-01-24 ENCOUNTER — Other Ambulatory Visit: Payer: Self-pay | Admitting: Family Medicine

## 2022-01-24 DIAGNOSIS — N952 Postmenopausal atrophic vaginitis: Secondary | ICD-10-CM

## 2022-01-24 MED ORDER — PREMARIN 0.625 MG/GM VA CREA
1.0000 | TOPICAL_CREAM | VAGINAL | 1 refills | Status: DC
  Filled 2022-01-24: qty 30, 90d supply, fill #0
  Filled 2022-04-14: qty 30, 90d supply, fill #1
  Filled 2022-05-15: qty 30, 90d supply, fill #2

## 2022-01-25 ENCOUNTER — Other Ambulatory Visit: Payer: Self-pay | Admitting: Family Medicine

## 2022-01-25 DIAGNOSIS — M545 Low back pain, unspecified: Secondary | ICD-10-CM

## 2022-02-12 ENCOUNTER — Other Ambulatory Visit (HOSPITAL_COMMUNITY): Payer: Self-pay

## 2022-02-12 ENCOUNTER — Other Ambulatory Visit: Payer: Self-pay | Admitting: Family Medicine

## 2022-02-13 ENCOUNTER — Other Ambulatory Visit (HOSPITAL_COMMUNITY): Payer: Self-pay

## 2022-02-13 ENCOUNTER — Ambulatory Visit
Admission: RE | Admit: 2022-02-13 | Discharge: 2022-02-13 | Disposition: A | Discharge status: Home / Self Care | Payer: 59 | Source: Ambulatory Visit | Attending: Family Medicine | Admitting: Family Medicine

## 2022-02-13 ENCOUNTER — Other Ambulatory Visit: Payer: Self-pay | Admitting: Family Medicine

## 2022-02-13 DIAGNOSIS — M545 Low back pain, unspecified: Secondary | ICD-10-CM | POA: Diagnosis not present

## 2022-02-13 DIAGNOSIS — G8929 Other chronic pain: Secondary | ICD-10-CM

## 2022-02-13 MED FILL — Lancets: 25 days supply | Qty: 100 | Fill #0 | Status: AC

## 2022-02-13 MED FILL — Glucose Blood Test Strip: 25 days supply | Qty: 100 | Fill #0 | Status: AC

## 2022-02-13 NOTE — Discharge Instructions (Signed)

## 2022-02-14 ENCOUNTER — Other Ambulatory Visit (HOSPITAL_COMMUNITY): Payer: Self-pay

## 2022-02-14 NOTE — Progress Notes (Signed)
Sounds like you had a good amount of pain relief after the medial branch block so we can be confident that the ablation will work well and last a long time.  This is good news.

## 2022-02-21 ENCOUNTER — Ambulatory Visit
Admission: RE | Admit: 2022-02-21 | Discharge: 2022-02-21 | Disposition: A | Discharge status: Home / Self Care | Payer: 59 | Source: Ambulatory Visit | Attending: Family Medicine | Admitting: Family Medicine

## 2022-02-21 ENCOUNTER — Other Ambulatory Visit: Payer: Self-pay | Admitting: Family Medicine

## 2022-02-21 ENCOUNTER — Other Ambulatory Visit: Payer: Self-pay

## 2022-02-21 DIAGNOSIS — M545 Low back pain, unspecified: Secondary | ICD-10-CM | POA: Diagnosis not present

## 2022-02-21 MED ORDER — SODIUM CHLORIDE 0.9 % IV SOLN: INTRAVENOUS | Status: DC

## 2022-02-21 MED ORDER — FENTANYL CITRATE PF 50 MCG/ML IJ SOSY
25.0000 ug | PREFILLED_SYRINGE | INTRAMUSCULAR | Status: DC | PRN
  Administered 2022-02-21: 25 ug via INTRAVENOUS
  Administered 2022-02-21: 50 ug via INTRAVENOUS
  Administered 2022-02-21: 25 ug via INTRAVENOUS

## 2022-02-21 MED ORDER — METHYLPREDNISOLONE ACETATE 40 MG/ML INJ SUSP (RADIOLOG
120.0000 mg | Freq: Once | INTRAMUSCULAR | Status: AC
  Administered 2022-02-21: 120 mg via INTRALESIONAL

## 2022-02-21 MED ORDER — MIDAZOLAM HCL 2 MG/2ML IJ SOLN
1.0000 mg | INTRAMUSCULAR | Status: DC | PRN
  Administered 2022-02-21 (×3): 0.5 mg via INTRAVENOUS
  Administered 2022-02-21: 1 mg via INTRAVENOUS
  Administered 2022-02-21 (×3): 0.5 mg via INTRAVENOUS

## 2022-02-21 MED ORDER — KETOROLAC TROMETHAMINE 30 MG/ML IJ SOLN
30.0000 mg | Freq: Once | INTRAMUSCULAR | Status: AC
  Administered 2022-02-21: 30 mg via INTRAVENOUS

## 2022-02-21 NOTE — Progress Notes (Signed)
Pt back in nursing recovery area. Pt awake and alert post procedure, talks in complete sentences and has no complaints at this time. Pt will remain in nursing area until discharged by Radiologist.   ?

## 2022-02-21 NOTE — Discharge Instructions (Signed)
Radio Frequency Ablation Post Procedure Discharge Instructions ? ?May resume a regular diet and any medications that you routinely take (including pain medications). ?No driving day of procedure. ?Upon discharge go home and rest for at least 4 hours.  May use an ice pack as needed to injection sites on back. ?Remove bandades later, today. ? ? ? ?Please contact our office at [phone] for the following symptoms: ? ?Fever greater than 100 degrees ?Increased swelling, pain, or redness at injection site. ? ? ?Thank you for visiting [facility].  ?

## 2022-02-26 ENCOUNTER — Telehealth: Payer: Self-pay | Admitting: Neurology

## 2022-02-26 NOTE — Telephone Encounter (Signed)
Patient has a Botox appointment on 04/18 Insurance [facility]) requires PA for Botox. Fax records to [phone]. Pending reference #[id number]. ?

## 2022-03-10 ENCOUNTER — Other Ambulatory Visit: Payer: Self-pay | Admitting: Family Medicine

## 2022-03-11 ENCOUNTER — Other Ambulatory Visit (HOSPITAL_COMMUNITY): Payer: Self-pay

## 2022-03-11 MED ORDER — FREESTYLE LANCETS MISC
0 refills | Status: DC
Start: 2022-03-11 — End: 2022-06-06
  Filled 2022-03-11: qty 100, 25d supply, fill #0

## 2022-03-11 MED ORDER — ATORVASTATIN CALCIUM 20 MG PO TABS
20.0000 mg | ORAL_TABLET | Freq: Every day | ORAL | 1 refills | Status: DC
  Filled 2022-03-11: qty 90, 90d supply, fill #0
  Filled 2022-06-13: qty 90, 90d supply, fill #1

## 2022-03-11 MED ORDER — FREESTYLE LITE TEST VI STRP
ORAL_STRIP | 0 refills | Status: DC
  Filled 2022-03-11: qty 100, 25d supply, fill #0

## 2022-03-12 ENCOUNTER — Other Ambulatory Visit (HOSPITAL_COMMUNITY): Payer: Self-pay

## 2022-03-12 ENCOUNTER — Telehealth: Payer: 59 | Admitting: Nurse Practitioner

## 2022-03-12 DIAGNOSIS — U071 COVID-19: Secondary | ICD-10-CM | POA: Diagnosis not present

## 2022-03-12 MED ORDER — MOLNUPIRAVIR EUA 200MG CAPSULE
4.0000 | ORAL_CAPSULE | Freq: Two times a day (BID) | ORAL | 0 refills | Status: AC
  Filled 2022-03-12: qty 40, 5d supply, fill #0

## 2022-03-12 NOTE — Progress Notes (Signed)
?Virtual Visit Consent  ? ?Amanda James, you are scheduled for a virtual visit with a [facility] provider today.   ?  ?Just as with appointments in the office, your consent must be obtained to participate.  Your consent will be active for this visit and any virtual visit you may have with one of our providers in the next 365 days.   ?  ?If you have a MyChart account, a copy of this consent can be sent to you electronically.  All virtual visits are billed to your insurance company just like a traditional visit in the office.   ? ?As this is a virtual visit, video technology does not allow for your provider to perform a traditional examination.  This may limit your provider's ability to fully assess your condition.  If your provider identifies any concerns that need to be evaluated in person or the need to arrange testing (such as labs, EKG, etc.), we will make arrangements to do so.   ?  ?Although advances in technology are sophisticated, we cannot ensure that it will always work on either your end or our end.  If the connection with a video visit is poor, the visit may have to be switched to a telephone visit.  With either a video or telephone visit, we are not always able to ensure that we have a secure connection.    ? ?I need to obtain your verbal consent now.   Are you willing to proceed with your visit today?  ?  ?Amanda James has provided verbal consent on 03/12/2022 for a virtual visit (video or telephone). ?  ?Apolonio Schneiders, FNP  ? ?Date: 03/12/2022 11:22 AM ? ? ?Virtual Visit via Video Note  ? ?IApolonio Schneiders, connected with  Amanda James  ([id number], November 24, 1971) on 03/12/22 at 11:30 AM EDT by a video-enabled telemedicine application and verified that I am speaking with the correct person using two identifiers. ? ?Location: ?Patient: Virtual Visit Location Patient: Home ?Provider: Virtual Visit Location Provider: Home Office ?  ?I discussed the limitations of evaluation and management by telemedicine  and the availability of in person appointments. The patient expressed understanding and agreed to proceed.   ? ?History of Present Illness: ?Amanda James is a 51 y.o. who identifies as a female who was assigned female at birth, and is being seen today after testing positive for COVID-19 today with a home test. She has been having symptoms since yesterday with mild congestion, a cough and fatigue.  ? ?Her husband tested positive 4 days ago ? ?She has not had COVID in the past  ?She has been vaccinated x3  ? ?Denies a history of asthma denies a tobacco history  ?She does have Diabetes Denies HTN  ?Denies immunocompromised status.  ? ?She has been using Naproxen and benadryl for symptom relief.  ? ?Problems:  ?Patient Active Problem List  ? Diagnosis Date Noted  ? PFO (patent foramen ovale) 02/11/2021  ? RLS (restless legs syndrome) 02/11/2021  ? CAD (coronary artery disease) 02/11/2021  ? Rosacea 10/13/2020  ? Perimenopause 10/13/2020  ? Class 1 obesity due to excess calories with serious comorbidity and body mass index (BMI) of 34.0 to 34.9 in adult 09/11/2020  ? Loud snoring 09/11/2020  ? Sleep-related headache 09/11/2020  ? Recurrent UTI 12/26/2019  ? Shoulder pain, left 12/26/2019  ? Chronic bilateral low back pain without sciatica 06/28/2018  ? Cervical cancer screening 05/02/2017  ? Chronic migraine w/o aura w/o  status migrainosus, not intractable 04/06/2017  ? Obesity 11/10/2016  ? Vitamin D deficiency 11/10/2016  ? Preventative health care 11/10/2016  ? History of chicken pox   ? Allergy   ? Anxiety and depression   ? Diabetes mellitus type 2 in obese [facility])   ? Hyperlipidemia   ?  ?Allergies:  ?Allergies  ?Allergen Reactions  ? Erythromycin Diarrhea  ? ?Medications:  ?Current Outpatient Medications:  ?  aspirin EC 81 MG tablet, Take 1 tablet (81 mg total) by mouth daily. Swallow whole., Disp: 90 tablet, Rfl: 3 ?  atorvastatin (LIPITOR) 20 MG tablet, Take 1 tablet (20 mg total) by mouth daily., Disp: 90  tablet, Rfl: 1 ?  blood glucose meter kit and supplies, Dispense based on patient and insurance preference. Use up to four times daily as directed. Dx: E11.9, Disp: 1 each, Rfl: 0 ?  Blood Glucose Monitoring Suppl (FREESTYLE LITE) w/Device KIT, Use to test blood sugar 4 times a day, Disp: 1 kit, Rfl: 0 ?  Cetirizine HCl 10 MG CAPS, Take 1 capsule by mouth daily., Disp: , Rfl:  ?  conjugated estrogens (PREMARIN) vaginal cream, Place 1 applicatorful vaginally 2 (two) times a week., Disp: 42.5 g, Rfl: 1 ?  cyclobenzaprine (FLEXERIL) 10 MG tablet, TAKE 1 TABLET BY MOUTH 3 TIMES DAILY AS NEEDED FOR MUSCLE SPASM, Disp: 30 tablet, Rfl: 2 ?  Erenumab-aooe (AIMOVIG) 140 MG/ML SOAJ, Inject 140 mg into the skin every 30 (thirty) days., Disp: 1 pen, Rfl: 5 ?  escitalopram (LEXAPRO) 10 MG tablet, TAKE 1 TABLET BY MOUTH EVERY EVENING, Disp: 90 tablet, Rfl: 1 ?  escitalopram (LEXAPRO) 20 MG tablet, TAKE 1 TABLET BY MOUTH EVERY MORNING, Disp: 90 tablet, Rfl: 1 ?  glucose blood (FREESTYLE LITE) test strip, Use to test blood sugar 4 times a day, Disp: 100 each, Rfl: 0 ?  Lancets (FREESTYLE) lancets, Use to test blood sugar 4 times a day, Disp: 100 each, Rfl: 0 ?  tirzepatide (MOUNJARO) 15 MG/0.5ML Pen, Inject 15 mg into the skin once a week., Disp: 6 mL, Rfl: 5 ?  tretinoin (RETIN-A) 0.05 % cream, Apply topically at bedtime., Disp: 135 g, Rfl: 1 ?  Ubrogepant (UBRELVY) 100 MG TABS, Take 1 tablet by mouth every 2 hours as needed as directed. Maximum 2 tablets per day., Disp: 8 tablet, Rfl: 6 ?No current facility-administered medications for this visit. ? ?Facility-Administered Medications Ordered in Other Visits:  ?  gadobenate dimeglumine (MULTIHANCE) injection 20 mL, 20 mL, Intravenous, Once PRN, Amanda Ill B, MD ?  sodium chloride flush (NS) 0.9 % injection 10 mL, 10 mL, Intravenous, PRN, Amanda Beam, MD, 20 mL at 10/26/20 1345 ? ?Observations/Objective: ?Patient is well-developed, well-nourished in no acute distress.   ?Resting comfortably  at home.  ?Head is normocephalic, atraumatic.  ?No labored breathing.  ?Speech is clear and coherent with logical content.  ?Patient is alert and oriented at baseline.  ? ? ?Assessment and Plan: ?1. COVID-19 ? ?- molnupiravir EUA (LAGEVRIO) 200 mg CAPS capsule; Take 4 capsules (800 mg total) by mouth 2 (two) times daily for 5 days.  Dispense: 40 capsule; Refill: 0 ?   ? ?Follow Up Instructions: ?I discussed the assessment and treatment plan with the patient. The patient was provided an opportunity to ask questions and all were answered. The patient agreed with the plan and demonstrated an understanding of the instructions.  A copy of instructions were sent to the patient via MyChart unless otherwise noted below.  ? ? ?  The patient was advised to call back or seek an in-person evaluation if the symptoms worsen or if the condition fails to improve as anticipated. ? ?Time:  ?I spent 15 minutes with the patient via telehealth technology discussing the above problems/concerns.   ? ?Apolonio Schneiders, FNP  ?

## 2022-03-18 NOTE — Telephone Encounter (Addendum)
Received approval from [place], [place]  # [id number] (03/05/2022-03/05/2023). ?

## 2022-03-26 ENCOUNTER — Ambulatory Visit (INDEPENDENT_AMBULATORY_CARE_PROVIDER_SITE_OTHER): Payer: 59 | Admitting: Neurology

## 2022-03-26 DIAGNOSIS — G43709 Chronic migraine without aura, not intractable, without status migrainosus: Secondary | ICD-10-CM | POA: Diagnosis not present

## 2022-03-26 NOTE — Progress Notes (Signed)
Consent Form ?Botulism Toxin Injection For Chronic Migraine ? ? ? ?03/26/2021: Fantastic response> 70% improvement migraine and headache freq and severity.  ? ? ?Reviewed orally with patient, additionally signature is on file: ? ?Botulism toxin has been approved by the Federal drug administration for treatment of chronic migraine. Botulism toxin does not cure chronic migraine and it may not be effective in some patients. ? ?The administration of botulism toxin is accomplished by injecting a small amount of toxin into the muscles of the neck and head. Dosage must be titrated for each individual. Any benefits resulting from botulism toxin tend to wear off after 3 months with a repeat injection required if benefit is to be maintained. Injections are usually done every 3-4 months with maximum effect peak achieved by about 2 or 3 weeks. Botulism toxin is expensive and you should be sure of what costs you will incur resulting from the injection. ? ?The side effects of botulism toxin use for chronic migraine may include: ? ? -Transient, and usually mild, facial weakness with facial injections ? -Transient, and usually mild, head or neck weakness with head/neck injections ? -Reduction or loss of forehead facial animation due to forehead muscle weakness ? -Eyelid drooping ? -Dry eye ? -Pain at the site of injection or bruising at the site of injection ? -Double vision ? -Potential unknown long term risks ? ?Contraindications: You should not have Botox if you are pregnant, nursing, allergic to albumin, have an infection, skin condition, or muscle weakness at the site of the injection, or have myasthenia gravis, Lambert-Eaton syndrome, or ALS. ? ?It is also possible that as with any injection, there may be an allergic reaction or no effect from the medication. Reduced effectiveness after repeated injections is sometimes seen and rarely infection at the injection site may occur. All care will be taken to prevent these side  effects. If therapy is given over a long time, atrophy and wasting in the muscle injected may occur. Occasionally the patient's become refractory to treatment because they develop antibodies to the toxin. In this event, therapy needs to be modified. ? ?I have read the above information and consent to the administration of botulism toxin. ? ? ? ?BOTOX PROCEDURE NOTE FOR MIGRAINE HEADACHE ? ? ? ?Contraindications and precautions discussed with patient(above). Aseptic procedure was observed and patient tolerated procedure. Procedure performed by Dr. Artemio Aly ? ?The condition has existed for more than 6 months, and pt does not have a diagnosis of ALS, Myasthenia Gravis or Lambert-Eaton Syndrome.  Risks and benefits of injections discussed and pt agrees to proceed with the procedure.  Written consent obtained ? ?These injections are medically necessary. Pt  receives good benefits from these injections. These injections do not cause sedations or hallucinations which the oral therapies may cause. ? ?Description of procedure: ? ?The patient was placed in a sitting position. The standard protocol was used for Botox as follows, with 5 units of Botox injected at each site: ? ? ?-Procerus muscle, midline injection ? ?-Corrugator muscle, bilateral injection ? ?-Frontalis muscle, bilateral injection, with 2 sites each side, medial injection was performed in the upper one third of the frontalis muscle, in the region vertical from the medial inferior edge of the superior orbital rim. The lateral injection was again in the upper one third of the forehead vertically above the lateral limbus of the cornea, 1.5 cm lateral to the medial injection site. ? ?-Temporalis muscle injection, 4 sites, bilaterally. The first injection was 3 cm  above the tragus of the ear, second injection site was 1.5 cm to 3 cm up from the first injection site in line with the tragus of the ear. The third injection site was 1.5-3 cm forward between the first  2 injection sites. The fourth injection site was 1.5 cm posterior to the second injection site.  ? ?-Occipitalis muscle injection, 3 sites, bilaterally. The first injection was done one half way between the occipital protuberance and the tip of the mastoid process behind the ear. The second injection site was done lateral and superior to the first, 1 fingerbreadth from the first injection. The third injection site was 1 fingerbreadth superiorly and medially from the first injection site. ? ?-Cervical paraspinal muscle injection, 2 sites, bilateral knee first injection site was 1 cm from the midline of the cervical spine, 3 cm inferior to the lower border of the occipital protuberance. The second injection site was 1.5 cm superiorly and laterally to the first injection site. ? ?-Trapezius muscle injection was performed at 3 sites, bilaterally. The first injection site was in the upper trapezius muscle halfway between the inflection point of the neck, and the acromion. The second injection site was one half way between the acromion and the first injection site. The third injection was done between the first injection site and the inflection point of the neck. ? ? ?Will return for repeat injection in 3 months. ? ? ?155 units of Botox was used, 45U Botox not injected was wasted. The patient tolerated the procedure well, there were no complications of the above procedure. ? ? ?

## 2022-03-26 NOTE — Progress Notes (Signed)
Botox- 200 units x 1 vial ?Lot: [id number] ?Expiration: 11/2024 ?NDC: 3220-2542-70 ? ?0.9% Sodium Chloride- 52mL total ?Lot: [id number] ?Expiration: 01/09/2023 ?NDC: 8315-1761-60 ? ?Dx: V37.106 ?B/B ? ?

## 2022-03-31 ENCOUNTER — Telehealth: Payer: 59 | Admitting: Family

## 2022-03-31 DIAGNOSIS — H6690 Otitis media, unspecified, unspecified ear: Secondary | ICD-10-CM

## 2022-03-31 MED ORDER — CIPROFLOXACIN-DEXAMETHASONE 0.3-0.1 % OT SUSP: 4.0000 [drp] | Freq: Two times a day (BID) | OTIC | 0 refills | Status: DC

## 2022-03-31 NOTE — Progress Notes (Signed)
E Visit for Swimmer's Ear ? ?We are sorry that you are not feeling well. Here is how we plan to help! ? ?Based on what you have shared with me it looks like you have swimmers ear. Swimmer's ear is a redness or swelling, irritation, or infection of your outer ear canal.  These symptoms usually occur within a few days of swimming.  Your ear canal is a tube that goes from the opening of the ear to the eardrum.  When water stays in your ear canal, germs can grow.  This is a painful condition that often happens to children and swimmers of all ages.  It is not contagious and oral antibiotics are not required to treat uncomplicated swimmer's ear.  The usual symptoms include: Itching inside the ear, Redness or a sense of swelling in the ear, Pain when the ear is tugged on when pressure is placed on the ear, Pus draining from the infected ear. and I have prescribed: Ciprofloxin 0.2% and dexamethosone otic suspension 4 drops in affected ears twice daily for 7 days ? ? ?In certain cases swimmer's ear may progress to a more serious bacterial infection of the middle or inner ear.  If you have a fever 102 and up and significantly worsening symptoms, this could indicate a more serious infection moving to the middle/inner and needs face to face evaluation in an office by a provider. ? ?Your symptoms should improve over the next 3 days and should resolve in about 7 days. ? ?HOME CARE: ? ?Wash your hands frequently. ?Do not place the tip of the bottle on your ear or touch it with your fingers. ?You can take Acetominophen 650 mg every 4-6 hours as needed for pain.  If pain is severe or moderate, you can apply a heating pad (set on low) or hot water bottle (wrapped in a towel) to outer ear for 20 minutes.  This will also increase drainage. ?Avoid ear plugs ?Do not use Q-tips ?After showers, help the water run out by tilting your head to one side. ? ?GET HELP RIGHT AWAY IF: ? ?Fever is over 102.2 degrees. ?You develop progressive ear  pain or hearing loss. ?Ear symptoms persist longer than 3 days after treatment. ? ?MAKE SURE YOU: ? ?Understand these instructions. ?Will watch your condition. ?Will get help right away if you are not doing well or get worse. ? ?TO PREVENT SWIMMER'S EAR: ?Use a bathing cap or custom fitted swim molds to keep your ears dry. ?Towel off after swimming to dry your ears. ?Tilt your head or pull your earlobes to allow the water to escape your ear canal. ?If there is still water in your ears, consider using a hairdryer on the lowest setting. ? ? ?Thank you for choosing an e-visit. ? ?Your e-visit answers were reviewed by a board certified advanced clinical practitioner to complete your personal care plan. Depending upon the condition, your plan could have included both over the counter or prescription medications. ? ?Please review your pharmacy choice. Make sure the pharmacy is open so you can pick up prescription now. If there is a problem, you may contact your provider through [employer] and have the prescription routed to another pharmacy.  Your safety is important to [place]. If you have drug allergies check your prescription carefully.  ? ?For the next 24 hours you can use MyChart to ask questions about today's visit, request a non-urgent call back, or ask for a work or school excuse. ?You will get an  email in the next two days asking about your experience. I hope that your e-visit has been valuable and will speed your recovery. ? ?Approximately 5 minutes was spent documenting and reviewing patient's chart.  ? ? ?

## 2022-04-15 ENCOUNTER — Other Ambulatory Visit (HOSPITAL_COMMUNITY): Payer: Self-pay

## 2022-04-25 ENCOUNTER — Ambulatory Visit: Payer: 59 | Admitting: Family Medicine

## 2022-05-06 DIAGNOSIS — Z760 Encounter for issue of repeat prescription: Secondary | ICD-10-CM | POA: Diagnosis not present

## 2022-05-13 ENCOUNTER — Encounter: Payer: Self-pay | Admitting: Family Medicine

## 2022-05-15 ENCOUNTER — Other Ambulatory Visit (HOSPITAL_COMMUNITY): Payer: Self-pay

## 2022-05-15 ENCOUNTER — Other Ambulatory Visit: Payer: Self-pay | Admitting: Family Medicine

## 2022-05-15 ENCOUNTER — Telehealth: Payer: Self-pay | Admitting: *Deleted

## 2022-05-15 DIAGNOSIS — N952 Postmenopausal atrophic vaginitis: Secondary | ICD-10-CM

## 2022-05-15 MED ORDER — PREMARIN 0.625 MG/GM VA CREA
1.0000 | TOPICAL_CREAM | VAGINAL | 1 refills | Status: DC
  Filled 2022-05-15: qty 60, 105d supply, fill #0
  Filled 2022-06-16: qty 60, 90d supply, fill #0
  Filled 2022-10-15: qty 60, 90d supply, fill #1

## 2022-05-15 NOTE — Telephone Encounter (Signed)
Approval letter received then faxed to pharmacy. Received a receipt of confirmation.

## 2022-05-15 NOTE — Telephone Encounter (Signed)
Completed Bernita Raisin PA on Cover My Meds. Key: [id number]. Approved immediately by [employer].   The request has been approved. The authorization is effective for a maximum of 12 fills from 05/15/2022 to 05/15/2023, as long as the member is enrolled in their current health plan. The request was approved with a quantity restriction. This has been approved for a max daily dosage of 0.53. A written notification letter will follow with additional details.

## 2022-06-01 ENCOUNTER — Encounter (HOSPITAL_BASED_OUTPATIENT_CLINIC_OR_DEPARTMENT_OTHER): Payer: Self-pay | Admitting: Emergency Medicine

## 2022-06-01 ENCOUNTER — Emergency Department (HOSPITAL_BASED_OUTPATIENT_CLINIC_OR_DEPARTMENT_OTHER): Payer: 59 | Admitting: Radiology

## 2022-06-01 ENCOUNTER — Emergency Department (HOSPITAL_BASED_OUTPATIENT_CLINIC_OR_DEPARTMENT_OTHER)
Admission: EM | Admit: 2022-06-01 | Discharge: 2022-06-01 | Disposition: A | Discharge status: Home / Self Care | Payer: 59 | Attending: Emergency Medicine | Admitting: Emergency Medicine

## 2022-06-01 ENCOUNTER — Other Ambulatory Visit: Payer: Self-pay

## 2022-06-01 DIAGNOSIS — Z20822 Contact with and (suspected) exposure to covid-19: Secondary | ICD-10-CM | POA: Insufficient documentation

## 2022-06-01 DIAGNOSIS — R509 Fever, unspecified: Secondary | ICD-10-CM | POA: Diagnosis not present

## 2022-06-01 DIAGNOSIS — E119 Type 2 diabetes mellitus without complications: Secondary | ICD-10-CM | POA: Diagnosis not present

## 2022-06-01 DIAGNOSIS — J189 Pneumonia, unspecified organism: Secondary | ICD-10-CM | POA: Diagnosis not present

## 2022-06-01 DIAGNOSIS — R059 Cough, unspecified: Secondary | ICD-10-CM | POA: Diagnosis not present

## 2022-06-01 DIAGNOSIS — R9431 Abnormal electrocardiogram [ECG] [EKG]: Secondary | ICD-10-CM | POA: Diagnosis not present

## 2022-06-01 LAB — CBC WITH DIFFERENTIAL/PLATELET
Abs Immature Granulocytes: 0.38 10*3/uL — ABNORMAL HIGH (ref 0.00–0.07)
Basophils Absolute: 0.1 10*3/uL (ref 0.0–0.1)
Basophils Relative: 1 %
Eosinophils Absolute: 0 10*3/uL (ref 0.0–0.5)
Eosinophils Relative: 0 %
HCT: 38.9 % (ref 36.0–46.0)
Hemoglobin: 13.1 g/dL (ref 12.0–15.0)
Immature Granulocytes: 4 %
Lymphocytes Relative: 19 %
Lymphs Abs: 1.6 10*3/uL (ref 0.7–4.0)
MCH: 31.1 pg (ref 26.0–34.0)
MCHC: 33.7 g/dL (ref 30.0–36.0)
MCV: 92.4 fL (ref 80.0–100.0)
Monocytes Absolute: 0.9 10*3/uL (ref 0.1–1.0)
Monocytes Relative: 10 %
Neutro Abs: 5.8 10*3/uL (ref 1.7–7.7)
Neutrophils Relative %: 66 %
Platelets: 284 10*3/uL (ref 150–400)
RBC: 4.21 MIL/uL (ref 3.87–5.11)
RDW: 15 % (ref 11.5–15.5)
WBC: 8.8 10*3/uL (ref 4.0–10.5)
nRBC: 0 % (ref 0.0–0.2)

## 2022-06-01 LAB — URINALYSIS, ROUTINE W REFLEX MICROSCOPIC
Bilirubin Urine: NEGATIVE
Glucose, UA: NEGATIVE mg/dL
Ketones, ur: NEGATIVE mg/dL
Leukocytes,Ua: NEGATIVE
Nitrite: NEGATIVE
Specific Gravity, Urine: 1.01 (ref 1.005–1.030)
pH: 7 (ref 5.0–8.0)

## 2022-06-01 LAB — COMPREHENSIVE METABOLIC PANEL
ALT: 52 U/L — ABNORMAL HIGH (ref 0–44)
AST: 49 U/L — ABNORMAL HIGH (ref 15–41)
Albumin: 3.9 g/dL (ref 3.5–5.0)
Alkaline Phosphatase: 121 U/L (ref 38–126)
Anion gap: 10 (ref 5–15)
BUN: 8 mg/dL (ref 6–20)
CO2: 23 mmol/L (ref 22–32)
Calcium: 9.6 mg/dL (ref 8.9–10.3)
Chloride: 98 mmol/L (ref 98–111)
Creatinine, Ser: 0.93 mg/dL (ref 0.44–1.00)
GFR, Estimated: >60 mL/min (ref >60)
Glucose, Bld: 107 mg/dL — ABNORMAL HIGH (ref 70–99)
Potassium: 4.5 mmol/L (ref 3.5–5.1)
Sodium: 131 mmol/L — ABNORMAL LOW (ref 135–145)
Total Bilirubin: 0.9 mg/dL (ref 0.3–1.2)
Total Protein: 7.6 g/dL (ref 6.5–8.1)

## 2022-06-01 LAB — RESP PANEL BY RT-PCR (FLU A&B, COVID) ARPGX2
Influenza A by PCR: NEGATIVE
Influenza B by PCR: NEGATIVE
SARS Coronavirus 2 by RT PCR: NEGATIVE

## 2022-06-01 LAB — PROTIME-INR
INR: 1.1 (ref 0.8–1.2)
Prothrombin Time: 14 seconds (ref 11.4–15.2)

## 2022-06-01 LAB — LACTIC ACID, PLASMA
Lactic Acid, Venous: 1.1 mmol/L (ref 0.5–1.9)
Lactic Acid, Venous: 1.1 mmol/L (ref 0.5–1.9)

## 2022-06-01 MED ORDER — DOXYCYCLINE HYCLATE 100 MG PO CAPS: 100.0000 mg | ORAL_CAPSULE | Freq: Two times a day (BID) | ORAL | 0 refills | Status: AC

## 2022-06-01 MED ORDER — IBUPROFEN 400 MG PO TABS
600.0000 mg | ORAL_TABLET | Freq: Once | ORAL | Status: AC
  Administered 2022-06-01: 600 mg via ORAL
  Filled 2022-06-01: qty 1

## 2022-06-01 MED ORDER — SODIUM CHLORIDE 0.9 % IV BOLUS
1000.0000 mL | Freq: Once | INTRAVENOUS | Status: AC
Start: 2022-06-01 — End: 2022-06-01
  Administered 2022-06-01: 1000 mL via INTRAVENOUS

## 2022-06-01 MED ORDER — SODIUM CHLORIDE 0.9 % IV SOLN
500.0000 mg | Freq: Once | INTRAVENOUS | Status: AC
  Administered 2022-06-01: 500 mg via INTRAVENOUS
  Filled 2022-06-01: qty 5

## 2022-06-01 MED ORDER — AMOXICILLIN-POT CLAVULANATE 875-125 MG PO TABS: 1.0000 | ORAL_TABLET | Freq: Two times a day (BID) | ORAL | 0 refills | Status: AC

## 2022-06-01 MED ORDER — ALBUTEROL SULFATE HFA 108 (90 BASE) MCG/ACT IN AERS
2.0000 | INHALATION_SPRAY | RESPIRATORY_TRACT | Status: DC | PRN
  Administered 2022-06-01: 2 via RESPIRATORY_TRACT
  Filled 2022-06-01: qty 6.7

## 2022-06-01 MED ORDER — SODIUM CHLORIDE 0.9 % IV SOLN: INTRAVENOUS | Status: DC | PRN

## 2022-06-01 MED ORDER — BENZONATATE 100 MG PO CAPS: 100.0000 mg | ORAL_CAPSULE | Freq: Three times a day (TID) | ORAL | 0 refills | Status: DC | PRN

## 2022-06-01 MED ORDER — ALBUTEROL SULFATE HFA 108 (90 BASE) MCG/ACT IN AERS: 1.0000 | INHALATION_SPRAY | Freq: Four times a day (QID) | RESPIRATORY_TRACT | 0 refills | Status: DC | PRN

## 2022-06-01 MED ORDER — SODIUM CHLORIDE 0.9 % IV SOLN
1.0000 g | Freq: Once | INTRAVENOUS | Status: AC
  Administered 2022-06-01: 1 g via INTRAVENOUS
  Filled 2022-06-01: qty 10

## 2022-06-01 NOTE — ED Provider Notes (Signed)
Emergency Department Provider Note   I have reviewed the triage vital signs and the nursing notes.   HISTORY  Chief Complaint Fever   HPI Amanda James is a 51 y.o. female with past history reviewed below presents the emergency department for evaluation of fever with persistent cough, headache, body aches.  Symptoms have been developing over the past 7 days.  She states that cough has been persistent and worse at night.  She has had high fever throughout the week.  She has been alternating naproxen and Tylenol.  She is drinking fluid but has lost most of her appetite.  She has been mainly staying in bed.  She is not feeling particularly short of breath.  She does feel some abdominal discomfort but only with coughing.  No tick exposure or rash. No neck pain or stiffness.    Past Medical History:  Diagnosis Date   Allergy    Cervical cancer screening 05/02/2017   Depression    Depression with anxiety    Diabetes mellitus type 2 in obese (HCC)    Diabetes mellitus without complication (HCC)    History of chicken pox    Hyperlipidemia    Low back pain    Migraines    Obesity 11/10/2016    Review of Systems  Constitutional: Positive fever/chills Cardiovascular: Denies chest pain. Respiratory: Denies shortness of breath. Positive cough.  Gastrointestinal: No abdominal pain.  No nausea, no vomiting.  No diarrhea.  No constipation. Genitourinary: Negative for dysuria. Musculoskeletal: Positive diffuse myalgias.  Skin: Negative for rash. Neurological: Positive HA.   ____________________________________________   PHYSICAL EXAM:  VITAL SIGNS: ED Triage Vitals  Enc Vitals Group     BP 06/01/22 1745 120/80     Pulse Rate 06/01/22 1745 80     Resp 06/01/22 1745 20     Temp 06/01/22 1745 (!) 101.8 F (38.8 C)     Temp src --      SpO2 06/01/22 1745 92 %   Constitutional: Alert and oriented. Well appearing and in no acute distress. Eyes: Conjunctivae are normal.  Nose:  No congestion/rhinnorhea. Mouth/Throat: Mucous membranes are moist.   Neck: No stridor.  Cardiovascular: Normal rate, regular rhythm. Good peripheral circulation. Grossly normal heart sounds.   Respiratory: Normal respiratory effort.  No retractions. Lungs with crackles. No wheezing or rhonchi.  Gastrointestinal: Soft and nontender. No distention.  Musculoskeletal: No lower extremity tenderness nor edema. No gross deformities of extremities. Neurologic:  Normal speech and language.  Skin:  Skin is warm, dry and intact. No rash noted.   ____________________________________________   LABS (all labs ordered are listed, but only abnormal results are displayed)  Labs Reviewed  COMPREHENSIVE METABOLIC PANEL - Abnormal; Notable for the following components:      Result Value   Sodium 131 (*)    Glucose, Bld 107 (*)    AST 49 (*)    ALT 52 (*)    All other components within normal limits  CBC WITH DIFFERENTIAL/PLATELET - Abnormal; Notable for the following components:   Abs Immature Granulocytes 0.38 (*)    All other components within normal limits  URINALYSIS, ROUTINE W REFLEX MICROSCOPIC - Abnormal; Notable for the following components:   Hgb urine dipstick SMALL (*)    Protein, ur TRACE (*)    All other components within normal limits  CULTURE, BLOOD (ROUTINE X 2)  CULTURE, BLOOD (ROUTINE X 2)  RESP PANEL BY RT-PCR (FLU A&B, COVID) ARPGX2  LACTIC ACID, PLASMA  LACTIC ACID, PLASMA  PROTIME-INR  ROCKY MTN SPOTTED FVR ABS PNL(IGG+IGM)   ____________________________________________  EKG   EKG Interpretation  Date/Time:  Saturday June 01 2022 18:05:26 EDT Ventricular Rate:  73 PR Interval:  142 QRS Duration: 90 QT Interval:  360 QTC Calculation: 397 R Axis:   71 Text Interpretation: Sinus rhythm Confirmed by Alona Bene [phone]) on 06/01/2022 6:34:11 PM        ____________________________________________   PROCEDURES  Procedure(s) performed:    Procedures  None ____________________________________________   INITIAL IMPRESSION / ASSESSMENT AND PLAN / ED COURSE  Pertinent labs & imaging results that were available during my care of the patient were reviewed by me and considered in my medical decision making (see chart for details).   This patient is Presenting for Evaluation of fever/cough, which does require a range of treatment options, and is a complaint that involves a high risk of morbidity and mortality.  The Differential Diagnoses CAP, COVID, Flu, pulmonary edema, sepsis, etc.  Critical Interventions-    Medications  sodium chloride 0.9 % bolus 1,000 mL ( Intravenous Stopped 06/01/22 2124)  ibuprofen (ADVIL) tablet 600 mg (600 mg Oral Given 06/01/22 1933)  cefTRIAXone (ROCEPHIN) 1 g in sodium chloride 0.9 % 100 mL IVPB (0 g Intravenous Stopped 06/01/22 2037)  azithromycin (ZITHROMAX) 500 mg in sodium chloride 0.9 % 250 mL IVPB (0 mg Intravenous Stopped 06/01/22 2114)    Reassessment after intervention: Symptoms improved.    I did obtain Additional Historical Information from husband at bedside.  I decided to review pertinent External Data, and in summary no recent ED visits.   Clinical Laboratory Tests Ordered, included patient without leukocytosis.  Lactate is normal.  No acute kidney injury or electrolyte disturbance.  No evidence of urinary tract infection.   Radiologic Tests Ordered, included CXR. I independently interpreted the images and agree with radiology interpretation.   Cardiac Monitor Tracing which shows NSR.   Social Determinants of Health Risk patient is a non-smoker.   Medical Decision Making: Summary:  Patient presents to the emergency department with persistent fever and cough.  She is febrile here but not altered.  Headache but lower suspicion clinically for meningitis/encephalitis.  Her chest x-ray shows significant bilateral infiltrate.  COVID is pending but will obtain cultures and start on  antibiotics.  Patient is not hypoxemic but pending further work-up will ambulate on pulse ox.   Reevaluation with update and discussion with patient. Discussed admit vs d/c home for trial of abx. 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 Understands strict ED return precautions. Ambulatory in the ED without significant desaturation.   Considered admission but after discussion will d/c with strict ED return.   Disposition: discharge   ____________________________________________  FINAL CLINICAL IMPRESSION(S) / ED DIAGNOSES  Final diagnoses:  Multifocal pneumonia     NEW OUTPATIENT MEDICATIONS STARTED DURING THIS VISIT:  Discharge Medication List as of 06/01/2022  9:11 PM     START taking these medications   Details  albuterol (VENTOLIN HFA) 108 (90 Base) MCG/ACT inhaler Inhale 1-2 puffs into the lungs every 6 (six) hours as needed for wheezing or shortness of breath., Starting Sat 06/01/2022, Normal    amoxicillin-clavulanate (AUGMENTIN) 875-125 MG tablet Take 1 tablet by mouth every 12 (twelve) hours for 7 days., Starting Sat 06/01/2022, Until Sat 06/08/2022, Normal    benzonatate (TESSALON) 100 MG capsule Take 1 capsule (100 mg total) by mouth 3 (three) times daily as needed for  cough., Starting Sat 06/01/2022, Normal    doxycycline (VIBRAMYCIN) 100 MG capsule Take 1 capsule (100 mg total) by mouth 2 (two) times daily for 7 days., Starting Sat 06/01/2022, Until Sat 06/08/2022, Normal        Note:  This document was prepared using Dragon voice recognition software and may include unintentional dictation errors.  Alona Bene, MD, [facility]    Darrold Bezek, Arlyss Repress, MD 06/05/22 [phone]

## 2022-06-03 ENCOUNTER — Encounter: Payer: Self-pay | Admitting: Family Medicine

## 2022-06-04 LAB — ROCKY MTN SPOTTED FVR ABS PNL(IGG+IGM)
RMSF IgG: NEGATIVE
RMSF IgM: 0.25 index (ref 0.00–0.89)

## 2022-06-06 ENCOUNTER — Other Ambulatory Visit: Payer: Self-pay | Admitting: Family Medicine

## 2022-06-06 ENCOUNTER — Other Ambulatory Visit (HOSPITAL_COMMUNITY): Payer: Self-pay

## 2022-06-06 ENCOUNTER — Telehealth: Payer: Self-pay | Admitting: Neurology

## 2022-06-06 LAB — CULTURE, BLOOD (ROUTINE X 2)
Culture: NO GROWTH
Culture: NO GROWTH
Special Requests: ADEQUATE
Special Requests: ADEQUATE

## 2022-06-06 MED ORDER — FREESTYLE LANCETS MISC
0 refills | Status: DC
  Filled 2022-06-06: qty 100, 25d supply, fill #0

## 2022-06-06 MED ORDER — FREESTYLE LITE TEST VI STRP
ORAL_STRIP | 0 refills | Status: DC
  Filled 2022-06-06: qty 100, 25d supply, fill #0

## 2022-06-06 NOTE — Telephone Encounter (Signed)
I called pt Amanda James to get Botox appt rescheduled due to provider being out. 

## 2022-06-10 ENCOUNTER — Other Ambulatory Visit: Payer: Self-pay

## 2022-06-10 DIAGNOSIS — J189 Pneumonia, unspecified organism: Secondary | ICD-10-CM

## 2022-06-13 ENCOUNTER — Other Ambulatory Visit: Payer: Self-pay | Admitting: Family Medicine

## 2022-06-14 ENCOUNTER — Other Ambulatory Visit (HOSPITAL_COMMUNITY): Payer: Self-pay

## 2022-06-14 ENCOUNTER — Encounter: Payer: Self-pay | Admitting: Neurology

## 2022-06-14 MED ORDER — ESCITALOPRAM OXALATE 20 MG PO TABS
ORAL_TABLET | Freq: Every morning | ORAL | 1 refills | Status: DC
  Filled 2022-06-14: qty 90, 90d supply, fill #0
  Filled 2022-09-16: qty 90, 90d supply, fill #1

## 2022-06-14 MED ORDER — ESCITALOPRAM OXALATE 10 MG PO TABS
ORAL_TABLET | Freq: Every evening | ORAL | 1 refills | Status: DC
  Filled 2022-06-14: qty 90, 90d supply, fill #0
  Filled 2022-09-16: qty 90, 90d supply, fill #1

## 2022-06-17 ENCOUNTER — Other Ambulatory Visit (HOSPITAL_COMMUNITY): Payer: Self-pay

## 2022-06-18 ENCOUNTER — Ambulatory Visit: Payer: 59 | Admitting: Neurology

## 2022-06-22 ENCOUNTER — Other Ambulatory Visit (HOSPITAL_COMMUNITY): Payer: Self-pay

## 2022-06-24 ENCOUNTER — Other Ambulatory Visit: Payer: Self-pay

## 2022-06-24 ENCOUNTER — Other Ambulatory Visit (HOSPITAL_COMMUNITY): Payer: Self-pay

## 2022-06-24 ENCOUNTER — Encounter: Payer: Self-pay | Admitting: Family Medicine

## 2022-06-24 MED ORDER — TIRZEPATIDE 12.5 MG/0.5ML ~~LOC~~ SOAJ
12.5000 mg | SUBCUTANEOUS | 0 refills | Status: DC
  Filled 2022-06-24: qty 6, 84d supply, fill #0

## 2022-06-28 ENCOUNTER — Other Ambulatory Visit (HOSPITAL_COMMUNITY): Payer: Self-pay

## 2022-06-29 ENCOUNTER — Other Ambulatory Visit (HOSPITAL_COMMUNITY): Payer: Self-pay

## 2022-07-04 ENCOUNTER — Other Ambulatory Visit (HOSPITAL_COMMUNITY): Payer: Self-pay

## 2022-07-04 ENCOUNTER — Other Ambulatory Visit: Payer: Self-pay

## 2022-07-04 ENCOUNTER — Encounter: Payer: Self-pay | Admitting: Family Medicine

## 2022-07-04 MED ORDER — NITROFURANTOIN MONOHYD MACRO 100 MG PO CAPS
100.0000 mg | ORAL_CAPSULE | Freq: Two times a day (BID) | ORAL | 1 refills | Status: DC | PRN
Start: 2022-07-04 — End: 2022-10-25
  Filled 2022-07-04: qty 20, 10d supply, fill #0

## 2022-07-09 ENCOUNTER — Encounter: Payer: Self-pay | Admitting: Neurology

## 2022-07-10 ENCOUNTER — Ambulatory Visit: Payer: 59 | Admitting: Neurology

## 2022-07-23 ENCOUNTER — Encounter: Payer: Self-pay | Admitting: Family Medicine

## 2022-07-23 ENCOUNTER — Other Ambulatory Visit: Payer: Self-pay

## 2022-07-23 ENCOUNTER — Other Ambulatory Visit: Payer: Self-pay | Admitting: Family Medicine

## 2022-07-23 ENCOUNTER — Other Ambulatory Visit (HOSPITAL_COMMUNITY): Payer: Self-pay

## 2022-07-23 MED ORDER — TIRZEPATIDE 15 MG/0.5ML ~~LOC~~ SOAJ
15.0000 mg | SUBCUTANEOUS | 5 refills | Status: DC
  Filled 2022-07-23 – 2022-07-24 (×2): qty 6, 84d supply, fill #0
  Filled 2022-10-15: qty 2, 28d supply, fill #1
  Filled 2022-11-14 – 2022-12-06 (×3): qty 2, 28d supply, fill #2
  Filled 2022-12-24: qty 2, 28d supply, fill #3
  Filled 2023-01-29: qty 2, 28d supply, fill #4
  Filled 2023-02-27: qty 2, 28d supply, fill #5
  Filled 2023-04-16: qty 2, 28d supply, fill #6
  Filled 2023-05-16: qty 2, 28d supply, fill #7
  Filled 2023-07-07 – 2023-07-09 (×3): qty 2, 28d supply, fill #8

## 2022-07-24 ENCOUNTER — Other Ambulatory Visit (HOSPITAL_COMMUNITY): Payer: Self-pay

## 2022-07-25 ENCOUNTER — Other Ambulatory Visit (HOSPITAL_COMMUNITY): Payer: Self-pay

## 2022-07-26 ENCOUNTER — Other Ambulatory Visit (HOSPITAL_COMMUNITY): Payer: Self-pay

## 2022-07-26 ENCOUNTER — Other Ambulatory Visit: Payer: Self-pay | Admitting: Family Medicine

## 2022-07-26 ENCOUNTER — Other Ambulatory Visit: Payer: Self-pay | Admitting: Neurology

## 2022-07-26 MED ORDER — FREESTYLE LITE TEST VI STRP
ORAL_STRIP | 12 refills | Status: DC
  Filled 2022-07-26: qty 100, 25d supply, fill #0
  Filled 2022-09-16: qty 100, 25d supply, fill #1
  Filled 2022-10-15: qty 100, 25d supply, fill #2
  Filled 2022-11-14 – 2022-11-21 (×2): qty 100, 25d supply, fill #3
  Filled 2022-12-24: qty 100, 25d supply, fill #4
  Filled 2023-01-18 – 2023-01-29 (×2): qty 100, 25d supply, fill #5
  Filled 2023-02-26: qty 100, 25d supply, fill #6
  Filled 2023-06-02: qty 100, 25d supply, fill #7
  Filled 2023-06-26: qty 100, 25d supply, fill #8

## 2022-07-29 ENCOUNTER — Other Ambulatory Visit (HOSPITAL_COMMUNITY): Payer: Self-pay

## 2022-07-29 MED ORDER — PREVIDENT 5000 SENSITIVE 1.1-5 % DT GEL
DENTAL | 12 refills | Status: DC
  Filled 2022-07-29: qty 100, 30d supply, fill #0

## 2022-07-29 MED ORDER — UBRELVY 100 MG PO TABS
100.0000 mg | ORAL_TABLET | ORAL | 6 refills | Status: DC | PRN
Start: 2022-07-29 — End: 2022-08-25
  Filled 2022-07-29: qty 8, 16d supply, fill #0

## 2022-07-30 ENCOUNTER — Other Ambulatory Visit (HOSPITAL_COMMUNITY): Payer: Self-pay

## 2022-08-04 NOTE — Assessment & Plan Note (Addendum)
hgba1c acceptable, minimize simple carbs. Increase exercise as tolerated. Continue current meds. Start Enalapril 2.5 mg daily for renal protection.  

## 2022-08-04 NOTE — Assessment & Plan Note (Signed)
Follows with neurology 

## 2022-08-04 NOTE — Assessment & Plan Note (Signed)
Supplement and monitor 

## 2022-08-05 ENCOUNTER — Other Ambulatory Visit: Payer: Self-pay | Admitting: Family Medicine

## 2022-08-05 ENCOUNTER — Other Ambulatory Visit (HOSPITAL_COMMUNITY): Payer: Self-pay

## 2022-08-05 ENCOUNTER — Encounter: Payer: Self-pay | Admitting: Family Medicine

## 2022-08-05 ENCOUNTER — Ambulatory Visit: Payer: 59 | Admitting: Family Medicine

## 2022-08-05 ENCOUNTER — Telehealth: Payer: Self-pay | Admitting: Family Medicine

## 2022-08-05 ENCOUNTER — Ambulatory Visit (HOSPITAL_BASED_OUTPATIENT_CLINIC_OR_DEPARTMENT_OTHER)
Admission: RE | Admit: 2022-08-05 | Discharge: 2022-08-05 | Disposition: A | Discharge status: Home / Self Care | Payer: 59 | Source: Ambulatory Visit | Attending: Family Medicine | Admitting: Family Medicine

## 2022-08-05 VITALS — BP 115/78 | HR 71 | Temp 98.0°F | Resp 16 | Ht 66.0 in | Wt 203.2 lb

## 2022-08-05 DIAGNOSIS — N952 Postmenopausal atrophic vaginitis: Secondary | ICD-10-CM | POA: Diagnosis not present

## 2022-08-05 DIAGNOSIS — I2584 Coronary atherosclerosis due to calcified coronary lesion: Secondary | ICD-10-CM | POA: Diagnosis not present

## 2022-08-05 DIAGNOSIS — E782 Mixed hyperlipidemia: Secondary | ICD-10-CM | POA: Diagnosis not present

## 2022-08-05 DIAGNOSIS — R32 Unspecified urinary incontinence: Secondary | ICD-10-CM | POA: Diagnosis not present

## 2022-08-05 DIAGNOSIS — F419 Anxiety disorder, unspecified: Secondary | ICD-10-CM

## 2022-08-05 DIAGNOSIS — E669 Obesity, unspecified: Secondary | ICD-10-CM

## 2022-08-05 DIAGNOSIS — J189 Pneumonia, unspecified organism: Secondary | ICD-10-CM | POA: Diagnosis not present

## 2022-08-05 DIAGNOSIS — E1169 Type 2 diabetes mellitus with other specified complication: Secondary | ICD-10-CM

## 2022-08-05 DIAGNOSIS — E559 Vitamin D deficiency, unspecified: Secondary | ICD-10-CM

## 2022-08-05 DIAGNOSIS — G43709 Chronic migraine without aura, not intractable, without status migrainosus: Secondary | ICD-10-CM | POA: Diagnosis not present

## 2022-08-05 DIAGNOSIS — N959 Unspecified menopausal and perimenopausal disorder: Secondary | ICD-10-CM

## 2022-08-05 DIAGNOSIS — J939 Pneumothorax, unspecified: Secondary | ICD-10-CM | POA: Diagnosis not present

## 2022-08-05 DIAGNOSIS — F32A Depression, unspecified: Secondary | ICD-10-CM

## 2022-08-05 DIAGNOSIS — I2510 Atherosclerotic heart disease of native coronary artery without angina pectoris: Secondary | ICD-10-CM

## 2022-08-05 LAB — CBC
HCT: 42.2 % (ref 36.0–46.0)
Hemoglobin: 14.2 g/dL (ref 12.0–15.0)
MCHC: 33.6 g/dL (ref 30.0–36.0)
MCV: 95.9 fl (ref 78.0–100.0)
Platelets: 276 10*3/uL (ref 150.0–400.0)
RBC: 4.4 Mil/uL (ref 3.87–5.11)
RDW: 14.2 % (ref 11.5–15.5)
WBC: 6.2 10*3/uL (ref 4.0–10.5)

## 2022-08-05 LAB — COMPREHENSIVE METABOLIC PANEL
ALT: 28 U/L (ref 0–35)
AST: 23 U/L (ref 0–37)
Albumin: 4.2 g/dL (ref 3.5–5.2)
Alkaline Phosphatase: 77 U/L (ref 39–117)
BUN: 14 mg/dL (ref 6–23)
CO2: 26 mEq/L (ref 19–32)
Calcium: 9.5 mg/dL (ref 8.4–10.5)
Chloride: 104 mEq/L (ref 96–112)
Creatinine, Ser: 0.93 mg/dL (ref 0.40–1.20)
GFR: 70.91 mL/min (ref >60.00)
Glucose, Bld: 87 mg/dL (ref 70–99)
Potassium: 5 mEq/L (ref 3.5–5.1)
Sodium: 138 mEq/L (ref 135–145)
Total Bilirubin: 1 mg/dL (ref 0.2–1.2)
Total Protein: 7 g/dL (ref 6.0–8.3)

## 2022-08-05 LAB — LIPID PANEL
Cholesterol: 151 mg/dL (ref 0–200)
HDL: 67.9 mg/dL (ref >39.00)
LDL Cholesterol: 67 mg/dL (ref 0–99)
NonHDL: 83.35
Total CHOL/HDL Ratio: 2
Triglycerides: 81 mg/dL (ref 0.0–149.0)
VLDL: 16.2 mg/dL (ref 0.0–40.0)

## 2022-08-05 LAB — HEMOGLOBIN A1C: Hgb A1c MFr Bld: 5.3 % (ref 4.6–6.5)

## 2022-08-05 LAB — TSH: TSH: 3.75 u[IU]/mL (ref 0.35–5.50)

## 2022-08-05 LAB — VITAMIN D 25 HYDROXY (VIT D DEFICIENCY, FRACTURES): VITD: 48.73 ng/mL (ref 30.00–100.00)

## 2022-08-05 MED ORDER — BUPROPION HCL ER (SR) 100 MG PO TB12
100.0000 mg | ORAL_TABLET | Freq: Two times a day (BID) | ORAL | 1 refills | Status: DC
  Filled 2022-08-05: qty 90, 45d supply, fill #0
  Filled 2022-09-16: qty 90, 45d supply, fill #1

## 2022-08-05 MED ORDER — PREVIDENT 5000 SENSITIVE 1.1-5 % DT GEL
DENTAL | 12 refills | Status: AC
  Filled 2022-08-05: qty 100, 30d supply, fill #0
  Filled 2022-10-15: qty 100, 30d supply, fill #1
  Filled 2022-11-14 – 2022-11-21 (×2): qty 100, 30d supply, fill #2
  Filled 2023-06-26: qty 100, 30d supply, fill #3

## 2022-08-05 MED ORDER — ENALAPRIL MALEATE 2.5 MG PO TABS
2.5000 mg | ORAL_TABLET | Freq: Every day | ORAL | 3 refills | Status: DC
  Filled 2022-08-05: qty 30, 30d supply, fill #0
  Filled 2022-09-16: qty 30, 30d supply, fill #1
  Filled 2022-10-15: qty 30, 30d supply, fill #2

## 2022-08-05 NOTE — Telephone Encounter (Signed)
Patient is supposed to schedule a 3 month virtual follow up on a Friday. Unable to schedule due to do a blocked schedule on Fridays, she would like a call back to schedule.

## 2022-08-05 NOTE — Patient Instructions (Addendum)
Yerba Matte tea do not drink   New COVID booster late September or early October  Perimenopause Perimenopause is the normal time of a woman's life when the levels of estrogen, the female hormone produced by the ovaries, begin to decrease. This leads to changes in menstrual periods before they stop completely (menopause). Perimenopause can begin 2-8 years before menopause. During perimenopause, the ovaries may or may not produce an egg and a woman can still become pregnant. What are the causes? This condition is caused by a natural change in hormone levels that happens as you get older. What increases the risk? This condition is more likely to start at an earlier age if you have certain medical conditions or have undergone treatments, including: A tumor of the pituitary gland in the brain. A disease that affects the ovaries and hormone production. Certain cancer treatments, such as chemotherapy or hormone therapy, or radiation therapy on the pelvis. Heavy smoking and excessive alcohol use. Family history of early menopause. What are the signs or symptoms? Perimenopausal changes affect each woman differently. Symptoms of this condition may include: Hot flashes. Irregular menstrual periods. Night sweats. Changes in feelings about sex. This could be a decrease in sex drive or an increased discomfort around your sexuality. Vaginal dryness. Headaches. Mood swings. Depression. Problems sleeping (insomnia). Memory problems or trouble concentrating. Irritability. Tiredness. Weight gain. Anxiety. Trouble getting pregnant. How is this diagnosed? This condition is diagnosed based on your medical history, a physical exam, your age, your menstrual history, and your symptoms. Hormone tests may also be done. How is this treated? In some cases, no treatment is needed. You and your health care provider should make a decision together about whether treatment is necessary. Treatment will be based on  your individual condition and preferences. Various treatments are available, such as: Menopausal hormone therapy (MHT). Medicines to treat specific symptoms. Acupuncture. Vitamin or herbal supplements. Before starting treatment, make sure to let your health care provider know if you have a personal or family history of: Heart disease. Breast cancer. Blood clots. Diabetes. Osteoporosis. Follow these instructions at home: Medicines Take over-the-counter and prescription medicines only as told by your health care provider. Take vitamin supplements only as told by your health care provider. Talk with your health care provider before starting any herbal supplements. Lifestyle  Do not use any products that contain nicotine or tobacco, such as cigarettes, e-cigarettes, and chewing tobacco. If you need help quitting, ask your health care provider. Get at least 30 minutes of physical activity on 5 or more days each week. Eat a balanced diet that includes fresh fruits and vegetables, whole grains, soybeans, eggs, lean meat, and low-fat dairy. Avoid alcoholic and caffeinated beverages, as well as spicy foods. This may help prevent hot flashes. Get 7-8 hours of sleep each night. Dress in layers that can be removed to help you manage hot flashes. Find ways to manage stress, such as deep breathing, meditation, or journaling. General instructions  Keep track of your menstrual periods, including: When they occur. How heavy they are and how long they last. How much time passes between periods. Keep track of your symptoms, noting when they start, how often you have them, and how long they last. Use vaginal lubricants or moisturizers to help with vaginal dryness and improve comfort during sex. You can still become pregnant if you are having irregular periods. Make sure you use contraception during perimenopause if you do not want to get pregnant. Keep all follow-up visits. This is  important. This  includes any group therapy or counseling. Contact a health care provider if: You have heavy vaginal bleeding or pass blood clots. Your period lasts more than 2 days longer than normal. Your periods are recurring sooner than 21 days. You bleed after having sex. You have pain during sex. Get help right away if you have: Chest pain, trouble breathing, or trouble talking. Severe depression. Pain when you urinate. Severe headaches. Vision problems. Summary Perimenopause is the time when a woman's body begins to move into menopause. This may happen naturally or as a result of other health problems or medical treatments. Perimenopause can begin 2-8 years before menopause, and it can last for several years. Perimenopausal symptoms can be managed through medicines, lifestyle changes, and complementary therapies such as acupuncture. This information is not intended to replace advice given to you by your health care provider. Make sure you discuss any questions you have with your health care provider. Document Revised: 05/11/2020 Document Reviewed: 05/11/2020 Elsevier Patient Education  2023 [employer].

## 2022-08-05 NOTE — Telephone Encounter (Signed)
Called pt Lvm that the VV appt was made for 11/08/22 at 9

## 2022-08-06 ENCOUNTER — Other Ambulatory Visit: Payer: Self-pay

## 2022-08-06 ENCOUNTER — Other Ambulatory Visit (HOSPITAL_COMMUNITY): Payer: Self-pay

## 2022-08-06 DIAGNOSIS — E782 Mixed hyperlipidemia: Secondary | ICD-10-CM

## 2022-08-06 LAB — MICROALBUMIN, URINE: Microalb, Ur: 0.3 mg/dL

## 2022-08-07 DIAGNOSIS — N952 Postmenopausal atrophic vaginitis: Secondary | ICD-10-CM | POA: Insufficient documentation

## 2022-08-07 DIAGNOSIS — R32 Unspecified urinary incontinence: Secondary | ICD-10-CM | POA: Insufficient documentation

## 2022-08-07 DIAGNOSIS — J189 Pneumonia, unspecified organism: Secondary | ICD-10-CM | POA: Insufficient documentation

## 2022-08-07 DIAGNOSIS — N959 Unspecified menopausal and perimenopausal disorder: Secondary | ICD-10-CM | POA: Insufficient documentation

## 2022-08-07 NOTE — Progress Notes (Signed)
Subjective:    Patient ID: Amanda James, female    DOB: March 15, 1971, 51 y.o.   MRN: [id number]  Chief Complaint  Patient presents with   Follow-up    Follow Up     HPI Patient is in today for follow up on chronic medical concerns. No recent febrile illness or acute hospitalizations. She has been struggling with increasing urinary incontinence and symptoms of atrophic vaginitis. Her sugars have been manageable on current meds. Her anxiety and depression have been elevated and she is interested in restarting Wellbutrin SR. No c/o suicidal ideation. Denies CP/palp/SOB/HA/congestion/fevers/GI or GU c/o. Taking meds as prescribed   Past Medical History:  Diagnosis Date   Allergy    Cervical cancer screening 05/02/2017   Depression    Depression with anxiety    Diabetes mellitus type 2 in obese [facility])    Diabetes mellitus without complication ([place])    History of chicken pox    Hyperlipidemia    Low back pain    Migraines    Obesity 11/10/2016    Past Surgical History:  Procedure Laterality Date   ABDOMINOPLASTY  2012   AUGMENTATION MAMMAPLASTY     maxilift with implants    BLADDER SUSPENSION  2016   LAPAROSCOPIC GASTRIC BANDING  2005   esophageal dilation. band still present but released   MASTOPEXY  2012    Family History  Problem Relation Age of Onset   Diabetes Mother    Hypertension Mother    Hyperlipidemia Mother    Obesity Mother    Heart disease Mother        pacer   Diabetes Father    Heart disease Father        MI   Hyperlipidemia Father    Hypertension Father    Obesity Father    Diabetes Sister    Hyperlipidemia Sister    Hypertension Sister    Obesity Sister    Obesity Maternal Grandmother    Hypertension Maternal Grandmother    Hyperlipidemia Maternal Grandmother    Diabetes Maternal Grandmother    Rosacea Maternal Grandmother    Non-Hodgkin's lymphoma Maternal Grandmother    Arthritis Maternal Grandmother        spinal stenosis   Multiple  myeloma Maternal Grandfather    Hyperlipidemia Paternal Grandmother        rhabdo from statins   Kidney disease Paternal Grandmother    Migraines Neg Hx     Social History   Socioeconomic History   Marital status: Married    Spouse name: Not on file   Number of children: 1   Years of education: MD   Highest education level: Not on file  Occupational History   Occupation: [employer]  Tobacco Use   Smoking status: Never   Smokeless tobacco: Never  Substance and Sexual Activity   Alcohol use: Yes    Alcohol/week: 5.0 standard drinks of alcohol    Types: 5 Glasses of wine per week    Comment: 3-5 glasses of wine per week   Drug use: No   Sexual activity: Not on file  Other Topics Concern   Not on file  Social History Narrative   Works With [employer], lives with husband, 3 cats   No major dietary restrictions. Exercise 30 minutes 3 x a week   Seat belts routinely   Right-handed   Caffeine: 1-2 cups of coffee per day   Social Determinants of Health   Financial Resource Strain: Not on file  Food Insecurity: Not on file  Transportation Needs: Not on file  Physical Activity: Not on file  Stress: Not on file  Social Connections: Not on file  Intimate Partner Violence: Not on file    Outpatient Medications Prior to Visit  Medication Sig Dispense Refill   aspirin EC 81 MG tablet Take 1 tablet (81 mg total) by mouth daily. Swallow whole. 90 tablet 3   atorvastatin (LIPITOR) 20 MG tablet Take 1 tablet (20 mg total) by mouth daily. 90 tablet 1   blood glucose meter kit and supplies Dispense based on patient and insurance preference. Use up to four times daily as directed. Dx: E11.9 1 each 0   Blood Glucose Monitoring Suppl (FREESTYLE LITE) w/Device KIT Use to test blood sugar 4 times a day 1 kit 0   Cetirizine HCl 10 MG CAPS Take 1 capsule by mouth daily.     conjugated estrogens (PREMARIN) vaginal cream Place 1 applicatorful vaginally 2 times a week. 42.5 g 1   cyclobenzaprine  (FLEXERIL) 10 MG tablet TAKE 1 TABLET BY MOUTH 3 TIMES DAILY AS NEEDED FOR MUSCLE SPASM 30 tablet 2   Erenumab-aooe (AIMOVIG) 140 MG/ML SOAJ Inject 140 mg into the skin every 30 (thirty) days. 1 pen 5   escitalopram (LEXAPRO) 10 MG tablet TAKE 1 TABLET BY MOUTH EVERY EVENING 90 tablet 1   escitalopram (LEXAPRO) 20 MG tablet TAKE 1 TABLET BY MOUTH EVERY MORNING 90 tablet 1   glucose blood (FREESTYLE LITE) test strip Use to test blood sugar 4 times a day 100 each 12   Lancets (FREESTYLE) lancets Use to test blood sugar 4 times a day 100 each 0   minoxidil (LONITEN) 2.5 MG tablet Take 0.5 tablets (1.25 mg total) by mouth daily.     nitrofurantoin, macrocrystal-monohydrate, (MACROBID) 100 MG capsule Take 1 capsule by mouth 2  times daily as needed. 20 capsule 1   Sod Fluoride-Potassium Nitrate (PREVIDENT 5000 SENSITIVE) 1.1-5 % GEL Brush on teeth 2 times a day for 2 minutes each time, spit as much as possible then do not swish, eat or drink for 30 minutes 100 mL 12   tirzepatide (MOUNJARO) 15 MG/0.5ML Pen Inject 15 mg into the skin once a week. 6 mL 5   tretinoin (RETIN-A) 0.05 % cream Apply topically at bedtime. 135 g 1   Ubrogepant (UBRELVY) 100 MG TABS Take 1 tablet by mouth every 2 hours as needed as directed. Maximum 2 tablets per day. 8 tablet 6   albuterol (VENTOLIN HFA) 108 (90 Base) MCG/ACT inhaler Inhale 1-2 puffs into the lungs every 6 (six) hours as needed for wheezing or shortness of breath. 6.7 g 0   benzonatate (TESSALON) 100 MG capsule Take 1 capsule (100 mg total) by mouth 3 (three) times daily as needed for cough. 21 capsule 0   ciprofloxacin-dexamethasone (CIPRODEX) OTIC suspension Place 4 drops into the left ear 2 (two) times daily. 7.5 mL 0   Facility-Administered Medications Prior to Visit  Medication Dose Route Frequency Provider Last Rate Last Admin   gadobenate dimeglumine (MULTIHANCE) injection 20 mL  20 mL Intravenous Once PRN Melvenia Beam, MD       sodium chloride flush  (NS) 0.9 % injection 10 mL  10 mL Intravenous PRN Melvenia Beam, MD   20 mL at 10/26/20 1345    Allergies  Allergen Reactions   Erythromycin Diarrhea    Review of Systems  Constitutional:  Positive for malaise/fatigue. Negative for fever.  HENT:  Negative for congestion.   Eyes:  Negative for blurred vision.  Respiratory:  Negative for shortness of breath.   Cardiovascular:  Negative for chest pain, palpitations and leg swelling.  Gastrointestinal:  Negative for abdominal pain, blood in stool and nausea.  Genitourinary:  Positive for frequency and urgency. Negative for dysuria and hematuria.  Musculoskeletal:  Negative for falls.  Skin:  Negative for rash.  Neurological:  Negative for dizziness, loss of consciousness and headaches.  Endo/Heme/Allergies:  Negative for environmental allergies.  Psychiatric/Behavioral:  Negative for depression. The patient is not nervous/anxious.        Objective:    Physical Exam Constitutional:      General: She is not in acute distress.    Appearance: She is well-developed.  HENT:     Head: Normocephalic and atraumatic.  Eyes:     Conjunctiva/sclera: Conjunctivae normal.  Neck:     Thyroid: No thyromegaly.  Cardiovascular:     Rate and Rhythm: Normal rate and regular rhythm.     Heart sounds: Normal heart sounds. No murmur heard. Pulmonary:     Effort: Pulmonary effort is normal. No respiratory distress.     Breath sounds: Normal breath sounds.  Abdominal:     General: Bowel sounds are normal. There is no distension.     Palpations: Abdomen is soft. There is no mass.     Tenderness: There is no abdominal tenderness.  Musculoskeletal:     Cervical back: Neck supple.  Lymphadenopathy:     Cervical: No cervical adenopathy.  Skin:    General: Skin is warm and dry.  Neurological:     Mental Status: She is alert and oriented to person, place, and time.  Psychiatric:        Behavior: Behavior normal.     BP 115/78 (BP Location:  Right Arm, Patient Position: Sitting, Cuff Size: Normal)   Pulse 71   Temp 98 F (36.7 C) (Oral)   Resp 16   Ht _0  (1.676 m)   Wt 203 lb 3.2 oz (92.2 kg)   SpO2 96%   BMI 32.80 kg/m  Wt Readings from Last 3 Encounters:  08/05/22 203 lb 3.2 oz (92.2 kg)  01/03/22 206 lb 6.4 oz (93.6 kg)  11/09/21 206 lb 12.8 oz (93.8 kg)    Diabetic Foot Exam - Simple   No data filed    Lab Results  Component Value Date   WBC 6.2 08/05/2022   HGB 14.2 08/05/2022   HCT 42.2 08/05/2022   PLT 276.0 08/05/2022   GLUCOSE 87 08/05/2022   CHOL 151 08/05/2022   TRIG 81.0 08/05/2022   HDL 67.90 08/05/2022   LDLCALC 67 08/05/2022   ALT 28 08/05/2022   AST 23 08/05/2022   NA 138 08/05/2022   K 5.0 08/05/2022   CL 104 08/05/2022   CREATININE 0.93 08/05/2022   BUN 14 08/05/2022   CO2 26 08/05/2022   TSH 3.75 08/05/2022   INR 1.1 06/01/2022   HGBA1C 5.3 08/05/2022   MICROALBUR 0.3 08/05/2022    Lab Results  Component Value Date   TSH 3.75 08/05/2022   Lab Results  Component Value Date   WBC 6.2 08/05/2022   HGB 14.2 08/05/2022   HCT 42.2 08/05/2022   MCV 95.9 08/05/2022   PLT 276.0 08/05/2022   Lab Results  Component Value Date   NA 138 08/05/2022   K 5.0 08/05/2022   CO2 26 08/05/2022   GLUCOSE 87 08/05/2022   BUN 14 08/05/2022  CREATININE 0.93 08/05/2022   BILITOT 1.0 08/05/2022   ALKPHOS 77 08/05/2022   AST 23 08/05/2022   ALT 28 08/05/2022   PROT 7.0 08/05/2022   ALBUMIN 4.2 08/05/2022   CALCIUM 9.5 08/05/2022   ANIONGAP 10 06/01/2022   GFR 70.91 08/05/2022   Lab Results  Component Value Date   CHOL 151 08/05/2022   Lab Results  Component Value Date   HDL 67.90 08/05/2022   Lab Results  Component Value Date   LDLCALC 67 08/05/2022   Lab Results  Component Value Date   TRIG 81.0 08/05/2022   Lab Results  Component Value Date   CHOLHDL 2 08/05/2022   Lab Results  Component Value Date   HGBA1C 5.3 08/05/2022       Assessment & Plan:    Problem List Items Addressed This Visit     Anxiety and depression    Stress continues to be elevated. Will add Wellbutrin SR 100 mg bid which she has used in the past.       Relevant Medications   buPROPion ER (WELLBUTRIN SR) 100 MG 12 hr tablet   Diabetes mellitus type 2 in obese (HCC)    hgba1c acceptable, minimize simple carbs. Increase exercise as tolerated. Continue current meds. Start Enalapril 2.5 mg daily for renal protection.       Relevant Orders   CBC (Completed)   TSH (Completed)   Hemoglobin A1c (Completed)   Microalbumin, urine (Completed)   Hyperlipidemia - Primary    Encourage heart healthy diet such as MIND or DASH diet, increase exercise, avoid trans fats, simple carbohydrates and processed foods, consider a krill or fish or flaxseed oil cap daily. Tolerating Atorvastatin      Relevant Medications   minoxidil (LONITEN) 2.5 MG tablet   Other Relevant Orders   CBC (Completed)   Comprehensive metabolic panel (Completed)   Lipid panel (Completed)   TSH (Completed)   Vitamin D deficiency    Supplement and monitor      Relevant Orders   CBC (Completed)   VITAMIN D 25 Hydroxy (Vit-D Deficiency, Fractures) (Completed)   Chronic migraine w/o aura w/o status migrainosus, not intractable    Follows with neurology      Relevant Medications   minoxidil (LONITEN) 2.5 MG tablet   buPROPion ER (WELLBUTRIN SR) 100 MG 12 hr tablet   Other Relevant Orders   CBC (Completed)   CAD (coronary artery disease)   Relevant Medications   minoxidil (LONITEN) 2.5 MG tablet   Other Relevant Orders   CBC (Completed)   Urinary incontinence    Also notes some atrophic vaginitis symptoms. Will refer to urogynecology for further consideration and treatment. Has also struggled with recurrent urinary tract infections      Relevant Orders   CBC (Completed)   Ambulatory referral to Urogynecology   Pneumonia due to infectious organism    CXR today confirms resolution.        Relevant Orders   DG Chest 2 View (Completed)   Perimenopausal disorder   Relevant Orders   Ambulatory referral to Urogynecology   Atrophic vaginitis   Relevant Orders   CBC (Completed)   Ambulatory referral to Urogynecology    I am having Dr. Starlyn Skeans start on buPROPion ER. I am also having her maintain her Cetirizine HCl, Erenumab-aooe, aspirin EC, cyclobenzaprine, tretinoin, blood glucose meter kit and supplies, FreeStyle Lite, atorvastatin, ciprofloxacin-dexamethasone, Premarin, albuterol, benzonatate, freestyle, escitalopram, escitalopram, nitrofurantoin (macrocrystal-monohydrate), tirzepatide, FREESTYLE LITE, Ubrelvy, PreviDent 5000 Sensitive, and  minoxidil.  Meds ordered this encounter  Medications   buPROPion ER (WELLBUTRIN SR) 100 MG 12 hr tablet    Sig: Take 1 tablet (100 mg total) by mouth 2 (two) times daily.    Dispense:  90 tablet    Refill:  1     Penni Homans, MD

## 2022-08-07 NOTE — Assessment & Plan Note (Signed)
Encourage heart healthy diet such as MIND or DASH diet, increase exercise, avoid trans fats, simple carbohydrates and processed foods, consider a krill or fish or flaxseed oil cap daily. Tolerating Atorvastatin 

## 2022-08-07 NOTE — Assessment & Plan Note (Signed)
CXR today confirms resolution.

## 2022-08-07 NOTE — Assessment & Plan Note (Addendum)
Also notes some atrophic vaginitis symptoms. Will refer to urogynecology for further consideration and treatment. Has also struggled with recurrent urinary tract infections

## 2022-08-07 NOTE — Assessment & Plan Note (Signed)
Stress continues to be elevated. Will add Wellbutrin SR 100 mg bid which she has used in the past.

## 2022-08-16 ENCOUNTER — Ambulatory Visit (INDEPENDENT_AMBULATORY_CARE_PROVIDER_SITE_OTHER): Payer: 59 | Admitting: Family Medicine

## 2022-08-16 VITALS — BP 116/78 | HR 69 | Ht 66.0 in | Wt 201.2 lb

## 2022-08-16 DIAGNOSIS — G8929 Other chronic pain: Secondary | ICD-10-CM

## 2022-08-16 DIAGNOSIS — M545 Low back pain, unspecified: Secondary | ICD-10-CM | POA: Diagnosis not present

## 2022-08-16 DIAGNOSIS — M47816 Spondylosis without myelopathy or radiculopathy, lumbar region: Secondary | ICD-10-CM | POA: Diagnosis not present

## 2022-08-16 NOTE — Progress Notes (Signed)
I, Philbert Riser, LAT, ATC acting as a scribe for Clementeen Graham, MD.  Amanda James is a 51 y.o. 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 Pt was last seen by Dr. Denyse Amass on 11/09/21 and bilat facet injections were order and later obtained on 12/04/21 and repeated on 3/8. Pt contacted the office on 01/04/22 and medial branch block and ablation of bilateral L4-L5, later performed on 3/16. Today, pt reports much relief from the ablation. Pt reports in the last 2-3 weeks then pain is starting to return. Pain is located bilat, R>L, w/ no radiating pain in her LE.   About 3 months ago, pt hit her R elbow. Pt cont to have pain to the posterior aspect of the R elbow, w/ intense nerve pain.  Dx imaging: L-spine MRI- 07/30/20; L-spine XR- 07/21/20  Pertinent review of systems: No fevers or chills  Relevant historical information: CAD.  Diabetes.  Facet DJD lumbar spine.   Exam:  BP 116/78   Pulse 69   Ht 5\' 6"  (1.676 m)   Wt 201 lb 3.2 oz (91.3 kg)   SpO2 94%   BMI 32.47 kg/m  General: Well Developed, well nourished, and in no acute distress.   MSK: L-spine normal lumbar motion normal gait.    Lab and Radiology Results  EXAM: FLUOROSCOPICALLY GUIDED FACET RADIOFREQUENCY ABLATION OF THE BILATERAL L4-L5 FACET JOINTS   FLUOROSCOPY TIME:  Radiation Exposure Index (as provided by the fluoroscopic device): 23.4 mGy Kerma   SEDATION: Moderate (conscious) sedation was employed during this procedure. A total of Versed 4 mg and Fentanyl 100 mcg were administered intravenously. 30 mg Toradol IV were administered at the conclusion of the procedure.   Moderate Sedation Time: 40 minutes. The patient's level of consciousness and vital signs were monitored continuously by radiology nursing throughout the procedure under my direct supervision.   TECHNIQUE: The procedure, risks, benefits, and alternatives were  explained to the patient. Questions regarding the procedure were encouraged and answered. The patient understands and consents to the procedure.   A grounding pad was placed on the right leg and confirmed visually.   An appropriate skin entry site was determined fluoroscopically and marked. Site prepped with betadine, draped in usual sterile fashion, and infiltrated locally with 1% lidocaine.   BILATERAL L3 MEDIAL BRANCH RFA: A posterior oblique approach was taken to the junction of the superior articular process and transverse process on each side at L4 using 20 gauge 15 cm Stryker needles, to lie along the course of the bilateral L3 medial branch nerves. The 10 mm active tip probe was placed through the needle, with subsequent detection of low impedance. Sensory and motor stimulation elicited concordant symptoms at low voltage, without lower extremity sensory or motor symptoms. Using a tandem needle, 1 mL of 1% lidocaine was placed around the probe tip to mitigate the acute symptoms of the ablation.   BILATERAL L4 MEDIAL BRANCH RFA: A posterior oblique approach was taken to the junction of the superior articular process and transverse process on each side at L5 using 20 gauge 15 cm Stryker needles, to lie along the course of the bilateral L4 medial branch nerves. The 10 mm active tip probe was placed through the needle, with subsequent detection of low impedance. Sensory and motor stimulation elicited concordant symptoms at low voltage, without lower extremity sensory or motor symptoms. Using a tandem needle, 1 mL of  1% lidocaine was placed around the probe tip to mitigate the acute symptoms of the ablation.   Finally, the lesions were created uneventfully for 90 seconds at 80 degrees Celsius. Afterwards 30 mg Depo-Medrol mixed with 0.5 mL of 1% lidocaine were administered at the ablation sites. The needles were subsequently removed.   The procedure was well-tolerated. After  the procedure, the patient was able to ambulate without difficulty, leg weakness, or numbness.   IMPRESSION: 1. Technically successful bilateral L4-L5 facet radiofrequency ablations.   Assessment and Plan: 51 y.o. 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  She had excellent medial branch ablation bilateral L4-L5 six months ago.  Her pain is just starting to return now.  Plan for repeat facet medial branch ablation at bilateral L4-L5.  I do not think we need to try the block first as we already know the location and that it is beneficial.  Additionally she should continue conservative management options including core strengthening and weight management.   PDMP not reviewed this encounter. Orders Placed This Encounter  Procedures   DG Facet Jt Neuro Destruct Sing L/S w/Img Guide    Order Specific Question:   Reason for exam:    Answer:   BL L4-L5 repeat ablation. its been 6 months.    Order Specific Question:   Is the patient pregnant?    Answer:   No    Order Specific Question:   Preferred imaging location?    Answer:   [facility]   No orders of the defined types were placed in this encounter.    Discussed warning signs or symptoms. Please see discharge instructions. Patient expresses understanding.   The above documentation has been reviewed and is accurate and complete Clementeen Graham, M.D.

## 2022-08-16 NOTE — Patient Instructions (Signed)
Thank you for coming in today.   Please call [facility] at [phone] to schedule your spine injection.    Let me know how it goes.      

## 2022-08-21 ENCOUNTER — Ambulatory Visit (INDEPENDENT_AMBULATORY_CARE_PROVIDER_SITE_OTHER): Payer: 59 | Admitting: Neurology

## 2022-08-21 DIAGNOSIS — G43009 Migraine without aura, not intractable, without status migrainosus: Secondary | ICD-10-CM

## 2022-08-21 DIAGNOSIS — G43709 Chronic migraine without aura, not intractable, without status migrainosus: Secondary | ICD-10-CM

## 2022-08-21 MED ORDER — ONABOTULINUMTOXINA 200 UNITS IJ SOLR
155.0000 [IU] | Freq: Once | INTRAMUSCULAR | Status: AC
  Administered 2022-08-21: 155 [IU] via INTRAMUSCULAR

## 2022-08-21 NOTE — Progress Notes (Signed)
Botox- 200 units x 1 vial Lot: [id number] Expiration: 01/2025 NDC: 0023-3921-02  Bacteriostatic 0.9% Sodium Chloride- 4mL total Lot: [id number] Expiration: 07/10/2023 NDC: 0409-1966-02  Dx: G43.709  B/B  

## 2022-08-25 ENCOUNTER — Encounter: Payer: Self-pay | Admitting: Neurology

## 2022-08-25 MED ORDER — UBRELVY 100 MG PO TABS: 100.0000 mg | ORAL_TABLET | ORAL | 11 refills | Status: DC | PRN

## 2022-08-25 NOTE — Progress Notes (Signed)
Consent Form Botulism Toxin Injection For Chronic Migraine   08/21/2022: At baseline had >25 migraine days a month. Now only has 5 migraine days a month and Less than 10 total headache days a month, great improvement. Has tried and failed nurtec, imitrex, maxalt, zomig, frovatriptan and uses ubrelvy acutely with great sucess  Meds ordered this encounter  Medications   botulinum toxin Type A (BOTOX) injection 155 Units    Botox- 200 units x 1 vial Lot: [id number] Expiration: 01/2025  NDC: 9509-3267-12  Bacteriostatic 0.9% Sodium Chloride- 66mL total Lot: [id number] Expiration: 07/10/2023 NDC: 4580-9983-38  Dx: G43.709 B/B   Ubrogepant (UBRELVY) 100 MG TABS    Sig: Take 1 tablet by mouth every 2 hours as needed as directed. Maximum 2 tablets per day.    Dispense:  16 tablet    Refill:  11    Has 5 migraines a month less than 10 total headache days a month failed imitrex,nurtec,maxalt,zomig    03/26/2021: Fantastic response> 70% improvement migraine freq and severity.    Reviewed orally with patient, additionally signature is on file:  Botulism toxin has been approved by the Federal drug administration for treatment of chronic migraine. Botulism toxin does not cure chronic migraine and it may not be effective in some patients.  The administration of botulism toxin is accomplished by injecting a small amount of toxin into the muscles of the neck and head. Dosage must be titrated for each individual. Any benefits resulting from botulism toxin tend to wear off after 3 months with a repeat injection required if benefit is to be maintained. Injections are usually done every 3-4 months with maximum effect peak achieved by about 2 or 3 weeks. Botulism toxin is expensive and you should be sure of what costs you will incur resulting from the injection.  The side effects of botulism toxin use for chronic migraine may include:   -Transient, and usually mild, facial weakness with facial  injections  -Transient, and usually mild, head or neck weakness with head/neck injections  -Reduction or loss of forehead facial animation due to forehead muscle weakness  -Eyelid drooping  -Dry eye  -Pain at the site of injection or bruising at the site of injection  -Double vision  -Potential unknown long term risks  Contraindications: You should not have Botox if you are pregnant, nursing, allergic to albumin, have an infection, skin condition, or muscle weakness at the site of the injection, or have myasthenia gravis, Lambert-Eaton syndrome, or ALS.  It is also possible that as with any injection, there may be an allergic reaction or no effect from the medication. Reduced effectiveness after repeated injections is sometimes seen and rarely infection at the injection site may occur. All care will be taken to prevent these side effects. If therapy is given over a long time, atrophy and wasting in the muscle injected may occur. Occasionally the patient's become refractory to treatment because they develop antibodies to the toxin. In this event, therapy needs to be modified.  I have read the above information and consent to the administration of botulism toxin.    BOTOX PROCEDURE NOTE FOR MIGRAINE HEADACHE    Contraindications and precautions discussed with patient(above). Aseptic procedure was observed and patient tolerated procedure. Procedure performed by Dr. Artemio Aly  The condition has existed for more than 6 months, and pt does not have a diagnosis of ALS, Myasthenia Gravis or Lambert-Eaton Syndrome.  Risks and benefits of injections discussed and pt agrees to proceed with the  procedure.  Written consent obtained  These injections are medically necessary. Pt  receives good benefits from these injections. These injections do not cause sedations or hallucinations which the oral therapies may cause.  Description of procedure:  The patient was placed in a sitting position. The  standard protocol was used for Botox as follows, with 5 units of Botox injected at each site:   -Procerus muscle, midline injection  -Corrugator muscle, bilateral injection  -Frontalis muscle, bilateral injection, with 2 sites each side, medial injection was performed in the upper one third of the frontalis muscle, in the region vertical from the medial inferior edge of the superior orbital rim. The lateral injection was again in the upper one third of the forehead vertically above the lateral limbus of the cornea, 1.5 cm lateral to the medial injection site.  -Temporalis muscle injection, 4 sites, bilaterally. The first injection was 3 cm above the tragus of the ear, second injection site was 1.5 cm to 3 cm up from the first injection site in line with the tragus of the ear. The third injection site was 1.5-3 cm forward between the first 2 injection sites. The fourth injection site was 1.5 cm posterior to the second injection site.   -Occipitalis muscle injection, 3 sites, bilaterally. The first injection was done one half way between the occipital protuberance and the tip of the mastoid process behind the ear. The second injection site was done lateral and superior to the first, 1 fingerbreadth from the first injection. The third injection site was 1 fingerbreadth superiorly and medially from the first injection site.  -Cervical paraspinal muscle injection, 2 sites, bilateral knee first injection site was 1 cm from the midline of the cervical spine, 3 cm inferior to the lower border of the occipital protuberance. The second injection site was 1.5 cm superiorly and laterally to the first injection site.  -Trapezius muscle injection was performed at 3 sites, bilaterally. The first injection site was in the upper trapezius muscle halfway between the inflection point of the neck, and the acromion. The second injection site was one half way between the acromion and the first injection site. The third  injection was done between the first injection site and the inflection point of the neck.   Will return for repeat injection in 3 months.   155 units of Botox was used, 45U Botox not injected was wasted. The patient tolerated the procedure well, there were no complications of the above procedure.

## 2022-09-13 ENCOUNTER — Ambulatory Visit
Admission: RE | Admit: 2022-09-13 | Discharge: 2022-09-13 | Disposition: A | Discharge status: Home / Self Care | Payer: 59 | Source: Ambulatory Visit | Attending: Family Medicine | Admitting: Family Medicine

## 2022-09-13 ENCOUNTER — Other Ambulatory Visit: Payer: Self-pay | Admitting: Family Medicine

## 2022-09-13 DIAGNOSIS — M545 Low back pain, unspecified: Secondary | ICD-10-CM

## 2022-09-13 DIAGNOSIS — M47816 Spondylosis without myelopathy or radiculopathy, lumbar region: Secondary | ICD-10-CM

## 2022-09-13 MED ORDER — FENTANYL CITRATE PF 50 MCG/ML IJ SOSY
25.0000 ug | PREFILLED_SYRINGE | INTRAMUSCULAR | Status: DC | PRN
  Administered 2022-09-13 (×4): 25 ug via INTRAVENOUS

## 2022-09-13 MED ORDER — METHYLPREDNISOLONE ACETATE 40 MG/ML INJ SUSP (RADIOLOG
120.0000 mg | Freq: Once | INTRAMUSCULAR | Status: AC
  Administered 2022-09-13: 80 mg via INTRA_ARTICULAR

## 2022-09-13 MED ORDER — KETOROLAC TROMETHAMINE 30 MG/ML IJ SOLN
30.0000 mg | Freq: Once | INTRAMUSCULAR | Status: AC
  Administered 2022-09-13: 30 mg via INTRAVENOUS

## 2022-09-13 MED ORDER — SODIUM CHLORIDE 0.9 % IV SOLN: INTRAVENOUS | Status: DC

## 2022-09-13 MED ORDER — MIDAZOLAM HCL 2 MG/2ML IJ SOLN
1.0000 mg | INTRAMUSCULAR | Status: DC | PRN
  Administered 2022-09-13 (×4): 1 mg via INTRAVENOUS

## 2022-09-13 NOTE — Discharge Instructions (Signed)
Radio Frequency Ablation Post Procedure Discharge Instructions ? ?May resume a regular diet and any medications that you routinely take (including pain medications). ?No driving day of procedure. ?Upon discharge go home and rest for at least 4 hours.  May use an ice pack as needed to injection sites on back. ?Remove bandades later, today. ? ? ? ?Please contact our office at [phone] for the following symptoms: ? ?Fever greater than 100 degrees ?Increased swelling, pain, or redness at injection site. ? ? ?Thank you for visiting [facility].  ?

## 2022-09-13 NOTE — Progress Notes (Signed)
Pt back in nursing recovery area. Pt still drowsy from procedure but will wake up when spoken to. Pt follows commands, talks in complete sentences and has no complaints at this time. Pt will be monitored until discharged by Radiologist.   

## 2022-09-16 ENCOUNTER — Other Ambulatory Visit: Payer: Self-pay | Admitting: Family Medicine

## 2022-09-16 DIAGNOSIS — G8929 Other chronic pain: Secondary | ICD-10-CM

## 2022-09-17 ENCOUNTER — Other Ambulatory Visit (HOSPITAL_COMMUNITY): Payer: Self-pay

## 2022-09-17 MED ORDER — ATORVASTATIN CALCIUM 20 MG PO TABS
20.0000 mg | ORAL_TABLET | Freq: Every day | ORAL | 1 refills | Status: DC
  Filled 2022-09-17: qty 90, 90d supply, fill #0
  Filled 2022-12-24: qty 90, 90d supply, fill #1

## 2022-09-17 MED ORDER — CYCLOBENZAPRINE HCL 10 MG PO TABS
ORAL_TABLET | ORAL | 2 refills | Status: DC
  Filled 2022-09-17: qty 30, 10d supply, fill #0
  Filled 2022-10-15: qty 30, 10d supply, fill #1
  Filled 2022-11-14 – 2022-11-21 (×2): qty 30, 10d supply, fill #2

## 2022-09-18 ENCOUNTER — Other Ambulatory Visit (HOSPITAL_COMMUNITY): Payer: Self-pay

## 2022-09-19 DIAGNOSIS — Z760 Encounter for issue of repeat prescription: Secondary | ICD-10-CM | POA: Diagnosis not present

## 2022-10-01 ENCOUNTER — Ambulatory Visit: Payer: 59 | Admitting: Neurology

## 2022-10-16 ENCOUNTER — Other Ambulatory Visit (HOSPITAL_COMMUNITY): Payer: Self-pay

## 2022-10-16 ENCOUNTER — Other Ambulatory Visit: Payer: Self-pay | Admitting: Family Medicine

## 2022-10-16 DIAGNOSIS — N952 Postmenopausal atrophic vaginitis: Secondary | ICD-10-CM

## 2022-10-16 MED ORDER — PREMARIN 0.625 MG/GM VA CREA
1.0000 | TOPICAL_CREAM | VAGINAL | 1 refills | Status: DC
  Filled 2022-10-16: qty 60, 90d supply, fill #0
  Filled 2023-01-18: qty 60, 90d supply, fill #1

## 2022-10-17 ENCOUNTER — Other Ambulatory Visit (HOSPITAL_COMMUNITY): Payer: Self-pay

## 2022-10-18 ENCOUNTER — Institutional Professional Consult (permissible substitution): Payer: 59 | Admitting: Plastic Surgery

## 2022-10-25 ENCOUNTER — Encounter: Payer: Self-pay | Admitting: Plastic Surgery

## 2022-10-25 ENCOUNTER — Ambulatory Visit (INDEPENDENT_AMBULATORY_CARE_PROVIDER_SITE_OTHER): Payer: Self-pay | Admitting: Plastic Surgery

## 2022-10-25 VITALS — BP 107/77 | HR 82 | Ht 66.0 in | Wt 199.6 lb

## 2022-10-25 DIAGNOSIS — Z719 Counseling, unspecified: Secondary | ICD-10-CM | POA: Insufficient documentation

## 2022-10-25 DIAGNOSIS — L719 Rosacea, unspecified: Secondary | ICD-10-CM

## 2022-10-25 NOTE — Progress Notes (Signed)
Patient ID: Amanda James, female    DOB: 21-Nov-1971, 51 y.o.   MRN: [id number]   Chief Complaint  Patient presents with   Advice Only    The patient is a 51 year old female here for evaluation of her face.  She has had a blepharoplasty and neck lift proximately 10 years ago..  She is starting to have a very heavy feel to her upper eyelids.  It looks like her lids have relaxed but the bigger issue is the brow.  She was getting Botox till about a year ago and stopped because it was creating that heavy feel.  She is 5 feet 6 inches tall and weighs 199 pounds.  Her rosacea is noted. She has multiple medical conditions as listed below and had an abdominoplasty, mastopexy, mammoplasty, gastric banding and bladder suspension.  On her neck she is got a nice result medially but has some accentuation lateral to the midline on each side that may be a slight bit of excess fat and loose skin.    Review of Systems  Constitutional: Negative.   Eyes: Negative.   Respiratory: Negative.  Negative for chest tightness.   Cardiovascular: Negative.   Gastrointestinal: Negative.   Endocrine: Negative.   Genitourinary: Negative.   Musculoskeletal: Negative.     Past Medical History:  Diagnosis Date   Allergy    Cervical cancer screening 05/02/2017   Depression    Depression with anxiety    Diabetes mellitus type 2 in obese [facility])    Diabetes mellitus without complication ([place])    History of chicken pox    Hyperlipidemia    Low back pain    Migraines    Obesity 11/10/2016    Past Surgical History:  Procedure Laterality Date   ABDOMINOPLASTY  2012   AUGMENTATION MAMMAPLASTY     maxilift with implants    BLADDER SUSPENSION  2016   LAPAROSCOPIC GASTRIC BANDING  2005   esophageal dilation. band still present but released   MASTOPEXY  2012      Current Outpatient Medications:    aspirin EC 81 MG tablet, Take 1 tablet (81 mg total) by mouth daily. Swallow whole., Disp: 90 tablet, Rfl: 3    atorvastatin (LIPITOR) 20 MG tablet, Take 1 tablet (20 mg total) by mouth daily., Disp: 90 tablet, Rfl: 1   Blood Glucose Monitoring Suppl (FREESTYLE LITE) w/Device KIT, Use to test blood sugar 4 times a day, Disp: 1 kit, Rfl: 0   buPROPion ER (WELLBUTRIN SR) 100 MG 12 hr tablet, Take 1 tablet (100 mg total) by mouth 2 (two) times daily., Disp: 90 tablet, Rfl: 1   Cetirizine HCl 10 MG CAPS, Take 1 capsule by mouth daily., Disp: , Rfl:    conjugated estrogens (PREMARIN) vaginal cream, Place 1 applicatorful vaginally 2 times a week., Disp: 42.5 g, Rfl: 1   cyclobenzaprine (FLEXERIL) 10 MG tablet, TAKE 1 TABLET BY MOUTH 3 TIMES DAILY AS NEEDED FOR MUSCLE SPASM, Disp: 30 tablet, Rfl: 2   enalapril (VASOTEC) 2.5 MG tablet, Take 1 tablet (2.5 mg total) by mouth daily., Disp: 30 tablet, Rfl: 3   escitalopram (LEXAPRO) 20 MG tablet, TAKE 1 TABLET BY MOUTH EVERY MORNING, Disp: 90 tablet, Rfl: 1   glucose blood (FREESTYLE LITE) test strip, Use to test blood sugar 4 times a day, Disp: 100 each, Rfl: 12   Lancets (FREESTYLE) lancets, Use to test blood sugar 4 times a day, Disp: 100 each, Rfl: 0   minoxidil (LONITEN)  2.5 MG tablet, Take 0.5 tablets (1.25 mg total) by mouth daily., Disp: , Rfl:    Sod Fluoride-Potassium Nitrate (PREVIDENT 5000 SENSITIVE) 1.1-5 % GEL, Brush on teeth 2 times a day for 2 minutes each time then spit as much as possible then DO NOT swish, eat or drink for 30 minutes, Disp: 100 mL, Rfl: 12   tirzepatide (MOUNJARO) 15 MG/0.5ML Pen, Inject 15 mg into the skin once a week., Disp: 6 mL, Rfl: 5   Ubrogepant (UBRELVY) 100 MG TABS, Take 1 tablet by mouth every 2 hours as needed as directed. Maximum 2 tablets per day., Disp: 16 tablet, Rfl: 11 No current facility-administered medications for this visit.  Facility-Administered Medications Ordered in Other Visits:    gadobenate dimeglumine (MULTIHANCE) injection 20 mL, 20 mL, Intravenous, Once PRN, Sarina Ill B, MD   sodium chloride flush  (NS) 0.9 % injection 10 mL, 10 mL, Intravenous, PRN, Sarina Ill B, MD, 20 mL at 10/26/20 1345   Objective:   Vitals:   10/25/22 0827  BP: 107/77  Pulse: 82  SpO2: 97%    Physical Exam Vitals reviewed.  Constitutional:      Appearance: Normal appearance.  Cardiovascular:     Rate and Rhythm: Normal rate.     Pulses: Normal pulses.  Skin:    General: Skin is warm.     Capillary Refill: Capillary refill takes less than 2 seconds.     Coloration: Skin is not jaundiced.     Findings: No bruising or lesion.  Neurological:     Mental Status: She is alert and oriented to person, place, and time.  Psychiatric:        Mood and Affect: Mood normal.        Behavior: Behavior normal.        Thought Content: Thought content normal.        Judgment: Judgment normal.     Assessment & Plan:  Rosacea  Encounter for counseling  The patient is a very good candidate for the BBL, halo, and Kybella for the neck.  I think she would do very well with filler for her cheeks as well.  This would be for nonoperative improvement of her neck and face.  I think that it would be important to get her in with [facility] for a consult for good skin care products that will do better for her than using Retin-A.  For the upper face she would need forehead lift and modified upper lid blepharoplasty.  I will provide her with some quotes and we can talk by phone if she has any further questions.  Pictures were obtained of the patient and placed in the chart with the patient's or guardian's permission.   [place], DO

## 2022-11-03 ENCOUNTER — Encounter: Payer: Self-pay | Admitting: Family Medicine

## 2022-11-04 ENCOUNTER — Other Ambulatory Visit: Payer: Self-pay

## 2022-11-04 ENCOUNTER — Other Ambulatory Visit (HOSPITAL_COMMUNITY): Payer: Self-pay

## 2022-11-04 MED ORDER — ENALAPRIL MALEATE 2.5 MG PO TABS
2.5000 mg | ORAL_TABLET | Freq: Every day | ORAL | 1 refills | Status: DC
  Filled 2022-11-04 – 2022-11-21 (×3): qty 90, 90d supply, fill #0
  Filled 2023-02-19: qty 90, 90d supply, fill #1

## 2022-11-08 ENCOUNTER — Telehealth: Payer: 59 | Admitting: Family Medicine

## 2022-11-11 ENCOUNTER — Encounter: Payer: Self-pay | Admitting: *Deleted

## 2022-11-11 ENCOUNTER — Encounter: Payer: Self-pay | Admitting: Plastic Surgery

## 2022-11-12 ENCOUNTER — Telehealth: Payer: Self-pay | Admitting: *Deleted

## 2022-11-12 NOTE — Telephone Encounter (Signed)
Spoke with Dr. Dalbert Garnet to let her know Dr. Ulice Bold said she could see her on Saturday 11/16/22 at 8:15 for BBL and she would need to arrive at 8:00 to have numbing cream applied. She agreed and asked for appointment to be scheduled. Message given to front desk for scheduling.

## 2022-11-13 ENCOUNTER — Other Ambulatory Visit: Payer: Self-pay | Admitting: Family Medicine

## 2022-11-13 DIAGNOSIS — Z1231 Encounter for screening mammogram for malignant neoplasm of breast: Secondary | ICD-10-CM

## 2022-11-14 ENCOUNTER — Other Ambulatory Visit (HOSPITAL_COMMUNITY): Payer: Self-pay

## 2022-11-14 ENCOUNTER — Other Ambulatory Visit: Payer: Self-pay | Admitting: Family Medicine

## 2022-11-14 MED ORDER — BUPROPION HCL ER (SR) 100 MG PO TB12
100.0000 mg | ORAL_TABLET | Freq: Two times a day (BID) | ORAL | 1 refills | Status: DC
  Filled 2022-11-14 – 2022-11-21 (×2): qty 180, 90d supply, fill #0
  Filled 2023-02-19: qty 180, 90d supply, fill #1

## 2022-11-15 ENCOUNTER — Encounter: Payer: Self-pay | Admitting: Plastic Surgery

## 2022-11-15 ENCOUNTER — Ambulatory Visit (INDEPENDENT_AMBULATORY_CARE_PROVIDER_SITE_OTHER): Payer: Self-pay | Admitting: Plastic Surgery

## 2022-11-15 ENCOUNTER — Ambulatory Visit (INDEPENDENT_AMBULATORY_CARE_PROVIDER_SITE_OTHER): Payer: 59 | Admitting: Neurology

## 2022-11-15 VITALS — BP 112/78 | HR 76 | Ht 66.0 in | Wt 200.0 lb

## 2022-11-15 DIAGNOSIS — G43709 Chronic migraine without aura, not intractable, without status migrainosus: Secondary | ICD-10-CM | POA: Diagnosis not present

## 2022-11-15 DIAGNOSIS — Z719 Counseling, unspecified: Secondary | ICD-10-CM

## 2022-11-15 NOTE — Progress Notes (Signed)
Preoperative Dx: Hyperpigmentation and redness of the face  Postoperative Dx:  same  Procedure: laser to face  Anesthesia: none  Description of Procedure:  Risks and complications were explained to the patient. Consent was confirmed and signed. Eye protection was placed. Time out was called and all information was confirmed to be correct. The area  area was prepped with alcohol and wiped dry. The BBL laser was set at 560 and 515 nm and 6.5 and 7 J/cm2. The face was lasered. The patient tolerated the procedure well and there were no complications. The patient is to follow up in 4 weeks.

## 2022-11-15 NOTE — Progress Notes (Signed)
Botox 200 units x 1 vial  LOT #: [id number] NDC: 2355-7322-02 EXP: 04/2025  B/B

## 2022-11-16 ENCOUNTER — Ambulatory Visit: Payer: 59 | Admitting: Plastic Surgery

## 2022-11-17 NOTE — Progress Notes (Unsigned)
Consent Form Botulism Toxin Injection For Chronic Migraine  11/15/2022: stable  08/21/2022: At baseline had >25 migraine days a month. Now only has 5 migraine days a month and Less than 10 total headache days a month, great improvement. Has tried and failed nurtec, imitrex, maxalt, zomig, frovatriptan and uses ubrelvy acutely with great sucess  No orders of the defined types were placed in this encounter.   03/26/2021: Fantastic response> 70% improvement migraine freq and severity.    Reviewed orally with patient, additionally signature is on file:  Botulism toxin has been approved by the Federal drug administration for treatment of chronic migraine. Botulism toxin does not cure chronic migraine and it may not be effective in some patients.  The administration of botulism toxin is accomplished by injecting a small amount of toxin into the muscles of the neck and head. Dosage must be titrated for each individual. Any benefits resulting from botulism toxin tend to wear off after 3 months with a repeat injection required if benefit is to be maintained. Injections are usually done every 3-4 months with maximum effect peak achieved by about 2 or 3 weeks. Botulism toxin is expensive and you should be sure of what costs you will incur resulting from the injection.  The side effects of botulism toxin use for chronic migraine may include:   -Transient, and usually mild, facial weakness with facial injections  -Transient, and usually mild, head or neck weakness with head/neck injections  -Reduction or loss of forehead facial animation due to forehead muscle weakness  -Eyelid drooping  -Dry eye  -Pain at the site of injection or bruising at the site of injection  -Double vision  -Potential unknown long term risks  Contraindications: You should not have Botox if you are pregnant, nursing, allergic to albumin, have an infection, skin condition, or muscle weakness at the site of the injection, or have  myasthenia gravis, Lambert-Eaton syndrome, or ALS.  It is also possible that as with any injection, there may be an allergic reaction or no effect from the medication. Reduced effectiveness after repeated injections is sometimes seen and rarely infection at the injection site may occur. All care will be taken to prevent these side effects. If therapy is given over a long time, atrophy and wasting in the muscle injected may occur. Occasionally the patient's become refractory to treatment because they develop antibodies to the toxin. In this event, therapy needs to be modified.  I have read the above information and consent to the administration of botulism toxin.    BOTOX PROCEDURE NOTE FOR MIGRAINE HEADACHE    Contraindications and precautions discussed with patient(above). Aseptic procedure was observed and patient tolerated procedure. Procedure performed by Dr. Artemio Aly  The condition has existed for more than 6 months, and pt does not have a diagnosis of ALS, Myasthenia Gravis or Lambert-Eaton Syndrome.  Risks and benefits of injections discussed and pt agrees to proceed with the procedure.  Written consent obtained  These injections are medically necessary. Pt  receives good benefits from these injections. These injections do not cause sedations or hallucinations which the oral therapies may cause.  Description of procedure:  The patient was placed in a sitting position. The standard protocol was used for Botox as follows, with 5 units of Botox injected at each site:   -Procerus muscle, midline injection  -Corrugator muscle, bilateral injection  -Frontalis muscle, bilateral injection, with 2 sites each side, medial injection was performed in the upper one third of the frontalis  muscle, in the region vertical from the medial inferior edge of the superior orbital rim. The lateral injection was again in the upper one third of the forehead vertically above the lateral limbus of the  cornea, 1.5 cm lateral to the medial injection site.  -Temporalis muscle injection, 4 sites, bilaterally. The first injection was 3 cm above the tragus of the ear, second injection site was 1.5 cm to 3 cm up from the first injection site in line with the tragus of the ear. The third injection site was 1.5-3 cm forward between the first 2 injection sites. The fourth injection site was 1.5 cm posterior to the second injection site.   -Occipitalis muscle injection, 3 sites, bilaterally. The first injection was done one half way between the occipital protuberance and the tip of the mastoid process behind the ear. The second injection site was done lateral and superior to the first, 1 fingerbreadth from the first injection. The third injection site was 1 fingerbreadth superiorly and medially from the first injection site.  -Cervical paraspinal muscle injection, 2 sites, bilateral knee first injection site was 1 cm from the midline of the cervical spine, 3 cm inferior to the lower border of the occipital protuberance. The second injection site was 1.5 cm superiorly and laterally to the first injection site.  -Trapezius muscle injection was performed at 3 sites, bilaterally. The first injection site was in the upper trapezius muscle halfway between the inflection point of the neck, and the acromion. The second injection site was one half way between the acromion and the first injection site. The third injection was done between the first injection site and the inflection point of the neck.   Will return for repeat injection in 3 months.   155 units of Botox was used, 45U Botox not injected was wasted. The patient tolerated the procedure well, there were no complications of the above procedure.

## 2022-11-18 ENCOUNTER — Ambulatory Visit (INDEPENDENT_AMBULATORY_CARE_PROVIDER_SITE_OTHER): Payer: Self-pay

## 2022-11-18 DIAGNOSIS — Z719 Counseling, unspecified: Secondary | ICD-10-CM

## 2022-11-18 MED ORDER — ONABOTULINUMTOXINA 200 UNITS IJ SOLR
155.0000 [IU] | Freq: Once | INTRAMUSCULAR | Status: AC
  Administered 2022-11-15: 155 [IU] via INTRAMUSCULAR

## 2022-11-21 ENCOUNTER — Other Ambulatory Visit (HOSPITAL_COMMUNITY): Payer: Self-pay

## 2022-11-21 ENCOUNTER — Other Ambulatory Visit: Payer: Self-pay

## 2022-11-21 ENCOUNTER — Other Ambulatory Visit: Payer: Self-pay | Admitting: Family Medicine

## 2022-11-21 DIAGNOSIS — Z1231 Encounter for screening mammogram for malignant neoplasm of breast: Secondary | ICD-10-CM

## 2022-11-22 ENCOUNTER — Ambulatory Visit: Payer: 59 | Admitting: Family Medicine

## 2022-11-22 DIAGNOSIS — H5213 Myopia, bilateral: Secondary | ICD-10-CM | POA: Diagnosis not present

## 2022-11-22 DIAGNOSIS — E119 Type 2 diabetes mellitus without complications: Secondary | ICD-10-CM | POA: Diagnosis not present

## 2022-11-25 ENCOUNTER — Encounter: Payer: Self-pay | Admitting: Family Medicine

## 2022-11-25 ENCOUNTER — Other Ambulatory Visit: Payer: Self-pay | Admitting: Family Medicine

## 2022-11-25 ENCOUNTER — Other Ambulatory Visit: Payer: Self-pay

## 2022-11-25 ENCOUNTER — Other Ambulatory Visit (HOSPITAL_COMMUNITY): Payer: Self-pay

## 2022-11-25 MED ORDER — TIRZEPATIDE 12.5 MG/0.5ML ~~LOC~~ SOAJ
12.5000 mg | SUBCUTANEOUS | 1 refills | Status: DC
  Filled 2022-11-25 (×2): qty 2, 28d supply, fill #0
  Filled 2023-03-23: qty 2, 28d supply, fill #1
  Filled 2023-05-20 – 2023-06-09 (×8): qty 2, 28d supply, fill #2
  Filled 2023-07-02: qty 2, 28d supply, fill #3

## 2022-11-29 ENCOUNTER — Ambulatory Visit (INDEPENDENT_AMBULATORY_CARE_PROVIDER_SITE_OTHER): Payer: Self-pay | Admitting: Plastic Surgery

## 2022-11-29 DIAGNOSIS — Z719 Counseling, unspecified: Secondary | ICD-10-CM

## 2022-11-29 DIAGNOSIS — Z1231 Encounter for screening mammogram for malignant neoplasm of breast: Secondary | ICD-10-CM

## 2022-11-29 NOTE — Progress Notes (Signed)
Filler Injection Procedure Note  Procedure:  Filler administration  Pre-operative Diagnosis: midface volume loss  Post-operative Diagnosis: Same  Complications:  None  Brief history: The patient desires a filler injection. I discussed with the patient this proposed procedure filler which is customized depending on the particular needs of the patient. It is performed for volume loss as a temporary correction. The individual's choice to undergo a surgical procedure is based on the comparison of risks to potential benefits.   Procedure: The area was prepped with alcohol and dried with a clean gauze. The midface area was injected at the 3 sub-regions of the mid-face: zygomaticomalar region, anteromedial cheek region, and submalar region for a total of one and a half syringe on each side of the face. The technique used was serial puncture with equal injections in the 3 sub-regions: the zygomaticomalar region, the anteromedial cheek, and the submalar region.  No complications were noted. Light pressure was held for 5 minutes. She was instructed explicitly in post-operative care.  Restylane Contour x 3 LOT: [id number]

## 2022-12-06 ENCOUNTER — Other Ambulatory Visit (HOSPITAL_COMMUNITY): Payer: Self-pay

## 2022-12-13 NOTE — Progress Notes (Deleted)
   I, Peterson Lombard, LAT, ATC acting as a scribe for Lynne Leader, MD.  Amanda James is a 52 y.o. female who presents to [place] at [facility] today for worsening low back and hip pain. Pt was last seen by Dr. Georgina Snell on 08/16/22 and repeat facet medial branch ablation at bilateral L4-L5 were ordered and later performed on 09/13/22. Today, pt reports   Dx imaging: 07/30/20 L-spine MRI 07/21/20 L-spine XR   Pertinent review of systems: ***  Relevant historical information: ***   Exam:  There were no vitals taken for this visit. General: Well Developed, well nourished, and in no acute distress.   MSK: ***    Lab and Radiology Results No results found for this or any previous visit (from the past 72 hour(s)). No results found.     Assessment and Plan: 52 y.o. female with ***   PDMP not reviewed this encounter. No orders of the defined types were placed in this encounter.  No orders of the defined types were placed in this encounter.    Discussed warning signs or symptoms. Please see discharge instructions. Patient expresses understanding.   ***

## 2022-12-16 ENCOUNTER — Ambulatory Visit: Payer: Commercial Managed Care - PPO | Admitting: Family Medicine

## 2022-12-24 ENCOUNTER — Telehealth: Payer: Self-pay | Admitting: Plastic Surgery

## 2022-12-24 ENCOUNTER — Other Ambulatory Visit (HOSPITAL_COMMUNITY): Payer: Self-pay

## 2022-12-24 ENCOUNTER — Other Ambulatory Visit: Payer: Self-pay

## 2022-12-24 ENCOUNTER — Encounter: Payer: Self-pay | Admitting: Plastic Surgery

## 2022-12-24 ENCOUNTER — Other Ambulatory Visit: Payer: Self-pay | Admitting: Family Medicine

## 2022-12-24 MED ORDER — ESCITALOPRAM OXALATE 20 MG PO TABS
20.0000 mg | ORAL_TABLET | Freq: Every morning | ORAL | 1 refills | Status: DC
  Filled 2022-12-24: qty 90, 90d supply, fill #0
  Filled 2023-04-16: qty 90, 90d supply, fill #1

## 2022-12-24 NOTE — Telephone Encounter (Signed)
LVM to schedule for Halo as she requested.  Also left her information concerning time frame that Halo can be accomplished which is 30 days after her last BBL.  With no son getting to her face at all for safety reason on the Halo.  Pt wants to schedule in April.

## 2022-12-25 ENCOUNTER — Other Ambulatory Visit: Payer: Commercial Managed Care - PPO | Admitting: Plastic Surgery

## 2022-12-25 ENCOUNTER — Encounter (HOSPITAL_COMMUNITY): Payer: Self-pay

## 2022-12-25 ENCOUNTER — Other Ambulatory Visit: Payer: Self-pay

## 2022-12-25 ENCOUNTER — Other Ambulatory Visit (HOSPITAL_COMMUNITY): Payer: Self-pay

## 2022-12-26 ENCOUNTER — Other Ambulatory Visit (HOSPITAL_COMMUNITY): Payer: Self-pay

## 2022-12-29 NOTE — Assessment & Plan Note (Signed)
Encourage heart healthy diet such as MIND or DASH diet, increase exercise, avoid trans fats, simple carbohydrates and processed foods, consider a krill or fish or flaxseed oil cap daily. Tolerating Atorvastatin

## 2022-12-29 NOTE — Assessment & Plan Note (Signed)
Stress continues, Wellbutrin SR 100 mg bid which she has used in the past.

## 2022-12-29 NOTE — Assessment & Plan Note (Signed)
Encouraged heart healthy diet, decrease po intake and increase exercise as tolerated. Needs 7-8 hours of sleep nightly. Avoid trans fats, eat small, frequent meals every 4-5 hours with lean proteins, complex carbs and healthy fats. Minimize simple carbs, high fat foods and processed foods °

## 2022-12-29 NOTE — Assessment & Plan Note (Signed)
hgba1c acceptable, minimize simple carbs. Increase exercise as tolerated. Continue current meds. Start Enalapril 2.5 mg daily for renal protection.

## 2022-12-29 NOTE — Assessment & Plan Note (Signed)
Supplement and monitor 

## 2022-12-30 ENCOUNTER — Encounter: Payer: Self-pay | Admitting: Family Medicine

## 2022-12-30 ENCOUNTER — Other Ambulatory Visit (HOSPITAL_COMMUNITY): Payer: Self-pay

## 2022-12-30 ENCOUNTER — Telehealth (INDEPENDENT_AMBULATORY_CARE_PROVIDER_SITE_OTHER): Payer: 59 | Admitting: Family Medicine

## 2022-12-30 ENCOUNTER — Other Ambulatory Visit: Payer: Self-pay | Admitting: Neurology

## 2022-12-30 DIAGNOSIS — M545 Low back pain, unspecified: Secondary | ICD-10-CM

## 2022-12-30 DIAGNOSIS — E1169 Type 2 diabetes mellitus with other specified complication: Secondary | ICD-10-CM

## 2022-12-30 DIAGNOSIS — E669 Obesity, unspecified: Secondary | ICD-10-CM

## 2022-12-30 DIAGNOSIS — E782 Mixed hyperlipidemia: Secondary | ICD-10-CM

## 2022-12-30 DIAGNOSIS — E559 Vitamin D deficiency, unspecified: Secondary | ICD-10-CM

## 2022-12-30 DIAGNOSIS — F419 Anxiety disorder, unspecified: Secondary | ICD-10-CM

## 2022-12-30 DIAGNOSIS — F32A Depression, unspecified: Secondary | ICD-10-CM

## 2022-12-30 DIAGNOSIS — L608 Other nail disorders: Secondary | ICD-10-CM | POA: Diagnosis not present

## 2022-12-30 DIAGNOSIS — Z6834 Body mass index (BMI) 34.0-34.9, adult: Secondary | ICD-10-CM

## 2022-12-30 DIAGNOSIS — R0683 Snoring: Secondary | ICD-10-CM | POA: Diagnosis not present

## 2022-12-30 DIAGNOSIS — E6609 Other obesity due to excess calories: Secondary | ICD-10-CM

## 2022-12-30 DIAGNOSIS — M25552 Pain in left hip: Secondary | ICD-10-CM | POA: Diagnosis not present

## 2022-12-30 NOTE — Assessment & Plan Note (Signed)
And low back pain, now with pitting in nails refer to rheumatology for evaluation of psoriasis

## 2022-12-30 NOTE — Assessment & Plan Note (Signed)
She notes fatigue, consider repeating sleep study

## 2022-12-30 NOTE — Progress Notes (Signed)
MyChart Video Visit    Virtual Visit via Video Note   This visit type was conducted due to national recommendations for restrictions regarding the COVID-19 Pandemic (e.g. social distancing) in an effort to limit this patient's exposure and mitigate transmission in our community. This patient is at least at moderate risk for complications without adequate follow up. This format is felt to be most appropriate for this patient at this time. Physical exam was limited by quality of the video and audio technology used for the visit. Shamaine, CMA was able to get the patient set up on a video visit.  Patient location: Home; Patient, provider, and medical scribe in visit Provider location: Office  I discussed the limitations of evaluation and management by telemedicine and the availability of in person appointments. The patient expressed understanding and agreed to proceed.  Visit Date: 12/30/22   Today's healthcare provider: Danise Edge, MD     Subjective:    Patient ID: Amanda James, female    DOB: 1971-06-15, 52 y.o.   MRN: [id number]  Chief Complaint  Patient presents with   Follow-up    Follow up    HPI Patient is in today for a virtual visit.  Diabetes She denies any negative side effects from the ace-inhibitor she is taking.  Left Hip Pain She reports she had a two-week-long flare-up of pain in her left hip and wonders if it is related to menopause.  She is interested in a referral for a rheumatologist.  Lower Back Pain She reports having an ablation performed that has not given her relief.  Brittle Finger/Toe Nails Over the past two years, her fingernails are starting to become brittle with splinter hemorrhages under the nails. This is also happening in her toe nails to a lesser extent.  Family H/o Psoriasis She reports a family history of psoriasis.  She reports that it has been a year since her last menstrual cycle.    Past Medical History:  Diagnosis  Date   Allergy    Cervical cancer screening 05/02/2017   Depression    Depression with anxiety    Diabetes mellitus type 2 in obese [facility])    Diabetes mellitus without complication (HCC)    History of chicken pox    Hyperlipidemia    Low back pain    Migraines    Obesity 11/10/2016    Past Surgical History:  Procedure Laterality Date   ABDOMINOPLASTY  2012   AUGMENTATION MAMMAPLASTY     maxilift with implants    BLADDER SUSPENSION  2016   LAPAROSCOPIC GASTRIC BANDING  2005   esophageal dilation. band still present but released   MASTOPEXY  2012    Family History  Problem Relation Age of Onset   Diabetes Mother    Hypertension Mother    Hyperlipidemia Mother    Obesity Mother    Heart disease Mother        pacer   Diabetes Father    Heart disease Father        MI   Hyperlipidemia Father    Hypertension Father    Obesity Father    Diabetes Sister    Hyperlipidemia Sister    Hypertension Sister    Obesity Sister    Obesity Maternal Grandmother    Hypertension Maternal Grandmother    Hyperlipidemia Maternal Grandmother    Diabetes Maternal Grandmother    Rosacea Maternal Grandmother    Non-Hodgkin's lymphoma Maternal Grandmother    Arthritis Maternal Grandmother  spinal stenosis   Multiple myeloma Maternal Grandfather    Hyperlipidemia Paternal Grandmother        rhabdo from statins   Kidney disease Paternal Grandmother    Migraines Neg Hx     Social History   Socioeconomic History   Marital status: Married    Spouse name: Not on file   Number of children: 1   Years of education: MD   Highest education level: Not on file  Occupational History   Occupation: [employer]  Tobacco Use   Smoking status: Never   Smokeless tobacco: Never  Substance and Sexual Activity   Alcohol use: Yes    Alcohol/week: 5.0 standard drinks of alcohol    Types: 5 Glasses of wine per week    Comment: 3-5 glasses of wine per week   Drug use: No   Sexual activity: Not  on file  Other Topics Concern   Not on file  Social History Narrative   Works With [employer], lives with husband, 3 cats   No major dietary restrictions. Exercise 30 minutes 3 x a week   Seat belts routinely   Right-handed   Caffeine: 1-2 cups of coffee per day   Social Determinants of Health   Financial Resource Strain: Not on file  Food Insecurity: Not on file  Transportation Needs: Not on file  Physical Activity: Not on file  Stress: Not on file  Social Connections: Not on file  Intimate Partner Violence: Not on file    Outpatient Medications Prior to Visit  Medication Sig Dispense Refill   tirzepatide (MOUNJARO) 12.5 MG/0.5ML Pen Inject 12.5 mg into the skin once a week. 6 mL 1   aspirin EC 81 MG tablet Take 1 tablet (81 mg total) by mouth daily. Swallow whole. 90 tablet 3   atorvastatin (LIPITOR) 20 MG tablet Take 1 tablet (20 mg total) by mouth daily. 90 tablet 1   Blood Glucose Monitoring Suppl (FREESTYLE LITE) w/Device KIT Use to test blood sugar 4 times a day 1 kit 0   buPROPion ER (WELLBUTRIN SR) 100 MG 12 hr tablet Take 1 tablet (100 mg total) by mouth 2 (two) times daily. 180 tablet 1   Cetirizine HCl 10 MG CAPS Take 1 capsule by mouth daily.     conjugated estrogens (PREMARIN) vaginal cream Place 1 applicatorful vaginally 2 times a week. 42.5 g 1   cyclobenzaprine (FLEXERIL) 10 MG tablet TAKE 1 TABLET BY MOUTH 3 TIMES DAILY AS NEEDED FOR MUSCLE SPASM 30 tablet 2   enalapril (VASOTEC) 2.5 MG tablet Take 1 tablet (2.5 mg total) by mouth daily. 90 tablet 1   escitalopram (LEXAPRO) 20 MG tablet Take 1 tablet (20 mg total) by mouth every morning. 90 tablet 1   glucose blood (FREESTYLE LITE) test strip Use to test blood sugar 4 times a day 100 each 12   Lancets (FREESTYLE) lancets Use to test blood sugar 4 times a day 100 each 0   minoxidil (LONITEN) 2.5 MG tablet Take 0.5 tablets (1.25 mg total) by mouth daily.     Sod Fluoride-Potassium Nitrate (PREVIDENT 5000 SENSITIVE) 1.1-5  % GEL Brush on teeth 2 times a day for 2 minutes each time then spit as much as possible then DO NOT swish, eat or drink for 30 minutes 100 mL 12   tirzepatide (MOUNJARO) 15 MG/0.5ML Pen Inject 15 mg into the skin once a week. 6 mL 5   Ubrogepant (UBRELVY) 100 MG TABS Take 1 tablet by mouth every  2 hours as needed as directed. Maximum 2 tablets per day. 16 tablet 11   Facility-Administered Medications Prior to Visit  Medication Dose Route Frequency Provider Last Rate Last Admin   gadobenate dimeglumine (MULTIHANCE) injection 20 mL  20 mL Intravenous Once PRN Melvenia Beam, MD       sodium chloride flush (NS) 0.9 % injection 10 mL  10 mL Intravenous PRN Melvenia Beam, MD   20 mL at 10/26/20 1345    Allergies  Allergen Reactions   Erythromycin Diarrhea    Review of Systems  Skin:        (+) Brittle finger/toe nails (+) Splinter hemorrhages in finger and toe nails       Objective:    Physical Exam Constitutional:      General: She is not in acute distress.    Appearance: Normal appearance. She is well-developed. She is not ill-appearing.  HENT:     Head: Normocephalic and atraumatic.  Eyes:     General: Lids are normal.  Pulmonary:     Effort: Pulmonary effort is normal.  Neurological:     Mental Status: She is alert and oriented to person, place, and time.  Psychiatric:        Attention and Perception: Attention and perception normal.        Judgment: Judgment normal.     There were no vitals taken for this visit. Wt Readings from Last 3 Encounters:  11/15/22 200 lb (90.7 kg)  10/25/22 199 lb 9.6 oz (90.5 kg)  08/16/22 201 lb 3.2 oz (91.3 kg)    Diabetic Foot Exam - Simple   No data filed    Lab Results  Component Value Date   WBC 6.2 08/05/2022   HGB 14.2 08/05/2022   HCT 42.2 08/05/2022   PLT 276.0 08/05/2022   GLUCOSE 87 08/05/2022   CHOL 151 08/05/2022   TRIG 81.0 08/05/2022   HDL 67.90 08/05/2022   LDLCALC 67 08/05/2022   ALT 28 08/05/2022    AST 23 08/05/2022   NA 138 08/05/2022   K 5.0 08/05/2022   CL 104 08/05/2022   CREATININE 0.93 08/05/2022   BUN 14 08/05/2022   CO2 26 08/05/2022   TSH 3.75 08/05/2022   INR 1.1 06/01/2022   HGBA1C 5.3 08/05/2022   MICROALBUR 0.3 08/05/2022    Lab Results  Component Value Date   TSH 3.75 08/05/2022   Lab Results  Component Value Date   WBC 6.2 08/05/2022   HGB 14.2 08/05/2022   HCT 42.2 08/05/2022   MCV 95.9 08/05/2022   PLT 276.0 08/05/2022   Lab Results  Component Value Date   NA 138 08/05/2022   K 5.0 08/05/2022   CO2 26 08/05/2022   GLUCOSE 87 08/05/2022   BUN 14 08/05/2022   CREATININE 0.93 08/05/2022   BILITOT 1.0 08/05/2022   ALKPHOS 77 08/05/2022   AST 23 08/05/2022   ALT 28 08/05/2022   PROT 7.0 08/05/2022   ALBUMIN 4.2 08/05/2022   CALCIUM 9.5 08/05/2022   ANIONGAP 10 06/01/2022   GFR 70.91 08/05/2022   Lab Results  Component Value Date   CHOL 151 08/05/2022   Lab Results  Component Value Date   HDL 67.90 08/05/2022   Lab Results  Component Value Date   LDLCALC 67 08/05/2022   Lab Results  Component Value Date   TRIG 81.0 08/05/2022   Lab Results  Component Value Date   CHOLHDL 2 08/05/2022   Lab Results  Component  Value Date   HGBA1C 5.3 08/05/2022       Assessment & Plan:   Problem List Items Addressed This Visit     Anxiety and depression    Stress continues, Wellbutrin SR 100 mg bid which she has used in the past.       Diabetes mellitus type 2 in obese (HCC)    hgba1c acceptable, minimize simple carbs. Increase exercise as tolerated. Continue current meds. Start Enalapril 2.5 mg daily for renal protection.       Relevant Orders   Hemoglobin A1c   Comprehensive metabolic panel   TSH   Hyperlipidemia    Encourage heart healthy diet such as MIND or DASH diet, increase exercise, avoid trans fats, simple carbohydrates and processed foods, consider a krill or fish or flaxseed oil cap daily. Tolerating Atorvastatin       Relevant Orders   TSH   Lipid panel   Vitamin D deficiency - Primary    Supplement and monitor       Relevant Orders   VITAMIN D 25 Hydroxy (Vit-D Deficiency, Fractures)   Low back pain   Relevant Orders   Ambulatory referral to Rheumatology   CBC+Platelet+Hem Review   Sedimentation rate   Rheumatoid Factor   Antinuclear Antib (ANA)   HLA B*5701   Class 1 obesity due to excess calories with serious comorbidity and body mass index (BMI) of 34.0 to 34.9 in adult    Encouraged heart healthy diet, decrease po intake and increase exercise as tolerated. Needs 7-8 hours of sleep nightly. Avoid trans fats, eat small, frequent meals every 4-5 hours with lean proteins, complex carbs and healthy fats. Minimize simple carbs, high fat foods and processed foods       Loud snoring    She notes fatigue, consider repeating sleep study      Pitting of nails   Relevant Orders   CBC+Platelet+Hem Review   Sedimentation rate   Rheumatoid Factor   Antinuclear Antib (ANA)   HLA B*5701   Left hip pain    And low back pain, now with pitting in nails refer to rheumatology for evaluation of psoriasis      Relevant Orders   Ambulatory referral to Rheumatology   Sedimentation rate   Rheumatoid Factor   Antinuclear Antib (ANA)   HLA B*5701    @ENCMEDP @  No orders of the defined types were placed in this encounter.   I discussed the assessment and treatment plan with the patient. The patient was provided an opportunity to ask questions and all were answered. The patient agreed with the plan and demonstrated an understanding of the instructions.   The patient was advised to call back or seek an in-person evaluation if the symptoms worsen or if the condition fails to improve as anticipated.   I,Alexander Ruley,acting as a scribe for Penni Homans, MD.,have documented all relevant documentation on the behalf of Penni Homans, MD,as directed by  Penni Homans, MD while in the presence of Penni Homans,  MD.    Penni Homans, MD [facility] [phone] (phone) [phone] (fax)  [place]

## 2023-01-02 ENCOUNTER — Other Ambulatory Visit (HOSPITAL_COMMUNITY): Payer: Self-pay

## 2023-01-02 ENCOUNTER — Other Ambulatory Visit: Payer: Self-pay | Admitting: *Deleted

## 2023-01-02 DIAGNOSIS — G43009 Migraine without aura, not intractable, without status migrainosus: Secondary | ICD-10-CM

## 2023-01-02 MED ORDER — UBRELVY 100 MG PO TABS
100.0000 mg | ORAL_TABLET | ORAL | 11 refills | Status: AC | PRN
  Filled 2023-01-02: qty 16, 2d supply, fill #0
  Filled 2023-01-18: qty 16, 8d supply, fill #0
  Filled 2023-01-21: qty 16, 30d supply, fill #0
  Filled 2023-02-03 – 2023-02-26 (×2): qty 16, 30d supply, fill #1
  Filled 2023-05-16: qty 16, 30d supply, fill #2
  Filled 2023-07-25: qty 16, 30d supply, fill #3
  Filled 2023-09-22: qty 16, 30d supply, fill #4
  Filled 2023-10-15: qty 16, 30d supply, fill #5
  Filled 2023-11-14: qty 16, 30d supply, fill #6

## 2023-01-03 ENCOUNTER — Ambulatory Visit (INDEPENDENT_AMBULATORY_CARE_PROVIDER_SITE_OTHER): Payer: 59 | Admitting: Obstetrics and Gynecology

## 2023-01-03 ENCOUNTER — Other Ambulatory Visit: Payer: Self-pay

## 2023-01-03 ENCOUNTER — Encounter: Payer: Self-pay | Admitting: Obstetrics and Gynecology

## 2023-01-03 ENCOUNTER — Other Ambulatory Visit (INDEPENDENT_AMBULATORY_CARE_PROVIDER_SITE_OTHER): Payer: 59

## 2023-01-03 ENCOUNTER — Other Ambulatory Visit (HOSPITAL_COMMUNITY): Payer: Self-pay

## 2023-01-03 VITALS — BP 111/79 | HR 69 | Ht 65.0 in | Wt 201.0 lb

## 2023-01-03 DIAGNOSIS — R350 Frequency of micturition: Secondary | ICD-10-CM

## 2023-01-03 DIAGNOSIS — E1169 Type 2 diabetes mellitus with other specified complication: Secondary | ICD-10-CM | POA: Diagnosis not present

## 2023-01-03 DIAGNOSIS — M25552 Pain in left hip: Secondary | ICD-10-CM

## 2023-01-03 DIAGNOSIS — N393 Stress incontinence (female) (male): Secondary | ICD-10-CM | POA: Diagnosis not present

## 2023-01-03 DIAGNOSIS — E782 Mixed hyperlipidemia: Secondary | ICD-10-CM

## 2023-01-03 DIAGNOSIS — N3281 Overactive bladder: Secondary | ICD-10-CM

## 2023-01-03 DIAGNOSIS — L608 Other nail disorders: Secondary | ICD-10-CM | POA: Diagnosis not present

## 2023-01-03 DIAGNOSIS — E559 Vitamin D deficiency, unspecified: Secondary | ICD-10-CM

## 2023-01-03 DIAGNOSIS — M545 Low back pain, unspecified: Secondary | ICD-10-CM | POA: Diagnosis not present

## 2023-01-03 DIAGNOSIS — N816 Rectocele: Secondary | ICD-10-CM

## 2023-01-03 DIAGNOSIS — R159 Full incontinence of feces: Secondary | ICD-10-CM

## 2023-01-03 DIAGNOSIS — N952 Postmenopausal atrophic vaginitis: Secondary | ICD-10-CM

## 2023-01-03 DIAGNOSIS — E669 Obesity, unspecified: Secondary | ICD-10-CM

## 2023-01-03 LAB — COMPREHENSIVE METABOLIC PANEL
ALT: 40 U/L — ABNORMAL HIGH (ref 0–35)
AST: 26 U/L (ref 0–37)
Albumin: 4.4 g/dL (ref 3.5–5.2)
Alkaline Phosphatase: 65 U/L (ref 39–117)
BUN: 20 mg/dL (ref 6–23)
CO2: 27 mEq/L (ref 19–32)
Calcium: 9.7 mg/dL (ref 8.4–10.5)
Chloride: 103 mEq/L (ref 96–112)
Creatinine, Ser: 0.86 mg/dL (ref 0.40–1.20)
GFR: 77.66 mL/min (ref >60.00)
Glucose, Bld: 91 mg/dL (ref 70–99)
Potassium: 4.1 mEq/L (ref 3.5–5.1)
Sodium: 138 mEq/L (ref 135–145)
Total Bilirubin: 1.2 mg/dL (ref 0.2–1.2)
Total Protein: 7.5 g/dL (ref 6.0–8.3)

## 2023-01-03 LAB — POCT URINALYSIS DIPSTICK
Bilirubin, UA: NEGATIVE
Glucose, UA: NEGATIVE
Ketones, UA: NEGATIVE
Leukocytes, UA: NEGATIVE
Nitrite, UA: NEGATIVE
Protein, UA: NEGATIVE
Spec Grav, UA: 1.02 (ref 1.010–1.025)
Urobilinogen, UA: 0.2 E.U./dL
pH, UA: 7 (ref 5.0–8.0)

## 2023-01-03 LAB — LIPID PANEL
Cholesterol: 147 mg/dL (ref 0–200)
HDL: 65.3 mg/dL (ref >39.00)
LDL Cholesterol: 63 mg/dL (ref 0–99)
NonHDL: 81.52
Total CHOL/HDL Ratio: 2
Triglycerides: 93 mg/dL (ref 0.0–149.0)
VLDL: 18.6 mg/dL (ref 0.0–40.0)

## 2023-01-03 LAB — VITAMIN D 25 HYDROXY (VIT D DEFICIENCY, FRACTURES): VITD: 77.89 ng/mL (ref 30.00–100.00)

## 2023-01-03 LAB — HEMOGLOBIN A1C: Hgb A1c MFr Bld: 5.2 % (ref 4.6–6.5)

## 2023-01-03 LAB — TSH: TSH: 3.66 u[IU]/mL (ref 0.35–5.50)

## 2023-01-03 LAB — SEDIMENTATION RATE: Sed Rate: 17 mm/hr (ref 0–30)

## 2023-01-03 MED ORDER — SOLIFENACIN SUCCINATE 5 MG PO TABS
5.0000 mg | ORAL_TABLET | Freq: Every day | ORAL | 5 refills | Status: DC
  Filled 2023-01-03: qty 30, 30d supply, fill #0
  Filled 2023-02-03: qty 30, 30d supply, fill #1
  Filled 2023-03-03: qty 30, 30d supply, fill #2
  Filled 2023-04-16: qty 30, 30d supply, fill #3
  Filled 2023-05-16: qty 30, 30d supply, fill #4
  Filled 2023-05-26 – 2023-06-13 (×2): qty 30, 30d supply, fill #5

## 2023-01-03 NOTE — Patient Instructions (Addendum)
Today we talked about ways to manage bladder urgency such as altering your diet to avoid irritative beverages and foods (bladder diet) as well as attempting to decrease stress and other exacerbating factors.    The Most Bothersome Foods* The Least Bothersome Foods*  Coffee - Regular & Decaf Tea - caffeinated Carbonated beverages - cola, non-colas, diet & caffeine-free Alcohols - Beer, Red Wine, White Wine, Champagne Fruits - Grapefruit, Lemon, Orange, Pineapple Fruit Juices - Cranberry, Grapefruit, Orange, Pineapple Vegetables - Tomato & Tomato Products Flavor Enhancers - Hot peppers, Spicy foods, Chili, Horseradish, Vinegar, Monosodium glutamate (MSG) Artificial Sweeteners - NutraSweet, Sweet 'N Low, Equal (sweetener), Saccharin Ethnic foods - Mexican, Thai, Indian food Water Milk - low-fat & whole Fruits - Bananas, Blueberries, Honeydew melon, Pears, Raisins, Watermelon Vegetables - Broccoli, Brussels Sprouts, Cabbage, Carrots, Cauliflower, Celery, Cucumber, Mushrooms, Peas, Radishes, Squash, Zucchini, White potatoes, Sweet potatoes & yams Poultry - Chicken, Eggs, Turkey, Meat - Beef, Pork, Lamb Seafood - Shrimp, Tuna fish, Salmon Grains - Oat, Rice Snacks - Pretzels, Popcorn  *Friedlander J. et al. Diet and its role in interstitial cystitis/bladder pain syndrome (IC/BPS) and comorbid conditions. [employer]. [employer]. 2012 Jan 11.    Accidental Bowel Leakage: Our goal is to achieve formed bowel movements daily or every-other-day without leakage.  You may need to try different combinations of the following options to find what works best for you.  Some management options include: Dietary changes (more leafy greens, vegetables and fruits; less processed foods) Fiber supplementation (Metamucil or something with psyllium as active ingredient) Over-the-counter imodium (tablets or liquid) to help solidify the stool and prevent leakage of stool.     URODYNAMICS (UDS) TEST  INFORMATION  IMPORTANT: Please try to arrive with a comfortably full bladder!    What is UDS? Urodynamics is a bladder test used to evaluate how your bladder and urethra (tube you urinate out of) work to help find out the cause of your bladder symptoms and evaluate your bladder function in order to make the best treatment plan for you.   What to expect? A nurse will perform the test and will be with you during the entire exam. First we will have to empty your bladder on a special toilet.  After you have emptied your bladder, very small catheters (plastic tubing) will be placed into your bladder and into your vagina (or rectum). These special small catheters measure pressure to help measure your bladder function.  Your bladder will be gently filled with water and you will be asked to cough and strain at several different points during the test.   You will then be asked to empty your bladder in the special toilet with the catheters in place. Most patients can urinate (pee) easily with the catheters in place since the catheters are so small. In total this procedure lasts about 45 minutes to 1 hour.  After your test is completed, you will return (or possibly be seen the same day) to review the results, talk about treatment options and make a plan moving forward.  

## 2023-01-03 NOTE — Progress Notes (Unsigned)
[facility] New Patient Evaluation and Consultation  Referring Provider: Mosie Lukes, MD PCP: Mosie Lukes, MD Date of Service: 01/03/2023  SUBJECTIVE Chief Complaint: New Patient (Initial Visit) (Amanda James is a 52 y.o. female here for a consult for incontinence. )  History of Present Illness: Amanda James is a 52 y.o. White or Caucasian female seen in consultation at the request of Dr. Charlett Blake for evaluation of incontinence.    Review of records significant for: Has increasing urinary incontinence  Urinary Symptoms: Leaks urine with cough/ sneeze, laughing, exercise, lifting, during sex, and with movement to the bathroom. UUI > SUI Leaks 2-3 time(s) per day.  Pad use: 1 liners/ mini-pads per day.   She is bothered by her UI symptoms. Had a mesh sling for stress incontinence about 10 years and has noticed some return of her symptoms.  Has done some pelvic floor exercises but not pelvic PT  Day time voids 5-6.  Nocturia: 1-2 times per night to void. Voiding dysfunction: she does not empty her bladder well.  does not use a catheter to empty bladder.  When urinating, she feels a weak stream Drinks: 3-4 cups coffee per day, 1 can diet soda, at least 60-80oz water per day  UTIs: 1 UTI's in the last year.   Denies history of blood in urine and kidney or bladder stones  Pelvic Organ Prolapse Symptoms:                  Admits to a feeling of a bulge the vaginal area. It has been present for a few years.  Denies seeing a bulge.  This bulge is not bothersome.  Bowel Symptom: Bowel movements: every 1-2 days  Stool consistency: soft  Straining: yes.  Splinting: no.  Incomplete evacuation: no.  Admits to accidental bowel leakage / fecal incontinence  Occurs: most days after BM  Consistency with leakage: soft   Has a history of hemorrhoidal banding Bowel regimen: miralax every day Last colonoscopy: Date 2014, Results- diverticula and hemorrhoids, no  polyps  Sexual Function Sexually active: yes.  Sexual orientation:  heterosexual Pain with sex: Yes, at the vaginal opening, has discomfort due to dryness  Pelvic Pain Denies pelvic pain  Has had itching and discomfort over the vulva. Had been concerned about possible lichen sclerosus. Has been using vaginal estrogen cream regularly.    Past Medical History:  Past Medical History:  Diagnosis Date   Allergy    Cervical cancer screening 05/02/2017   Depression    Depression with anxiety    Diabetes mellitus type 2 in obese ([facility])    Diabetes mellitus without complication ([facility])    History of chicken pox    Hyperlipidemia    Low back pain    Migraines    Obesity 11/10/2016   Possible history of psoriasis in her nails- being worked up  Past Surgical History:   Past Surgical History:  Procedure Laterality Date   ABDOMINOPLASTY  2012   [place] with implants    INCONTINENCE SURGERY  2016   LAPAROSCOPIC GASTRIC BANDING  2005   esophageal dilation. band still present but released   MASTOPEXY  2012     Past OB/GYN History: OB History  Gravida Para Term Preterm AB Living  1 1 1     1   SAB IAB Ectopic Multiple Live Births          1    # Outcome Date  GA Lbr Len/2nd Weight Sex Delivery Anes PTL Lv  1 Term      Vag-Spont      Menopausal: Yes, at age 53, Denies vaginal bleeding since menopause Last pap smear was 2021- negative.     Medications: She has a current medication list which includes the following prescription(s): solifenacin, tirzepatide, aspirin ec, atorvastatin, freestyle lite, bupropion er, cetirizine hcl, premarin, cyclobenzaprine, enalapril, escitalopram, freestyle lite, freestyle, minoxidil, prevident 5000 sensitive, tirzepatide, and ubrelvy, and the following Facility-Administered Medications: gadobenate dimeglumine and sodium chloride flush.   Allergies: Patient is allergic to erythromycin.   Social History:  Social History    Tobacco Use   Smoking status: Never   Smokeless tobacco: Never  Vaping Use   Vaping Use: Never used  Substance Use Topics   Alcohol use: Yes    Alcohol/week: 5.0 standard drinks of alcohol    Types: 5 Glasses of wine per week    Comment: 3-5 glasses of wine per week   Drug use: No    Relationship status: married She lives with husband Gaspar Bidding.   She is employed as a Engineer, drilling with [employer] Regular exercise: Yes: strength exercise and PT History of abuse: No  Family History:   Family History  Problem Relation Age of Onset   Diabetes Mother    Hypertension Mother    Hyperlipidemia Mother    Obesity Mother    Heart disease Mother        pacer   Diabetes Father    Heart disease Father        MI   Hyperlipidemia Father    Hypertension Father    Obesity Father    Diabetes Sister    Hyperlipidemia Sister    Hypertension Sister    Obesity Sister    Obesity Maternal Grandmother    Hypertension Maternal Grandmother    Hyperlipidemia Maternal Grandmother    Diabetes Maternal Grandmother    Rosacea Maternal Grandmother    Non-Hodgkin's lymphoma Maternal Grandmother    Arthritis Maternal Grandmother        spinal stenosis   Multiple myeloma Maternal Grandfather    Hyperlipidemia Paternal Grandmother        rhabdo from statins   Kidney disease Paternal Grandmother    Migraines Neg Hx      Review of Systems: Review of Systems  Constitutional:  Positive for malaise/fatigue. Negative for fever and weight loss.  Respiratory:  Negative for cough, shortness of breath and wheezing.   Cardiovascular:  Positive for palpitations. Negative for chest pain and leg swelling.  Gastrointestinal:  Negative for abdominal pain and blood in stool.  Genitourinary:  Negative for dysuria.  Musculoskeletal:  Negative for myalgias.  Skin:  Negative for rash.  Neurological:  Negative for dizziness and headaches.  Endo/Heme/Allergies:  Bruises/bleeds easily.        + hot flashes  Psychiatric/Behavioral:  Positive for depression. The patient is nervous/anxious.      OBJECTIVE Physical Exam: Vitals:   01/03/23 0836  BP: 111/79  Pulse: 69  Weight: 201 lb (91.2 kg)  Height: 5\' 5"  (1.651 m)    Physical Exam Constitutional:      General: She is not in acute distress. Pulmonary:     Effort: Pulmonary effort is normal.  Abdominal:     General: There is no distension.     Palpations: Abdomen is soft.     Tenderness: There is no abdominal tenderness. There is no rebound.  Musculoskeletal:  General: No swelling. Normal range of motion.  Skin:    General: Skin is warm and dry.     Findings: No rash.  Neurological:     Mental Status: She is alert and oriented to person, place, and time.  Psychiatric:        Mood and Affect: Mood normal.        Behavior: Behavior normal.      GU / Detailed Urogynecologic Evaluation:  Pelvic Exam: Normal external female genitalia with atrophic changes to the labia; Bartholin's and Skene's glands normal in appearance; urethral meatus normal in appearance, no urethral masses or discharge.   CST: negative  Speculum exam reveals normal vaginal mucosa with atrophy. Cervix normal appearance. Uterus normal single, nontender. Adnexa no mass, fullness, tenderness.     Pelvic floor strength II/V, puborectalis III/V external anal sphincter III/V  Pelvic floor musculature: Right levator non-tender, Right obturator non-tender, Left levator non-tender, Left obturator non-tender  POP-Q:   POP-Q  -1.5                                            Aa   -1.5                                           Ba  -6.5                                              C   4                                            Gh  3                                            Pb  9                                            tvl   -0.5                                            Ap  -0.5                                            Bp   -8.5                                              D      Rectal Exam:  Normal sphincter tone, small  distal rectocele, enterocoele not present, no rectal masses, no sign of dyssynergia when asking the patient to bear down.  Post-Void Residual (PVR) by Bladder Scan: In order to evaluate bladder emptying, we discussed obtaining a postvoid residual and she agreed to this procedure.  Procedure: The ultrasound unit was placed on the patient's abdomen in the suprapubic region after the patient had voided. A PVR of 1 ml was obtained by bladder scan.  Laboratory Results: POC urine: trace blood, otherwise negtive   ASSESSMENT AND PLAN Ms. Lakey is a 52 y.o. with:  1. Overactive bladder   2. Urinary frequency   3. SUI (stress urinary incontinence, female)   4. Incontinence of feces, unspecified fecal incontinence type   5. Vaginal atrophy   6. Prolapse of posterior vaginal wall    OAB - We discussed the symptoms of overactive bladder (OAB), which include urinary urgency, urinary frequency, nocturia, with or without urge incontinence.  While we do not know the exact etiology of OAB, several treatment options exist. We discussed management including behavioral therapy (decreasing bladder irritants, urge suppression strategies, timed voids, bladder retraining), physical therapy, medication; for refractory cases posterior tibial nerve stimulation, sacral neuromodulation, and intravesical botulinum toxin injection.  - Prescribed vesicare 5mg  daily. For anticholinergic medications, we discussed the potential side effects of anticholinergics including dry eyes, dry mouth, constipation, cognitive impairment and urinary retention. - referral also placed to pelvic PT - Recommended decreasing bladder irritants  2. SUI - For treatment of stress urinary incontinence,  non-surgical options include expectant management, weight loss, physical therapy, as well as a pessary.  Surgical options include a  midurethral sling, Burch urethropexy, and transurethral injection of a bulking agent. - she is interested in urethral bulking. Will have her undergo urodynamic testing.   3. Accidental Bowel Leakage:  - Treatment options include anti-diarrhea medication (loperamide/ Imodium OTC or prescription lomotil), fiber supplements, physical therapy, and possible sacral neuromodulation or surgery.   - Suggested adding metamucil gradually to help with stool bulking.  - Referral also placed to pelvic PT  4. Vaginal atrophy - appears improved with estrace cream. Continue estrace twice weekly.   5. Stage I anterior, Stage II posterior, Stage I apical prolapse - Not bothered by prolapse. Did not discuss treatment in detail today. Will expectantly manage.   Return for urodynamics  Jaquita Folds, MD

## 2023-01-07 ENCOUNTER — Other Ambulatory Visit (HOSPITAL_COMMUNITY): Payer: Self-pay

## 2023-01-08 ENCOUNTER — Telehealth: Payer: Self-pay | Admitting: Neurology

## 2023-01-08 LAB — CBC+PLATELET+HEM REVIEW
BASO(ABSOLUTE): 0.1 10*3/uL (ref 0.0–0.2)
Basophils Manual: 1 %
EOS (ABSOLUTE VALUE): 0.1 10*3/uL (ref 0.0–0.4)
Eosinophils Manual: 2 %
Hematocrit: 40.8 % (ref 34.0–46.6)
Hemoglobin: 13.8 g/dL (ref 11.1–15.9)
Lymphocytes Absolute: 2.7 10*3/uL (ref 0.7–3.1)
Lymphocytes Manual: 41 %
MCH: 32.3 pg (ref 26.6–33.0)
MCHC: 33.8 g/dL (ref 31.5–35.7)
MCV: 96 fL (ref 79–97)
Monocytes Manual: 8 %
Monocytes(Absolute): 0.5 10*3/uL (ref 0.1–0.9)
Neutrophils Absolute: 3.2 10*3/uL (ref 1.4–7.0)
Neutrophils Manual: 48 %
PLATELET COMMENT: ADEQUATE
Platelets: 267 10*3/uL (ref 150–450)
RBC COMMENT: NORMAL
RBC: 4.27 x10E6/uL (ref 3.77–5.28)
RDW: 12.7 % (ref 11.7–15.4)
WBC: 6.7 10*3/uL (ref 3.4–10.8)

## 2023-01-08 LAB — RHEUMATOID FACTOR: Rheumatoid fact SerPl-aCnc: 14 IU/mL — ABNORMAL HIGH (ref <14)

## 2023-01-08 LAB — ANTI-NUCLEAR AB-TITER (ANA TITER): ANA Titer 1: 1:40 {titer} — ABNORMAL HIGH

## 2023-01-08 LAB — ANA: Anti Nuclear Antibody (ANA): POSITIVE — AB

## 2023-01-08 LAB — HLA B*5701: HLA-B*5701 w/rflx HLA-B High: NEGATIVE

## 2023-01-08 NOTE — Telephone Encounter (Signed)
Please start a new botox PA with her [employer]

## 2023-01-09 ENCOUNTER — Other Ambulatory Visit (HOSPITAL_COMMUNITY): Payer: Self-pay

## 2023-01-09 NOTE — Telephone Encounter (Signed)
BOTOX ONE-Benefit Verification [id number] Submitted!

## 2023-01-09 NOTE — Telephone Encounter (Signed)
Pharmacy Patient Advocate Encounter   Received notification from [facility] that prior authorization for Botox 200UNIT solution is required/requested.   PA submitted on 01/09/2023 to (ins) [employer] via CoverMyMeds Key [id number] Status is pending

## 2023-01-09 NOTE — Telephone Encounter (Signed)
Chronic Migraine CPT 64615  Botox [id number] Units:200  G43.709 Chronic Migraine without aura, not intractable, without status migrainous  Appt on 02/07/23

## 2023-01-16 NOTE — Telephone Encounter (Signed)
[facility], have there been any updates on this auth?

## 2023-01-17 ENCOUNTER — Ambulatory Visit
Admission: RE | Admit: 2023-01-17 | Discharge: 2023-01-17 | Disposition: A | Discharge status: Home / Self Care | Payer: 59 | Source: Ambulatory Visit

## 2023-01-17 DIAGNOSIS — Z1231 Encounter for screening mammogram for malignant neoplasm of breast: Secondary | ICD-10-CM | POA: Diagnosis not present

## 2023-01-20 ENCOUNTER — Encounter: Payer: Self-pay | Admitting: Obstetrics and Gynecology

## 2023-01-20 ENCOUNTER — Other Ambulatory Visit (HOSPITAL_COMMUNITY): Payer: Self-pay

## 2023-01-20 ENCOUNTER — Other Ambulatory Visit: Payer: Self-pay | Admitting: Family Medicine

## 2023-01-20 ENCOUNTER — Ambulatory Visit (INDEPENDENT_AMBULATORY_CARE_PROVIDER_SITE_OTHER): Payer: 59 | Admitting: Obstetrics and Gynecology

## 2023-01-20 ENCOUNTER — Other Ambulatory Visit: Payer: Self-pay

## 2023-01-20 VITALS — BP 93/69 | HR 86

## 2023-01-20 DIAGNOSIS — N3281 Overactive bladder: Secondary | ICD-10-CM | POA: Diagnosis not present

## 2023-01-20 DIAGNOSIS — N952 Postmenopausal atrophic vaginitis: Secondary | ICD-10-CM

## 2023-01-20 DIAGNOSIS — N393 Stress incontinence (female) (male): Secondary | ICD-10-CM | POA: Diagnosis not present

## 2023-01-20 DIAGNOSIS — R350 Frequency of micturition: Secondary | ICD-10-CM | POA: Diagnosis not present

## 2023-01-20 LAB — POCT URINALYSIS DIPSTICK
Bilirubin, UA: NEGATIVE
Blood, UA: NEGATIVE
Glucose, UA: NEGATIVE
Ketones, UA: NEGATIVE
Leukocytes, UA: NEGATIVE
Nitrite, UA: NEGATIVE
Protein, UA: NEGATIVE
Spec Grav, UA: <=1.005 — AB (ref 1.010–1.025)
Urobilinogen, UA: 0.2 E.U./dL
pH, UA: 6.5 (ref 5.0–8.0)

## 2023-01-20 MED ORDER — PREMARIN 0.625 MG/GM VA CREA
1.0000 | TOPICAL_CREAM | VAGINAL | 1 refills | Status: DC
  Filled 2023-01-20: qty 30, 90d supply, fill #0
  Filled 2023-01-29: qty 30, 53d supply, fill #0
  Filled 2023-04-16: qty 30, 53d supply, fill #1

## 2023-01-20 NOTE — Patient Instructions (Signed)
Taking Care of Yourself after Urodynamics  Drink plenty of water for a day or two following your procedure. Try to have about 8 ounces (one cup) at a time, and do this 6 times or more per day unless you have fluid restrictitons AVOID irritative beverages such as coffee, tea, soda, alcoholic or citrus drinks for a day or two, as this may cause burning with urination. You may experience some discomfort or a burning sensation with urination after having this procedure. You can use over the counter Azo or pyridium to help with burning and follow the instructions on the packaging. If it does not improve within 1-2 days, or other symptoms appear (fever, chills, or difficulty urinating) call the office to speak to a nurse.  You may return to normal daily activities such as work, school, driving, exercising and housework on the day of the procedure.

## 2023-01-20 NOTE — Progress Notes (Addendum)
[facility] Urodynamics Procedure  Referring Physician: Mosie Lukes, MD Date of Procedure: 01/20/2023  Amanda James is a 52 y.o. female who presents for urodynamic evaluation. Indication(s) for study: SUI  Vital Signs: There were no vitals taken for this visit.  Laboratory Results: A catheterized urine specimen revealed:  POC urine: Negative for all components   Voiding Diary: Not done  Procedure Timeout:  The correct patient was verified and the correct procedure was verified. The patient was in the correct position and safety precautions were reviewed based on at the patient's history.  Urodynamic Procedure A 38F dual lumen urodynamics catheter was placed under sterile conditions into the patient's bladder. A 38F catheter was placed into the rectum in order to measure abdominal pressure. EMG patches were placed in the appropriate position.  All connections were confirmed and calibrations/adjusted made. Saline was instilled into the bladder through the dual lumen catheters.  Cough/valsalva pressures were measured periodically during filling.  Patient was allowed to void.  The bladder was then emptied of its residual.  UROFLOW: Revealed a Qmax of 21.5 mL/sec.  She voided 333.3 mL and had a residual of 25 mL.  It was a normal pattern and represented normal habits.  CMG: This was performed with sterile water in the sitting position at a fill rate of 30 mL/min.    First sensation of fullness was 56 mLs,  First urge was 213 mLs,  Strong urge was 308 mLs and  Capacity was 601 mLs  Stress incontinence was demonstrated Lowest positive Barrier CLPP was 149 cmH20 at 214 ml. Lowest positive Barrier VLPP was 79 cmH20 at 214 ml.  Detrusor function was overactive, with phasic contractions seen.  The first occurred at 55 mL to 3.6 cm of water and was not associated with urge.  Compliance:  Normal. End fill detrusor pressure was 0cmH20.    UPP: MUCP with barrier  reduction was 55 cm of water.    MICTURITION STUDY: Voiding was performed with reduction using scopettes in the sitting position.  Pdet at Qmax was 4 cm of water.  Qmax was 6.5 mL/sec.  It was a interrupted pattern.  She voided 226 mL and had a residual of 375 mL.  It was a volitional void, sustained detrusor contraction was not present (small intermittent contractions) and abdominal straining was present  EMG: This was performed with patches.  She had voluntary contractions, recruitment with fill was present and urethral sphincter was and was not not relaxed with void.  The details of the procedure with the study tracings have been scanned into EPIC.   Urodynamic Impression:  1. Sensation was normal; capacity was normal 2. Stress Incontinence was demonstrated at normal pressures; 3. Detrusor Overactivity was demonstrated without leakage. 4. Emptying was dysfunctional at the end of the study with a elevated PVR (359m), a sustained detrusor contraction not present,  abdominal straining present, and at times had dyssynergic urethral sphincter activity on EMG.  Plan: - The patient will follow up  to discuss the findings and treatment options.

## 2023-01-21 ENCOUNTER — Other Ambulatory Visit: Payer: Self-pay

## 2023-01-21 ENCOUNTER — Other Ambulatory Visit (HOSPITAL_COMMUNITY): Payer: Self-pay

## 2023-01-23 ENCOUNTER — Other Ambulatory Visit: Payer: Self-pay

## 2023-01-23 ENCOUNTER — Other Ambulatory Visit (HOSPITAL_COMMUNITY): Payer: Self-pay

## 2023-01-23 NOTE — Telephone Encounter (Signed)
Pharmacy Patient Advocate Encounter  Received notification from [employer] that the request for prior authorization for Botox 200 units has been denied due to See below.      Please be advised we currently do not have a Pharmacist to review denials, therefore you will need to process appeals accordingly as needed. Thanks for your support at this time.  There is an option to do an E Appeal on CMM with the key: [id number]

## 2023-01-23 NOTE — Telephone Encounter (Signed)
Dr Jaynee Eagles, with patient's new insurance, there is a requirement to try/fail other oral preventives first. We have history of Lexapro and Topiramate trial. Insurance requires one of the following: valproic acid/divalproex sodium, propranolol, timolol, amitriptyline, venlafaxine, atenolol, nadolol, or metoprolol.

## 2023-01-23 NOTE — Telephone Encounter (Signed)
Botox denied

## 2023-01-24 ENCOUNTER — Encounter: Payer: Self-pay | Admitting: Plastic Surgery

## 2023-01-24 ENCOUNTER — Ambulatory Visit (INDEPENDENT_AMBULATORY_CARE_PROVIDER_SITE_OTHER): Payer: Self-pay | Admitting: Plastic Surgery

## 2023-01-24 ENCOUNTER — Other Ambulatory Visit: Payer: Self-pay

## 2023-01-24 ENCOUNTER — Other Ambulatory Visit (HOSPITAL_COMMUNITY): Payer: Self-pay

## 2023-01-24 ENCOUNTER — Other Ambulatory Visit (HOSPITAL_BASED_OUTPATIENT_CLINIC_OR_DEPARTMENT_OTHER): Payer: Self-pay

## 2023-01-24 VITALS — BP 100/74 | HR 74

## 2023-01-24 DIAGNOSIS — Z719 Counseling, unspecified: Secondary | ICD-10-CM

## 2023-01-24 MED ORDER — LIDOCAINE 23% - TETRACAINE 7% TOPICAL OINTMENT (PLASTICIZED)
1.0000 | TOPICAL_OINTMENT | Freq: Once | CUTANEOUS | 0 refills | Status: AC
  Filled 2023-01-24: qty 60, 30d supply, fill #0

## 2023-01-24 NOTE — Progress Notes (Signed)
Preoperative Dx: hyperpigmentation of skin  Postoperative Dx:  same  Procedure: laser to face   Anesthesia: none  Description of Procedure:  Risks and complications were explained to the patient. Consent was confirmed and signed. Eye protection was placed. Time out was called and all information was confirmed to be correct. The area  area was prepped with alcohol and wiped dry. The BBL laser was set at 515 and 560 nm at 7.5 J/cm2. The face was lasered. The patient tolerated the procedure well and there were no complications. The patient is to follow up in 4 weeks.

## 2023-01-25 ENCOUNTER — Other Ambulatory Visit: Payer: Self-pay | Admitting: Plastic Surgery

## 2023-01-27 ENCOUNTER — Other Ambulatory Visit (HOSPITAL_COMMUNITY): Payer: Self-pay

## 2023-01-27 ENCOUNTER — Encounter: Payer: Self-pay | Admitting: *Deleted

## 2023-01-27 NOTE — Telephone Encounter (Signed)
She has taken propranolol and amitriptyline. Please resend authorization. I will use samples on 3/1 if I have to thanks

## 2023-01-28 ENCOUNTER — Other Ambulatory Visit (HOSPITAL_COMMUNITY): Payer: Self-pay

## 2023-01-28 ENCOUNTER — Other Ambulatory Visit: Payer: Self-pay

## 2023-01-28 MED ORDER — ONABOTULINUMTOXINA 200 UNITS IJ SOLR
155.0000 [IU] | INTRAMUSCULAR | 1 refills | Status: DC
  Filled 2023-01-28 – 2023-02-05 (×4): qty 1, 90d supply, fill #0
  Filled 2023-02-27: qty 1, 84d supply, fill #0
  Filled 2023-04-29 – 2023-05-06 (×2): qty 1, 84d supply, fill #1

## 2023-01-28 NOTE — Telephone Encounter (Signed)
Resubmitted PA on CMM for Botox 200 units-I attached the notes where Dr.Ahern states pt has tried the meds that were needed along with the other chart notes. Will update. Hart

## 2023-01-28 NOTE — Telephone Encounter (Signed)
Pharmacy Patient Advocate Encounter- Botox BIV-Pharmacy Benefit:  PA was submitted to [employer] and has been approved through: 07/27/2023 Authorization# PA Case ID: [id number]  Please send prescription to Specialty Pharmacy: [facility]: [phone]  Estimated Copay is: $1056.35  Patient IS eligible for Botox Copay Card, which will make patient's copay as little as zero. Copay card will be provided to pharmacy.

## 2023-01-28 NOTE — Addendum Note (Signed)
Addended by: Brandon Melnick on: 01/28/2023 04:28 PM   Modules accepted: Orders

## 2023-01-29 ENCOUNTER — Other Ambulatory Visit (HOSPITAL_COMMUNITY): Payer: Self-pay

## 2023-01-29 ENCOUNTER — Other Ambulatory Visit: Payer: Self-pay

## 2023-01-30 ENCOUNTER — Other Ambulatory Visit (HOSPITAL_COMMUNITY): Payer: Self-pay

## 2023-01-30 ENCOUNTER — Encounter (HOSPITAL_COMMUNITY): Payer: Self-pay

## 2023-01-31 ENCOUNTER — Ambulatory Visit (INDEPENDENT_AMBULATORY_CARE_PROVIDER_SITE_OTHER): Payer: 59 | Admitting: Obstetrics and Gynecology

## 2023-01-31 ENCOUNTER — Encounter: Payer: Self-pay | Admitting: Obstetrics and Gynecology

## 2023-01-31 ENCOUNTER — Other Ambulatory Visit (HOSPITAL_COMMUNITY): Payer: Self-pay

## 2023-01-31 ENCOUNTER — Other Ambulatory Visit: Payer: Self-pay

## 2023-01-31 VITALS — BP 100/81 | HR 72

## 2023-01-31 DIAGNOSIS — N393 Stress incontinence (female) (male): Secondary | ICD-10-CM

## 2023-01-31 DIAGNOSIS — N3281 Overactive bladder: Secondary | ICD-10-CM | POA: Diagnosis not present

## 2023-01-31 NOTE — Progress Notes (Signed)
[facility] Return Visit  SUBJECTIVE  History of Present Illness: Amanda James is a 52 y.o. female seen in follow-up for mixed incontinence. Last visit she was started on vesicare '5mg'$  daily and she underwent urodynamic testing.   She feels she is about 90% improved with the leakage and urgency. She does notice dry mouth since starting the vesicare but overall is managable.  SUI not as often, but does become worse in allergy season.   Urodynamic Impression:  1. Sensation was normal; capacity was normal 2. Stress Incontinence was demonstrated at normal pressures; 3. Detrusor Overactivity was demonstrated without leakage. 4. Emptying was dysfunctional with a elevated PVR (366m), a sustained detrusor contraction not present,  abdominal straining present, dyssynergic urethral sphincter activity on EMG.  She reports that she normally does not feel she strains to empty and felt uncomfortable with the catheter during her test. She is not symptomatic from prolapse, and denies splinting to void.   Past Medical History: Patient  has a past medical history of Allergy, Cervical cancer screening (05/02/2017), Depression, Depression with anxiety, Diabetes mellitus type 2 in obese [facility], Diabetes mellitus [place] complication [place], History of chicken pox, Hyperlipidemia, Low back pain, Migraines, and Obesity (11/10/2016).   Past Surgical History: She  has a past surgical history that includes Incontinence surgery (2016); Abdominoplasty (2012); Mastopexy (2012); Laparoscopic gastric banding (2005); and Augmentation mammaplasty.   Medications: She has a current medication list which includes the following prescription(s): aspirin ec, atorvastatin, freestyle lite, botulinum toxin type a, bupropion er, cetirizine hcl, premarin, cyclobenzaprine, enalapril, escitalopram, freestyle lite, freestyle, lidocaine 23% - tetracaine 7% topical ointment (plasticized), minoxidil, prevident 5000 sensitive,  solifenacin, tirzepatide, tirzepatide, and ubrelvy, and the following Facility-Administered Medications: gadobenate dimeglumine and sodium chloride flush.   Allergies: Patient is allergic to erythromycin.   Social History: Patient  reports that she has never smoked. She has never used smokeless tobacco. She reports current alcohol use of about 5.0 standard drinks of alcohol per week. She reports that she does not use drugs.      OBJECTIVE     Physical Exam: Vitals:   01/31/23 0913  BP: 100/81  Pulse: 72   Gen: No apparent distress, A&O x 3.  Detailed Urogynecologic Evaluation:  Deferred.    ASSESSMENT AND PLAN    Amanda James a 52y.o. with:  1. Overactive bladder   2. SUI (stress urinary incontinence, female)    OAB - symptoms well controlled on vesicare '5mg'$  daily - Can try Biotene mouthwash for dry mouth - will refill meds when needed  2. SUI - we discussed that with the incomplete bladder emptying and straining at the end of the urodynamic study, she is not a good candidate for anti-incontinence procedure.  - Since she feels that the straining was not typical for her, we can repeat the UDS or have her self cath after voiding for 2-3 days at home to get more accurate measures of PVR. Her initial uroflow void and her PVR at her initial visit were both normal, so this supports that the test was not typical for her.  - She does not feel she wants to proceed with any treatment right for for the SUI.   Return 1 year or sooner if needed  MJaquita Folds MD  Time spent: I spent 20 minutes dedicated to the care of this patient on the date of this encounter to include pre-visit review of records, face-to-face time with the patient and post visit documentation.

## 2023-02-03 ENCOUNTER — Other Ambulatory Visit (HOSPITAL_COMMUNITY): Payer: Self-pay

## 2023-02-03 ENCOUNTER — Encounter: Payer: Self-pay | Admitting: Plastic Surgery

## 2023-02-03 ENCOUNTER — Other Ambulatory Visit: Payer: Self-pay

## 2023-02-04 ENCOUNTER — Other Ambulatory Visit (HOSPITAL_COMMUNITY): Payer: Self-pay

## 2023-02-05 ENCOUNTER — Other Ambulatory Visit (HOSPITAL_COMMUNITY): Payer: Self-pay

## 2023-02-05 ENCOUNTER — Other Ambulatory Visit: Payer: Self-pay

## 2023-02-07 ENCOUNTER — Ambulatory Visit (INDEPENDENT_AMBULATORY_CARE_PROVIDER_SITE_OTHER): Payer: 59 | Admitting: Plastic Surgery

## 2023-02-07 ENCOUNTER — Encounter: Payer: Self-pay | Admitting: Plastic Surgery

## 2023-02-07 ENCOUNTER — Ambulatory Visit (INDEPENDENT_AMBULATORY_CARE_PROVIDER_SITE_OTHER): Payer: 59 | Admitting: Neurology

## 2023-02-07 DIAGNOSIS — G43709 Chronic migraine without aura, not intractable, without status migrainosus: Secondary | ICD-10-CM | POA: Diagnosis not present

## 2023-02-07 DIAGNOSIS — Z719 Counseling, unspecified: Secondary | ICD-10-CM

## 2023-02-07 MED ORDER — ONABOTULINUMTOXINA 200 UNITS IJ SOLR
155.0000 [IU] | Freq: Once | INTRAMUSCULAR | Status: AC
  Administered 2023-02-07: 155 [IU] via INTRAMUSCULAR

## 2023-02-07 NOTE — Progress Notes (Unsigned)
Consent Form Botulism Toxin Injection For Chronic Migraine  11/15/2022: stable  08/21/2022: At baseline had >25 migraine days a month. Now only has 5 migraine days a month and Less than 10 total headache days a month, great improvement. Has tried and failed nurtec, imitrex, maxalt, zomig, frovatriptan and uses ubrelvy acutely with great sucess  Meds ordered this encounter  Medications   botulinum toxin Type A (BOTOX) injection 155 Units    Botox- 200 units x 1 vial Lot: [id number] Expiration: 05/2025 NDC: [id number]   Bacteriostatic 0.9% Sodium Chloride- 44m total Lot: [id number]: 11/25 NDC: [id number]  Dx: [id number]    03/26/2021: Fantastic response> 70% improvement migraine freq and severity.    Reviewed orally with patient, additionally signature is on file:  Botulism toxin has been approved by the Federal drug administration for treatment of chronic migraine. Botulism toxin does not cure chronic migraine and it may not be effective in some patients.  The administration of botulism toxin is accomplished by injecting a small amount of toxin into the muscles of the neck and head. Dosage must be titrated for each individual. Any benefits resulting from botulism toxin tend to wear off after 3 months with a repeat injection required if benefit is to be maintained. Injections are usually done every 3-4 months with maximum effect peak achieved by about 2 or 3 weeks. Botulism toxin is expensive and you should be sure of what costs you will incur resulting from the injection.  The side effects of botulism toxin use for chronic migraine may include:   -Transient, and usually mild, facial weakness with facial injections  -Transient, and usually mild, head or neck weakness with head/neck injections  -Reduction or loss of forehead facial animation due to forehead muscle weakness  -Eyelid drooping  -Dry eye  -Pain at the site of injection or bruising at the site of  injection  -Double vision  -Potential unknown long term risks  Contraindications: You should not have Botox if you are pregnant, nursing, allergic to albumin, have an infection, skin condition, or muscle weakness at the site of the injection, or have myasthenia gravis, Lambert-Eaton syndrome, or ALS.  It is also possible that as with any injection, there may be an allergic reaction or no effect from the medication. Reduced effectiveness after repeated injections is sometimes seen and rarely infection at the injection site may occur. All care will be taken to prevent these side effects. If therapy is given over a long time, atrophy and wasting in the muscle injected may occur. Occasionally the patient's become refractory to treatment because they develop antibodies to the toxin. In this event, therapy needs to be modified.  I have read the above information and consent to the administration of botulism toxin.    BOTOX PROCEDURE NOTE FOR MIGRAINE HEADACHE    Contraindications and precautions discussed with patient(above). Aseptic procedure was observed and patient tolerated procedure. Procedure performed by Dr. TGeorgia Dom The condition has existed for more than 6 months, and pt does not have a diagnosis of ALS, Myasthenia Gravis or Lambert-Eaton Syndrome.  Risks and benefits of injections discussed and pt agrees to proceed with the procedure.  Written consent obtained  These injections are medically necessary. Pt  receives good benefits from these injections. These injections do not cause sedations or hallucinations which the oral therapies may cause.  Description of procedure:  The patient was placed in a sitting position. The standard protocol was used for Botox as follows, with 5  units of Botox injected at each site:   -Procerus muscle, midline injection  -Corrugator muscle, bilateral injection  -Frontalis muscle, bilateral injection, with 2 sites each side, medial injection was  performed in the upper one third of the frontalis muscle, in the region vertical from the medial inferior edge of the superior orbital rim. The lateral injection was again in the upper one third of the forehead vertically above the lateral limbus of the cornea, 1.5 cm lateral to the medial injection site.  -Temporalis muscle injection, 4 sites, bilaterally. The first injection was 3 cm above the tragus of the ear, second injection site was 1.5 cm to 3 cm up from the first injection site in line with the tragus of the ear. The third injection site was 1.5-3 cm forward between the first 2 injection sites. The fourth injection site was 1.5 cm posterior to the second injection site.   -Occipitalis muscle injection, 3 sites, bilaterally. The first injection was done one half way between the occipital protuberance and the tip of the mastoid process behind the ear. The second injection site was done lateral and superior to the first, 1 fingerbreadth from the first injection. The third injection site was 1 fingerbreadth superiorly and medially from the first injection site.  -Cervical paraspinal muscle injection, 2 sites, bilateral knee first injection site was 1 cm from the midline of the cervical spine, 3 cm inferior to the lower border of the occipital protuberance. The second injection site was 1.5 cm superiorly and laterally to the first injection site.  -Trapezius muscle injection was performed at 3 sites, bilaterally. The first injection site was in the upper trapezius muscle halfway between the inflection point of the neck, and the acromion. The second injection site was one half way between the acromion and the first injection site. The third injection was done between the first injection site and the inflection point of the neck.   Will return for repeat injection in 3 months.   155 units of Botox was used, 45U Botox not injected was wasted. The patient tolerated the procedure well, there were no  complications of the above procedure.

## 2023-02-07 NOTE — Progress Notes (Unsigned)
Botox- 200 units x 1 vial Lot: [id number] Expiration:05/2025 NDC: 0023-3921-02  Bacteriostatic 0.9% Sodium Chloride- 4mL total Lot: [id number] Expiration: 11/25 NDC: 63323-924-03  Dx: [id number] S/P 

## 2023-02-07 NOTE — Progress Notes (Addendum)
Kybella Procedure Note  Procedure: Cosmetic deoxycholic acid injection (2 vials were used)  Pre-operative Diagnosis: Submental fullness  Post-operative Diagnosis: Same  Complications:  None  Brief history: The patient desires improvement in the appearance of moderate to severe convexity or fullness associated with submental fat. I discussed with the patient this proposed procedure of Kybella, which is customized depending on the particular needs of the patient. It is performed on the submental area for reduction in the fat.  The alternatives were discussed with the patient. The risks were addressed including bleeding, scarring, infection, damage to deeper structures, asymmetry, numbness, formation of areas of hardness, swelling, nodules, skin ulceration, headache, alopecia, difficulty swallowing, and muscle weakness. Additionally, marginal mandibular nerve injury could occur and is manifested as an asymmetric smile or facial muscle weakness.  The individual's choice to undergo a surgical procedure is based on the comparison of risks to potential benefits. Injections do not arrest the aging process or produce permanent tightening of the skin.  Operative intervention maybe necessary to maintain the results. The patient understands and wishes to proceed. An informed consent was signed and informational brochures given to her prior to the procedure.  Procedure: After removing the numbing cream, the area was prepped with alcohol and dried with a clean gauze. Using a clean technique, the submental area was palpated and pinched.  The skin at the area was pulled and there was appropriate elasticity noted without excessive skin laxity. The patient was asked to tense the platysma to define the subcutaneous fat between the dermis and platysma.  A skin marker was used to mark the anterior, posterior and lateral borders of the submental fat compartment. The "no treatment Zone" was marked. The treatment zone was  injected in the subcutaneous layer. The grid was placed on the skin with the water over it for activation of the ink.  The clear protective sheet was removed leaving the markings.  The dots outside the previously marked treatment zone were removed with an alcohol wipe.  The pre-platysmal fat was pinched with 2 fingers.  Each dot was injected perpendicular to the skin with .2 cc of Kybella using a 1 ml syring and a 30 gauge needle. The ink was wiped off the skin with an alcohol wipe and a cold pack was applied to the treatment area. No complications were noted. Light pressure with an ice pack was held for 5 minutes. She was instructed explicitly in post-operative care including no massage, heavy activity, work out or facial for 24 hours.  Deoxycholic Acid LOT:  [id number] D EXP:  Mar 2024

## 2023-02-09 ENCOUNTER — Encounter: Payer: Self-pay | Admitting: Family Medicine

## 2023-02-09 NOTE — Assessment & Plan Note (Signed)
Following with neurology and doing well on current meds

## 2023-02-09 NOTE — Assessment & Plan Note (Signed)
hgba1c acceptable, minimize simple carbs. Increase exercise as tolerated. Continue current meds 

## 2023-02-09 NOTE — Assessment & Plan Note (Signed)
Tolerating statin, encouraged heart healthy diet, avoid trans fats, minimize simple carbs and saturated fats. Increase exercise as tolerated 

## 2023-02-09 NOTE — Assessment & Plan Note (Signed)
Supplement and monitor 

## 2023-02-10 ENCOUNTER — Telehealth (INDEPENDENT_AMBULATORY_CARE_PROVIDER_SITE_OTHER): Payer: 59 | Admitting: Family Medicine

## 2023-02-10 ENCOUNTER — Other Ambulatory Visit: Payer: Self-pay

## 2023-02-10 ENCOUNTER — Encounter: Payer: Self-pay | Admitting: Gastroenterology

## 2023-02-10 DIAGNOSIS — E669 Obesity, unspecified: Secondary | ICD-10-CM

## 2023-02-10 DIAGNOSIS — E782 Mixed hyperlipidemia: Secondary | ICD-10-CM

## 2023-02-10 DIAGNOSIS — F32A Depression, unspecified: Secondary | ICD-10-CM | POA: Diagnosis not present

## 2023-02-10 DIAGNOSIS — G43709 Chronic migraine without aura, not intractable, without status migrainosus: Secondary | ICD-10-CM

## 2023-02-10 DIAGNOSIS — Z1211 Encounter for screening for malignant neoplasm of colon: Secondary | ICD-10-CM | POA: Diagnosis not present

## 2023-02-10 DIAGNOSIS — E559 Vitamin D deficiency, unspecified: Secondary | ICD-10-CM

## 2023-02-10 DIAGNOSIS — F419 Anxiety disorder, unspecified: Secondary | ICD-10-CM

## 2023-02-10 DIAGNOSIS — Z0000 Encounter for general adult medical examination without abnormal findings: Secondary | ICD-10-CM | POA: Diagnosis not present

## 2023-02-10 DIAGNOSIS — E1169 Type 2 diabetes mellitus with other specified complication: Secondary | ICD-10-CM

## 2023-02-10 NOTE — Progress Notes (Signed)
MyChart Video Visit    Virtual Visit via Video Note   This visit type was conducted due to national recommendations for restrictions regarding the COVID-19 Pandemic (e.g. social distancing) in an effort to limit this patient's exposure and mitigate transmission in our community. This patient is at least at moderate risk for complications without adequate follow up. This format is felt to be most appropriate for this patient at this time. Physical exam was limited by quality of the video and audio technology used for the visit. *** was able to get the patient set up on a video visit.  Patient location: Home Patient and provider in visit Provider location: Office  I discussed the limitations of evaluation and management by telemedicine and the availability of in person appointments. The patient expressed understanding and agreed to proceed.  Visit Date: 02/10/2023  Today's healthcare provider: Penni Homans, MD     Subjective:    Patient ID: Amanda James, female    DOB: [birth year], 52 y.o.   MRN: [id number]  No chief complaint on file.   HPI Patient is in today for a video visit.   Urine incontinence She recently had an appointment with urogynecology. She is taking vesicare daily PO to manage urine incontinence. She is still experiencing some stress incontinence, but less than previously   Rheumatology She complains of intermittent back pain, left hip pain, and occasional right hip pain. She is scheduling an appointment with rheumatology.  Skin Her father has a history of psoriasis. 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   Mammogram Last completed on 01/21/2023. Results are normal. Repeat in 1 year   Colonoscopy   Last completed on 02/08/2013. Results are normal. Repeat in 10 years.   Pap Smear Last pap smear completed on 07/03/2020. Results are normal. Repeat in 3 years.   Advanced Directives Discussed advanced directives during this visit.  Immunizations She is not UTD on  the shingles immunization.     Past Medical History:  Diagnosis Date   Allergy    Cervical cancer screening 05/02/2017   Depression    Depression with anxiety    Diabetes mellitus type 2 in obese [facility])    Diabetes mellitus without complication ([place])    History of chicken pox    Hyperlipidemia    Low back pain    Migraines    Obesity 11/10/2016    Past Surgical History:  Procedure Laterality Date   ABDOMINOPLASTY  2012   AUGMENTATION MAMMAPLASTY     maxilift with implants    INCONTINENCE SURGERY  2016   LAPAROSCOPIC GASTRIC BANDING  2005   esophageal dilation. band still present but released   MASTOPEXY  2012    Family History  Problem Relation Age of Onset   Diabetes Mother    Hypertension Mother    Hyperlipidemia Mother    Obesity Mother    Heart disease Mother        pacer   Diabetes Father    Heart disease Father        MI   Hyperlipidemia Father    Hypertension Father    Obesity Father    Diabetes Sister    Hyperlipidemia Sister    Hypertension Sister    Obesity Sister    Obesity Maternal Grandmother    Hypertension Maternal Grandmother    Hyperlipidemia Maternal Grandmother    Diabetes Maternal Grandmother    Rosacea Maternal Grandmother    Non-Hodgkin's lymphoma Maternal Grandmother    Arthritis Maternal Grandmother  spinal stenosis   Multiple myeloma Maternal Grandfather    Hyperlipidemia Paternal Grandmother        rhabdo from statins   Kidney disease Paternal Grandmother    Migraines Neg Hx     Social History   Socioeconomic History   Marital status: Married    Spouse name: Not on file   Number of children: 1   Years of education: MD   Highest education level: Not on file  Occupational History   Occupation: [place]  Tobacco Use   Smoking status: Never   Smokeless tobacco: Never  Vaping Use   Vaping Use: Never used  Substance and Sexual Activity   Alcohol use: Yes    Alcohol/week: 5.0 standard drinks of alcohol    Types:  5 Glasses of wine per week    Comment: 3-5 glasses of wine per week   Drug use: No   Sexual activity: Yes  Other Topics Concern   Not on file  Social History Narrative   Works With [employer], lives with husband, 3 cats   No major dietary restrictions. Exercise 30 minutes 3 x a week   Seat belts routinely   Right-handed   Caffeine: 1-2 cups of coffee per day   Social Determinants of Health   Financial Resource Strain: Not on file  Food Insecurity: Not on file  Transportation Needs: Not on file  Physical Activity: Not on file  Stress: Not on file  Social Connections: Not on file  Intimate Partner Violence: Not on file    Outpatient Medications Prior to Visit  Medication Sig Dispense Refill   aspirin EC 81 MG tablet Take 1 tablet (81 mg total) by mouth daily. Swallow whole. 90 tablet 3   atorvastatin (LIPITOR) 20 MG tablet Take 1 tablet (20 mg total) by mouth daily. 90 tablet 1   Blood Glucose Monitoring Suppl (FREESTYLE LITE) w/Device KIT Use to test blood sugar 4 times a day 1 kit 0   botulinum toxin Type A (BOTOX) 200 units injection Inject 155 Units into the muscle every 3 (three) months. 1 each 1   buPROPion ER (WELLBUTRIN SR) 100 MG 12 hr tablet Take 1 tablet (100 mg total) by mouth 2 (two) times daily. 180 tablet 1   Cetirizine HCl 10 MG CAPS Take 1 capsule by mouth daily.     conjugated estrogens (PREMARIN) vaginal cream Place 1 applicatorful vaginally 2 times a week. 30 g 1   cyclobenzaprine (FLEXERIL) 10 MG tablet TAKE 1 TABLET BY MOUTH 3 TIMES DAILY AS NEEDED FOR MUSCLE SPASM 30 tablet 2   enalapril (VASOTEC) 2.5 MG tablet Take 1 tablet (2.5 mg total) by mouth daily. 90 tablet 1   escitalopram (LEXAPRO) 20 MG tablet Take 1 tablet (20 mg total) by mouth every morning. 90 tablet 1   glucose blood (FREESTYLE LITE) test strip Use to test blood sugar 4 times a day 100 each 12   Lancets (FREESTYLE) lancets Use to test blood sugar 4 times a day 100 each 0   minoxidil (LONITEN) 2.5  MG tablet Take 0.5 tablets (1.25 mg total) by mouth daily.     Sod Fluoride-Potassium Nitrate (PREVIDENT 5000 SENSITIVE) 1.1-5 % GEL Brush on teeth 2 times a day for 2 minutes each time then spit as much as possible then DO NOT swish, eat or drink for 30 minutes 100 mL 12   solifenacin (VESICARE) 5 MG tablet Take 1 tablet (5 mg total) by mouth daily. 30 tablet 5  tirzepatide (MOUNJARO) 12.5 MG/0.5ML Pen Inject 12.5 mg into the skin once a week. 6 mL 1   tirzepatide (MOUNJARO) 15 MG/0.5ML Pen Inject 15 mg into the skin once a week. 6 mL 5   Ubrogepant (UBRELVY) 100 MG TABS Take 1 tablet by mouth at onset of migraine as needed as directed. May repeat once in 2 hours if headache persists. Maximum 2 tablets per day. 16 tablet 11   Facility-Administered Medications Prior to Visit  Medication Dose Route Frequency Provider Last Rate Last Admin   botulinum toxin Type A (BOTOX) injection 155 Units  155 Units Intramuscular Once Melvenia Beam, MD       gadobenate dimeglumine (MULTIHANCE) injection 20 mL  20 mL Intravenous Once PRN Melvenia Beam, MD       sodium chloride flush (NS) 0.9 % injection 10 mL  10 mL Intravenous PRN Melvenia Beam, MD   20 mL at 10/26/20 1345    Allergies  Allergen Reactions   Erythromycin Diarrhea    Review of Systems  Gastrointestinal:        (+) urinary incontinence   Genitourinary:        (+) urinary incontinence  Musculoskeletal:  Positive for back pain.       (+) left hip pain  (+) occasional right hip pain  Skin:        Psoriasis on nails       Objective:    Physical Exam  There were no vitals taken for this visit. Wt Readings from Last 3 Encounters:  01/03/23 201 lb (91.2 kg)  11/15/22 200 lb (90.7 kg)  10/25/22 199 lb 9.6 oz (90.5 kg)       Assessment & Plan:  Chronic migraine w/o aura w/o status migrainosus, not intractable Assessment & Plan: Following with neurology and doing well on current meds   Diabetes mellitus type 2 in obese  [facility]) Assessment & Plan: hgba1c acceptable, minimize simple carbs. Increase exercise as tolerated. Continue current meds    Mixed hyperlipidemia Assessment & Plan: Tolerating statin, encouraged heart healthy diet, avoid trans fats, minimize simple carbs and saturated fats. Increase exercise as tolerated    Vitamin D deficiency Assessment & Plan: Supplement and monitor       I discussed the assessment and treatment plan with the patient. The patient was provided an opportunity to ask questions and all were answered. The patient agreed with the plan and demonstrated an understanding of the instructions.   The patient was advised to call back or seek an in-person evaluation if the symptoms worsen or if the condition fails to improve as anticipated.  Penni Homans, MD [facility] [place] (phone) [phone] (fax)  [place] Lynn,acting as a scribe for Penni Homans, MD.,have documented all relevant documentation on the behalf of Penni Homans, MD,as directed by  Penni Homans, MD while in the presence of Penni Homans, MD.

## 2023-02-10 NOTE — Telephone Encounter (Signed)
Appt changed to virtual.

## 2023-02-11 ENCOUNTER — Other Ambulatory Visit (HOSPITAL_COMMUNITY): Payer: Self-pay

## 2023-02-11 ENCOUNTER — Other Ambulatory Visit: Payer: Self-pay

## 2023-02-12 NOTE — Assessment & Plan Note (Signed)
Referred for screening colonoscopy 

## 2023-02-12 NOTE — Assessment & Plan Note (Signed)
Wellbutrin SR 100 mg bid doing well

## 2023-02-13 ENCOUNTER — Other Ambulatory Visit (HOSPITAL_COMMUNITY): Payer: Self-pay

## 2023-02-20 ENCOUNTER — Other Ambulatory Visit (HOSPITAL_COMMUNITY): Payer: Self-pay

## 2023-02-21 ENCOUNTER — Ambulatory Visit: Payer: 59 | Attending: Obstetrics and Gynecology | Admitting: Physical Therapy

## 2023-02-21 ENCOUNTER — Other Ambulatory Visit: Payer: Self-pay

## 2023-02-21 ENCOUNTER — Encounter: Payer: Self-pay | Admitting: Physical Therapy

## 2023-02-21 DIAGNOSIS — R279 Unspecified lack of coordination: Secondary | ICD-10-CM | POA: Diagnosis not present

## 2023-02-21 DIAGNOSIS — M6281 Muscle weakness (generalized): Secondary | ICD-10-CM | POA: Diagnosis not present

## 2023-02-21 DIAGNOSIS — R293 Abnormal posture: Secondary | ICD-10-CM | POA: Diagnosis not present

## 2023-02-21 NOTE — Therapy (Signed)
OUTPATIENT PHYSICAL THERAPY FEMALE PELVIC EVALUATION   Patient Name: Amanda James MRN: [id number] DOB:02/25/1971, 52 y.o., female Today's Date: 02/21/2023  END OF SESSION:  PT End of Session - 02/21/23 0854     Visit Number 1    Date for PT Re-Evaluation 05/16/23    Authorization Type [place]    PT Start Time [id number]    PT Stop Time 0922    PT Time Calculation (min) 35 min    Activity Tolerance Patient tolerated treatment well    Behavior During Therapy [facility] for tasks assessed/performed             Past Medical History:  Diagnosis Date   Allergy    Cervical cancer screening 05/02/2017   Depression    Depression with anxiety    Diabetes mellitus type 2 in obese ([place])    Diabetes mellitus without complication ([place])    History of chicken pox    Hyperlipidemia    Low back pain    Migraines    Obesity 11/10/2016   Past Surgical History:  Procedure Laterality Date   ABDOMINOPLASTY  2012   AUGMENTATION MAMMAPLASTY     maxilift with implants    INCONTINENCE SURGERY  2016   LAPAROSCOPIC GASTRIC BANDING  2005   esophageal dilation. band still present but released   MASTOPEXY  2012   Patient Active Problem List   Diagnosis Date Noted   Pitting of nails 12/30/2022   Left hip pain 12/30/2022   Encounter for counseling 10/25/2022   Urinary incontinence 08/07/2022   Pneumonia due to infectious organism 08/07/2022   Perimenopausal disorder 08/07/2022   Atrophic vaginitis 08/07/2022   PFO (patent foramen ovale) 02/11/2021   RLS (restless legs syndrome) 02/11/2021   CAD (coronary artery disease) 02/11/2021   Rosacea 10/13/2020   Class 1 obesity due to excess calories with serious comorbidity and body mass index (BMI) of 34.0 to 34.9 in adult 09/11/2020   Loud snoring 09/11/2020   Sleep-related headache 09/11/2020   Recurrent UTI 12/26/2019   Shoulder pain, left 12/26/2019   Low back pain 06/28/2018   Cervical cancer screening 05/02/2017   Chronic migraine w/o aura  w/o status migrainosus, not intractable 04/06/2017   Obesity 11/10/2016   Vitamin D deficiency 11/10/2016   Preventative health care 11/10/2016   History of chicken pox    Allergy    Anxiety and depression    Diabetes mellitus type 2 in obese ([place])    Hyperlipidemia     PCP: Mosie Lukes, MD  REFERRING PROVIDER: Jaquita Folds, MD  REFERRING DIAG: R15.9 (ICD-10-CM) - Incontinence of feces, unspecified fecal incontinence type N39.3 (ICD-10-CM) - SUI (stress urinary incontinence, female) N32.81 (ICD-10-CM) - Overactive bladder  THERAPY DIAG:  Muscle weakness (generalized)  Unspecified lack of coordination  Abnormal posture  Rationale for Evaluation and Treatment: Rehabilitation  ONSET DATE: bladder issues for years and had sling 8 years ago, last year urge flared up  SUBJECTIVE:  SUBJECTIVE STATEMENT: Urge incontinence usually bathroom first thing in the morning, and showering.  Vesicare for urge incontinence has made a huge difference Fluid intake: Yes: 80 oz water    PAIN:  Are you having pain? No  PRECAUTIONS: None  WEIGHT BEARING RESTRICTIONS: No  FALLS:  Has patient fallen in last 6 months? No  LIVING ENVIRONMENT: Lives with: lives with their family and lives with their spouse Lives in: House/apartment   OCCUPATION:  Physician with healthy weight wellness  PLOF: Independent  PATIENT GOALS: stop urge incontince  PERTINENT HISTORY:  Left hip pain, back pain, UI, perimenopause, vaginitis/atrophy, recurrent UTI Sexual abuse: No  BOWEL MOVEMENT: Pain with bowel movement: No Type of bowel movement:Type (Bristol Stool Scale) taking mirilax, Frequency daily, and Strain No Fully empty rectum: Yes: but do have to wipe due to softer stool Leakage: Yes: rarely just a  small amount that is seen on toileting paper when wiping Pads:  Fiber supplement:   URINATION: Pain with urination: No Fully empty bladder: Yes: a little strain at the end Stream:  mixed - sometimes has to push a little Urgency: Yes: a little in the morning at this point Frequency: 3-4 hours Leakage: Urge to void, Walking to the bathroom, Coughing, and Sneezing Pads: Yes: panty liner  INTERCOURSE: Pain with intercourse:  no concerns   PREGNANCY: Vaginal deliveries 1 Tearing Yes: level 3   PROLAPSE: None   OBJECTIVE:   DIAGNOSTIC FINDINGS:    PATIENT SURVEYS:    PFIQ-7   COGNITION: Overall cognitive status: Within functional limits for tasks assessed     SENSATION: Light touch: Appears intact Proprioception: Appears intact  MUSCLE LENGTH Hamstrings: WFL  Thomas test:   LUMBAR SPECIAL TESTS:  Straight leg raise test: Negative ASLR harder on left and improved with lumar compression  FUNCTIONAL TESTS:  Single leg - left more unsteady and trendelenburg; right side mild trendelenburg  GAIT:  Comments: WFL   POSTURE: increased lumbar lordosis, increased thoracic kyphosis, anterior pelvic tilt, and flexed trunk   PELVIC ALIGNMENT: mild posterior on left  LUMBARAROM/PROM:  A/PROM A/PROM  eval  Flexion 75%  Extension   Right lateral flexion   Left lateral flexion   Right rotation   Left rotation    (Blank rows = not tested)  LOWER EXTREMITY ROM:  Passive ROM Right eval Left eval  Hip flexion    Hip extension    Hip abduction    Hip adduction    Hip internal rotation    Hip external rotation    Knee flexion    Knee extension    Ankle dorsiflexion    Ankle plantarflexion    Ankle inversion    Ankle eversion     (Blank rows = not tested)  LOWER EXTREMITY MMT:  MMT Right eval Left eval  Hip flexion    Hip extension    Hip abduction    Hip adduction    Hip internal rotation    Hip external rotation    Knee flexion    Knee  extension    Ankle dorsiflexion    Ankle plantarflexion    Ankle inversion    Ankle eversion     PALPATION:   General  tension in hamstrings and low back, decreased core strength and breath hold used to brace                External Perineal Exam normal appearance, descended perineal body  Internal Pelvic Floor slightly higher tone with posterior circular contraction decreased closure likely due to muscle tearing from previous vaginal delivery, breath holding with attempt to perform kegel  Patient confirms identification and approves PT to assess internal pelvic floor and treatment Yes  PELVIC MMT:   MMT eval  Vaginal 2/5 MMT, 1 rep then decreased strength for 4 more, 1 rep holding for 7 sec then decreased strength for 13 more sec  Internal Anal Sphincter   External Anal Sphincter   Puborectalis   Diastasis Recti   (Blank rows = not tested)        TONE: Normal to slightly high in levators - not TTP  PROLAPSE: Mild anterior/posterior walls  TODAY'S TREATMENT:                                                                                                                              DATE: 02/21/23  EVAL and initial HEP with education on urge and breathing Access Code: [email or web address] URL: [email or web address] Date: 02/21/2023 Prepared by: Jari Favre  Exercises - Supine Diaphragmatic Breathing  - 3 x daily - 7 x weekly - 1 sets - 10 reps   PATIENT EDUCATION:  Education details: Access Code: [email or web address] Person educated: Patient Education method: Explanation, Demonstration, Tactile cues, Verbal cues, and Handouts Education comprehension: verbalized understanding and returned demonstration  HOME EXERCISE PROGRAM: Access Code: [email or web address] URL: [email or web address] Date: 02/21/2023 Prepared by: Jari Favre  Exercises - Supine Diaphragmatic Breathing  - 3 x daily - 7 x weekly - 1 sets - 10  reps  ASSESSMENT:  CLINICAL IMPRESSION: Patient is a 52 y.o. female who was seen today for physical therapy evaluation and treatment for urge incontinence. Pt has diminished pelvic floor strength with history of significant tear during vaginal delivery. Pt has history of bladder surgery to repair prolapse and is currently going through menopause.  Weakness, lack of coordination with breath holding, and tension as noted above.  Pt has decreased endurance with reps and holding as well.  Pt will benefit from skilled PT to address all above mentioned impairments  OBJECTIVE IMPAIRMENTS: decreased coordination, decreased endurance, decreased ROM, decreased strength, increased muscle spasms, impaired flexibility, postural dysfunction, and pain.   ACTIVITY LIMITATIONS: continence and toileting  PARTICIPATION LIMITATIONS: community activity  PERSONAL FACTORS: 3+ comorbidities: Left hip pain, back pain, UI, perimenopause, vaginitis/atrophy, recurrent UTI  are also affecting patient's functional outcome.   REHAB POTENTIAL: Excellent  CLINICAL DECISION MAKING: Evolving/moderate complexity  EVALUATION COMPLEXITY: Moderate   GOALS: Goals reviewed with patient? Yes  SHORT TERM GOALS: Target date: 03/20/23  Ind with initial HEP and diaphragm breathing Baseline: Goal status: INITIAL  2.  Pt will report 50% less urgency with walking to the bathroom in the morning Baseline:  Goal status: INITIAL  3.  Pt will be able to perform and sustain a pelvic floor kegel for at least 5 sec without breathe holding Baseline:  Goal status: INITIAL    LONG  TERM GOALS: Target date: 05/16/23  Pt will be independent with advanced HEP to maintain improvements made throughout therapy  Baseline:  Goal status: INITIAL  2.  Pt will report no leakage with urge to void due to improved strength Baseline:  Goal status: INITIAL  3.  Pt will demonstrate 3/5 MMT with 5 quick flicks for improved urge control Baseline:   Goal status: INITIAL  4.  Pt will report at least 25% low back pain due to imporved core strength and pressure management with breath coordination Baseline:  Goal status: INITIAL   PLAN:  PT FREQUENCY: 1x/week  PT DURATION: 12 weeks  PLANNED INTERVENTIONS: Therapeutic exercises, Therapeutic activity, Neuromuscular re-education, Balance training, Gait training, Patient/Family education, Self Care, Joint mobilization, Dry Needling, Electrical stimulation, Cryotherapy, Moist heat, Taping, Biofeedback, Manual therapy, and Re-evaluation  PLAN FOR NEXT SESSION: dry needling lumbar and work on core and breath coordination; anterior pelvic floor strength and isolation   [employer], PT 02/21/2023, 5:21 PM

## 2023-02-26 ENCOUNTER — Other Ambulatory Visit: Payer: Self-pay

## 2023-02-26 ENCOUNTER — Other Ambulatory Visit: Payer: Self-pay | Admitting: Family Medicine

## 2023-02-26 DIAGNOSIS — M545 Low back pain, unspecified: Secondary | ICD-10-CM

## 2023-02-26 MED ORDER — CYCLOBENZAPRINE HCL 10 MG PO TABS
ORAL_TABLET | ORAL | 3 refills | Status: DC
  Filled 2023-02-26: qty 30, 10d supply, fill #0
  Filled 2023-04-16: qty 30, 10d supply, fill #1
  Filled 2023-06-13: qty 30, 10d supply, fill #2
  Filled 2023-07-09: qty 30, 10d supply, fill #3

## 2023-02-27 ENCOUNTER — Other Ambulatory Visit: Payer: Self-pay

## 2023-02-27 ENCOUNTER — Other Ambulatory Visit (HOSPITAL_COMMUNITY): Payer: Self-pay

## 2023-03-03 ENCOUNTER — Encounter: Payer: Self-pay | Admitting: Gastroenterology

## 2023-03-03 ENCOUNTER — Ambulatory Visit (AMBULATORY_SURGERY_CENTER): Payer: 59 | Admitting: *Deleted

## 2023-03-03 ENCOUNTER — Other Ambulatory Visit (HOSPITAL_COMMUNITY): Payer: Self-pay

## 2023-03-03 VITALS — Ht 66.0 in | Wt 200.0 lb

## 2023-03-03 DIAGNOSIS — Z1211 Encounter for screening for malignant neoplasm of colon: Secondary | ICD-10-CM

## 2023-03-03 MED ORDER — NA SULFATE-K SULFATE-MG SULF 17.5-3.13-1.6 GM/177ML PO SOLN
1.0000 | Freq: Once | ORAL | 0 refills | Status: AC
  Filled 2023-03-03: qty 354, 1d supply, fill #0

## 2023-03-03 NOTE — Progress Notes (Signed)
No egg or soy allergy known to patient  No issues known to pt with past sedation with any surgeries or procedures Patient denies ever being told they had issues or difficulty with intubation  No FH of Malignant Hyperthermia Pt is not on diet pills Pt is not on  home 02  Pt is not on blood thinners  Pt denies issues with constipation  No A fib or A flutter Have any cardiac testing pending--no Pt instructed to use [email or web address] or GoodRx for a price reduction on prep   

## 2023-03-04 ENCOUNTER — Other Ambulatory Visit: Payer: Self-pay

## 2023-03-07 ENCOUNTER — Encounter: Payer: Self-pay | Admitting: Plastic Surgery

## 2023-03-07 ENCOUNTER — Ambulatory Visit (INDEPENDENT_AMBULATORY_CARE_PROVIDER_SITE_OTHER): Payer: Self-pay | Admitting: Plastic Surgery

## 2023-03-07 ENCOUNTER — Other Ambulatory Visit (HOSPITAL_COMMUNITY): Payer: Self-pay

## 2023-03-07 VITALS — BP 101/73 | HR 73

## 2023-03-07 DIAGNOSIS — Z719 Counseling, unspecified: Secondary | ICD-10-CM

## 2023-03-07 NOTE — Progress Notes (Signed)
Preoperative Dx: hyperpigmentation and redness of face  Postoperative Dx:  same  Procedure: laser to face   Anesthesia: none  Description of Procedure:  Risks and complications were explained to the patient. Consent was confirmed and signed. Eye protection was placed. Time out was called and all information was confirmed to be correct. The area  area was prepped with alcohol and wiped dry. The BBL laser was set at 515 and 560 nm at 7.5 J/cm2. The face was lasered. The patient tolerated the procedure well and there were no complications. The patient is to follow up in 4 weeks.

## 2023-03-10 ENCOUNTER — Telehealth: Payer: 59 | Admitting: Physician Assistant

## 2023-03-10 ENCOUNTER — Other Ambulatory Visit: Payer: Self-pay

## 2023-03-10 DIAGNOSIS — L309 Dermatitis, unspecified: Secondary | ICD-10-CM

## 2023-03-10 MED ORDER — BETAMETHASONE DIPROPIONATE 0.05 % EX CREA
TOPICAL_CREAM | Freq: Two times a day (BID) | CUTANEOUS | 0 refills | Status: AC
  Filled 2023-03-10: qty 45, 30d supply, fill #0

## 2023-03-10 NOTE — Progress Notes (Signed)

## 2023-03-11 ENCOUNTER — Encounter: Payer: Self-pay | Admitting: Family Medicine

## 2023-03-17 ENCOUNTER — Telehealth: Payer: Self-pay | Admitting: *Deleted

## 2023-03-17 NOTE — Telephone Encounter (Signed)
Toni Amend,  This pt is scheduled with Dr. Russella Dar on 4/15.  She has a PFO.  Thanks much,  Cathlyn Parsons

## 2023-03-17 NOTE — Telephone Encounter (Deleted)
Got it, Documented on appt notes that pt has a PFO.

## 2023-03-24 ENCOUNTER — Ambulatory Visit (AMBULATORY_SURGERY_CENTER): Payer: 59 | Admitting: Gastroenterology

## 2023-03-24 ENCOUNTER — Other Ambulatory Visit: Payer: Self-pay

## 2023-03-24 ENCOUNTER — Encounter: Payer: Self-pay | Admitting: Gastroenterology

## 2023-03-24 VITALS — BP 101/68 | HR 66 | Temp 98.3°F | Resp 16 | Ht 66.0 in | Wt 200.0 lb

## 2023-03-24 DIAGNOSIS — D124 Benign neoplasm of descending colon: Secondary | ICD-10-CM

## 2023-03-24 DIAGNOSIS — Z1211 Encounter for screening for malignant neoplasm of colon: Secondary | ICD-10-CM | POA: Diagnosis not present

## 2023-03-24 DIAGNOSIS — G2581 Restless legs syndrome: Secondary | ICD-10-CM | POA: Diagnosis not present

## 2023-03-24 DIAGNOSIS — D123 Benign neoplasm of transverse colon: Secondary | ICD-10-CM

## 2023-03-24 DIAGNOSIS — I2510 Atherosclerotic heart disease of native coronary artery without angina pectoris: Secondary | ICD-10-CM | POA: Diagnosis not present

## 2023-03-24 DIAGNOSIS — E119 Type 2 diabetes mellitus without complications: Secondary | ICD-10-CM | POA: Diagnosis not present

## 2023-03-24 MED ORDER — SODIUM CHLORIDE 0.9 % IV SOLN: 500.0000 mL | Freq: Once | INTRAVENOUS | Status: DC

## 2023-03-24 NOTE — Op Note (Signed)
[facility] Patient Name: Blaike Newburn Procedure Date: 03/24/2023 8:22 AM MRN: [id number] Endoscopist: Meryl Dare , MD, [phone] Age: 52 Referring MD:  Date of Birth: 06/27/1971 Gender: Female Account #: [id number] Procedure:                Colonoscopy Indications:              Screening for colorectal malignant neoplasm Medicines:                Monitored Anesthesia Care Procedure:                Pre-Anesthesia Assessment:                           - Prior to the procedure, a History and Physical                            was performed, and patient medications and                            allergies were reviewed. The patient's tolerance of                            previous anesthesia was also reviewed. The risks                            and benefits of the procedure and the sedation                            options and risks were discussed with the patient.                            All questions were answered, and informed consent                            was obtained. Prior Anticoagulants: The patient has                            taken no anticoagulant or antiplatelet agents. ASA                            Grade Assessment: III - A patient with severe                            systemic disease. After reviewing the risks and                            benefits, the patient was deemed in satisfactory                            condition to undergo the procedure.                           After obtaining informed consent, the colonoscope  was passed under direct vision. Throughout the                            procedure, the patient's blood pressure, pulse, and                            oxygen saturations were monitored continuously. The                            CF [id number] #[id number] was introduced through the anus                            and advanced to the the cecum, identified by                            appendiceal  orifice and ileocecal valve. The                            ileocecal valve, appendiceal orifice, and rectum                            were photographed. The quality of the bowel                            preparation was excellent. The colonoscopy was                            performed without difficulty. The patient tolerated                            the procedure well. Scope In: 8:34:04 AM Scope Out: 8:54:17 AM Scope Withdrawal Time: 0 hours 16 minutes 15 seconds  Total Procedure Duration: 0 hours 20 minutes 13 seconds  Findings:                 The perianal and digital rectal examinations were                            normal.                           Two sessile polyps were found in the descending                            colon and transverse colon. The polyps were 6 to 7                            mm in size. These polyps were removed with a cold                            snare. Resection and retrieval were complete.                           Many medium-mouthed and small-mouthed diverticula  were found in the left colon. There was no evidence                            of diverticular bleeding.                           The exam was otherwise without abnormality on                            direct and retroflexion views. Complications:            No immediate complications. Estimated blood loss:                            None. Estimated Blood Loss:     Estimated blood loss: none. Impression:               - Two 6 to 7 mm polyps in the descending colon and                            in the transverse colon, removed with a cold snare.                            Resected and retrieved.                           - Mild diverticulosis in the left colon.                           - The examination was otherwise normal on direct                            and retroflexion views. Recommendation:           - Repeat colonoscopy after studies are  complete for                            surveillance based on pathology results.                           - Patient has a contact number available for                            emergencies. The signs and symptoms of potential                            delayed complications were discussed with the                            patient. Return to normal activities tomorrow.                            Written discharge instructions were provided to the                            patient.                           -  Resume previous diet.                           - Continue present medications.                           - Await pathology results. Meryl Dare, MD 03/24/2023 8:58:40 AM This report has been signed electronically.

## 2023-03-24 NOTE — Patient Instructions (Signed)
Thank you for coming in to see [place] today. Resume your diet and medications today. Return to regular daily activities tomorrow. Biopsy results will be available in 1-2 weeks, with recommendation for future colonoscopy in about 2 weeks   YOU HAD AN ENDOSCOPIC PROCEDURE TODAY AT THE [facility]:   Refer to the procedure report that was given to you for any specific questions about what was found during the examination.  If the procedure report does not answer your questions, please call your gastroenterologist to clarify.  If you requested that your care partner not be given the details of your procedure findings, then the procedure report has been included in a sealed envelope for you to review at your convenience later.  YOU SHOULD EXPECT: Some feelings of bloating in the abdomen. Passage of more gas than usual.  Walking can help get rid of the air that was put into your GI tract during the procedure and reduce the bloating. If you had a lower endoscopy (such as a colonoscopy or flexible sigmoidoscopy) you may notice spotting of blood in your stool or on the toilet paper. If you underwent a bowel prep for your procedure, you may not have a normal bowel movement for a few days.  Please Note:  You might notice some irritation and congestion in your nose or some drainage.  This is from the oxygen used during your procedure.  There is no need for concern and it should clear up in a day or so.  SYMPTOMS TO REPORT IMMEDIATELY:  Following lower endoscopy (colonoscopy or flexible sigmoidoscopy):  Excessive amounts of blood in the stool  Significant tenderness or worsening of abdominal pains  Swelling of the abdomen that is new, acute  Fever of 100F or higher   For urgent or emergent issues, a gastroenterologist can be reached at any hour by calling [phone]. Do not use MyChart messaging for urgent concerns.    DIET:  We do recommend a small meal at first, but then you may proceed  to your regular diet.  Drink plenty of fluids but you should avoid alcoholic beverages for 24 hours.  ACTIVITY:  You should plan to take it easy for the rest of today and you should NOT DRIVE or use heavy machinery until tomorrow (because of the sedation medicines used during the test).    FOLLOW UP: Our staff will call the number listed on your records the next business day following your procedure.  We will call around 7:15- 8:00 am to check on you and address any questions or concerns that you may have regarding the information given to you following your procedure. If we do not reach you, we will leave a message.     If any biopsies were taken you will be contacted by phone or by letter within the next 1-3 weeks.  Please call us at [phone] if you have not heard about the biopsies in 3 weeks.    SIGNATURES/CONFIDENTIALITY: You and/or your care partner have signed paperwork which will be entered into your electronic medical record.  These signatures attest to the fact that that the information above on your After Visit Summary has been reviewed and is understood.  Full responsibility of the confidentiality of this discharge information lies with you and/or your care-partner.

## 2023-03-24 NOTE — Progress Notes (Signed)
Pt's states no medical or surgical changes since previsit or office visit. 

## 2023-03-24 NOTE — Progress Notes (Signed)
Pt resting comfortably. VSS. Airway intact. SBAR complete to RN. All questions answered.   

## 2023-03-24 NOTE — Progress Notes (Signed)
Called to room to assist during endoscopic procedure.  Patient ID and intended procedure confirmed with present staff. Received instructions for my participation in the procedure from the performing physician.  

## 2023-03-24 NOTE — Progress Notes (Signed)
History & Physical  Primary Care Physician:  Bradd Canary, MD Primary Gastroenterologist: Claudette Head, MD  Impression / Plan:  Average risk CRC screening for colonoscopy.  CHIEF COMPLAINT:  CRC screening   HPI: Amanda James is a 52 y.o. female average risk CRC screening for colonoscopy.    Past Medical History:  Diagnosis Date   Allergy    Arthritis    "back"   Cervical cancer screening 05/02/2017   Depression    Depression with anxiety    Diabetes mellitus type 2 in obese    Diabetes mellitus without complication    GERD (gastroesophageal reflux disease)    History of chicken pox    Hyperlipidemia    Low back pain    Migraines    Obesity 11/10/2016   PFO (patent foramen ovale)     Past Surgical History:  Procedure Laterality Date   ABDOMINOPLASTY  2012   AUGMENTATION MAMMAPLASTY     maxilift with implants    INCONTINENCE SURGERY  2016   LAPAROSCOPIC GASTRIC BANDING  2005   esophageal dilation. band still present but released   MASTOPEXY  2012    Prior to Admission medications   Medication Sig Start Date End Date Taking? Authorizing Provider  aspirin EC 81 MG tablet Take 1 tablet (81 mg total) by mouth daily. Swallow whole. 02/02/21  Yes Tonny Bollman, MD  atorvastatin (LIPITOR) 20 MG tablet Take 1 tablet (20 mg total) by mouth daily. 09/17/22 09/12/23 Yes Bradd Canary, MD  betamethasone dipropionate 0.05 % cream Apply topically 2 (two) times daily. 03/10/23  Yes Margaretann Loveless, PA-C  buPROPion ER [facility] SR) 100 MG 12 hr tablet Take 1 tablet (100 mg total) by mouth 2 (two) times daily. 11/14/22  Yes Bradd Canary, MD  Cetirizine HCl 10 MG CAPS Take 1 capsule by mouth daily.   Yes [provider]  conjugated estrogens (PREMARIN) vaginal cream Place 1 applicatorful vaginally 2 times a week. 01/20/23  Yes Bradd Canary, MD  cyclobenzaprine (FLEXERIL) 10 MG tablet TAKE 1 TABLET BY MOUTH 3 TIMES DAILY AS NEEDED FOR MUSCLE SPASM 02/26/23   Yes Bradd Canary, MD  enalapril (VASOTEC) 2.5 MG tablet Take 1 tablet (2.5 mg total) by mouth daily. 11/04/22  Yes Bradd Canary, MD  escitalopram (LEXAPRO) 20 MG tablet Take 1 tablet (20 mg total) by mouth every morning. 12/24/22  Yes Bradd Canary, MD  Fluticasone Propionate (FLONASE ALLERGY RELIEF NA) Place into the nose daily.   Yes [provider]  minoxidil (LONITEN) 2.5 MG tablet Take 0.5 tablets (1.25 mg total) by mouth daily. 08/05/22  Yes Bradd Canary, MD  Sod Fluoride-Potassium Nitrate (PREVIDENT 5000 SENSITIVE) 1.1-5 % GEL Brush on teeth 2 times a day for 2 minutes each time then spit as much as possible then DO NOT swish, eat or drink for 30 minutes 08/05/22  Yes   solifenacin (VESICARE) 5 MG tablet Take 1 tablet (5 mg total) by mouth daily. 01/03/23  Yes Marguerita Beards, MD  Ubrogepant (UBRELVY) 100 MG TABS Take 1 tablet by mouth at onset of migraine as needed as directed. May repeat once in 2 hours if headache persists. Maximum 2 tablets per day. 01/02/23  Yes Anson Fret, MD  Blood Glucose Monitoring Suppl (FREESTYLE LITE) w/Device KIT Use to test blood sugar 4 times a day 11/14/21     botulinum toxin Type A (BOTOX) 200 units injection Inject 155 Units into the muscle  every 3 (three) months. 01/28/23   Naomie Dean B, MD  glucose blood (FREESTYLE LITE) test strip Use to test blood sugar 4 times a day 07/26/22   Bradd Canary, MD  Lancets (FREESTYLE) lancets Use to test blood sugar 4 times a day 06/06/22   Bradd Canary, MD  tirzepatide [facility]) 12.5 MG/0.5ML Pen Inject 12.5 mg into the skin once a week. 11/25/22   Bradd Canary, MD  tirzepatide [facility]) 15 MG/0.5ML Pen Inject 15 mg into the skin once a week. 07/23/22   Bradd Canary, MD    Current Outpatient Medications  Medication Sig Dispense Refill   aspirin EC 81 MG tablet Take 1 tablet (81 mg total) by mouth daily. Swallow whole. 90 tablet 3   atorvastatin (LIPITOR) 20 MG tablet Take 1 tablet  (20 mg total) by mouth daily. 90 tablet 1   betamethasone dipropionate 0.05 % cream Apply topically 2 (two) times daily. 45 g 0   buPROPion ER (WELLBUTRIN SR) 100 MG 12 hr tablet Take 1 tablet (100 mg total) by mouth 2 (two) times daily. 180 tablet 1   Cetirizine HCl 10 MG CAPS Take 1 capsule by mouth daily.     conjugated estrogens (PREMARIN) vaginal cream Place 1 applicatorful vaginally 2 times a week. 30 g 1   cyclobenzaprine (FLEXERIL) 10 MG tablet TAKE 1 TABLET BY MOUTH 3 TIMES DAILY AS NEEDED FOR MUSCLE SPASM 30 tablet 3   enalapril (VASOTEC) 2.5 MG tablet Take 1 tablet (2.5 mg total) by mouth daily. 90 tablet 1   escitalopram (LEXAPRO) 20 MG tablet Take 1 tablet (20 mg total) by mouth every morning. 90 tablet 1   Fluticasone Propionate (FLONASE ALLERGY RELIEF NA) Place into the nose daily.     minoxidil (LONITEN) 2.5 MG tablet Take 0.5 tablets (1.25 mg total) by mouth daily.     Sod Fluoride-Potassium Nitrate (PREVIDENT 5000 SENSITIVE) 1.1-5 % GEL Brush on teeth 2 times a day for 2 minutes each time then spit as much as possible then DO NOT swish, eat or drink for 30 minutes 100 mL 12   solifenacin (VESICARE) 5 MG tablet Take 1 tablet (5 mg total) by mouth daily. 30 tablet 5   Ubrogepant (UBRELVY) 100 MG TABS Take 1 tablet by mouth at onset of migraine as needed as directed. May repeat once in 2 hours if headache persists. Maximum 2 tablets per day. 16 tablet 11   Blood Glucose Monitoring Suppl (FREESTYLE LITE) w/Device KIT Use to test blood sugar 4 times a day 1 kit 0   botulinum toxin Type A (BOTOX) 200 units injection Inject 155 Units into the muscle every 3 (three) months. 1 each 1   glucose blood (FREESTYLE LITE) test strip Use to test blood sugar 4 times a day 100 each 12   Lancets (FREESTYLE) lancets Use to test blood sugar 4 times a day 100 each 0   tirzepatide (MOUNJARO) 12.5 MG/0.5ML Pen Inject 12.5 mg into the skin once a week. 6 mL 1   tirzepatide (MOUNJARO) 15 MG/0.5ML Pen  Inject 15 mg into the skin once a week. 6 mL 5   Current Facility-Administered Medications  Medication Dose Route Frequency Provider Last Rate Last Admin   0.9 %  sodium chloride infusion  500 mL Intravenous Once Meryl Dare, MD       Facility-Administered Medications Ordered in Other Visits  Medication Dose Route Frequency Provider Last Rate Last Admin   gadobenate dimeglumine (MULTIHANCE) injection 20  mL  20 mL Intravenous Once PRN Naomie Dean B, MD       sodium chloride flush (NS) 0.9 % injection 10 mL  10 mL Intravenous PRN Naomie Dean B, MD   20 mL at 10/26/20 1345    Allergies as of 03/24/2023 - Review Complete 03/24/2023  Allergen Reaction Noted   Erythromycin Diarrhea 09/17/2016    Family History  Problem Relation Age of Onset   Diabetes Mother    Hypertension Mother    Hyperlipidemia Mother    Obesity Mother    Heart disease Mother        pacer   Colon polyps Father    Diabetes Father    Heart disease Father        MI   Hyperlipidemia Father    Hypertension Father    Obesity Father    Diabetes Sister    Hyperlipidemia Sister    Hypertension Sister    Obesity Sister    Esophageal cancer Paternal Uncle    Obesity Maternal Grandmother    Hypertension Maternal Grandmother    Hyperlipidemia Maternal Grandmother    Diabetes Maternal Grandmother    Rosacea Maternal Grandmother    Non-Hodgkin's lymphoma Maternal Grandmother    Arthritis Maternal Grandmother        spinal stenosis   Multiple myeloma Maternal Grandfather    Hyperlipidemia Paternal Grandmother        rhabdo from statins   Kidney disease Paternal Grandmother    Migraines Neg Hx    Colon cancer Neg Hx    Crohn's disease Neg Hx    Rectal cancer Neg Hx    Stomach cancer Neg Hx    Ulcerative colitis Neg Hx     Social History   Socioeconomic History   Marital status: Married    Spouse name: Not on file   Number of children: 1   Years of education: MD   Highest education level: Not on  file  Occupational History   Occupation: [place]  Tobacco Use   Smoking status: Never   Smokeless tobacco: Never  Vaping Use   Vaping Use: Never used  Substance and Sexual Activity   Alcohol use: Yes    Alcohol/week: 5.0 standard drinks of alcohol    Types: 5 Glasses of wine per week    Comment: 3-5 glasses of wine per week   Drug use: No   Sexual activity: Yes  Other Topics Concern   Not on file  Social History Narrative   Works With [employer], lives with husband, 3 cats   No major dietary restrictions. Exercise 30 minutes 3 x a week   Seat belts routinely   Right-handed   Caffeine: 1-2 cups of coffee per day   Social Determinants of Health   Financial Resource Strain: Not on file  Food Insecurity: Not on file  Transportation Needs: Not on file  Physical Activity: Not on file  Stress: Not on file  Social Connections: Not on file  Intimate Partner Violence: Not on file    Review of Systems:  All systems reviewed were negative except where noted in HPI.   Physical Exam: General:  Alert, well-developed, in NAD Head:  Normocephalic and atraumatic. Eyes:  Sclera clear, no icterus.   Conjunctiva pink. Ears:  Normal auditory acuity. Mouth:  No deformity or lesions.  Neck:  Supple; no masses. Lungs:  Clear throughout to auscultation.   No wheezes, crackles, or rhonchi.  Heart:  Regular rate and rhythm; no murmurs. Abdomen:  Soft, nondistended, nontender. No masses, hepatomegaly. No palpable masses.  Normal bowel sounds.    Rectal:  Deferred   Msk:  Symmetrical without gross deformities. Extremities:  Without edema. Neurologic:  Alert and  oriented x 4; grossly normal neurologically. Skin:  Intact without significant lesions or rashes. Psych:  Alert and cooperative. Normal mood and affect.  Venita Lick. Russella Dar  03/24/2023, 8:25 AM See Loretha Stapler, [facility], to contact our on call provider

## 2023-03-25 ENCOUNTER — Telehealth: Payer: Self-pay | Admitting: *Deleted

## 2023-03-25 NOTE — Telephone Encounter (Signed)
  Follow up Call-     03/24/2023    8:02 AM 09/13/2022    8:33 AM 02/21/2022   12:00 PM  Call back number  Post procedure Call Back phone  # [phone]  Permission to leave phone message Yes     [facility]

## 2023-03-27 ENCOUNTER — Encounter: Payer: Self-pay | Admitting: Gastroenterology

## 2023-04-07 ENCOUNTER — Encounter: Payer: Self-pay | Admitting: Plastic Surgery

## 2023-04-11 ENCOUNTER — Other Ambulatory Visit: Payer: Self-pay

## 2023-04-11 ENCOUNTER — Encounter: Payer: Self-pay | Admitting: Plastic Surgery

## 2023-04-11 ENCOUNTER — Other Ambulatory Visit (HOSPITAL_COMMUNITY): Payer: Self-pay

## 2023-04-11 ENCOUNTER — Ambulatory Visit (INDEPENDENT_AMBULATORY_CARE_PROVIDER_SITE_OTHER): Payer: Self-pay | Admitting: Plastic Surgery

## 2023-04-11 DIAGNOSIS — Z719 Counseling, unspecified: Secondary | ICD-10-CM

## 2023-04-11 MED ORDER — TRAMADOL HCL 50 MG PO TABS
50.0000 mg | ORAL_TABLET | Freq: Two times a day (BID) | ORAL | 0 refills | Status: AC | PRN
  Filled 2023-04-11: qty 6, 3d supply, fill #0

## 2023-04-11 NOTE — Progress Notes (Signed)
HALO Treatment   Treatment  Settings:       1470 nm : 350-375 microns @ 35 %     2940 nm : 30-40 microns @ max %       Topical and/or Block: Tetracaine and lidocaine  Post Care: given  Notes: The settings for the full face, neck and chest got erased but the forehead is in media.

## 2023-04-11 NOTE — Addendum Note (Signed)
Addended by: Peggye Form on: 04/11/2023 12:45 PM   Modules accepted: Orders

## 2023-04-14 ENCOUNTER — Other Ambulatory Visit: Payer: Self-pay

## 2023-04-16 ENCOUNTER — Other Ambulatory Visit (HOSPITAL_COMMUNITY): Payer: Self-pay

## 2023-04-16 ENCOUNTER — Other Ambulatory Visit: Payer: Self-pay

## 2023-04-16 ENCOUNTER — Other Ambulatory Visit: Payer: Self-pay | Admitting: Family Medicine

## 2023-04-16 MED ORDER — ATORVASTATIN CALCIUM 20 MG PO TABS
20.0000 mg | ORAL_TABLET | Freq: Every day | ORAL | 1 refills | Status: DC
  Filled 2023-04-16: qty 90, 90d supply, fill #0
  Filled 2023-07-25: qty 90, 90d supply, fill #1

## 2023-04-18 ENCOUNTER — Ambulatory Visit: Payer: 59 | Admitting: Physical Therapy

## 2023-04-23 ENCOUNTER — Telehealth: Payer: Self-pay | Admitting: Neurology

## 2023-04-23 NOTE — Telephone Encounter (Signed)
Rescheduled 5/24 Botox appointment due to Dr. Lucia Gaskins being out of office. LVM and sent MyChart message advising pt of change.

## 2023-04-29 ENCOUNTER — Other Ambulatory Visit (HOSPITAL_COMMUNITY): Payer: Self-pay

## 2023-05-02 ENCOUNTER — Other Ambulatory Visit (HOSPITAL_COMMUNITY): Payer: Self-pay

## 2023-05-02 ENCOUNTER — Encounter: Payer: 59 | Admitting: Physical Therapy

## 2023-05-02 ENCOUNTER — Ambulatory Visit: Payer: 59 | Admitting: Neurology

## 2023-05-03 ENCOUNTER — Other Ambulatory Visit (HOSPITAL_COMMUNITY): Payer: Self-pay

## 2023-05-03 ENCOUNTER — Telehealth: Payer: Self-pay

## 2023-05-03 NOTE — Telephone Encounter (Signed)
Pharmacy Patient Advocate Encounter   Received notification from [facility] that prior authorization for Ubrelvy 100MG  tablets is required/requested.   PA submitted on 05/03/2023 to (ins) MEDIMPACT via CoverMyMeds Key or (Medicaid) confirmation # [id number] Status is pending

## 2023-05-06 ENCOUNTER — Other Ambulatory Visit: Payer: Self-pay

## 2023-05-08 ENCOUNTER — Other Ambulatory Visit (HOSPITAL_COMMUNITY): Payer: Self-pay

## 2023-05-08 NOTE — Telephone Encounter (Signed)
Pharmacy Patient Advocate Encounter  Prior Authorization for Ubrelvy 100MG  tablets has been APPROVED by [facility] from 05/16/2023 to 05/14/2024.   PA # PA Case ID #: [id number]  Copay is $0 per test claim

## 2023-05-09 ENCOUNTER — Encounter: Payer: 59 | Admitting: Physical Therapy

## 2023-05-16 ENCOUNTER — Other Ambulatory Visit: Payer: Self-pay

## 2023-05-16 ENCOUNTER — Ambulatory Visit (INDEPENDENT_AMBULATORY_CARE_PROVIDER_SITE_OTHER): Payer: 59 | Admitting: Neurology

## 2023-05-16 ENCOUNTER — Other Ambulatory Visit (HOSPITAL_COMMUNITY): Payer: Self-pay

## 2023-05-16 ENCOUNTER — Other Ambulatory Visit: Payer: Self-pay | Admitting: Family Medicine

## 2023-05-16 DIAGNOSIS — G43009 Migraine without aura, not intractable, without status migrainosus: Secondary | ICD-10-CM

## 2023-05-16 DIAGNOSIS — G43709 Chronic migraine without aura, not intractable, without status migrainosus: Secondary | ICD-10-CM | POA: Diagnosis not present

## 2023-05-16 MED ORDER — ONABOTULINUMTOXINA 200 UNITS IJ SOLR
155.0000 [IU] | Freq: Once | INTRAMUSCULAR | Status: AC
  Administered 2023-05-16: 155 [IU] via INTRAMUSCULAR

## 2023-05-16 MED ORDER — BUPROPION HCL ER (SR) 100 MG PO TB12
100.0000 mg | ORAL_TABLET | Freq: Two times a day (BID) | ORAL | 1 refills | Status: DC
  Filled 2023-05-16: qty 180, 90d supply, fill #0
  Filled 2023-10-15: qty 180, 90d supply, fill #1

## 2023-05-16 MED ORDER — ENALAPRIL MALEATE 2.5 MG PO TABS
2.5000 mg | ORAL_TABLET | Freq: Every day | ORAL | 1 refills | Status: DC
  Filled 2023-05-16: qty 90, 90d supply, fill #0
  Filled 2023-08-22: qty 90, 90d supply, fill #1

## 2023-05-16 NOTE — Progress Notes (Unsigned)
Botox- 200 units x 1 vial Lot: [id number] Expiration: 06/2025 NDC: 4098-1191-47  Bacteriostatic 0.9% Sodium Chloride- 4mL total [id number] Expiration: 03/2024 NDC: 8657-8469-62  Dx: X52.841 SP  Witnessed by Toma Copier, RN

## 2023-05-18 NOTE — Progress Notes (Signed)
Consent Form Botulism Toxin Injection For Chronic Migraine  05/16/2023: stable, doing great 11/15/2022: stable  08/21/2022: At baseline had >25 migraine days a month. Now only has 5 migraine days a month and Less than 10 total headache days a month, great improvement. Has tried and failed nurtec, imitrex, maxalt, zomig, frovatriptan and uses ubrelvy acutely with great sucess  Meds ordered this encounter  Medications   botulinum toxin Type A (BOTOX) injection 155 Units    03/26/2021: Fantastic response> 70% improvement migraine freq and severity.    Reviewed orally with patient, additionally signature is on file:  Botulism toxin has been approved by the Federal drug administration for treatment of chronic migraine. Botulism toxin does not cure chronic migraine and it may not be effective in some patients.  The administration of botulism toxin is accomplished by injecting a small amount of toxin into the muscles of the neck and head. Dosage must be titrated for each individual. Any benefits resulting from botulism toxin tend to wear off after 3 months with a repeat injection required if benefit is to be maintained. Injections are usually done every 3-4 months with maximum effect peak achieved by about 2 or 3 weeks. Botulism toxin is expensive and you should be sure of what costs you will incur resulting from the injection.  The side effects of botulism toxin use for chronic migraine may include:   -Transient, and usually mild, facial weakness with facial injections  -Transient, and usually mild, head or neck weakness with head/neck injections  -Reduction or loss of forehead facial animation due to forehead muscle weakness  -Eyelid drooping  -Dry eye  -Pain at the site of injection or bruising at the site of injection  -Double vision  -Potential unknown long term risks  Contraindications: You should not have Botox if you are pregnant, nursing, allergic to albumin, have an infection, skin  condition, or muscle weakness at the site of the injection, or have myasthenia gravis, Lambert-Eaton syndrome, or ALS.  It is also possible that as with any injection, there may be an allergic reaction or no effect from the medication. Reduced effectiveness after repeated injections is sometimes seen and rarely infection at the injection site may occur. All care will be taken to prevent these side effects. If therapy is given over a long time, atrophy and wasting in the muscle injected may occur. Occasionally the patient's become refractory to treatment because they develop antibodies to the toxin. In this event, therapy needs to be modified.  I have read the above information and consent to the administration of botulism toxin.    BOTOX PROCEDURE NOTE FOR MIGRAINE HEADACHE    Contraindications and precautions discussed with patient(above). Aseptic procedure was observed and patient tolerated procedure. Procedure performed by Dr. Artemio Aly  The condition has existed for more than 6 months, and pt does not have a diagnosis of ALS, Myasthenia Gravis or Lambert-Eaton Syndrome.  Risks and benefits of injections discussed and pt agrees to proceed with the procedure.  Written consent obtained  These injections are medically necessary. Pt  receives good benefits from these injections. These injections do not cause sedations or hallucinations which the oral therapies may cause.  Description of procedure:  The patient was placed in a sitting position. The standard protocol was used for Botox as follows, with 5 units of Botox injected at each site:   -Procerus muscle, midline injection  -Corrugator muscle, bilateral injection  -Frontalis muscle, bilateral injection, with 2 sites each side, medial injection  was performed in the upper one third of the frontalis muscle, in the region vertical from the medial inferior edge of the superior orbital rim. The lateral injection was again in the upper one  third of the forehead vertically above the lateral limbus of the cornea, 1.5 cm lateral to the medial injection site.  -Temporalis muscle injection, 4 sites, bilaterally. The first injection was 3 cm above the tragus of the ear, second injection site was 1.5 cm to 3 cm up from the first injection site in line with the tragus of the ear. The third injection site was 1.5-3 cm forward between the first 2 injection sites. The fourth injection site was 1.5 cm posterior to the second injection site.   -Occipitalis muscle injection, 3 sites, bilaterally. The first injection was done one half way between the occipital protuberance and the tip of the mastoid process behind the ear. The second injection site was done lateral and superior to the first, 1 fingerbreadth from the first injection. The third injection site was 1 fingerbreadth superiorly and medially from the first injection site.  -Cervical paraspinal muscle injection, 2 sites, bilateral knee first injection site was 1 cm from the midline of the cervical spine, 3 cm inferior to the lower border of the occipital protuberance. The second injection site was 1.5 cm superiorly and laterally to the first injection site.  -Trapezius muscle injection was performed at 3 sites, bilaterally. The first injection site was in the upper trapezius muscle halfway between the inflection point of the neck, and the acromion. The second injection site was one half way between the acromion and the first injection site. The third injection was done between the first injection site and the inflection point of the neck.   Will return for repeat injection in 3 months.   155 units of Botox was used, 45U Botox not injected was wasted. The patient tolerated the procedure well, there were no complications of the above procedure.

## 2023-05-19 ENCOUNTER — Other Ambulatory Visit: Payer: Self-pay

## 2023-05-21 ENCOUNTER — Other Ambulatory Visit (HOSPITAL_COMMUNITY): Payer: Self-pay

## 2023-05-22 ENCOUNTER — Other Ambulatory Visit (HOSPITAL_COMMUNITY): Payer: Self-pay

## 2023-05-23 ENCOUNTER — Encounter: Payer: 59 | Admitting: Physical Therapy

## 2023-05-26 ENCOUNTER — Other Ambulatory Visit: Payer: Self-pay

## 2023-05-26 ENCOUNTER — Other Ambulatory Visit: Payer: Self-pay | Admitting: Family Medicine

## 2023-05-26 DIAGNOSIS — N952 Postmenopausal atrophic vaginitis: Secondary | ICD-10-CM

## 2023-05-26 MED ORDER — PREMARIN 0.625 MG/GM VA CREA
1.0000 | TOPICAL_CREAM | VAGINAL | 1 refills | Status: DC
  Filled 2023-05-26 – 2023-06-02 (×2): qty 30, 52d supply, fill #0
  Filled 2023-07-25: qty 30, 52d supply, fill #1

## 2023-05-27 ENCOUNTER — Other Ambulatory Visit (HOSPITAL_COMMUNITY): Payer: Self-pay

## 2023-05-30 ENCOUNTER — Encounter: Payer: 59 | Admitting: Physical Therapy

## 2023-06-02 ENCOUNTER — Other Ambulatory Visit: Payer: Self-pay

## 2023-06-04 ENCOUNTER — Other Ambulatory Visit (HOSPITAL_BASED_OUTPATIENT_CLINIC_OR_DEPARTMENT_OTHER): Payer: Self-pay

## 2023-06-06 ENCOUNTER — Encounter: Payer: 59 | Admitting: Physical Therapy

## 2023-06-09 ENCOUNTER — Other Ambulatory Visit (HOSPITAL_BASED_OUTPATIENT_CLINIC_OR_DEPARTMENT_OTHER): Payer: Self-pay

## 2023-06-09 ENCOUNTER — Other Ambulatory Visit (HOSPITAL_COMMUNITY): Payer: Self-pay

## 2023-06-10 ENCOUNTER — Encounter: Payer: 59 | Admitting: Family Medicine

## 2023-06-13 ENCOUNTER — Other Ambulatory Visit: Payer: Self-pay

## 2023-06-18 ENCOUNTER — Other Ambulatory Visit: Payer: Self-pay | Admitting: Oncology

## 2023-06-18 DIAGNOSIS — Z006 Encounter for examination for normal comparison and control in clinical research program: Secondary | ICD-10-CM

## 2023-06-26 ENCOUNTER — Other Ambulatory Visit (HOSPITAL_COMMUNITY): Payer: Self-pay

## 2023-06-26 ENCOUNTER — Other Ambulatory Visit: Payer: Self-pay

## 2023-06-27 ENCOUNTER — Other Ambulatory Visit: Payer: Self-pay

## 2023-06-30 ENCOUNTER — Other Ambulatory Visit: Payer: Self-pay

## 2023-07-02 ENCOUNTER — Other Ambulatory Visit (HOSPITAL_COMMUNITY): Payer: Self-pay

## 2023-07-07 ENCOUNTER — Other Ambulatory Visit: Payer: Self-pay | Admitting: Family Medicine

## 2023-07-07 ENCOUNTER — Encounter: Payer: Self-pay | Admitting: Family Medicine

## 2023-07-07 DIAGNOSIS — N950 Postmenopausal bleeding: Secondary | ICD-10-CM

## 2023-07-08 ENCOUNTER — Other Ambulatory Visit: Payer: Self-pay | Admitting: Family Medicine

## 2023-07-08 ENCOUNTER — Other Ambulatory Visit (HOSPITAL_COMMUNITY): Payer: Self-pay

## 2023-07-09 ENCOUNTER — Other Ambulatory Visit: Payer: Self-pay

## 2023-07-09 ENCOUNTER — Other Ambulatory Visit (HOSPITAL_COMMUNITY): Payer: Self-pay

## 2023-07-09 MED ORDER — ESCITALOPRAM OXALATE 20 MG PO TABS
20.0000 mg | ORAL_TABLET | Freq: Every morning | ORAL | 0 refills | Status: DC
  Filled 2023-07-09: qty 90, 90d supply, fill #0

## 2023-07-10 ENCOUNTER — Encounter (HOSPITAL_COMMUNITY): Payer: Self-pay

## 2023-07-10 ENCOUNTER — Other Ambulatory Visit (HOSPITAL_COMMUNITY): Payer: Self-pay

## 2023-07-10 ENCOUNTER — Encounter: Payer: Self-pay | Admitting: Plastic Surgery

## 2023-07-19 ENCOUNTER — Other Ambulatory Visit (HOSPITAL_COMMUNITY): Payer: Self-pay

## 2023-07-25 ENCOUNTER — Other Ambulatory Visit: Payer: Self-pay

## 2023-07-25 ENCOUNTER — Other Ambulatory Visit: Payer: Self-pay | Admitting: Family Medicine

## 2023-07-25 MED ORDER — TIRZEPATIDE 15 MG/0.5ML ~~LOC~~ SOAJ
15.0000 mg | SUBCUTANEOUS | 1 refills | Status: DC
  Filled 2023-07-25: qty 2, 28d supply, fill #0
  Filled 2023-08-22: qty 2, 28d supply, fill #1
  Filled 2023-09-22: qty 2, 28d supply, fill #2
  Filled 2023-10-15: qty 2, 28d supply, fill #3
  Filled 2023-11-14: qty 2, 28d supply, fill #4
  Filled 2023-12-12 – 2023-12-16 (×2): qty 2, 28d supply, fill #5

## 2023-07-25 MED ORDER — FREESTYLE LANCETS MISC
12 refills | Status: AC
  Filled 2023-07-25: qty 100, 25d supply, fill #0

## 2023-07-28 ENCOUNTER — Other Ambulatory Visit: Payer: Self-pay

## 2023-07-29 ENCOUNTER — Other Ambulatory Visit (HOSPITAL_COMMUNITY): Payer: Self-pay

## 2023-07-29 ENCOUNTER — Other Ambulatory Visit: Payer: Self-pay

## 2023-07-30 ENCOUNTER — Other Ambulatory Visit: Payer: Self-pay

## 2023-07-30 ENCOUNTER — Other Ambulatory Visit: Payer: Self-pay | Admitting: Obstetrics and Gynecology

## 2023-07-30 DIAGNOSIS — N3281 Overactive bladder: Secondary | ICD-10-CM

## 2023-07-30 MED ORDER — SOLIFENACIN SUCCINATE 5 MG PO TABS
5.0000 mg | ORAL_TABLET | Freq: Every day | ORAL | 5 refills | Status: DC
  Filled 2023-07-30: qty 30, 30d supply, fill #0
  Filled 2023-08-24: qty 30, 30d supply, fill #1
  Filled 2023-10-06: qty 30, 30d supply, fill #2
  Filled 2023-11-14: qty 30, 30d supply, fill #3

## 2023-07-31 ENCOUNTER — Telehealth: Payer: Self-pay

## 2023-07-31 NOTE — Telephone Encounter (Signed)
Called patient to schedule new patient appointment. Left voicemail with our contact information to call back and schedule.  

## 2023-08-01 ENCOUNTER — Encounter: Payer: Self-pay | Admitting: Plastic Surgery

## 2023-08-01 ENCOUNTER — Other Ambulatory Visit (HOSPITAL_COMMUNITY): Payer: Self-pay

## 2023-08-01 ENCOUNTER — Ambulatory Visit (INDEPENDENT_AMBULATORY_CARE_PROVIDER_SITE_OTHER): Payer: Self-pay | Admitting: Plastic Surgery

## 2023-08-01 VITALS — BP 104/75 | HR 78 | Ht 66.0 in | Wt 197.8 lb

## 2023-08-01 DIAGNOSIS — Z719 Counseling, unspecified: Secondary | ICD-10-CM

## 2023-08-01 NOTE — Progress Notes (Signed)
   Subjective:    Patient ID: Amanda James, female    DOB: 18-Aug-1971, 52 y.o.   MRN: [id number]  The patient is a 52 year old female here for evaluation of her face.  Her skin is looking fabulous.  She now is noticing fullness in her upper lids.  She did have her lid blepharoplasties in the past.  Her skin is resting on her upper eyelashes and I think that her forehead is also involved.  I think that the ptosis of the forehead is accentuating the excess skin in the upper lids.  The patient had Cabbell of her chin and that went well.  She had some filler and was pleased with that in the midface area.      Review of Systems  Constitutional: Negative.   Respiratory: Negative.         Objective:   Physical Exam Constitutional:      Appearance: Normal appearance.  HENT:     Head: Normocephalic and atraumatic.  Musculoskeletal:        General: No swelling or deformity.  Neurological:     Mental Status: She is alert and oriented to person, place, and time.  Psychiatric:        Mood and Affect: Mood normal.        Behavior: Behavior normal.        Thought Content: Thought content normal.        Judgment: Judgment normal.        Assessment & Plan:     ICD-10-CM   1. Encounter for counseling  Z71.9        Patient is a good candidate for a forehead lift with upper lid blepharoplasty and we will send her a quote for that.  She will come to the clinic for some filler in some Kybella.  Pictures were obtained of the patient and placed in the chart with the patient's or guardian's permission.

## 2023-08-04 ENCOUNTER — Other Ambulatory Visit: Payer: Self-pay

## 2023-08-05 ENCOUNTER — Other Ambulatory Visit (HOSPITAL_COMMUNITY): Payer: Self-pay

## 2023-08-14 ENCOUNTER — Encounter: Payer: Self-pay | Admitting: Plastic Surgery

## 2023-08-15 ENCOUNTER — Ambulatory Visit (INDEPENDENT_AMBULATORY_CARE_PROVIDER_SITE_OTHER): Payer: Self-pay | Admitting: Plastic Surgery

## 2023-08-15 DIAGNOSIS — Z719 Counseling, unspecified: Secondary | ICD-10-CM

## 2023-08-15 NOTE — Progress Notes (Signed)
Kybella Procedure Note  Procedure: Cosmetic deoxycholic acid injection (1 vial was used)  Pre-operative Diagnosis: Submental fullness  Post-operative Diagnosis: Same  Complications:  None  Brief history: The patient desires improvement in the appearance of moderate to severe convexity or fullness associated with submental fat. I discussed with the patient this proposed procedure of Kybella, which is customized depending on the particular needs of the patient. It is performed on the submental area for reduction in the fat.  The alternatives were discussed with the patient. The risks were addressed including bleeding, scarring, infection, damage to deeper structures, asymmetry, numbness, formation of areas of hardness, swelling, nodules, skin ulceration, headache, alopecia, difficulty swallowing, and muscle weakness. Additionally, marginal mandibular nerve injury could occur and is manifested as an asymmetric smile or facial muscle weakness.  The individual's choice to undergo a surgical procedure is based on the comparison of risks to potential benefits. Injections do not arrest the aging process or produce permanent tightening of the skin.  Operative intervention maybe necessary to maintain the results. The patient understands and wishes to proceed. An informed consent was signed and informational brochures given to her prior to the procedure.  Procedure: The area was prepped with alcohol and dried with a clean gauze. Using a clean technique, the submental area was palpated and pinched.  The skin at the area was pulled and there was appropriate elasticity noted without excessive skin laxity. The patient was asked to tense the platysma to define the subcutaneous fat between the dermis and platysma.  A skin marker was used to mark the anterior, posterior and lateral borders of the submental fat compartment. The "no treatment Zone" was marked. The treatment zone was injected in the subcutaneous layer with  lidocaine 1% with epinepherine. The grid was placed on the skin with the water over it for activation of the ink.  The clear protective sheet was removed leaving the markings.  The dots outside the previously marked treatment zone were removed with an alcohol wipe.  The pre-platysmal fat was pinched with 2 fingers.  Each dot was injected perpendicular to the skin with .2 cc of Kybella using a 1 ml syring and a 30 gauge needle. The ink was wiped off the skin with an alcohol wipe and a cold pack was applied to the treatment area. No complications were noted. Light pressure with an ice pack was held for 5 minutes. She was instructed explicitly in post-operative care including no massage, heavy activity, work out or facial for 24 hours.  Deoxycholic Acid LOT:  [id number]  Filler Injection Procedure Note  Procedure:  Filler administration  Pre-operative Diagnosis: Rytides   Post-operative Diagnosis: Same  Surgeon: Electronically signed by: Wayland Denis, DO   Complications:  None  Brief history: The patient desires injection with fillers in her face. I discussed with the patient this proposed procedure of filler injections, which is customized depending on the particular needs of the patient. It is performed on facial volume loss as a temporary correction. The alternatives were discussed with the patient. The risks were addressed including bleeding, scarring, infection, damage to deeper structures, asymmetry, and chronic pain, which may occur infrequently after a procedure. The individual's choice to undergo a surgical procedure is based on the comparison of risks to potential benefits. Other risks include unsatisfactory results, allergic reaction, which should go away with time, bruising and delayed healing. Fillers do not arrest the aging process or produce permanent tightening.  Operative intervention maybe necessary to maintain the results. The  patient understands and wishes to proceed. An informed  consent was signed and informational brochures given to her prior to the procedure.  Procedure: The area was prepped with chlorhexidine and dried with a clean gauze. Using a clean technique, a 30 gauge needle was then used. The midface area was injected at the medial sub-region of the mid-face.  No complications were noted. Light pressure was held for 5 minutes. She was instructed explicitly in post-operative care.  Restylane Contour 1 ml LOT: [id number]

## 2023-08-22 ENCOUNTER — Other Ambulatory Visit: Payer: Self-pay

## 2023-08-22 NOTE — Progress Notes (Signed)
Office Visit Note  Patient: Amanda James             Date of Birth: 1971-04-07           MRN: [id number]             PCP: Bradd Canary, MD Referring: Bradd Canary, MD Visit Date: 08/29/2023 Occupation: @GUAROCC @  Subjective:  Pain in multiple joints  History of Present Illness: Amanda James is a 52 y.o. female seen in consultation for evaluation of joint pain.  According the patient her symptoms started about 2 years ago with nail thinning.  She states she has a friend who is a dermatologist who looked at her nail pictures and told her that the findings are consistent with psoriasis.  Patient states that she has had scales behind her years which were diagnosed at seborrheic dermatitis in the past.  She also has itching in her ear canals.  She has never noticed rash in any other parts of her body.  She continues to have brittle nails.  She states for the last 2 years she has been having increased lower back pain to the point she had to miss work.  She started seeing Dr. Gracelyn Nurse.  She had extensive workup including x-rays and MRI of her thoracic and lumbar spine which was consistent with degenerative disc disease.  She has had nerve ablation and facet joint injections.  These procedures last only a short time and then the symptoms come back.  She also tried physical therapy for 1 year.  She gives history of discomfort in the SI joints.  She states for the last 1 year she has been having discomfort in her left hip but she points to the trochanteric region.  She states the pain comes back after walking half a mile.  She has also had off-and-on discomfort in her cervical spine for the last 4 months.  She had MRI of her cervical spine in the past which showed degenerative changes.  She states her right wrist joint hurts off and on but she has not noticed any swelling.  She had fracture x 2 of her right wrist joint several years ago.  She continues to have discomfort in the left trochanteric area.   She states she had 2 episodes of plantar fasciitis more than 15 years ago.  Since she had weight loss she has not had much problems with plantar fasciitis.  She denies any history of Achilles tendinitis or uveitis.  She is also noticed Raynauds in her bilateral feet.  She had positive low titer ANA in the past.  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  Her father has psoriasis.  She is gravida 1, para 1.  There is no history of preeclampsia or DVTs.    Activities of Daily Living:  Patient reports morning stiffness for 30-60 minutes.   Patient Denies nocturnal pain.  Difficulty dressing/grooming: Denies Difficulty climbing stairs: Denies Difficulty getting out of chair: Denies Difficulty using hands for taps, buttons, cutlery, and/or writing: Denies  Review of Systems  Constitutional:  Negative for fatigue.  HENT:  Positive for mouth dryness. Negative for mouth sores and nose dryness.   Eyes:  Positive for visual disturbance and dryness. Negative for photophobia, pain and itching.  Respiratory:  Negative for shortness of breath.   Cardiovascular:  Negative for chest pain and palpitations.  Gastrointestinal:  Positive for constipation. Negative for abdominal pain, blood in stool and diarrhea.  Endocrine: Negative for increased urination.  Genitourinary:  Positive for involuntary urination. Negative for painful urination.  Musculoskeletal:  Positive for joint pain, joint pain, myalgias, muscle weakness, morning stiffness, muscle tenderness and myalgias. Negative for gait problem and joint swelling.  Skin:  Positive for color change, rash, hair loss and sensitivity to sunlight.  Allergic/Immunologic: Negative for susceptible to infections.  Neurological:  Negative for dizziness, numbness and headaches.  Hematological:  Negative for swollen glands.  Psychiatric/Behavioral:  Positive for sleep disturbance. Negative for depressed mood. The patient is  not nervous/anxious.     PMFS History:  Patient Active Problem List   Diagnosis Date Noted   Pitting of nails 12/30/2022   Left hip pain 12/30/2022   Encounter for counseling 10/25/2022   Urinary incontinence 08/07/2022   Pneumonia due to infectious organism 08/07/2022   Perimenopausal disorder 08/07/2022   Atrophic vaginitis 08/07/2022   PFO (patent foramen ovale) 02/11/2021   RLS (restless legs syndrome) 02/11/2021   CAD (coronary artery disease) 02/11/2021   Rosacea 10/13/2020   Class 1 obesity due to excess calories with serious comorbidity and body mass index (BMI) of 34.0 to 34.9 in adult 09/11/2020   Loud snoring 09/11/2020   Sleep-related headache 09/11/2020   Recurrent UTI 12/26/2019   Shoulder pain, left 12/26/2019   Low back pain 06/28/2018   Cervical cancer screening 05/02/2017   Chronic migraine w/o aura w/o status migrainosus, not intractable 04/06/2017   Obesity 11/10/2016   Vitamin D deficiency 11/10/2016   Preventative health care 11/10/2016   History of chicken pox    Allergy    Anxiety and depression    Type 2 diabetes mellitus with obesity (HCC)    Hyperlipidemia     Past Medical History:  Diagnosis Date   Allergy    Arthritis    "back"   Cervical cancer screening 05/02/2017   Depression    Depression with anxiety    Diabetes mellitus type 2 in obese    Diabetes mellitus without complication (HCC)    GERD (gastroesophageal reflux disease)    History of chicken pox    Hyperlipidemia    Low back pain    Migraines    Obesity 11/10/2016   PFO (patent foramen ovale)     Family History  Problem Relation Age of Onset   Diabetes Mother    Hypertension Mother    Hyperlipidemia Mother    Obesity Mother    Heart disease Mother        pacer   Colon polyps Father    Diabetes Father    Heart disease Father        MI   Hyperlipidemia Father    Hypertension Father    Obesity Father    Diabetes Sister    Hyperlipidemia Sister    Hypertension  Sister    Obesity Sister    Esophageal cancer Paternal Uncle    Obesity Maternal Grandmother    Hypertension Maternal Grandmother    Hyperlipidemia Maternal Grandmother    Diabetes Maternal Grandmother    Rosacea Maternal Grandmother    Non-Hodgkin's lymphoma Maternal Grandmother    Arthritis Maternal Grandmother        spinal stenosis   Multiple myeloma Maternal Grandfather    Hyperlipidemia Paternal Grandmother        rhabdo from statins   Kidney disease Paternal Grandmother    Healthy Son    Migraines Neg Hx    Colon cancer Neg Hx    Crohn's disease Neg Hx  Rectal cancer Neg Hx    Stomach cancer Neg Hx    Ulcerative colitis Neg Hx    Past Surgical History:  Procedure Laterality Date   ABDOMINOPLASTY  2012   AUGMENTATION MAMMAPLASTY     maxilift with implants    INCONTINENCE SURGERY  2016   LAPAROSCOPIC GASTRIC BANDING  2005   esophageal dilation. band still present but released   MASTOPEXY  2012   Social History   Social History Narrative   Works With [employer], lives with husband, 3 cats   No major dietary restrictions. Exercise 30 minutes 3 x a week   Seat belts routinely   Right-handed   Caffeine: 1-2 cups of coffee per day   Immunization History  Administered Date(s) Administered   Influenza,inj,Quad PF,6+ Mos 09/10/2019, 09/21/2021   Influenza-Unspecified 09/05/2016, 09/20/2017, 10/11/2018, 09/29/2020   Moderna Sars-Covid-2 Vaccination 12/07/2019, 01/04/2020, 11/17/2020   Pfizer Covid-19 Vaccine Bivalent Booster 5y-11y 09/21/2021   Pneumococcal Conjugate-13 12/09/2013   Pneumococcal Polysaccharide-23 12/09/2013   Tdap 09/05/2016     Objective: Vital Signs: BP 128/83 (BP Location: Right Arm, Patient Position: Sitting, Cuff Size: Large)   Pulse 80   Resp 16   Ht 5' 5.5" (1.664 m)   Wt 197 lb (89.4 kg)   LMP 05/18/2021 (Exact Date)   BMI 32.28 kg/m    Physical Exam Vitals and nursing note reviewed.  Constitutional:      Appearance: She is  well-developed.  HENT:     Head: Normocephalic and atraumatic.  Eyes:     Conjunctiva/sclera: Conjunctivae normal.  Cardiovascular:     Rate and Rhythm: Normal rate and regular rhythm.     Heart sounds: Normal heart sounds.  Pulmonary:     Effort: Pulmonary effort is normal.     Breath sounds: Normal breath sounds.  Abdominal:     General: Bowel sounds are normal.     Palpations: Abdomen is soft.  Musculoskeletal:     Cervical back: Normal range of motion.  Lymphadenopathy:     Cervical: No cervical adenopathy.  Skin:    General: Skin is warm and dry.     Capillary Refill: Capillary refill takes less than 2 seconds.     Comments: She had good capillary refill without any nailbed capillary changes or sclerodactyly.  Nails could not be examined as she had nail polish.  She sent some pictures from her cell phone which I will attach.  Neurological:     Mental Status: She is alert and oriented to person, place, and time.  Psychiatric:        Behavior: Behavior normal.      Musculoskeletal Exam: Cervical, thoracic and lumbar spine were in good range of motion.  She had discomfort range of motion of the lumbar spine.  She had tenderness over SI joints.  Shoulder joints, elbow joints, wrist joints, MCPs PIPs and DIPs were in good range of motion with no synovitis.  No dactylitis was noted.  Hip joints were in good range of motion.  She had tenderness over left trochanteric bursa.  Knee joints were in good range of motion without any warmth swelling or effusion.  She had mild tenderness over the right ankle without any warmth or swelling.  There was no Achilles tendinitis or plantar fasciitis.  She had bilateral pes planus.  Callus formation was noted under her feet.  She had no difficulty getting up from the squatting position.  She had no difficulty walking on her tiptoes and her heels.  CDAI Exam: CDAI Score: -- Patient Global: --; Provider Global: -- Swollen: --; Tender: -- Joint Exam  08/29/2023   No joint exam has been documented for this visit   There is currently no information documented on the homunculus. Go to the Rheumatology activity and complete the homunculus joint exam.  Investigation: No additional findings.  Imaging: No results found.  Recent Labs: Lab Results  Component Value Date   WBC 6.7 01/03/2023   HGB 13.8 01/03/2023   PLT 267 01/03/2023   NA 138 01/03/2023   K 4.1 01/03/2023   CL 103 01/03/2023   CO2 27 01/03/2023   GLUCOSE 91 01/03/2023   BUN 20 01/03/2023   CREATININE 0.86 01/03/2023   BILITOT 1.2 01/03/2023   ALKPHOS 65 01/03/2023   AST 26 01/03/2023   ALT 40 (H) 01/03/2023   PROT 7.5 01/03/2023   ALBUMIN 4.4 01/03/2023   CALCIUM 9.7 01/03/2023   GFRAA >60 09/17/2016   January 03, 2023 ANA 1: 40NS, RF 14, HLA-B 5701 negative, ESR 17, TSH normal, vitamin D 77.89 October 04, 2021 hepatitis C nonreactive, HIV nonreactive  The following photographs were sent by the patient:             Speciality Comments: No specialty comments available.  Procedures:  No procedures performed Allergies: Erythromycin   Assessment / Plan:     Visit Diagnoses: Pain in both hands -she complains of pain and stiffness in her bilateral hands.  No synovitis was noted.  No dactylitis was noted.  Plan: XR Hand 2 View Right, XR Hand 2 View Left, x-rays of bilateral hands were unremarkable except for mild early degenerative changes in the [facility] and PIP joints.  Rheumatoid factor, Cyclic citrul peptide antibody, IgG  Pain in both feet -she complains of discomfort in the bilateral feet.  Pes planus was noted.  Calluses were noted under her feet.  She was advised to wear shoes with arch support.  No synovitis or dactylitis was noted.  Plan: XR Foot 2 Views Right, XR Foot 2 Views Left.  Early degenerative changes were noted in the x-rays.  Trochanteric bursitis, left hip-she had discomfort over left trochanteric bursa.  IT band stretches were  demonstrated and a handout was provided.  Chronic midline low back pain, unspecified whether sciatica present-she has had lower back pain for several years.  She had MRI of the lumbar spine on July 30, 2020 which showed mild degenerative changes and pars defect.  She also had facet joint arthropathy.  Chronic SI joint pain -she complains of discomfort in her SI joints.  I will obtain x-rays today.  Plan: XR Pelvis 1-2 Views, x-rays of bilateral SI joints and hip joints were unremarkable.  Sedimentation rate, HLA-B27 antigen  Pitting of nails-she gives history of pitting of nails.  She brought some old pictures which were not very clear.  I advised her to come without the nail polish at the follow-up visit.  She also gives history of dry scales behind her years which has been diagnosed with seborrheic dermatitis in the past.  There was no rash over elbows, knees, umbilical area or natal cleft.  There is no rash in the hairline.  Family history of psoriasis in father  Positive ANA (antinuclear antibody) - 01/03/23: ANA 1:40 nuclear, fine speckled, RF 14, ESR 17, HLA-B*5701 negative -she had low titer positive ANA.  She gives history of fatigue, Raynaud's phenomenon, hair loss.  Will obtain additional labs today.  Plan: ANA, Anti-scleroderma antibody, RNP Antibody,  Anti-Smith antibody, Sjogrens syndrome-A extractable nuclear antibody, Sjogrens syndrome-B extractable nuclear antibody, Anti-DNA antibody, double-stranded, C3 and C4  Raynaud's disease without gangrene-she had bluish discoloration in her feet while she was sitting.  She had good capillary refill in her hands.  No nailbed capillary changes or sclerodactyly was noted.  Other fatigue -she gives history of chronic fatigue.  Plan: CBC with Differential/Platelet, COMPLETE METABOLIC PANEL WITH GFR, TSH  Hair loss-she is.  Hair loss over the last 2 years.  She has recovery from minoxidil.  Vitamin D deficiency-she takes vitamin D.  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  She has been using topical agents.  Patient states she seen a dermatologist in the past and the diagnosis was confirmed.  Other medical problems are listed as follows:  Coronary artery disease due to calcified coronary lesion  PFO (patent foramen ovale)  Type 2 diabetes mellitus with obesity (HCC)  Mixed hyperlipidemia  RLS (restless legs syndrome)  Rosacea  Anxiety and depression  Chronic migraine w/o aura w/o status migrainosus, not intractable  Orders: Orders Placed This Encounter  Procedures   XR Pelvis 1-2 Views   XR Hand 2 View Right   XR Hand 2 View Left   XR Foot 2 Views Right   XR Foot 2 Views Left   CBC with Differential/Platelet   COMPLETE METABOLIC PANEL WITH GFR   Sedimentation rate   TSH   Rheumatoid factor   Cyclic citrul peptide antibody, IgG   HLA-B27 antigen   ANA   Anti-scleroderma antibody   RNP Antibody   Anti-Smith antibody   Sjogrens syndrome-A extractable nuclear antibody   Sjogrens syndrome-B extractable nuclear antibody   Anti-DNA antibody, double-stranded   C3 and C4   No orders of the defined types were placed in this encounter.  .  Follow-Up Instructions: Return for Polyarthralgia, rash.   Pollyann Savoy, MD  Note - This record has been created using Animal nutritionist.  Chart creation errors have been sought, but may not always  have been located. Such creation errors do not reflect on  the standard of medical care.

## 2023-08-24 ENCOUNTER — Other Ambulatory Visit (HOSPITAL_COMMUNITY): Payer: Self-pay

## 2023-08-25 ENCOUNTER — Other Ambulatory Visit: Payer: Self-pay

## 2023-08-28 ENCOUNTER — Other Ambulatory Visit: Payer: Self-pay

## 2023-08-29 ENCOUNTER — Encounter: Payer: Self-pay | Admitting: Rheumatology

## 2023-08-29 ENCOUNTER — Ambulatory Visit (INDEPENDENT_AMBULATORY_CARE_PROVIDER_SITE_OTHER): Payer: 59

## 2023-08-29 ENCOUNTER — Other Ambulatory Visit: Payer: Self-pay

## 2023-08-29 ENCOUNTER — Ambulatory Visit: Payer: 59 | Attending: Rheumatology | Admitting: Rheumatology

## 2023-08-29 ENCOUNTER — Ambulatory Visit: Payer: 59

## 2023-08-29 VITALS — BP 128/83 | HR 80 | Resp 16 | Ht 65.5 in | Wt 197.0 lb

## 2023-08-29 DIAGNOSIS — M7062 Trochanteric bursitis, left hip: Secondary | ICD-10-CM

## 2023-08-29 DIAGNOSIS — M79641 Pain in right hand: Secondary | ICD-10-CM

## 2023-08-29 DIAGNOSIS — M79672 Pain in left foot: Secondary | ICD-10-CM

## 2023-08-29 DIAGNOSIS — L608 Other nail disorders: Secondary | ICD-10-CM | POA: Diagnosis not present

## 2023-08-29 DIAGNOSIS — F419 Anxiety disorder, unspecified: Secondary | ICD-10-CM

## 2023-08-29 DIAGNOSIS — I7300 Raynaud's syndrome without gangrene: Secondary | ICD-10-CM

## 2023-08-29 DIAGNOSIS — Z840 Family history of diseases of the skin and subcutaneous tissue: Secondary | ICD-10-CM

## 2023-08-29 DIAGNOSIS — M79642 Pain in left hand: Secondary | ICD-10-CM | POA: Diagnosis not present

## 2023-08-29 DIAGNOSIS — M545 Low back pain, unspecified: Secondary | ICD-10-CM | POA: Diagnosis not present

## 2023-08-29 DIAGNOSIS — M533 Sacrococcygeal disorders, not elsewhere classified: Secondary | ICD-10-CM | POA: Diagnosis not present

## 2023-08-29 DIAGNOSIS — G8929 Other chronic pain: Secondary | ICD-10-CM

## 2023-08-29 DIAGNOSIS — R5383 Other fatigue: Secondary | ICD-10-CM | POA: Diagnosis not present

## 2023-08-29 DIAGNOSIS — L920 Granuloma annulare: Secondary | ICD-10-CM

## 2023-08-29 DIAGNOSIS — M79671 Pain in right foot: Secondary | ICD-10-CM

## 2023-08-29 DIAGNOSIS — E559 Vitamin D deficiency, unspecified: Secondary | ICD-10-CM

## 2023-08-29 DIAGNOSIS — I2510 Atherosclerotic heart disease of native coronary artery without angina pectoris: Secondary | ICD-10-CM

## 2023-08-29 DIAGNOSIS — E1169 Type 2 diabetes mellitus with other specified complication: Secondary | ICD-10-CM

## 2023-08-29 DIAGNOSIS — L659 Nonscarring hair loss, unspecified: Secondary | ICD-10-CM | POA: Diagnosis not present

## 2023-08-29 DIAGNOSIS — R768 Other specified abnormal immunological findings in serum: Secondary | ICD-10-CM

## 2023-08-29 DIAGNOSIS — G2581 Restless legs syndrome: Secondary | ICD-10-CM

## 2023-08-29 DIAGNOSIS — G43709 Chronic migraine without aura, not intractable, without status migrainosus: Secondary | ICD-10-CM

## 2023-08-29 DIAGNOSIS — M25552 Pain in left hip: Secondary | ICD-10-CM

## 2023-08-29 DIAGNOSIS — I2584 Coronary atherosclerosis due to calcified coronary lesion: Secondary | ICD-10-CM

## 2023-08-29 DIAGNOSIS — L719 Rosacea, unspecified: Secondary | ICD-10-CM

## 2023-08-29 DIAGNOSIS — E782 Mixed hyperlipidemia: Secondary | ICD-10-CM

## 2023-08-29 DIAGNOSIS — F32A Depression, unspecified: Secondary | ICD-10-CM

## 2023-08-29 DIAGNOSIS — E669 Obesity, unspecified: Secondary | ICD-10-CM

## 2023-08-29 DIAGNOSIS — Q2112 Patent foramen ovale: Secondary | ICD-10-CM

## 2023-08-29 NOTE — Patient Instructions (Signed)
Iliotibial Band Syndrome Rehab Ask your health care provider which exercises are safe for you. Do exercises exactly as told by your provider and adjust them as told. It's normal to feel mild stretching, pulling, tightness, or discomfort as you do these exercises. Stop right away if you feel sudden pain or your pain gets a lot worse. Do not begin these exercises until told by your provider. Stretching and range-of-motion exercises These exercises warm up your muscles and joints. They also improve the movement and flexibility of your hip and pelvis. Quadriceps stretch, prone  Lie face down (prone) on a firm surface like a bed or padded floor. Bend your left / right knee. Reach back to hold your ankle or pant leg. If you can't reach your ankle or pant leg, use a belt looped around your foot and grab the belt instead. Gently pull your heel toward your butt. Your knee should not slide out to the side. You should feel a stretch in the front of your thigh and knee, also called the quadriceps. Hold this position for __________ seconds. Repeat __________ times. Complete this exercise __________ times a day. Iliotibial band stretch The iliotibial band is a strip of tissue that runs along the outside of your hip down to your knee. Lie on your side with your left / right leg on top. Bend both knees and grab your left / right ankle. Stretch out your bottom arm to help you balance. Slowly bring your top knee back so your thigh goes behind your back. Slowly lower your top leg toward the floor until you feel a gentle stretch on the outside of your left / right hip and thigh. If you don't feel a stretch and your knee won't go farther, place the heel of your other foot on top of your knee and pull your knee down toward the floor with your foot. Hold this position for __________ seconds. Repeat __________ times. Complete this exercise __________ times a day. Strengthening exercises These exercises build strength  and endurance in your hip and pelvis. Endurance means your muscles can keep working even when they're tired. Straight leg raises, side-lying This exercise strengthens the muscles that rotate the leg at the hip and move it away from your body. These muscles are called hip abductors. Lie on your side with your left / right leg on top. Lie so your head, shoulder, hip, and knee line up. You can bend your bottom knee to help you balance. Roll your hips slightly forward so they're stacked directly over each other. Your left / right knee should face forward. Tense the muscles in your outer thigh and hip. Lift your top leg 4-6 inches (10-15 cm) off the ground. Hold this position for __________ seconds. Slowly lower your leg back down to the starting position. Let your muscles fully relax before doing this exercise again. Repeat __________ times. Complete this exercise __________ times a day. Leg raises, prone This exercise strengthens the muscles that move the hips backward. These muscles are called hip extensors. Lie face down (prone) on your bed or a firm surface. You can put a pillow under your hips for comfort and to support your lower back. Bend your left / right knee so your foot points straight up toward the ceiling. Keep the other leg straight and behind you. Squeeze your butt muscles. Lift your left / right thigh off the firm surface. Do not let your back arch. Tense your thigh muscle as hard as you can without having  more knee pain. Hold this position for __________ seconds. Slowly lower your leg to the starting position. Allow your leg to relax all the way. Repeat __________ times. Complete this exercise __________ times a day. Hip hike  Stand sideways on a bottom step. Place your feet so that your left / right leg is on the step, and the other foot is hanging off the side. If you need support for balance, hold onto a railing or wall. Keep your knees straight and your abdomen square,  meaning your hips are level. Then, lift your left / right hip up toward the ceiling. Slowly let your leg that's hanging off the step lower towards the floor. Your foot should get closer to the ground. Do not lean or bend your knees during this movement. Repeat __________ times. Complete this exercise __________ times a day. This information is not intended to replace advice given to you by your health care provider. Make sure you discuss any questions you have with your health care provider. Document Revised: 02/07/2023 Document Reviewed: 02/07/2023 Elsevier Patient Education  2024 [employer].

## 2023-09-01 LAB — ANTI-SMITH ANTIBODY: ENA SM Ab Ser-aCnc: <1 AI

## 2023-09-01 LAB — CYCLIC CITRUL PEPTIDE ANTIBODY, IGG: Cyclic Citrullin Peptide Ab: <16 UNITS

## 2023-09-01 LAB — SJOGRENS SYNDROME-B EXTRACTABLE NUCLEAR ANTIBODY: SSB (La) (ENA) Antibody, IgG: <1 AI

## 2023-09-01 LAB — COMPLETE METABOLIC PANEL WITH GFR
AG Ratio: 1.6 (calc) (ref 1.0–2.5)
ALT: 29 U/L (ref 6–29)
AST: 23 U/L (ref 10–35)
Albumin: 4.6 g/dL (ref 3.6–5.1)
Alkaline phosphatase (APISO): 72 U/L (ref 37–153)
BUN: 18 mg/dL (ref 7–25)
CO2: 24 mmol/L (ref 20–32)
Calcium: 9.7 mg/dL (ref 8.6–10.4)
Chloride: 104 mmol/L (ref 98–110)
Creat: 0.84 mg/dL (ref 0.50–1.03)
Globulin: 2.8 g/dL (calc) (ref 1.9–3.7)
Glucose, Bld: 77 mg/dL (ref 65–99)
Potassium: 4.5 mmol/L (ref 3.5–5.3)
Sodium: 137 mmol/L (ref 135–146)
Total Bilirubin: 1.1 mg/dL (ref 0.2–1.2)
Total Protein: 7.4 g/dL (ref 6.1–8.1)
eGFR: 83 mL/min/{1.73_m2} (ref >=60)

## 2023-09-01 LAB — ANTI-SCLERODERMA ANTIBODY: Scleroderma (Scl-70) (ENA) Antibody, IgG: <1 AI

## 2023-09-01 LAB — C3 AND C4
C3 Complement: 143 mg/dL (ref 83–193)
C4 Complement: 21 mg/dL (ref 15–57)

## 2023-09-01 LAB — ANTI-DNA ANTIBODY, DOUBLE-STRANDED: ds DNA Ab: <1 IU/mL

## 2023-09-01 LAB — CBC WITH DIFFERENTIAL/PLATELET

## 2023-09-01 LAB — HLA-B27 ANTIGEN: HLA-B27 Antigen: NEGATIVE

## 2023-09-01 LAB — ANA: Anti Nuclear Antibody (ANA): POSITIVE — AB

## 2023-09-01 LAB — SJOGRENS SYNDROME-A EXTRACTABLE NUCLEAR ANTIBODY: SSA (Ro) (ENA) Antibody, IgG: <1 AI

## 2023-09-01 LAB — ANTI-NUCLEAR AB-TITER (ANA TITER): ANA Titer 1: 1:40 {titer} — ABNORMAL HIGH

## 2023-09-01 LAB — RNP ANTIBODY: Ribonucleic Protein(ENA) Antibody, IgG: <1 AI

## 2023-09-01 LAB — SEDIMENTATION RATE

## 2023-09-01 LAB — RHEUMATOID FACTOR: Rheumatoid fact SerPl-aCnc: 11 IU/mL (ref <14)

## 2023-09-01 LAB — TSH: TSH: 4.17 mIU/L

## 2023-09-01 NOTE — Progress Notes (Signed)
All the labs are unremarkable.  I will discuss results at the follow-up visit.

## 2023-09-04 NOTE — Progress Notes (Deleted)
   Rubin Payor, PhD, LAT, ATC acting as a scribe for Clementeen Graham, MD.  Amanda James is a 52 y.o. female who presents to [facility] today for returning low back pain. Pt was last seen by Dr. Denyse Amass on 08/16/22 and repeat facet medial branch ablation bilaterally at L4-5 were ordered and later performed on 09/13/22.  Today, pt reports ***  Dx imaging: 07/30/20 L-spine MRI 07/21/20 L-spine XR   Pertinent review of systems: ***  Relevant historical information: ***   Exam:  LMP 05/18/2021 (Exact Date)  General: Well Developed, well nourished, and in no acute distress.   MSK: ***    Lab and Radiology Results  MRI Lumbar Spine DOS 07/30/20 IMPRESSION: L3-4 and L4-5: Mild bulging of the disc. Mild facet osteoarthritis. No compressive stenosis. Findings could contribute to low back pain.   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 Minimal disc bulge. No stenosis. Findings could contribute to low back pain.     Electronically Signed   By: Paulina Fusi M.D.   On: 07/30/2020 13:56  Most recent facet ablation IMPRESSION: 1. Technically successful bilateral L4-L5 facet radiofrequency ablations.     Electronically Signed   By: Obie Dredge M.D.   On: 09/13/2022 11:11   Assessment and Plan: 52 y.o. female with ***   PDMP not reviewed this encounter. No orders of the defined types were placed in this encounter.  No orders of the defined types were placed in this encounter.    Discussed warning signs or symptoms. Please see discharge instructions. Patient expresses understanding.   ***

## 2023-09-05 ENCOUNTER — Ambulatory Visit: Payer: 59 | Admitting: Family Medicine

## 2023-09-05 ENCOUNTER — Encounter: Payer: Self-pay | Admitting: Family Medicine

## 2023-09-05 DIAGNOSIS — M47816 Spondylosis without myelopathy or radiculopathy, lumbar region: Secondary | ICD-10-CM

## 2023-09-05 DIAGNOSIS — G8929 Other chronic pain: Secondary | ICD-10-CM

## 2023-09-05 NOTE — Telephone Encounter (Signed)
I contacted Raneisha and we spoke.  She had to reschedule due to weather today.  I will go ahead and order the ablation as her pain is consistent with prior chronic pain and she had good benefit with previous ablation.  However she should reschedule with me at some point in the near future typically on a Friday for her schedule to consider x-ray and for a clinical update.

## 2023-09-08 ENCOUNTER — Ambulatory Visit (INDEPENDENT_AMBULATORY_CARE_PROVIDER_SITE_OTHER): Payer: 59

## 2023-09-08 ENCOUNTER — Ambulatory Visit (INDEPENDENT_AMBULATORY_CARE_PROVIDER_SITE_OTHER): Payer: 59 | Admitting: Family Medicine

## 2023-09-08 VITALS — BP 108/78 | HR 70 | Ht 65.5 in | Wt 199.0 lb

## 2023-09-08 DIAGNOSIS — M47816 Spondylosis without myelopathy or radiculopathy, lumbar region: Secondary | ICD-10-CM

## 2023-09-08 DIAGNOSIS — M545 Low back pain, unspecified: Secondary | ICD-10-CM

## 2023-09-08 DIAGNOSIS — G8929 Other chronic pain: Secondary | ICD-10-CM

## 2023-09-08 NOTE — Patient Instructions (Addendum)
Thank you for coming in today.   Please get an Xray today before you leave   Please call [facility] ([facility]) at [phone] to schedule your spine injection.    If these stop working next step is repeat MRI.  We could also ask a Pain Management Doc for a second opinion if needed. Usually I use Dr Lorrine Kin at Neurosurgery office.

## 2023-09-08 NOTE — Progress Notes (Signed)
   Rubin Payor, PhD, LAT, ATC acting as a scribe for Clementeen Graham, MD.  Amanda James is a 52 y.o. female who presents to [facility] at [facility] today for returning low back pain. Pt was last seen by Dr. Denyse Amass on 08/16/22 and repeat facet medial branch ablation bilaterally at L4-5 were ordered and later performed on 09/13/22.  Today, pt reports LBP has returned over the last couple months. Pt locates pain on both sides of her low back, R>L. Slight numbness/tingling into bilat buttocks.   Dx imaging: 07/30/20 L-spine MRI 07/21/20 L-spine XR    Pertinent review of systems: No fevers or chills  Relevant historical information: Currently being evaluated by rheumatology for possible psoriatic arthritis or psoriasis related pain.  Labs so far are normal.   Exam:  BP 108/78   Pulse 70   Ht 5' 5.5" (1.664 m)   Wt 199 lb (90.3 kg)   LMP 05/18/2021 (Exact Date)   SpO2 98%   BMI 32.61 kg/m  General: Well Developed, well nourished, and in no acute distress.   MSK: Spine decreased lumbar motion.    Lab and Radiology Results  X-ray images lumbar spine obtained today personally and independently interpreted DDD L4-5 and L5-S1.  Facet DJD lower portion lumbar spine.  Spondylolisthesis L5-S1. Await formal radiology review     Assessment and Plan: 52 y.o. female with chronic low back pain.  Over the last several years she has done an extensive amount of conservative management.  Ultimately facet medial branch block and ablations targeting bilateral L4-L5 facet joints have worked very well.  She had her second set of these ablations October 2023 which worked until recently.  Plan on repeat facet ablation now.  Continue home exercises focused on core strength.  Last MRI is from 2021.  Could consider updating MRI if needed in the future.   PDMP not reviewed this encounter. Orders Placed This Encounter  Procedures   DG Lumbar Spine 2-3 Views    Standing Status:    Future    Number of Occurrences:   1    Standing Expiration Date:   10/08/2023    Order Specific Question:   Reason for Exam (SYMPTOM  OR DIAGNOSIS REQUIRED)    Answer:   low back pain    Order Specific Question:   Preferred imaging location?    Answer:   [facility]    Order Specific Question:   Is patient pregnant?    Answer:   No   No orders of the defined types were placed in this encounter.    Discussed warning signs or symptoms. Please see discharge instructions. Patient expresses understanding.   The above documentation has been reviewed and is accurate and complete Clementeen Graham, M.D.

## 2023-09-12 DIAGNOSIS — E119 Type 2 diabetes mellitus without complications: Secondary | ICD-10-CM | POA: Diagnosis not present

## 2023-09-12 DIAGNOSIS — D3131 Benign neoplasm of right choroid: Secondary | ICD-10-CM | POA: Diagnosis not present

## 2023-09-12 LAB — HM DIABETES EYE EXAM

## 2023-09-14 NOTE — Progress Notes (Signed)
Office Visit Note  Patient: Amanda James             Date of Birth: 1971/04/08           MRN: [id number]             PCP: Bradd Canary, MD Referring: Bradd Canary, MD Visit Date: 09/26/2023 Occupation: @GUAROCC @  Subjective:  Pain in joints  History of Present Illness: Amanda James is a 52 y.o. female with history of polyarthralgia.  She returns today after her initial visit on August 29, 2023.  She continues to have some joint pain and stiffness in her both hands, both feet, lower back and SI joints.  She states the left trochanteric bursitis improved after doing stretching exercises.  She has not noticed any joint swelling.  She is concerned about her nails being brittle and thin.  She continues to have Raynaud's symptoms in her feet.  She is no history of digital ulcers.    Activities of Daily Living:  Patient reports morning stiffness for 30-60 minutes.   Patient Reports nocturnal pain.  Difficulty dressing/grooming: Denies Difficulty climbing stairs: Denies Difficulty getting out of chair: Denies Difficulty using hands for taps, buttons, cutlery, and/or writing: Reports  Review of Systems  Constitutional:  Negative for fatigue.  HENT:  Positive for mouth dryness. Negative for mouth sores.   Eyes:  Positive for itching and dryness. Negative for pain and redness.  Respiratory:  Negative for shortness of breath.   Cardiovascular:  Negative for chest pain and palpitations.  Gastrointestinal:  Positive for constipation. Negative for blood in stool and diarrhea.  Endocrine: Negative for increased urination.  Genitourinary:  Negative for involuntary urination.  Musculoskeletal:  Positive for joint pain, joint pain, myalgias, morning stiffness, muscle tenderness and myalgias. Negative for gait problem, joint swelling and muscle weakness.  Skin:  Positive for color change and rash. Negative for hair loss and sensitivity to sunlight.  Allergic/Immunologic: Negative for  susceptible to infections.  Neurological:  Negative for dizziness and headaches.  Hematological:  Negative for swollen glands.  Psychiatric/Behavioral:  Positive for sleep disturbance. Negative for depressed mood. The patient is not nervous/anxious.     PMFS History:  Patient Active Problem List   Diagnosis Date Noted   Pitting of nails 12/30/2022   Left hip pain 12/30/2022   Encounter for counseling 10/25/2022   Urinary incontinence 08/07/2022   Pneumonia due to infectious organism 08/07/2022   Perimenopausal disorder 08/07/2022   Atrophic vaginitis 08/07/2022   PFO (patent foramen ovale) 02/11/2021   RLS (restless legs syndrome) 02/11/2021   CAD (coronary artery disease) 02/11/2021   Rosacea 10/13/2020   Class 1 obesity due to excess calories with serious comorbidity and body mass index (BMI) of 34.0 to 34.9 in adult 09/11/2020   Loud snoring 09/11/2020   Sleep-related headache 09/11/2020   Recurrent UTI 12/26/2019   Shoulder pain, left 12/26/2019   Low back pain 06/28/2018   Cervical cancer screening 05/02/2017   Chronic migraine w/o aura w/o status migrainosus, not intractable 04/06/2017   Obesity 11/10/2016   Vitamin D deficiency 11/10/2016   Preventative health care 11/10/2016   History of chicken pox    Allergy    Anxiety and depression    Type 2 diabetes mellitus with obesity (HCC)    Hyperlipidemia     Past Medical History:  Diagnosis Date   Allergy    Arthritis    "back"   Cervical cancer screening 05/02/2017  Depression    Depression with anxiety    Diabetes mellitus type 2 in obese    Diabetes mellitus without complication (HCC)    GERD (gastroesophageal reflux disease)    History of chicken pox    Hyperlipidemia    Low back pain    Migraines    Obesity 11/10/2016   PFO (patent foramen ovale)     Family History  Problem Relation Age of Onset   Diabetes Mother    Hypertension Mother    Hyperlipidemia Mother    Obesity Mother    Heart disease  Mother        pacer   Colon polyps Father    Diabetes Father    Heart disease Father        MI   Hyperlipidemia Father    Hypertension Father    Obesity Father    Diabetes Sister    Hyperlipidemia Sister    Hypertension Sister    Obesity Sister    Esophageal cancer Paternal Uncle    Obesity Maternal Grandmother    Hypertension Maternal Grandmother    Hyperlipidemia Maternal Grandmother    Diabetes Maternal Grandmother    Rosacea Maternal Grandmother    Non-Hodgkin's lymphoma Maternal Grandmother    Arthritis Maternal Grandmother        spinal stenosis   Multiple myeloma Maternal Grandfather    Hyperlipidemia Paternal Grandmother        rhabdo from statins   Kidney disease Paternal Grandmother    Healthy Son    Migraines Neg Hx    Colon cancer Neg Hx    Crohn's disease Neg Hx    Rectal cancer Neg Hx    Stomach cancer Neg Hx    Ulcerative colitis Neg Hx    Past Surgical History:  Procedure Laterality Date   ABDOMINOPLASTY  2012   AUGMENTATION MAMMAPLASTY     maxilift with implants    INCONTINENCE SURGERY  2016   LAPAROSCOPIC GASTRIC BANDING  2005   esophageal dilation. band still present but released   MASTOPEXY  2012   Social History   Social History Narrative   Works With [employer], lives with husband, 3 cats   No major dietary restrictions. Exercise 30 minutes 3 x a week   Seat belts routinely   Right-handed   Caffeine: 1-2 cups of coffee per day   Immunization History  Administered Date(s) Administered   Influenza,inj,Quad PF,6+ Mos 09/10/2019, 09/21/2021   Influenza-Unspecified 09/05/2016, 09/20/2017, 10/11/2018, 09/29/2020   Moderna Sars-Covid-2 Vaccination 12/07/2019, 01/04/2020, 11/17/2020   Pfizer Covid-19 Vaccine Bivalent Booster 5y-11y 09/21/2021   Pneumococcal Conjugate-13 12/09/2013   Pneumococcal Polysaccharide-23 12/09/2013   Tdap 09/05/2016     Objective: Vital Signs: BP 114/82 (BP Location: Left Arm, Patient Position: Sitting, Cuff Size:  Normal)   Pulse 75   Resp 16   Ht 5\' 6"  (1.676 m)   Wt 196 lb 9.6 oz (89.2 kg)   LMP 05/18/2021 (Exact Date)   BMI 31.73 kg/m    Physical Exam Vitals and nursing note reviewed.  Constitutional:      Appearance: She is well-developed.  HENT:     Head: Normocephalic and atraumatic.  Eyes:     Conjunctiva/sclera: Conjunctivae normal.  Cardiovascular:     Rate and Rhythm: Normal rate and regular rhythm.     Heart sounds: Normal heart sounds.  Pulmonary:     Effort: Pulmonary effort is normal.     Breath sounds: Normal breath sounds.  Abdominal:  General: Bowel sounds are normal.     Palpations: Abdomen is soft.  Musculoskeletal:     Cervical back: Normal range of motion.  Lymphadenopathy:     Cervical: No cervical adenopathy.  Skin:    General: Skin is warm and dry.     Capillary Refill: Capillary refill takes less than 2 seconds.     Comments: Ridging of nails was noted.  No nail pitting was noted.  Neurological:     Mental Status: She is alert and oriented to person, place, and time.  Psychiatric:        Behavior: Behavior normal.      Musculoskeletal Exam: Cervical, thoracic and lumbar spine were in good range of motion.  She had no tenderness over SI joints.  Shoulder joints, elbow joints, wrist joints, MCPs PIPs and DIPs were in good range of motion with no synovitis.  Hip joints were in good range of motion.  She has tenderness over left trochanteric bursa improved.  Hip joints and knee joints in good range of motion.  There was no tenderness over ankles or MTPs.  Callus formation was noted under her left great toe.  No plantar fasciitis or Achilles tendinitis was noted.  CDAI Exam: CDAI Score: -- Patient Global: --; Provider Global: -- Swollen: --; Tender: -- Joint Exam 09/26/2023   No joint exam has been documented for this visit   There is currently no information documented on the homunculus. Go to the Rheumatology activity and complete the homunculus joint  exam.  Investigation: No additional findings.  Imaging: XR Pelvis 1-2 Views  Result Date: 08/29/2023 No SI joint sclerosis or narrowing was noted.  No hip joint narrowing was noted. Impression: Unremarkable x-rays of the hip joints and SI joints.  XR Foot 2 Views Left  Result Date: 08/29/2023 No MTP narrowing was noted.  Mild PIP and DIP narrowing was noted.  No intertarsal, tibiotalar or subtalar joint space narrowing was noted.  Inferior calcaneal spur was noted. Impression: Early degenerative changes were noted on the x-rays of the foot.  XR Foot 2 Views Right  Result Date: 08/29/2023 No MTP narrowing was noted.  Mild PIP and DIP narrowing was noted.  No intertarsal, tibiotalar or subtalar joint space narrowing was noted.  Inferior calcaneal spur was noted. Impression: Early degenerative changes were noted on the x-rays of the foot.  XR Hand 2 View Left  Result Date: 08/29/2023 Mild CMC and PIP narrowing was noted.  No DIP, MCP, intercarpal radiocarpal joint space narrowing was noted.  No erosive changes were noted. Impression: These findings are suggestive of early degenerative changes of the hand.  XR Hand 2 View Right  Result Date: 08/29/2023 Mild CMC and PIP narrowing was noted.  No DIP, MCP, intercarpal radiocarpal joint space narrowing was noted.  No erosive changes were noted. Impression: These findings are suggestive of early degenerative changes of the hand.   Recent Labs: Lab Results  Component Value Date   WBC CANCELED 08/29/2023   HGB 13.8 01/03/2023   PLT 267 01/03/2023   NA 137 08/29/2023   K 4.5 08/29/2023   CL 104 08/29/2023   CO2 24 08/29/2023   GLUCOSE 77 08/29/2023   BUN 18 08/29/2023   CREATININE 0.84 08/29/2023   BILITOT 1.1 08/29/2023   ALKPHOS 65 01/03/2023   AST 23 08/29/2023   ALT 29 08/29/2023   PROT 7.4 08/29/2023   ALBUMIN 4.4 01/03/2023   CALCIUM 9.7 08/29/2023   GFRAA >60 09/17/2016   August 29, 2023 ANA 1: 40 NS, ENA (SCL 70, RNP,  Smith, SSA, SSB, dsDNA) negative, C3-C4 normal, RF negative, anti-CCP negative, TSH normal, HLA-B27 negative  Speciality Comments: No specialty comments available.  Procedures:  No procedures performed Allergies: Erythromycin   Assessment / Plan:     Visit Diagnoses: Pain in both hands - No synovitis was noted.  X-rays of bilateral hands obtained at the last visit showed early degenerative changes.  X-ray findings were reviewed with the patient.  A handout on hand exercises was given.  Advised patient to contact me if she develops any worsening of symptoms.  Pain in both feet - History of discomfort.  Pes planus noted.  Calluses were noted under bilateral feet.  Orthotics were advised.  X-rays obtained at the last visit showed early degenerative changes.  X-ray endings were reviewed with the patient.  Trochanteric bursitis, left hip-patient states that left trochanteric bursitis symptoms improved after doing stretching exercises.  Chronic midline low back pain, unspecified whether sciatica present - July 30, 2020 MRI showed mild degenerative changes and pars defect.  Facet joint arthropathy was noted.  She goes for radiofrequency ablation which is helpful.  Chronic SI joint pain -she has recurrent lower back pain.  X-rays obtained at the last visit were unremarkable.  X-rays were reviewed with the patient.  Sed rate normal, HLA-B27 negative.  Pitting of nails -no nail pitting was noted.  Ridging of nails was noted.  Family history of psoriasis in father  Positive ANA (antinuclear antibody) - ANA low titer positive, ENA negative, C3-C4 normal, sed rate normal, RF negative.  Labs were reviewed with the patient.  Raynaud's disease without gangrene -no nailbed capillary changes were noted.  No sclerodactyly was noted.  Hair loss - Hair loss over the last 2 years.  She has been taking minoxidil.  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  She is followed by dermatologist.  Other  medical problems listed as follows:  Vitamin D deficiency  Other fatigue  Type 2 diabetes mellitus with obesity (HCC)  Coronary artery disease due to calcified coronary lesion  Mixed hyperlipidemia  PFO (patent foramen ovale)  Rosacea  RLS (restless legs syndrome)  Anxiety and depression  Chronic migraine w/o aura w/o status migrainosus, not intractable  Orders: No orders of the defined types were placed in this encounter.  No orders of the defined types were placed in this encounter.    Follow-Up Instructions: Return if symptoms worsen or fail to improve, for Osteoarthritis.   Pollyann Savoy, MD  Note - This record has been created using Animal nutritionist.  Chart creation errors have been sought, but may not always  have been located. Such creation errors do not reflect on  the standard of medical care.

## 2023-09-22 ENCOUNTER — Other Ambulatory Visit (HOSPITAL_COMMUNITY): Payer: Self-pay

## 2023-09-22 ENCOUNTER — Other Ambulatory Visit: Payer: Self-pay

## 2023-09-22 ENCOUNTER — Other Ambulatory Visit: Payer: Self-pay | Admitting: Family Medicine

## 2023-09-22 DIAGNOSIS — M545 Low back pain, unspecified: Secondary | ICD-10-CM

## 2023-09-22 MED ORDER — CYCLOBENZAPRINE HCL 10 MG PO TABS
10.0000 mg | ORAL_TABLET | Freq: Three times a day (TID) | ORAL | 3 refills | Status: DC | PRN
  Filled 2023-09-22: qty 30, 10d supply, fill #0
  Filled 2023-10-06: qty 30, 10d supply, fill #1
  Filled 2024-01-19: qty 30, 10d supply, fill #2
  Filled 2024-04-07: qty 30, 10d supply, fill #3

## 2023-09-23 ENCOUNTER — Other Ambulatory Visit: Payer: Self-pay

## 2023-09-23 ENCOUNTER — Other Ambulatory Visit (HOSPITAL_COMMUNITY): Payer: Self-pay

## 2023-09-25 ENCOUNTER — Other Ambulatory Visit: Payer: Self-pay

## 2023-09-26 ENCOUNTER — Encounter: Payer: Self-pay | Admitting: Rheumatology

## 2023-09-26 ENCOUNTER — Ambulatory Visit: Payer: 59 | Attending: Rheumatology | Admitting: Rheumatology

## 2023-09-26 VITALS — BP 114/82 | HR 75 | Resp 16 | Ht 66.0 in | Wt 196.6 lb

## 2023-09-26 DIAGNOSIS — E559 Vitamin D deficiency, unspecified: Secondary | ICD-10-CM

## 2023-09-26 DIAGNOSIS — M79672 Pain in left foot: Secondary | ICD-10-CM

## 2023-09-26 DIAGNOSIS — L608 Other nail disorders: Secondary | ICD-10-CM | POA: Diagnosis not present

## 2023-09-26 DIAGNOSIS — L659 Nonscarring hair loss, unspecified: Secondary | ICD-10-CM

## 2023-09-26 DIAGNOSIS — R768 Other specified abnormal immunological findings in serum: Secondary | ICD-10-CM

## 2023-09-26 DIAGNOSIS — M533 Sacrococcygeal disorders, not elsewhere classified: Secondary | ICD-10-CM

## 2023-09-26 DIAGNOSIS — Z840 Family history of diseases of the skin and subcutaneous tissue: Secondary | ICD-10-CM

## 2023-09-26 DIAGNOSIS — G2581 Restless legs syndrome: Secondary | ICD-10-CM

## 2023-09-26 DIAGNOSIS — E782 Mixed hyperlipidemia: Secondary | ICD-10-CM

## 2023-09-26 DIAGNOSIS — L920 Granuloma annulare: Secondary | ICD-10-CM

## 2023-09-26 DIAGNOSIS — I7300 Raynaud's syndrome without gangrene: Secondary | ICD-10-CM

## 2023-09-26 DIAGNOSIS — R5383 Other fatigue: Secondary | ICD-10-CM

## 2023-09-26 DIAGNOSIS — M79671 Pain in right foot: Secondary | ICD-10-CM | POA: Diagnosis not present

## 2023-09-26 DIAGNOSIS — G8929 Other chronic pain: Secondary | ICD-10-CM

## 2023-09-26 DIAGNOSIS — M545 Low back pain, unspecified: Secondary | ICD-10-CM | POA: Diagnosis not present

## 2023-09-26 DIAGNOSIS — G43709 Chronic migraine without aura, not intractable, without status migrainosus: Secondary | ICD-10-CM

## 2023-09-26 DIAGNOSIS — M79641 Pain in right hand: Secondary | ICD-10-CM | POA: Diagnosis not present

## 2023-09-26 DIAGNOSIS — R7689 Other specified abnormal immunological findings in serum: Secondary | ICD-10-CM

## 2023-09-26 DIAGNOSIS — M7062 Trochanteric bursitis, left hip: Secondary | ICD-10-CM

## 2023-09-26 DIAGNOSIS — M79642 Pain in left hand: Secondary | ICD-10-CM

## 2023-09-26 DIAGNOSIS — F32A Depression, unspecified: Secondary | ICD-10-CM

## 2023-09-26 DIAGNOSIS — I2510 Atherosclerotic heart disease of native coronary artery without angina pectoris: Secondary | ICD-10-CM

## 2023-09-26 DIAGNOSIS — F419 Anxiety disorder, unspecified: Secondary | ICD-10-CM

## 2023-09-26 DIAGNOSIS — I2584 Coronary atherosclerosis due to calcified coronary lesion: Secondary | ICD-10-CM

## 2023-09-26 DIAGNOSIS — Q2112 Patent foramen ovale: Secondary | ICD-10-CM

## 2023-09-26 DIAGNOSIS — L719 Rosacea, unspecified: Secondary | ICD-10-CM

## 2023-09-26 DIAGNOSIS — E669 Obesity, unspecified: Secondary | ICD-10-CM

## 2023-09-26 DIAGNOSIS — E1169 Type 2 diabetes mellitus with other specified complication: Secondary | ICD-10-CM

## 2023-09-26 NOTE — Patient Instructions (Signed)

## 2023-09-29 NOTE — Progress Notes (Signed)
Lumbar spine x-ray shows mild arthritis.

## 2023-10-01 NOTE — Discharge Instructions (Signed)
Radio Frequency Ablation Post Procedure Discharge Instructions ? ?May resume a regular diet and any medications that you routinely take (including pain medications). ?No driving day of procedure. ?Upon discharge go home and rest for at least 4 hours.  May use an ice pack as needed to injection sites on back. ?Remove bandades later, today. ? ? ? ?Please contact our office at [phone] for the following symptoms: ? ?Fever greater than 100 degrees ?Increased swelling, pain, or redness at injection site. ? ? ?Thank you for visiting [facility].  ?

## 2023-10-02 ENCOUNTER — Other Ambulatory Visit: Payer: Self-pay | Admitting: Family Medicine

## 2023-10-02 ENCOUNTER — Ambulatory Visit
Admission: RE | Admit: 2023-10-02 | Discharge: 2023-10-02 | Disposition: A | Discharge status: Home / Self Care | Payer: 59 | Source: Ambulatory Visit | Attending: Family Medicine | Admitting: Family Medicine

## 2023-10-02 DIAGNOSIS — M545 Low back pain, unspecified: Secondary | ICD-10-CM

## 2023-10-02 DIAGNOSIS — M47816 Spondylosis without myelopathy or radiculopathy, lumbar region: Secondary | ICD-10-CM

## 2023-10-02 MED ORDER — MIDAZOLAM HCL 2 MG/2ML IJ SOLN
1.0000 mg | INTRAMUSCULAR | Status: DC | PRN
  Administered 2023-10-02 (×4): 1 mg via INTRAVENOUS

## 2023-10-02 MED ORDER — METHYLPREDNISOLONE ACETATE 40 MG/ML INJ SUSP (RADIOLOG
120.0000 mg | Freq: Once | INTRAMUSCULAR | Status: AC
  Administered 2023-10-02: 120 mg via INTRA_ARTICULAR

## 2023-10-02 MED ORDER — SODIUM CHLORIDE 0.9 % IV SOLN: INTRAVENOUS | Status: DC

## 2023-10-02 MED ORDER — KETOROLAC TROMETHAMINE 30 MG/ML IJ SOLN
30.0000 mg | Freq: Once | INTRAMUSCULAR | Status: AC
  Administered 2023-10-02: 30 mg via INTRAVENOUS

## 2023-10-02 MED ORDER — FENTANYL CITRATE PF 50 MCG/ML IJ SOSY
25.0000 ug | PREFILLED_SYRINGE | INTRAMUSCULAR | Status: DC | PRN
  Administered 2023-10-02 (×5): 25 ug via INTRAVENOUS

## 2023-10-02 NOTE — Progress Notes (Signed)
Pt back in nursing recovery area. Pt still drowsy from procedure but will wake up when spoken to. Pt follows commands, talks in complete sentences and has no complaints at this time. Pt will be monitored until discharged by Radiologist.   

## 2023-10-03 ENCOUNTER — Encounter: Payer: Self-pay | Admitting: Family Medicine

## 2023-10-06 ENCOUNTER — Other Ambulatory Visit: Payer: Self-pay

## 2023-10-15 ENCOUNTER — Other Ambulatory Visit (HOSPITAL_COMMUNITY): Payer: Self-pay

## 2023-10-15 ENCOUNTER — Other Ambulatory Visit: Payer: Self-pay | Admitting: Family Medicine

## 2023-10-15 ENCOUNTER — Other Ambulatory Visit: Payer: Self-pay

## 2023-10-15 DIAGNOSIS — N952 Postmenopausal atrophic vaginitis: Secondary | ICD-10-CM

## 2023-10-15 MED ORDER — PREMARIN 0.625 MG/GM VA CREA
1.0000 | TOPICAL_CREAM | VAGINAL | 1 refills | Status: DC
  Filled 2023-10-15: qty 30, 53d supply, fill #0
  Filled 2023-12-03: qty 30, 53d supply, fill #1

## 2023-10-15 MED ORDER — ATORVASTATIN CALCIUM 20 MG PO TABS
20.0000 mg | ORAL_TABLET | Freq: Every day | ORAL | 1 refills | Status: DC
  Filled 2023-10-15: qty 90, 90d supply, fill #0
  Filled 2024-01-19: qty 90, 90d supply, fill #1

## 2023-10-15 MED ORDER — ESCITALOPRAM OXALATE 20 MG PO TABS
20.0000 mg | ORAL_TABLET | Freq: Every morning | ORAL | 0 refills | Status: DC
  Filled 2023-10-15: qty 90, 90d supply, fill #0

## 2023-10-16 ENCOUNTER — Other Ambulatory Visit: Payer: Self-pay

## 2023-10-24 ENCOUNTER — Other Ambulatory Visit: Payer: Self-pay

## 2023-10-24 NOTE — Progress Notes (Signed)
Removing patient from specialty pharmacy program. Unable to reach patient, no upcoming appointments for injection, and botox has not been filled since 05/07/23.

## 2023-11-13 ENCOUNTER — Ambulatory Visit: Payer: 59 | Admitting: Family Medicine

## 2023-11-14 ENCOUNTER — Other Ambulatory Visit: Payer: Self-pay | Admitting: Family Medicine

## 2023-11-14 ENCOUNTER — Other Ambulatory Visit: Payer: Self-pay

## 2023-11-14 ENCOUNTER — Other Ambulatory Visit (HOSPITAL_COMMUNITY): Payer: Self-pay

## 2023-11-14 MED ORDER — ENALAPRIL MALEATE 2.5 MG PO TABS
2.5000 mg | ORAL_TABLET | Freq: Every day | ORAL | 1 refills | Status: DC
  Filled 2023-11-14: qty 90, 90d supply, fill #0
  Filled 2024-04-07: qty 90, 90d supply, fill #1

## 2023-12-03 ENCOUNTER — Other Ambulatory Visit: Payer: Self-pay | Admitting: Family Medicine

## 2023-12-03 ENCOUNTER — Other Ambulatory Visit (HOSPITAL_COMMUNITY): Payer: Self-pay

## 2023-12-04 ENCOUNTER — Other Ambulatory Visit (HOSPITAL_COMMUNITY): Payer: Self-pay

## 2023-12-04 ENCOUNTER — Other Ambulatory Visit: Payer: Self-pay

## 2023-12-04 MED ORDER — FREESTYLE LITE TEST VI STRP
ORAL_STRIP | 12 refills | Status: AC
  Filled 2023-12-04: qty 100, 25d supply, fill #0
  Filled 2023-12-26 – 2024-01-16 (×2): qty 100, 25d supply, fill #1

## 2023-12-12 ENCOUNTER — Other Ambulatory Visit: Payer: Self-pay

## 2023-12-16 ENCOUNTER — Other Ambulatory Visit: Payer: Self-pay

## 2023-12-26 ENCOUNTER — Other Ambulatory Visit: Payer: Self-pay

## 2023-12-26 ENCOUNTER — Other Ambulatory Visit: Payer: Self-pay | Admitting: Family Medicine

## 2023-12-26 DIAGNOSIS — Z1231 Encounter for screening mammogram for malignant neoplasm of breast: Secondary | ICD-10-CM

## 2024-01-16 ENCOUNTER — Encounter: Payer: Self-pay | Admitting: Pharmacist

## 2024-01-16 ENCOUNTER — Other Ambulatory Visit: Payer: Self-pay

## 2024-01-16 ENCOUNTER — Other Ambulatory Visit (HOSPITAL_COMMUNITY): Payer: Self-pay

## 2024-01-16 ENCOUNTER — Other Ambulatory Visit: Payer: Self-pay | Admitting: Family Medicine

## 2024-01-16 MED ORDER — BUPROPION HCL ER (SR) 100 MG PO TB12
100.0000 mg | ORAL_TABLET | Freq: Two times a day (BID) | ORAL | 0 refills | Status: DC
  Filled 2024-01-16: qty 180, 90d supply, fill #0

## 2024-01-16 MED ORDER — MOUNJARO 15 MG/0.5ML ~~LOC~~ SOAJ
15.0000 mg | SUBCUTANEOUS | 1 refills | Status: DC
  Filled 2024-01-16: qty 6, 84d supply, fill #0
  Filled 2024-04-07: qty 6, 84d supply, fill #1

## 2024-01-16 MED ORDER — ESCITALOPRAM OXALATE 20 MG PO TABS
20.0000 mg | ORAL_TABLET | Freq: Every morning | ORAL | 0 refills | Status: DC
  Filled 2024-01-16: qty 90, 90d supply, fill #0

## 2024-01-19 ENCOUNTER — Encounter (HOSPITAL_COMMUNITY): Payer: Self-pay

## 2024-01-19 ENCOUNTER — Other Ambulatory Visit: Payer: Self-pay

## 2024-01-19 ENCOUNTER — Other Ambulatory Visit (HOSPITAL_COMMUNITY): Payer: Self-pay

## 2024-01-19 ENCOUNTER — Other Ambulatory Visit: Payer: Self-pay | Admitting: Family Medicine

## 2024-01-19 DIAGNOSIS — N952 Postmenopausal atrophic vaginitis: Secondary | ICD-10-CM

## 2024-01-19 MED ORDER — PREMARIN 0.625 MG/GM VA CREA
1.0000 | TOPICAL_CREAM | VAGINAL | 1 refills | Status: DC
  Filled 2024-01-19: qty 30, 52d supply, fill #0
  Filled 2024-04-07: qty 30, 53d supply, fill #0
  Filled 2024-05-26: qty 30, 53d supply, fill #1

## 2024-01-21 ENCOUNTER — Other Ambulatory Visit: Payer: Self-pay

## 2024-01-22 NOTE — Progress Notes (Unsigned)
   Rubin Payor, PhD, LAT, ATC acting as a scribe for Clementeen Graham, MD.  Sadiyah D Kleven is a 53 y.o. female who presents to [facility] at [facility] today for neck pain. Pt was previously seen by Dr. Denyse Amass on 09/08/23 for lumbar facet arthritis.  Today, pt c/o neck pain x ***. Pt locates pain to ***  Radiates:  UE Numbness/tingling: UE Weakness: Aggravates: Treatments tried:  Pertinent review of systems: ***  Relevant historical information: ***   Exam:  LMP 05/18/2021 (Exact Date)  General: Well Developed, well nourished, and in no acute distress.   MSK: ***    Lab and Radiology Results No results found for this or any previous visit (from the past 72 hours). No results found.     Assessment and Plan: 53 y.o. female with ***   PDMP not reviewed this encounter. No orders of the defined types were placed in this encounter.  No orders of the defined types were placed in this encounter.    Discussed warning signs or symptoms. Please see discharge instructions. Patient expresses understanding.   ***

## 2024-01-23 ENCOUNTER — Ambulatory Visit: Payer: 59 | Admitting: Family Medicine

## 2024-01-23 ENCOUNTER — Encounter: Payer: Self-pay | Admitting: Family Medicine

## 2024-01-23 ENCOUNTER — Ambulatory Visit: Payer: 59

## 2024-01-23 ENCOUNTER — Ambulatory Visit
Admission: RE | Admit: 2024-01-23 | Discharge: 2024-01-23 | Disposition: A | Discharge status: Home / Self Care | Payer: 59 | Source: Ambulatory Visit

## 2024-01-23 ENCOUNTER — Other Ambulatory Visit: Payer: Self-pay

## 2024-01-23 VITALS — BP 102/64 | HR 80 | Ht 66.0 in | Wt 201.0 lb

## 2024-01-23 DIAGNOSIS — M47812 Spondylosis without myelopathy or radiculopathy, cervical region: Secondary | ICD-10-CM | POA: Diagnosis not present

## 2024-01-23 DIAGNOSIS — Z1231 Encounter for screening mammogram for malignant neoplasm of breast: Secondary | ICD-10-CM

## 2024-01-23 DIAGNOSIS — M542 Cervicalgia: Secondary | ICD-10-CM | POA: Diagnosis not present

## 2024-01-23 DIAGNOSIS — M4802 Spinal stenosis, cervical region: Secondary | ICD-10-CM | POA: Diagnosis not present

## 2024-01-23 DIAGNOSIS — G8929 Other chronic pain: Secondary | ICD-10-CM

## 2024-01-23 DIAGNOSIS — M5030 Other cervical disc degeneration, unspecified cervical region: Secondary | ICD-10-CM | POA: Diagnosis not present

## 2024-01-23 NOTE — Patient Instructions (Addendum)
Thank you for coming in today.   Please get an Xray today before you leave   I've referred you to Physical Therapy.  Let us know if you don't hear from them in one week.   Use a heating pad.   Let me know if this is not working.   TENS UNIT: This is helpful for muscle pain and spasm.   Search and Purchase a TENS 7000 2nd edition at  www.tenspros.com or www.Amazon.com It should be less than $30.     TENS unit instructions: Do not shower or bathe with the unit on Turn the unit off before removing electrodes or batteries If the electrodes lose stickiness add a drop of water to the electrodes after they are disconnected from the unit and place on plastic sheet. If you continued to have difficulty, call the TENS unit company to purchase more electrodes. Do not apply lotion on the skin area prior to use. Make sure the skin is clean and dry as this will help prolong the life of the electrodes. After use, always check skin for unusual red areas, rash or other skin difficulties. If there are any skin problems, does not apply electrodes to the same area. Never remove the electrodes from the unit by pulling the wires. Do not use the TENS unit or electrodes other than as directed. Do not change electrode placement without consultating your therapist or physician. Keep 2 fingers with between each electrode. Wear time ratio is 2:1, on to off times.    For example on for 30 minutes off for 15 minutes and then on for 30 minutes off for 15 minutes

## 2024-02-05 ENCOUNTER — Ambulatory Visit: Payer: 59 | Admitting: Obstetrics and Gynecology

## 2024-02-06 ENCOUNTER — Ambulatory Visit: Payer: 59 | Attending: Family Medicine | Admitting: Physical Therapy

## 2024-02-06 ENCOUNTER — Other Ambulatory Visit: Payer: Self-pay

## 2024-02-06 ENCOUNTER — Encounter: Payer: Self-pay | Admitting: Physical Therapy

## 2024-02-06 ENCOUNTER — Ambulatory Visit: Payer: 59 | Admitting: Obstetrics and Gynecology

## 2024-02-06 DIAGNOSIS — M542 Cervicalgia: Secondary | ICD-10-CM | POA: Diagnosis not present

## 2024-02-06 DIAGNOSIS — R293 Abnormal posture: Secondary | ICD-10-CM | POA: Diagnosis not present

## 2024-02-06 DIAGNOSIS — R252 Cramp and spasm: Secondary | ICD-10-CM | POA: Insufficient documentation

## 2024-02-06 DIAGNOSIS — M6281 Muscle weakness (generalized): Secondary | ICD-10-CM | POA: Insufficient documentation

## 2024-02-06 DIAGNOSIS — G8929 Other chronic pain: Secondary | ICD-10-CM | POA: Diagnosis not present

## 2024-02-06 NOTE — Therapy (Signed)
 OUTPATIENT PHYSICAL THERAPY CERVICAL EVALUATION   Patient Name: Amanda James MRN: [id number] DOB:Aug 26, 1971, 53 y.o., female Today's Date: 02/06/2024  END OF SESSION:  PT End of Session - 02/06/24 1210     Visit Number 1    Date for PT Re-Evaluation 04/02/24    Authorization Type [place] [employer] Save    PT Start Time [phone]    PT Stop Time 0932    PT Time Calculation (min) 44 min    Activity Tolerance Patient tolerated treatment well    Behavior During Therapy [facility] for tasks assessed/performed             Past Medical History:  Diagnosis Date   Allergy    Arthritis    "back"   Cervical cancer screening 05/02/2017   Depression    Depression with anxiety    Diabetes mellitus type 2 in obese    Diabetes mellitus without complication (HCC)    GERD (gastroesophageal reflux disease)    History of chicken pox    Hyperlipidemia    Low back pain    Migraines    Obesity 11/10/2016   PFO (patent foramen ovale)    Past Surgical History:  Procedure Laterality Date   ABDOMINOPLASTY  2012   AUGMENTATION MAMMAPLASTY  2014   maxilift with implants    INCONTINENCE SURGERY  2016   LAPAROSCOPIC GASTRIC BANDING  2005   esophageal dilation. band still present but released   MASTOPEXY  2012   Patient Active Problem List   Diagnosis Date Noted   Pitting of nails 12/30/2022   Left hip pain 12/30/2022   Encounter for counseling 10/25/2022   Urinary incontinence 08/07/2022   Pneumonia due to infectious organism 08/07/2022   Perimenopausal disorder 08/07/2022   Atrophic vaginitis 08/07/2022   PFO (patent foramen ovale) 02/11/2021   RLS (restless legs syndrome) 02/11/2021   CAD (coronary artery disease) 02/11/2021   Rosacea 10/13/2020   Class 1 obesity due to excess calories with serious comorbidity and body mass index (BMI) of 34.0 to 34.9 in adult 09/11/2020   Loud snoring 09/11/2020   Sleep-related headache 09/11/2020   Recurrent UTI 12/26/2019   Shoulder pain, left  12/26/2019   Low back pain 06/28/2018   Cervical cancer screening 05/02/2017   Chronic migraine w/o aura w/o status migrainosus, not intractable 04/06/2017   Obesity 11/10/2016   Vitamin D deficiency 11/10/2016   Preventative health care 11/10/2016   History of chicken pox    Allergy    Anxiety and depression    Type 2 diabetes mellitus with obesity (HCC)    Hyperlipidemia     PCP: Danise Edge, MD  REFERRING PROVIDER: Rodolph Bong, MD  REFERRING DIAG: [id number] (ICD-10-CM) - Chronic neck pain  THERAPY DIAG:  Cervicalgia  Abnormal posture  Cramp and spasm  Muscle weakness (generalized)  Rationale for Evaluation and Treatment: Rehabilitation  ONSET DATE: chronic  SUBJECTIVE:  SUBJECTIVE STATEMENT: Over the last year I started having pain in the Rt side of the neck and it is more consistent over the last 3 mos.  It pops up to 30x in a row if I move the right way.  I can put my finger right on where it hurts.   Hand dominance: Right  PERTINENT HISTORY:  LBP - has had PT at this clinic and gets facet ablations Controlled migraines  PAIN:  PAIN:  Are you having pain? Yes NPRS scale: 3-6/10 Pain location: Right base of neck - I can put my finger on it Pain orientation: Right  PAIN TYPE: aching, dull, and tight Pain description: dull, aching, and stiff  constant low level, intermittent spikes of worse pain Aggravating factors: crocheting - I sit mermaid style and lean my head to the Rt and it gets stiff and sore, I can wake up with it hurting too Relieving factors: stretching, ibuprofen   PRECAUTIONS: None  RED FLAGS: None     WEIGHT BEARING RESTRICTIONS: No  FALLS:  Has patient fallen in last 6 months? No  LIVING ENVIRONMENT: Lives with: lives with their  spouse    OCCUPATION: full time MD with [employer]  PLOF: Independent  PATIENT GOALS: learn what to do to get ahead of it worsening  NEXT MD VISIT: after PT  OBJECTIVE:  Note: Objective measures were completed at Evaluation unless otherwise noted.  DIAGNOSTIC FINDINGS:  Cervical Xray: Report not available yet but MD note states DDD C5/6  PATIENT SURVEYS:  NDI 10/50, 20%  COGNITION: Overall cognitive status: Within functional limits for tasks assessed  SENSATION: WFL  POSTURE: forward head and hinge at C5/6  PALPATION: Tender at Right C5/6 facet and deep multifidus Tight band present in Rt>Lt upper trap   CERVICAL ROM:   Active ROM A/PROM (deg) eval  Flexion 65, stretch  Extension 60  Right lateral flexion 35, Rt p!  Left lateral flexion 30, Rt p!  Right rotation 70 p!  Left rotation 70   (Blank rows = not tested)  UPPER EXTREMITY ROM: Full without pain   UPPER EXTREMITY MMT: 5/5 with exception of Rt ER 4+/5, anterior neck flexors 4/5, intrascapular stabilizers 4/5  CERVICAL SPECIAL TESTS:  Spurling's test: Positive in fully closed position on Rt    TREATMENT DATE:  02/06/24        HEP initiated, see below                                                                                                                          PATIENT EDUCATION:  Education details: Person educated: Patient Education method: Explanation, Demonstration, and Handouts Education comprehension: verbalized understanding and returned demonstration  HOME EXERCISE PROGRAM: Access Code: URL: [email or web address] Date: 02/06/2024 Prepared by: Loistine Simas Ruweyda Macknight  Exercises - Supine Cervical Retraction with Towel  - 1 x daily - 7 x weekly - 1 sets - 10 reps - 10 hold - Seated  Isometric Cervical Rotation  - 1 x daily - 7 x weekly - 1 sets - 10 reps - 5 hold - Seated Isometric Cervical Sidebending  - 1 x daily - 7 x weekly  - 1 sets - 10 reps - 5 hold - Supine Shoulder Horizontal Abduction with Resistance  - 1 x daily - 7 x weekly - 3 sets - 10 reps - Supine Shoulder External Rotation with Resistance  - 1 x daily - 7 x weekly - 3 sets - 10 reps - Cervical Retraction with Resistance  - 1 x daily - 7 x weekly - 1 sets - 10 reps - 5 hold  ASSESSMENT:  CLINICAL IMPRESSION: Patient is a 53 y.o. female who was seen today for physical therapy evaluation and treatment for chronic neck pain. Pain is localized to Rt C5/6 facet joint and overlying soft tissues.  Pt has full neck ROM with lack of strength surrounding cervical spine, particularly anterior deep neck flexors.  She has some soft tissue tension and trigger points which will benefit from manual therapy techniques.  PT initiated HEP which was well tolerated and Pt reported feeling reduced pain end of evaluation.  She will benefit from skilled therapy to address evaluation findings and maximize functional tasks with improved postural awareness and strength.   OBJECTIVE IMPAIRMENTS: decreased mobility, decreased ROM, decreased strength, hypomobility, increased fascial restrictions, increased muscle spasms, impaired flexibility, impaired tone, improper body mechanics, postural dysfunction, and pain.   ACTIVITY LIMITATIONS: carrying, lifting, reach over head, and hygiene/grooming  PARTICIPATION LIMITATIONS: cleaning, laundry, driving, shopping, community activity, and occupation  PERSONAL FACTORS: Time since onset of injury/illness/exacerbation and chronic LBP  are also affecting patient's functional outcome.   REHAB POTENTIAL: Excellent  CLINICAL DECISION MAKING: Stable/uncomplicated  EVALUATION COMPLEXITY: Low   GOALS: Goals reviewed with patient? Yes  SHORT TERM GOALS: Target date: 03/05/24  Pt will be ind with initial HEP Baseline:  Goal status: INITIAL  2.  Pt will be compliant with more neutral positioning for crocheting to avoid closing mechanics and  pain at Rt C5/6 facet. Baseline:  Goal status: INITIAL  3.  Pt will report reduced neck pain by at least 50% with work and other daily tasks. Baseline:  Goal status: INITIAL    LONG TERM GOALS: Target date: 04/02/24  Pt will be ind with advanced HEP to maintain ROM, mobility and strength for postural support with daily tasks. Baseline:  Goal status: INITIAL  2.  Pt will achieve 5/5 strength and improved endurance of anterior neck flexors to support neutral posture with daily demands. Baseline:  Goal status: INITIAL  3.  Pt will improve NDI score to 10% or less to demo improved function Baseline: 20% (10/50) Goal status: INITIAL  4.   Pt will report reduced pain with work and crocheting by at least 75% Baseline:  Goal status: INITIAL  5.  Pt will improve bil shoulders and upper quadrants to 5/5 to allow for lifting and reaching tasks at home such as cleaning. Baseline:  Goal status: INITIAL     PLAN:  PT FREQUENCY: 1-2x/week  PT DURATION: 8 weeks  PLANNED INTERVENTIONS: 97110-Therapeutic exercises, 97530- Therapeutic activity, [id number]- Neuromuscular re-education, 97535- Self Care, 15176- Manual therapy, G0283- Electrical stimulation (unattended), [phone]- Electrical stimulation (manual), [id number]- Traction (mechanical), [id number]- Ionotophoresis 4mg /ml Dexamethasone, Taping, Dry Needling, Joint mobilization, Spinal manipulation, Spinal mobilization, Cryotherapy, and Moist heat  PLAN FOR NEXT SESSION: DN cervical spine (focal area of Rt C5/6), review and progress HEP, work on neck stabilization  strength with neuro re-ed "neck sit ups", scapular strength   Rever Pichette, PT 02/06/24 12:22 PM

## 2024-02-09 ENCOUNTER — Encounter: Payer: Self-pay | Admitting: Family Medicine

## 2024-02-09 NOTE — Progress Notes (Signed)
Cervical spine x-ray shows some arthritis.

## 2024-02-13 ENCOUNTER — Ambulatory Visit: Attending: Family Medicine | Admitting: Physical Therapy

## 2024-02-13 ENCOUNTER — Encounter: Payer: Self-pay | Admitting: Physical Therapy

## 2024-02-13 DIAGNOSIS — R293 Abnormal posture: Secondary | ICD-10-CM | POA: Insufficient documentation

## 2024-02-13 DIAGNOSIS — M6281 Muscle weakness (generalized): Secondary | ICD-10-CM | POA: Insufficient documentation

## 2024-02-13 DIAGNOSIS — M542 Cervicalgia: Secondary | ICD-10-CM | POA: Insufficient documentation

## 2024-02-13 DIAGNOSIS — R252 Cramp and spasm: Secondary | ICD-10-CM | POA: Diagnosis not present

## 2024-02-13 NOTE — Therapy (Signed)
 OUTPATIENT PHYSICAL THERAPY CERVICAL TREATMEMT   Patient Name: ROSETTA RUPNOW MRN: [id number] DOB:10-17-1971, 53 y.o., female Today's Date: 02/13/2024  END OF SESSION:  PT End of Session - 02/13/24 0757     Visit Number 2    Date for PT Re-Evaluation 04/02/24    Authorization Type [facility] [employer] Save    PT Start Time [phone]    PT Stop Time [phone]    PT Time Calculation (min) 59 min    Activity Tolerance Patient tolerated treatment well    Behavior During Therapy [facility] for tasks assessed/performed              Past Medical History:  Diagnosis Date   Allergy    Arthritis    "back"   Cervical cancer screening 05/02/2017   Depression    Depression with anxiety    Diabetes mellitus type 2 in obese    Diabetes mellitus without complication (HCC)    GERD (gastroesophageal reflux disease)    History of chicken pox    Hyperlipidemia    Low back pain    Migraines    Obesity 11/10/2016   PFO (patent foramen ovale)    Past Surgical History:  Procedure Laterality Date   ABDOMINOPLASTY  2012   AUGMENTATION MAMMAPLASTY  2014   maxilift with implants    INCONTINENCE SURGERY  2016   LAPAROSCOPIC GASTRIC BANDING  2005   esophageal dilation. band still present but released   MASTOPEXY  2012   Patient Active Problem List   Diagnosis Date Noted   Pitting of nails 12/30/2022   Left hip pain 12/30/2022   Encounter for counseling 10/25/2022   Urinary incontinence 08/07/2022   Pneumonia due to infectious organism 08/07/2022   Perimenopausal disorder 08/07/2022   Atrophic vaginitis 08/07/2022   PFO (patent foramen ovale) 02/11/2021   RLS (restless legs syndrome) 02/11/2021   CAD (coronary artery disease) 02/11/2021   Rosacea 10/13/2020   Class 1 obesity due to excess calories with serious comorbidity and body mass index (BMI) of 34.0 to 34.9 in adult 09/11/2020   Loud snoring 09/11/2020   Sleep-related headache 09/11/2020   Recurrent UTI 12/26/2019   Shoulder pain, left  12/26/2019   Low back pain 06/28/2018   Cervical cancer screening 05/02/2017   Chronic migraine w/o aura w/o status migrainosus, not intractable 04/06/2017   Obesity 11/10/2016   Vitamin D deficiency 11/10/2016   Preventative health care 11/10/2016   History of chicken pox    Allergy    Anxiety and depression    Type 2 diabetes mellitus with obesity (HCC)    Hyperlipidemia     PCP: Danise Edge, MD  REFERRING PROVIDER: Rodolph Bong, MD  REFERRING DIAG: [phone] (ICD-10-CM) - Chronic neck pain  THERAPY DIAG:  Cervicalgia  Abnormal posture  Cramp and spasm  Muscle weakness (generalized)  Rationale for Evaluation and Treatment: Rehabilitation  ONSET DATE: chronic  SUBJECTIVE:  SUBJECTIVE STATEMENT: I get immediate relief when I move out of the wrong position - I am more aware of my body mechanics.  I use the foam roller routine when I get home from work which gives a lot of release and relief.   I still wake up with some pain in the AM but it is not disrupting my sleep during the night.   Hand dominance: Right  PERTINENT HISTORY:  LBP - has had PT at this clinic and gets facet ablations Controlled migraines  PAIN:  PAIN:  Are you having pain? Yes NPRS scale: 4/10 Pain location: Right base of neck - I can put my finger on it Pain orientation: Right  PAIN TYPE: aching, dull, and tight Pain description: dull, aching, and stiff  constant low level, intermittent spikes of worse pain Aggravating factors: crocheting - I sit mermaid style and lean my head to the Rt and it gets stiff and sore, I can wake up with it hurting too Relieving factors: stretching, ibuprofen   PRECAUTIONS: None  RED FLAGS: None     WEIGHT BEARING RESTRICTIONS: No  FALLS:  Has patient  fallen in last 6 months? 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  DIAGNOSTIC FINDINGS:  Cervical Xray: Report not available yet but MD note states DDD C5/6  PATIENT SURVEYS:  NDI 10/50, 20%  COGNITION: Overall cognitive status: Within functional limits for tasks assessed  SENSATION: WFL  POSTURE: forward head and hinge at C5/6  PALPATION: Tender at Right C5/6 facet and deep multifidus Tight band present in Rt>Lt upper trap   CERVICAL ROM:   Active ROM A/PROM (deg) eval  Flexion 65, stretch  Extension 60  Right lateral flexion 35, Rt p!  Left lateral flexion 30, Rt p!  Right rotation 70 p!  Left rotation 70   (Blank rows = not tested)  UPPER EXTREMITY ROM: Full without pain   UPPER EXTREMITY MMT: 5/5 with exception of Rt ER 4+/5, anterior neck flexors 4/5, intrascapular stabilizers 4/5  CERVICAL SPECIAL TESTS:  Spurling's test: Positive in fully closed position on Rt    TREATMENT DATE:  02/13/24: UBE 2x2 fwd/bwd L2 with PT present to discuss status Supine chin nod 10x3" on soft foam roller  Supine blue tband: horiz abd x15, ER bil x10 with TA cue to knit ribcage Neuro re-ed: concentric chin nod into PT hands, hold isometric then eccentric neck flexion, concentric return - flexion and flexion with Lt Rot x 5 each, slow with control focus Seated neck flexion A/ROM focus on level eyes to maintain vertical axis of rotation x 3 bil SNAG - Lt Rot x15" Standing row blue tband x10 Trigger Point Dry Needling  Initial Treatment: Pt instructed on Dry Needling rational, procedures, and possible side effects. Pt instructed to expect mild to moderate muscle soreness later in the day  and/or into the next day.  Pt instructed in methods to reduce muscle soreness. Pt instructed to continue prescribed HEP. Patient was educated on signs and symptoms of infection and other risk factors and advised to seek medical attention should they occur.  Patient verbalized understanding of these instructions and education.   Patient Verbal Consent Given: Yes Education Handout Provided:  Previously Provided Muscles Treated: Rt upper trap and bil cervical mult C4-T2 Electrical Stimulation Performed: No Treatment Response/Outcome: significant twitch and release of Rt trap, deep ache and release of bil multifidi Prone: moist heat x10' end of session  02/06/24        HEP initiated, see below                                                                                                                          PATIENT EDUCATION:  Education details: Person educated: Patient Education method: Explanation, Demonstration, and Handouts Education comprehension: verbalized understanding and returned demonstration  HOME EXERCISE PROGRAM: Access Code: URL: [email or web address] Date: 02/06/2024 Prepared by: Loistine Simas Toshi Ishii  Exercises - Supine Cervical Retraction with Towel  - 1 x daily - 7 x weekly - 1 sets - 10 reps - 10 hold - Seated Isometric Cervical Rotation  - 1 x daily - 7 x weekly - 1 sets - 10 reps - 5 hold - Seated Isometric Cervical Sidebending  - 1 x daily - 7 x weekly - 1 sets - 10 reps - 5 hold - Supine Shoulder Horizontal Abduction with Resistance  - 1 x daily - 7 x weekly - 3 sets - 10 reps - Supine Shoulder External Rotation with Resistance  - 1 x daily - 7 x weekly - 3 sets - 10 reps - Cervical Retraction with Resistance  - 1 x daily - 7 x weekly - 1 sets - 10 reps - 5 hold  ASSESSMENT:  CLINICAL IMPRESSION: Pt expresses she is more mindful of proper alignment and body mechanics and uses pain as a cue to realign throughout the day.  Foam  roller decompression at end of work day helps.  PT progressed local mobility and strength today using manual resistance and verbal cues for progression of neck stabilization with good tolerance.  Progressed HEP to include GREEN band scapular strength.  Initiated TPDN today to cervical spine and Rt upper trap with signif release of tension.  Heat used for soreness.    OBJECTIVE IMPAIRMENTS: decreased mobility, decreased ROM, decreased strength, hypomobility, increased fascial restrictions, increased muscle spasms, impaired flexibility, impaired tone, improper body mechanics, postural dysfunction, and pain.   ACTIVITY LIMITATIONS: carrying, lifting, reach over head, and hygiene/grooming  PARTICIPATION LIMITATIONS: cleaning, laundry, driving, shopping, community activity, and occupation  PERSONAL FACTORS: Time since onset of injury/illness/exacerbation and chronic LBP  are also affecting patient's functional outcome.   REHAB POTENTIAL: Excellent  CLINICAL DECISION MAKING: Stable/uncomplicated  EVALUATION COMPLEXITY: Low   GOALS: Goals reviewed with patient? Yes  SHORT TERM GOALS: Target date: 03/05/24  Pt will be ind with initial HEP Baseline:  Goal status: ongoing 3/7  2.  Pt will be compliant with more neutral positioning for crocheting to avoid closing mechanics and pain at Rt C5/6 facet. Baseline:  Goal status: MET 3/7  3.  Pt will report reduced neck pain by at least 50% with work and other daily tasks.  Baseline:  Goal status: ONGOING 3/7    LONG TERM GOALS: Target date: 04/02/24  Pt will be ind with advanced HEP to maintain ROM, mobility and strength for postural support with daily tasks. Baseline:  Goal status: INITIAL  2.  Pt will achieve 5/5 strength and improved endurance of anterior neck flexors to support neutral posture with daily demands. Baseline:  Goal status: INITIAL  3.  Pt will improve NDI score to 10% or less to demo improved function Baseline: 20%  (10/50) Goal status: INITIAL  4.   Pt will report reduced pain with work and crocheting by at least 75% Baseline:  Goal status: INITIAL  5.  Pt will improve bil shoulders and upper quadrants to 5/5 to allow for lifting and reaching tasks at home such as cleaning. Baseline:  Goal status: INITIAL     PLAN:  PT FREQUENCY: 1-2x/week  PT DURATION: 8 weeks  PLANNED INTERVENTIONS: 97110-Therapeutic exercises, 97530- Therapeutic activity, [id number]- Neuromuscular re-education, 97535- Self Care, 16109- Manual therapy, G0283- Electrical stimulation (unattended), [phone]- Electrical stimulation (manual), [id number]- Traction (mechanical), [id number]- Ionotophoresis 4mg /ml Dexamethasone, Taping, Dry Needling, Joint mobilization, Spinal manipulation, Spinal mobilization, Cryotherapy, and Moist heat  PLAN FOR NEXT SESSION: f/u on DN cervical spine (focal area of Rt C5/6) and repeat as needed, review and progress HEP, work on neck stabilization strength with neuro re-ed "neck sit ups", scapular strength   Kylo Gavin, PT 02/13/24 12:21 PM

## 2024-02-20 ENCOUNTER — Ambulatory Visit: Payer: 59 | Admitting: Physical Therapy

## 2024-02-20 ENCOUNTER — Encounter: Payer: Self-pay | Admitting: Physical Therapy

## 2024-02-20 DIAGNOSIS — R252 Cramp and spasm: Secondary | ICD-10-CM

## 2024-02-20 DIAGNOSIS — M6281 Muscle weakness (generalized): Secondary | ICD-10-CM

## 2024-02-20 DIAGNOSIS — M542 Cervicalgia: Secondary | ICD-10-CM | POA: Diagnosis not present

## 2024-02-20 DIAGNOSIS — R293 Abnormal posture: Secondary | ICD-10-CM | POA: Diagnosis not present

## 2024-02-20 NOTE — Therapy (Signed)
 OUTPATIENT PHYSICAL THERAPY [facility]   Patient Name: Amanda James MRN: [id number] DOB:1971/05/11, 53 y.o., female Today's Date: 02/20/2024  END OF SESSION:  PT End of Session - 02/20/24 0923     Visit Number 3    Date for PT Re-Evaluation 04/02/24    Authorization Type [facility] [employer]    PT Start Time [phone]    PT Stop Time 1030    PT Time Calculation (min) 65 min    Activity Tolerance Patient tolerated treatment well    Behavior During Therapy [facility] for tasks assessed/performed               Past Medical History:  Diagnosis Date   Allergy    Arthritis    "back"   Cervical cancer screening 05/02/2017   Depression    Depression with anxiety    Diabetes mellitus type 2 in obese    Diabetes mellitus without complication (HCC)    GERD (gastroesophageal reflux disease)    History of chicken pox    Hyperlipidemia    Low back pain    Migraines    Obesity 11/10/2016   PFO (patent foramen ovale)    Past Surgical History:  Procedure Laterality Date   ABDOMINOPLASTY  2012   AUGMENTATION MAMMAPLASTY  2014   maxilift with implants    INCONTINENCE SURGERY  2016   LAPAROSCOPIC GASTRIC BANDING  2005   esophageal dilation. band still present but released   MASTOPEXY  2012   Patient Active Problem List   Diagnosis Date Noted   Pitting of nails 12/30/2022   Left hip pain 12/30/2022   Encounter for counseling 10/25/2022   Urinary incontinence 08/07/2022   Pneumonia due to infectious organism 08/07/2022   Perimenopausal disorder 08/07/2022   Atrophic vaginitis 08/07/2022   PFO (patent foramen ovale) 02/11/2021   RLS (restless legs syndrome) 02/11/2021   CAD (coronary artery disease) 02/11/2021   Rosacea 10/13/2020   Class 1 obesity due to excess calories with serious comorbidity and body mass index (BMI) of 34.0 to 34.9 in adult 09/11/2020   Loud snoring 09/11/2020   Sleep-related headache 09/11/2020   Recurrent UTI 12/26/2019   Shoulder pain, left  12/26/2019   Low back pain 06/28/2018   Cervical cancer screening 05/02/2017   Chronic migraine w/o aura w/o status migrainosus, not intractable 04/06/2017   Obesity 11/10/2016   Vitamin D deficiency 11/10/2016   Preventative health care 11/10/2016   History of chicken pox    Allergy    Anxiety and depression    Type 2 diabetes mellitus with obesity (HCC)    Hyperlipidemia     PCP: Danise Edge, MD  REFERRING PROVIDER: Rodolph Bong, MD  REFERRING DIAG: [phone] (ICD-10-CM) - Chronic neck pain  THERAPY DIAG:  Cervicalgia  Abnormal posture  Cramp and spasm  Muscle weakness (generalized)  Rationale for Evaluation and Treatment: Rehabilitation  ONSET DATE: chronic  SUBJECTIVE:  SUBJECTIVE STATEMENT: The needling took a few days to recover from with the soreness.  I am continuing to work on my posture.  I am working on figuring out my glasses and vision with my crocheting so I can be in better posture while working.     Hand dominance: Right  PERTINENT HISTORY:  LBP - has had PT at this clinic and gets facet ablations Controlled migraines  PAIN:  PAIN:  Are you having pain? Yes NPRS scale: 4/10 Pain location: Right base of neck - I can put my finger on it Pain orientation: Right  PAIN TYPE: aching, dull, and tight Pain description: dull, aching, and stiff  constant low level, intermittent spikes of worse pain Aggravating factors: crocheting - I sit mermaid style and lean my head to the Rt and it gets stiff and sore, I can wake up with it hurting too Relieving factors: stretching, ibuprofen   PRECAUTIONS: None  RED FLAGS: None     WEIGHT BEARING RESTRICTIONS: No  FALLS:  Has patient fallen in last 6 months? No  LIVING ENVIRONMENT: Lives with: lives with  their spouse    OCCUPATION: full time MD with [employer]  PLOF: Independent  PATIENT GOALS: learn what to do to get ahead of it worsening  NEXT MD VISIT: after PT  OBJECTIVE:  Note: Objective measures were completed at Evaluation unless otherwise noted.  DIAGNOSTIC FINDINGS:  Cervical Xray: Report not available yet but MD note states DDD C5/6  PATIENT SURVEYS:  NDI 10/50, 20%  COGNITION: Overall cognitive status: Within functional limits for tasks assessed  SENSATION: WFL  POSTURE: forward head and hinge at C5/6  PALPATION: Tender at Right C5/6 facet and deep multifidus Tight band present in Rt>Lt upper trap   CERVICAL ROM:   Active ROM A/PROM (deg) eval  Flexion 65, stretch  Extension 60  Right lateral flexion 35, Rt p!  Left lateral flexion 30, Rt p!  Right rotation 70 p!  Left rotation 70   (Blank rows = not tested)  UPPER EXTREMITY ROM: Full without pain   UPPER EXTREMITY MMT: 5/5 with exception of Rt ER 4+/5, anterior neck flexors 4/5, intrascapular stabilizers 4/5  CERVICAL SPECIAL TESTS:  Spurling's test: Positive in fully closed position on Rt    TREATMENT DATE:  02/20/24 UBE L4 2x2 PT present to discuss status and plan for session Standing green tband: standing row low arm and wide elbow x10 each Standing reverse fly with yellow band low anchor at waist height Lower trap setting at wall x10 - PT provided TC and VC for proper recruitment Blue loop band scapular strength 2-way reaches 5x each bil Wall plank on elbows with SA scapular protraction and neck retraction 3x10" Quadruped neck retraction 3x10" Supine neck flexion and rotation on blue soft foam roller x10 Supine neck flexion with overpressure stretch, bias Lt and Rt Upper thoracic extension over blue foam roller at multiple spots - best in upper t-spine Trigger Point Dry Needling  Subsequent Treatment: Instructions provided previously at initial dry needling  treatment.   Patient Verbal Consent Given: Yes Education Handout Provided: Previously Provided Muscles Treated: bil upper traps Electrical Stimulation Performed: No Treatment Response/Outcome: signif reduction in tension and bunching of muscle especially Rt upper trap Moist heat x10' end of session  02/13/24: UBE 2x2 fwd/bwd L2 with PT present to discuss status Supine chin nod 10x3" on soft foam roller  Supine blue tband: horiz abd x15, ER bil x10 with TA cue  to knit ribcage Neuro re-ed: concentric chin nod into PT hands, hold isometric then eccentric neck flexion, concentric return - flexion and flexion with Lt Rot x 5 each, slow with control focus Seated neck flexion A/ROM focus on level eyes to maintain vertical axis of rotation x 3 bil SNAG - Lt Rot x15" Standing row blue tband x10 Trigger Point Dry Needling  Initial Treatment: Pt instructed on Dry Needling rational, procedures, and possible side effects. Pt instructed to expect mild to moderate muscle soreness later in the day and/or into the next day.  Pt instructed in methods to reduce muscle soreness. Pt instructed to continue prescribed HEP. Patient was educated on signs and symptoms of infection and other risk factors and advised to seek medical attention should they occur.  Patient verbalized understanding of these instructions and education.   Patient Verbal Consent Given: Yes Education Handout Provided: Previously Provided Muscles Treated: Rt upper trap and bil cervical mult C4-T2 Electrical Stimulation Performed: No Treatment Response/Outcome: significant twitch and release of Rt trap, deep ache and release of bil multifidi Prone: moist heat x10' end of session  02/06/24        HEP initiated, see below                                                                                                                          PATIENT EDUCATION:  Education details: Person educated: Patient Education method:  Explanation, Demonstration, and Handouts Education comprehension: verbalized understanding and returned demonstration  HOME EXERCISE PROGRAM: Access Code: URL: [email or web address] Date: 02/20/2024 Prepared by: Loistine Simas Dash Cardarelli  Exercises - Supine Cervical Retraction with Towel  - 1 x daily - 7 x weekly - 1 sets - 10 reps - 10 hold - Seated Isometric Cervical Rotation  - 1 x daily - 7 x weekly - 1 sets - 10 reps - 5 hold - Seated Isometric Cervical Sidebending  - 1 x daily - 7 x weekly - 1 sets - 10 reps - 5 hold - Supine Shoulder Horizontal Abduction with Resistance  - 1 x daily - 7 x weekly - 3 sets - 10 reps - Supine Shoulder External Rotation with Resistance  - 1 x daily - 7 x weekly - 3 sets - 10 reps - Cervical Retraction with Resistance  - 1 x daily - 7 x weekly - 1 sets - 10 reps - 5 hold - Supine Shoulder External Rotation with Resistance  - 1 x daily - 7 x weekly - 2 sets - 10 reps - Standing Row with Anchored Resistance  - 1 x daily - 7 x weekly - 2 sets - 10 reps - Reverse Fly with Anchored Resistance  - 1 x daily - 7 x weekly - 2 sets - 10 reps - Low Trap Setting at Wall  - 1 x daily - 7 x weekly - 2 sets - 10 reps - Forearm Plank on Wall  - 1 x daily - 7 x  weekly - 1 sets - 3-5 reps - 10 hold  ASSESSMENT:  CLINICAL IMPRESSION: Pt continues to be more mindful of positioning with work and hobbies.  She is planning to try various glasses while crocheting to allow neutral neck posture.  She was able to progress her scapular strengthing today to improve recruitment and use of middle and lower traps today.  We discussed how this will be a precursor to progressing into functional movements.  Pt displayed less cervical tenderness with improved carry over of soft tissue release from last visit in deep multifidi but did have ongoing TP in Rt>>Lt upper trap today.  Twitch and release noted bil with DN treatment.  Updated HEP today.  OBJECTIVE IMPAIRMENTS:  decreased mobility, decreased ROM, decreased strength, hypomobility, increased fascial restrictions, increased muscle spasms, impaired flexibility, impaired tone, improper body mechanics, postural dysfunction, and pain.   ACTIVITY LIMITATIONS: carrying, lifting, reach over head, and hygiene/grooming  PARTICIPATION LIMITATIONS: cleaning, laundry, driving, shopping, community activity, and occupation  PERSONAL FACTORS: Time since onset of injury/illness/exacerbation and chronic LBP  are also affecting patient's functional outcome.   REHAB POTENTIAL: Excellent  CLINICAL DECISION MAKING: Stable/uncomplicated  EVALUATION COMPLEXITY: Low   GOALS: Goals reviewed with patient? Yes  SHORT TERM GOALS: Target date: 03/05/24  Pt will be ind with initial HEP Baseline:  Goal status: MET 3/14  2.  Pt will be compliant with more neutral positioning for crocheting to avoid closing mechanics and pain at Rt C5/6 facet. Baseline:  Goal status: MET 3/7  3.  Pt will report reduced neck pain by at least 50% with work and other daily tasks. Baseline:  Goal status: ONGOING 3/7    LONG TERM GOALS: Target date: 04/02/24  Pt will be ind with advanced HEP to maintain ROM, mobility and strength for postural support with daily tasks. Baseline:  Goal status: INITIAL  2.  Pt will achieve 5/5 strength and improved endurance of anterior neck flexors to support neutral posture with daily demands. Baseline:  Goal status: INITIAL  3.  Pt will improve NDI score to 10% or less to demo improved function Baseline: 20% (10/50) Goal status: INITIAL  4.   Pt will report reduced pain with work and crocheting by at least 75% Baseline:  Goal status: INITIAL  5.  Pt will improve bil shoulders and upper quadrants to 5/5 to allow for lifting and reaching tasks at home such as cleaning. Baseline:  Goal status: INITIAL     PLAN:  PT FREQUENCY: 1-2x/week  PT DURATION: 8 weeks  PLANNED INTERVENTIONS:  97110-Therapeutic exercises, 97530- Therapeutic activity, [id number]- Neuromuscular re-education, 97535- Self Care, 16109- Manual therapy, [id number]- Electrical stimulation (unattended), [phone]- Electrical stimulation (manual), [id number]- Traction (mechanical), [phone]- Ionotophoresis 4mg /ml Dexamethasone, Taping, Dry Needling, Joint mobilization, Spinal manipulation, Spinal mobilization, Cryotherapy, and Moist heat  PLAN FOR NEXT SESSION: review scapular strength from last visit, progress to functional movement application, DN cervical spine (focal area of Rt C5/6) and repeat as needed, review and progress HEP, work on neck stabilization strength with neuro re-ed "neck sit ups"   Abad Manard, PT 02/20/24 12:05 PM

## 2024-02-23 ENCOUNTER — Encounter: Payer: Self-pay | Admitting: Physical Therapy

## 2024-02-27 ENCOUNTER — Ambulatory Visit: Payer: Self-pay | Admitting: Physical Therapy

## 2024-02-27 ENCOUNTER — Encounter: Payer: Self-pay | Admitting: Physical Therapy

## 2024-02-27 ENCOUNTER — Ambulatory Visit: Admitting: Physical Therapy

## 2024-02-27 DIAGNOSIS — M542 Cervicalgia: Secondary | ICD-10-CM | POA: Diagnosis not present

## 2024-02-27 DIAGNOSIS — R293 Abnormal posture: Secondary | ICD-10-CM | POA: Diagnosis not present

## 2024-02-27 DIAGNOSIS — M6281 Muscle weakness (generalized): Secondary | ICD-10-CM | POA: Diagnosis not present

## 2024-02-27 DIAGNOSIS — R252 Cramp and spasm: Secondary | ICD-10-CM | POA: Diagnosis not present

## 2024-02-27 NOTE — Therapy (Signed)
 OUTPATIENT PHYSICAL THERAPY [facility]   Patient Name: Amanda James MRN: [id number] DOB:05/08/1971, 53 y.o., female Today's Date: 02/27/2024  END OF SESSION:  PT End of Session - 02/27/24 0851     Visit Number 4    Date for PT Re-Evaluation 04/02/24    Authorization Type [facility] [employer]    PT Start Time [phone]    PT Stop Time [phone]    PT Time Calculation (min) 43 min    Activity Tolerance Patient tolerated treatment well    Behavior During Therapy [facility] for tasks assessed/performed                Past Medical History:  Diagnosis Date   Allergy    Arthritis    "back"   Cervical cancer screening 05/02/2017   Depression    Depression with anxiety    Diabetes mellitus type 2 in obese    Diabetes mellitus without complication (HCC)    GERD (gastroesophageal reflux disease)    History of chicken pox    Hyperlipidemia    Low back pain    Migraines    Obesity 11/10/2016   PFO (patent foramen ovale)    Past Surgical History:  Procedure Laterality Date   ABDOMINOPLASTY  2012   AUGMENTATION MAMMAPLASTY  2014   maxilift with implants    INCONTINENCE SURGERY  2016   LAPAROSCOPIC GASTRIC BANDING  2005   esophageal dilation. band still present but released   MASTOPEXY  2012   Patient Active Problem List   Diagnosis Date Noted   Pitting of nails 12/30/2022   Left hip pain 12/30/2022   Encounter for counseling 10/25/2022   Urinary incontinence 08/07/2022   Pneumonia due to infectious organism 08/07/2022   Perimenopausal disorder 08/07/2022   Atrophic vaginitis 08/07/2022   PFO (patent foramen ovale) 02/11/2021   RLS (restless legs syndrome) 02/11/2021   CAD (coronary artery disease) 02/11/2021   Rosacea 10/13/2020   Class 1 obesity due to excess calories with serious comorbidity and body mass index (BMI) of 34.0 to 34.9 in adult 09/11/2020   Loud snoring 09/11/2020   Sleep-related headache 09/11/2020   Recurrent UTI 12/26/2019   Shoulder pain, left  12/26/2019   Low back pain 06/28/2018   Cervical cancer screening 05/02/2017   Chronic migraine w/o aura w/o status migrainosus, not intractable 04/06/2017   Obesity 11/10/2016   Vitamin D deficiency 11/10/2016   Preventative health care 11/10/2016   History of chicken pox    Allergy    Anxiety and depression    Type 2 diabetes mellitus with obesity (HCC)    Hyperlipidemia     PCP: Danise Edge, MD  REFERRING PROVIDER: Rodolph Bong, MD  REFERRING DIAG: [phone] (ICD-10-CM) - Chronic neck pain  THERAPY DIAG:  Cervicalgia  Abnormal posture  Cramp and spasm  Muscle weakness (generalized)  Rationale for Evaluation and Treatment: Rehabilitation  ONSET DATE: chronic  SUBJECTIVE:  SUBJECTIVE STATEMENT: I had soreness the day of last treatment but then the next day I had no pain at all.  As the week went on the pain increased though.   Hand dominance: Right  PERTINENT HISTORY:  LBP - has had PT at this clinic and gets facet ablations Controlled migraines  PAIN:  PAIN:  Are you having pain? Yes NPRS scale: 3/10 Pain location: Right base of neck - I can put my finger on it Pain orientation: Right  PAIN TYPE: aching, dull, and tight Pain description: dull, aching, and stiff  constant low level, intermittent spikes of worse pain Aggravating factors: crocheting - I sit mermaid style and lean my head to the Rt and it gets stiff and sore, I can wake up with it hurting too Relieving factors: stretching, ibuprofen   PRECAUTIONS: None  RED FLAGS: None     WEIGHT BEARING RESTRICTIONS: No  FALLS:  Has patient fallen in last 6 months? No  LIVING ENVIRONMENT: Lives with: lives with their spouse    OCCUPATION: full time MD with [employer]  PLOF: Independent  PATIENT GOALS: learn what to do to get ahead of it worsening  NEXT MD VISIT: after PT  OBJECTIVE:  Note: Objective measures were completed at Evaluation unless otherwise noted.  DIAGNOSTIC FINDINGS:  Cervical Xray: Report not available yet but MD note states DDD C5/6  PATIENT SURVEYS:  NDI 10/50, 20%  COGNITION: Overall cognitive status: Within functional limits for tasks assessed  SENSATION: WFL  POSTURE: forward head and hinge at C5/6  PALPATION: Tender at Right C5/6 facet and deep multifidus Tight band present in Rt>Lt upper trap   CERVICAL ROM:   Active ROM A/PROM (deg) eval  Flexion 65, stretch  Extension 60  Right lateral flexion 35, Rt p!  Left lateral flexion 30, Rt p!  Right rotation 70 p!  Left rotation 70   (Blank rows = not tested)  UPPER EXTREMITY ROM: Full without pain   UPPER EXTREMITY MMT: 5/5 with exception of Rt ER 4+/5, anterior neck flexors 4/5, intrascapular stabilizers 4/5  CERVICAL SPECIAL TESTS:  Spurling's test: Positive in fully closed position on Rt    TREATMENT DATE:  02/27/24: UBE L4 2.5x2.5 fwd/bwd PT present to discuss status Supine neck retraction and retraction with bil rotation x 5 each on soft foam roller as pillow Neck sit ups and sit ups with rotation x5 each - needed TC with Rt rotation for control Standing green band to fatigue: horiz abd and lower trap field goal arm rows Bent over 1lb shoulder extension, abd and Y to fatigue each, bil Neck flexion stretch with OP 2x20" Wall plank on elbows with SA press 2x20" Y wall lift offs for lower trap setting 2x5 Trigger Point Dry Needling Subsequent Treatment: Instructions provided previously at initial dry needling treatment.   Patient Verbal Consent Given: Yes Education Handout Provided: Previously Provided Muscles Treated: right upper trap at cervical intersection, deep multifidi C5-C7 on Rt Electrical Stimulation Performed:  No Treatment Response/Outcome: signif reduction in tension and bunching of muscle especially Rt upper trap  02/20/24 UBE L4 2x2 PT present to discuss status and plan for session Standing green tband: standing row low arm and wide elbow x10 each Standing reverse fly with yellow band low anchor at waist height Lower trap setting at wall x10 - PT provided TC and VC for proper recruitment Blue loop band scapular strength 2-way reaches 5x each bil Wall plank on elbows with SA  scapular protraction and neck retraction 3x10" Quadruped neck retraction 3x10" Supine neck flexion and rotation on blue soft foam roller x10 Supine neck flexion with overpressure stretch, bias Lt and Rt Upper thoracic extension over blue foam roller at multiple spots - best in upper t-spine Trigger Point Dry Needling Subsequent Treatment: Instructions provided previously at initial dry needling treatment.   Patient Verbal Consent Given: Yes Education Handout Provided: Previously Provided Muscles Treated: bil upper traps Electrical Stimulation Performed: No Treatment Response/Outcome: signif reduction in tension and bunching of muscle especially Rt upper trap Moist heat x10' end of session  02/13/24: UBE 2x2 fwd/bwd L2 with PT present to discuss status Supine chin nod 10x3" on soft foam roller  Supine blue tband: horiz abd x15, ER bil x10 with TA cue to knit ribcage Neuro re-ed: concentric chin nod into PT hands, hold isometric then eccentric neck flexion, concentric return - flexion and flexion with Lt Rot x 5 each, slow with control focus Seated neck flexion A/ROM focus on level eyes to maintain vertical axis of rotation x 3 bil SNAG - Lt Rot x15" Standing row blue tband x10 Trigger Point Dry Needling Initial Treatment: Pt instructed on Dry Needling rational, procedures, and possible side effects. Pt instructed to expect mild to moderate muscle soreness later in the day and/or into the next day.  Pt instructed in  methods to reduce muscle soreness. Pt instructed to continue prescribed HEP. Patient was educated on signs and symptoms of infection and other risk factors and advised to seek medical attention should they occur.  Patient verbalized understanding of these instructions and education.   Patient Verbal Consent Given: Yes Education Handout Provided: Previously Provided Muscles Treated: Rt upper trap and bil cervical mult C4-T2 Electrical Stimulation Performed: No Treatment Response/Outcome: significant twitch and release of Rt trap, deep ache and release of bil multifidi Prone: moist heat x10' end of session   PATIENT EDUCATION:  Education details: Person educated: Patient Education method: Explanation, Demonstration, and Handouts Education comprehension: verbalized understanding and returned demonstration  HOME EXERCISE PROGRAM: Access Code: URL: [email or web address] Date: 02/20/2024 Prepared by: Loistine Simas Shaivi Rothschild  Exercises - Supine Cervical Retraction with Towel  - 1 x daily - 7 x weekly - 1 sets - 10 reps - 10 hold - Seated Isometric Cervical Rotation  - 1 x daily - 7 x weekly - 1 sets - 10 reps - 5 hold - Seated Isometric Cervical Sidebending  - 1 x daily - 7 x weekly - 1 sets - 10 reps - 5 hold - Supine Shoulder Horizontal Abduction with Resistance  - 1 x daily - 7 x weekly - 3 sets - 10 reps - Supine Shoulder External Rotation with Resistance  - 1 x daily - 7 x weekly - 3 sets - 10 reps - Cervical Retraction with Resistance  - 1 x daily - 7 x weekly - 1 sets - 10 reps - 5 hold - Supine Shoulder External Rotation with Resistance  - 1 x daily - 7 x weekly - 2 sets - 10 reps - Standing Row with Anchored Resistance  - 1 x daily - 7 x weekly - 2 sets - 10 reps - Reverse Fly with Anchored Resistance  - 1 x daily - 7 x weekly - 2 sets - 10 reps - Low Trap Setting at Wall  - 1 x daily - 7 x weekly - 2 sets - 10 reps - Forearm Plank on Wall  - 1 x daily -  7 x weekly - 1 sets - 3-5 reps - 10 hold  ASSESSMENT:  CLINICAL IMPRESSION: Pt had two painfree days after last session with slow return of pain throughout the week.  Pt able to progress anti-gravity therex today but quick to fatigue.  She continues to have overuse of Rt upper trap secondary to lack of control at C5/6.  Signif twitch and release of tension when needling upper trap close to cervical intersection today.    OBJECTIVE IMPAIRMENTS: decreased mobility, decreased ROM, decreased strength, hypomobility, increased fascial restrictions, increased muscle spasms, impaired flexibility, impaired tone, improper body mechanics, postural dysfunction, and pain.   ACTIVITY LIMITATIONS: carrying, lifting, reach over head, and hygiene/grooming  PARTICIPATION LIMITATIONS: cleaning, laundry, driving, shopping, community activity, and occupation  PERSONAL FACTORS: Time since onset of injury/illness/exacerbation and chronic LBP  are also affecting patient's functional outcome.   REHAB POTENTIAL: Excellent  CLINICAL DECISION MAKING: Stable/uncomplicated  EVALUATION COMPLEXITY: Low   GOALS: Goals reviewed with patient? Yes  SHORT TERM GOALS: Target date: 03/05/24  Pt will be ind with initial HEP Baseline:  Goal status: MET 3/14  2.  Pt will be compliant with more neutral positioning for crocheting to avoid closing mechanics and pain at Rt C5/6 facet. Baseline:  Goal status: MET 3/7  3.  Pt will report reduced neck pain by at least 50% with work and other daily tasks. Baseline:  Goal status: ongoing, 25% 02/27/24    LONG TERM GOALS: Target date: 04/02/24  Pt will be ind with advanced HEP to maintain ROM, mobility and strength for postural support with daily tasks. Baseline:  Goal status: INITIAL  2.  Pt will achieve 5/5 strength and improved endurance of anterior neck flexors to support neutral posture with daily demands. Baseline:  Goal status: INITIAL  3.  Pt will improve NDI  score to 10% or less to demo improved function Baseline: 20% (10/50) Goal status: INITIAL  4.   Pt will report reduced pain with work and crocheting by at least 75% Baseline:  Goal status: INITIAL  5.  Pt will improve bil shoulders and upper quadrants to 5/5 to allow for lifting and reaching tasks at home such as cleaning. Baseline:  Goal status: INITIAL     PLAN:  PT FREQUENCY: 1-2x/week  PT DURATION: 8 weeks  PLANNED INTERVENTIONS: 97110-Therapeutic exercises, 97530- Therapeutic activity, [id number]- Neuromuscular re-education, 97535- Self Care, [id number]- Manual therapy, [id number]- Electrical stimulation (unattended), [phone]- Electrical stimulation (manual), [id number]- Traction (mechanical), [phone]- Ionotophoresis 4mg /ml Dexamethasone, Taping, Dry Needling, Joint mobilization, Spinal manipulation, Spinal mobilization, Cryotherapy, and Moist heat  PLAN FOR NEXT SESSION: review scapular strength from last visit, progress to functional movement application, DN cervical spine (focal area of Rt C5/6) and repeat as needed, review and progress HEP, work on neck stabilization strength with neuro re-ed "neck sit ups"   Abbi Mancini, PT 02/27/24 9:35 AM

## 2024-03-05 ENCOUNTER — Encounter: Payer: Self-pay | Admitting: Physical Therapy

## 2024-03-05 ENCOUNTER — Ambulatory Visit: Payer: 59 | Admitting: Physical Therapy

## 2024-03-05 DIAGNOSIS — R293 Abnormal posture: Secondary | ICD-10-CM | POA: Diagnosis not present

## 2024-03-05 DIAGNOSIS — M542 Cervicalgia: Secondary | ICD-10-CM

## 2024-03-05 DIAGNOSIS — R252 Cramp and spasm: Secondary | ICD-10-CM

## 2024-03-05 DIAGNOSIS — M6281 Muscle weakness (generalized): Secondary | ICD-10-CM | POA: Diagnosis not present

## 2024-03-05 NOTE — Therapy (Signed)
 OUTPATIENT PHYSICAL THERAPY CERVICAL [facility]REATMEMT   Patient Name: Amanda James MRN: [id number] DOB:[birth year], 53 y.o., female Today's Date: 03/05/2024  END OF SESSION:  PT End of Session - 03/05/24 0935     Visit Number 5    Date for PT Re-Evaluation 04/02/24    Authorization Type [place] [employer] Save    PT Start Time 0930    PT Stop Time 1015    PT Time Calculation (min) 45 min    Activity Tolerance Patient tolerated treatment well    Behavior During Therapy [facility] for tasks assessed/performed                 Past Medical History:  Diagnosis Date   Allergy    Arthritis    "back"   Cervical cancer screening 05/02/2017   Depression    Depression with anxiety    Diabetes mellitus type 2 in obese    Diabetes mellitus without complication (HCC)    GERD (gastroesophageal reflux disease)    History of chicken pox    Hyperlipidemia    Low back pain    Migraines    Obesity 11/10/2016   PFO (patent foramen ovale)    Past Surgical History:  Procedure Laterality Date   ABDOMINOPLASTY  2012   AUGMENTATION MAMMAPLASTY  2014   maxilift with implants    INCONTINENCE SURGERY  2016   LAPAROSCOPIC GASTRIC BANDING  2005   esophageal dilation. band still present but released   MASTOPEXY  2012   Patient Active Problem List   Diagnosis Date Noted   Pitting of nails 12/30/2022   Left hip pain 12/30/2022   Encounter for counseling 10/25/2022   Urinary incontinence 08/07/2022   Pneumonia due to infectious organism 08/07/2022   Perimenopausal disorder 08/07/2022   Atrophic vaginitis 08/07/2022   PFO (patent foramen ovale) 02/11/2021   RLS (restless legs syndrome) 02/11/2021   CAD (coronary artery disease) 02/11/2021   Rosacea 10/13/2020   Class 1 obesity due to excess calories with serious comorbidity and body mass index (BMI) of 34.0 to 34.9 in adult 09/11/2020   Loud snoring 09/11/2020   Sleep-related headache 09/11/2020   Recurrent UTI 12/26/2019   Shoulder pain,  left 12/26/2019   Low back pain 06/28/2018   Cervical cancer screening 05/02/2017   Chronic migraine w/o aura w/o status migrainosus, not intractable 04/06/2017   Obesity 11/10/2016   Vitamin D deficiency 11/10/2016   Preventative health care 11/10/2016   History of chicken pox    Allergy    Anxiety and depression    Type 2 diabetes mellitus with obesity (HCC)    Hyperlipidemia     PCP: Danise Edge, MD  REFERRING PROVIDER: Rodolph Bong, MD  REFERRING DIAG: [phone] (ICD-10-CM) - Chronic neck pain  THERAPY DIAG:  Cervicalgia  Abnormal posture  Cramp and spasm  Muscle weakness (generalized)  Rationale for Evaluation and Treatment: Rehabilitation  ONSET DATE: chronic  SUBJECTIVE:  SUBJECTIVE STATEMENT: I had less soreness after last time and the pain at the base of the right side of neck is pretty much gone.  Still having pain along Rt>Lt sides of neck.  I am 50% better.  Crocheting is going better too but I have to keep reminding myself how to sit.   Hand dominance: Right  PERTINENT HISTORY:  LBP - has had PT at this clinic and gets facet ablations Controlled migraines  PAIN:  PAIN:  Are you having pain? Yes NPRS scale: 2/10 Pain location: lateral neck bil Pain orientation: Right  PAIN TYPE: aching, dull, and tight Pain description: dull, aching, and stiff  constant low level, intermittent spikes of worse pain Aggravating factors: crocheting - I sit mermaid style and lean my head to the Rt and it gets stiff and sore, I can wake up with it hurting too Relieving factors: stretching, ibuprofen   PRECAUTIONS: None  RED FLAGS: None     WEIGHT BEARING RESTRICTIONS: No  FALLS:  Has patient fallen in last 6 months? No  LIVING ENVIRONMENT: Lives with: lives  with their spouse    OCCUPATION: full time MD with [employer]  PLOF: Independent  PATIENT GOALS: learn what to do to get ahead of it worsening  NEXT MD VISIT: after PT  OBJECTIVE:  Note: Objective measures were completed at Evaluation unless otherwise noted.  DIAGNOSTIC FINDINGS:  Cervical Xray: Report not available yet but MD note states DDD C5/6  PATIENT SURVEYS:  NDI 10/50, 20%  COGNITION: Overall cognitive status: Within functional limits for tasks assessed  SENSATION: WFL  POSTURE: forward head and hinge at C5/6  PALPATION: Tender at Right C5/6 facet and deep multifidus Tight band present in Rt>Lt upper trap   CERVICAL ROM:   Active ROM A/PROM (deg) eval  Flexion 65, stretch  Extension 60  Right lateral flexion 35, Rt p!  Left lateral flexion 30, Rt p!  Right rotation 70 p!  Left rotation 70   (Blank rows = not tested)  UPPER EXTREMITY ROM: Full without pain   UPPER EXTREMITY MMT: 5/5 with exception of Rt ER 4+/5, anterior neck flexors 4/5, intrascapular stabilizers 4/5  CERVICAL SPECIAL TESTS:  Spurling's test: Positive in fully closed position on Rt    TREATMENT DATE:  03/05/24: UBE L4 2.5x2.5 fwd/bwd PT present to discuss status Seated neck retraction into towel 5x5" holds - 2 rounds Seated SB isometrics 5x5" - VC to use head into hand vs hand into head Standing 3-way 2lb bil UE raise to 90 deg x8 rounds  Seated reverse sit up with 5lb kbell press x10 Sit to stand with 5lb OH press - VC for hip hinge and neutral neck posture SL open book x8 each side with knees flexed to protect lumbar spine Quadruped neck retraction with alt UE reach x10 Supine neck on soft foam roller feet in 90/90 on green ball - OH bil 3lb flexion x10 Neck sit ups 5x straight and 3x each with rotation Y lift off lower trap setting from wall x10 Yellow tied loop 3-way scapular reaches on wall x5 cycles each   02/27/24: UBE L4 2.5x2.5 fwd/bwd PT  present to discuss status Supine neck retraction and retraction with bil rotation x 5 each on soft foam roller as pillow Neck sit ups and sit ups with rotation x5 each - needed TC with Rt rotation for control Standing green band to fatigue: horiz abd and lower trap field goal arm rows Bent over  1lb shoulder extension, abd and Y to fatigue each, bil Neck flexion stretch with OP 2x20" Wall plank on elbows with SA press 2x20" Y wall lift offs for lower trap setting 2x5 Trigger Point Dry Needling Subsequent Treatment: Instructions provided previously at initial dry needling treatment.  Patient Verbal Consent Given: Yes Education Handout Provided: Previously Provided Muscles Treated: right upper trap at cervical intersection, deep multifidi C5-C7 on Rt Electrical Stimulation Performed: No Treatment Response/Outcome: signif reduction in tension and bunching of muscle especially Rt upper trap  02/20/24 UBE L4 2x2 PT present to discuss status and plan for session Standing green tband: standing row low arm and wide elbow x10 each Standing reverse fly with yellow band low anchor at waist height Lower trap setting at wall x10 - PT provided TC and VC for proper recruitment Blue loop band scapular strength 2-way reaches 5x each bil Wall plank on elbows with SA scapular protraction and neck retraction 3x10" Quadruped neck retraction 3x10" Supine neck flexion and rotation on blue soft foam roller x10 Supine neck flexion with overpressure stretch, bias Lt and Rt Upper thoracic extension over blue foam roller at multiple spots - best in upper t-spine Trigger Point Dry Needling Subsequent Treatment: Instructions provided previously at initial dry needling treatment.   Patient Verbal Consent Given: Yes Education Handout Provided: Previously Provided Muscles Treated: bil upper traps Electrical Stimulation Performed: No Treatment Response/Outcome: signif reduction in tension and bunching of muscle  especially Rt upper trap Moist heat x10' end of session    PATIENT EDUCATION:  Education details: Person educated: Patient Education method: Explanation, Demonstration, and Handouts Education comprehension: verbalized understanding and returned demonstration  HOME EXERCISE PROGRAM: Access Code: URL: [email or web address] Date: 02/20/2024 Prepared by: Loistine Simas Malina Geers  Exercises - Supine Cervical Retraction with Towel  - 1 x daily - 7 x weekly - 1 sets - 10 reps - 10 hold - Seated Isometric Cervical Rotation  - 1 x daily - 7 x weekly - 1 sets - 10 reps - 5 hold - Seated Isometric Cervical Sidebending  - 1 x daily - 7 x weekly - 1 sets - 10 reps - 5 hold - Supine Shoulder Horizontal Abduction with Resistance  - 1 x daily - 7 x weekly - 3 sets - 10 reps - Supine Shoulder External Rotation with Resistance  - 1 x daily - 7 x weekly - 3 sets - 10 reps - Cervical Retraction with Resistance  - 1 x daily - 7 x weekly - 1 sets - 10 reps - 5 hold - Supine Shoulder External Rotation with Resistance  - 1 x daily - 7 x weekly - 2 sets - 10 reps - Standing Row with Anchored Resistance  - 1 x daily - 7 x weekly - 2 sets - 10 reps - Reverse Fly with Anchored Resistance  - 1 x daily - 7 x weekly - 2 sets - 10 reps - Low Trap Setting at Wall  - 1 x daily - 7 x weekly - 2 sets - 10 reps - Forearm Plank on Wall  - 1 x daily - 7 x weekly - 1 sets - 3-5 reps - 10 hold  ASSESSMENT:  CLINICAL IMPRESSION: Pt reports 50% improvement in pain since starting PT.  She continues to have fatigue related pain along bil neck Rt>Lt but posterior neck pain and pain along intrascapular and shoulder region has been eliminated since last session.  Today's session focused on progression of stabilization with overlay of  functional lifting, pressing, and pushing.  Pt demo'd improved control with supine neck sit ups today.  She needs ongoing cueing for overall trunk alignment and core strength  focus to complement neck posture and control of dynamic functional strength training.  OBJECTIVE IMPAIRMENTS: decreased mobility, decreased ROM, decreased strength, hypomobility, increased fascial restrictions, increased muscle spasms, impaired flexibility, impaired tone, improper body mechanics, postural dysfunction, and pain.   ACTIVITY LIMITATIONS: carrying, lifting, reach over head, and hygiene/grooming  PARTICIPATION LIMITATIONS: cleaning, laundry, driving, shopping, community activity, and occupation  PERSONAL FACTORS: Time since onset of injury/illness/exacerbation and chronic LBP  are also affecting patient's functional outcome.   REHAB POTENTIAL: Excellent  CLINICAL DECISION MAKING: Stable/uncomplicated  EVALUATION COMPLEXITY: Low   GOALS: Goals reviewed with patient? Yes  SHORT TERM GOALS: Target date: 03/05/24  Pt will be ind with initial HEP Baseline:  Goal status: MET 3/14  2.  Pt will be compliant with more neutral positioning for crocheting to avoid closing mechanics and pain at Rt C5/6 facet. Baseline:  Goal status: MET 3/7  3.  Pt will report reduced neck pain by at least 50% with work and other daily tasks. Baseline:  Goal status: met 3/28    LONG TERM GOALS: Target date: 04/02/24  Pt will be ind with advanced HEP to maintain ROM, mobility and strength for postural support with daily tasks. Baseline:  Goal status: ongoing  2.  Pt will achieve 5/5 strength and improved endurance of anterior neck flexors to support neutral posture with daily demands. Baseline:  Goal status: ongoing  3.  Pt will improve NDI score to 10% or less to demo improved function Baseline: 20% (10/50) Goal status: INITIAL  4.   Pt will report reduced pain with work and crocheting by at least 75% Baseline:  Goal status: ongoing, 50% on 3/28  5.  Pt will improve bil shoulders and upper quadrants to 5/5 to allow for lifting and reaching tasks at home such as cleaning. Baseline:   Goal status: ongoing     PLAN:  PT FREQUENCY: 1-2x/week  PT DURATION: 8 weeks  PLANNED INTERVENTIONS: 97110-Therapeutic exercises, 97530- Therapeutic activity, [id number]- Neuromuscular re-education, 97535- Self Care, 16109- Manual therapy, [id number]- Electrical stimulation (unattended), [phone]- Electrical stimulation (manual), [id number]- Traction (mechanical), [id number]- Ionotophoresis 4mg /ml Dexamethasone, Taping, Dry Needling, Joint mobilization, Spinal manipulation, Spinal mobilization, Cryotherapy, and Moist heat  PLAN FOR NEXT SESSION: core and neck stabilization with overlay of functional strength, DN cervical spine as needed, review and progress HEP, work on neck stabilization strength with neuro re-ed "neck sit ups"   Antonieta Slaven, PT 03/05/24 11:58 AM

## 2024-03-12 ENCOUNTER — Ambulatory Visit: Payer: 59 | Admitting: Obstetrics and Gynecology

## 2024-03-12 ENCOUNTER — Encounter: Payer: Self-pay | Admitting: Obstetrics and Gynecology

## 2024-03-12 ENCOUNTER — Ambulatory Visit: Payer: 59 | Admitting: Physical Therapy

## 2024-03-19 ENCOUNTER — Ambulatory Visit: Payer: 59 | Attending: Family Medicine | Admitting: Physical Therapy

## 2024-03-19 ENCOUNTER — Encounter: Payer: Self-pay | Admitting: Physical Therapy

## 2024-03-19 DIAGNOSIS — R293 Abnormal posture: Secondary | ICD-10-CM | POA: Diagnosis not present

## 2024-03-19 DIAGNOSIS — R252 Cramp and spasm: Secondary | ICD-10-CM | POA: Insufficient documentation

## 2024-03-19 DIAGNOSIS — M542 Cervicalgia: Secondary | ICD-10-CM | POA: Insufficient documentation

## 2024-03-19 DIAGNOSIS — M6281 Muscle weakness (generalized): Secondary | ICD-10-CM | POA: Insufficient documentation

## 2024-03-19 NOTE — Therapy (Signed)
 OUTPATIENT PHYSICAL THERAPY CERVICAL TREATMEM[facility]   Patient Name: Amanda James MRN: [id number] DOB:04-23-1971, 53 y.o., female Today's Date: 03/19/2024  END OF SESSION:  PT End of Session - 03/19/24 0933     Visit Number 6    Date for PT Re-Evaluation 04/02/24    Authorization Type [place] [employer] Save    PT Start Time 0930    PT Stop Time 1017    PT Time Calculation (min) 47 min    Activity Tolerance Patient tolerated treatment well    Behavior During Therapy [facility] for tasks assessed/performed                  Past Medical History:  Diagnosis Date   Allergy    Arthritis    "back"   Cervical cancer screening 05/02/2017   Depression    Depression with anxiety    Diabetes mellitus type 2 in obese    Diabetes mellitus without complication (HCC)    GERD (gastroesophageal reflux disease)    History of chicken pox    Hyperlipidemia    Low back pain    Migraines    Obesity 11/10/2016   PFO (patent foramen ovale)    Past Surgical History:  Procedure Laterality Date   ABDOMINOPLASTY  2012   AUGMENTATION MAMMAPLASTY  2014   maxilift with implants    INCONTINENCE SURGERY  2016   LAPAROSCOPIC GASTRIC BANDING  2005   esophageal dilation. band still present but released   MASTOPEXY  2012   Patient Active Problem List   Diagnosis Date Noted   Pitting of nails 12/30/2022   Left hip pain 12/30/2022   Encounter for counseling 10/25/2022   Urinary incontinence 08/07/2022   Pneumonia due to infectious organism 08/07/2022   Perimenopausal disorder 08/07/2022   Atrophic vaginitis 08/07/2022   PFO (patent foramen ovale) 02/11/2021   RLS (restless legs syndrome) 02/11/2021   CAD (coronary artery disease) 02/11/2021   Rosacea 10/13/2020   Class 1 obesity due to excess calories with serious comorbidity and body mass index (BMI) of 34.0 to 34.9 in adult 09/11/2020   Loud snoring 09/11/2020   Sleep-related headache 09/11/2020   Recurrent UTI 12/26/2019   Shoulder pain,  left 12/26/2019   Low back pain 06/28/2018   Cervical cancer screening 05/02/2017   Chronic migraine w/o aura w/o status migrainosus, not intractable 04/06/2017   Obesity 11/10/2016   Vitamin D deficiency 11/10/2016   Preventative health care 11/10/2016   History of chicken pox    Allergy    Anxiety and depression    Type 2 diabetes mellitus with obesity (HCC)    Hyperlipidemia     PCP: Danise Edge, MD  REFERRING PROVIDER: Rodolph Bong, MD  REFERRING DIAG: [phone] (ICD-10-CM) - Chronic neck pain  THERAPY DIAG:  Cervicalgia  Abnormal posture  Cramp and spasm  Muscle weakness (generalized)  Rationale for Evaluation and Treatment: Rehabilitation  ONSET DATE: chronic  SUBJECTIVE:  SUBJECTIVE STATEMENT: 75% improvement.  I can tell though that I get more pain when I get away from the exercises.   Hand dominance: Right  PERTINENT HISTORY:  LBP - has had PT at this clinic and gets facet ablations Controlled migraines  PAIN:  PAIN:  Are you having pain? Yes NPRS scale: 3/10 Pain location: lateral neck bil Pain orientation: Right  PAIN TYPE: aching, dull, and tight Pain description: dull, aching, and stiff  constant low level, intermittent spikes of worse pain Aggravating factors: crocheting - I sit mermaid style and lean my head to the Rt and it gets stiff and sore, I can wake up with it hurting too Relieving factors: stretching, ibuprofen   PRECAUTIONS: None  RED FLAGS: None     WEIGHT BEARING RESTRICTIONS: No  FALLS:  Has patient fallen in last 6 months? 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  DIAGNOSTIC FINDINGS:  Cervical Xray: Report not available yet but MD note states DDD C5/6  PATIENT SURVEYS:  NDI 10/50, 20%  COGNITION: Overall cognitive status: Within functional limits for tasks assessed  SENSATION: WFL  POSTURE: forward head and hinge at C5/6  PALPATION: Tender at Right C5/6 facet and deep multifidus Tight band present in Rt>Lt upper trap   CERVICAL ROM:   Active ROM A/PROM (deg) eval  Flexion 65, stretch  Extension 60  Right lateral flexion 35, Rt p!  Left lateral flexion 30, Rt p!  Right rotation 70 p!  Left rotation 70   (Blank rows = not tested)  UPPER EXTREMITY ROM: Full without pain   UPPER EXTREMITY MMT: 5/5 with exception of Rt ER 4+/5, anterior neck flexors 4/5, intrascapular stabilizers 4/5  CERVICAL SPECIAL TESTS:  Spurling's test: Positive in fully closed position on Rt    TREATMENT DATE:  03/19/24: UBE L4 2x2 PT present to discuss status Seated neck retraction into towel 5x5" holds Seated neck flexion stretch with towel overpressure x20", Rt upper trap and levator stretch with OP x20" each Standing on flat side of BOSU x30" for standing core activation Standing on foam pad 3 way raises 3lb bil x8 rounds Sit to stand with OH press and chest press x5 each Low seated row 40lb x8 Wall plank elbows on red physioball move ball up/down/left/right x10 each way Wall plank extended arms opp shoulder taps x 20 Yellow loop scapular 3-way reaches against wall  bil x 5 rounds Lower trap lift offs from wall x10   03/05/24: UBE L4 2.5x2.5 fwd/bwd PT present to discuss status Seated neck retraction into towel 5x5" holds - 2 rounds Seated SB isometrics 5x5" - VC to use head into hand vs hand into head Standing 3-way 2lb bil UE raise to 90 deg x8 rounds  Seated reverse sit up with 5lb kbell press x10 Sit to  stand with 5lb OH press - VC for hip hinge and neutral neck posture SL open book x8 each side with knees flexed to protect lumbar spine Quadruped neck retraction with alt UE reach x10 Supine neck on soft foam roller feet in 90/90 on green ball - OH bil 3lb flexion x10 Neck sit ups 5x straight and 3x each with rotation Y lift off  lower trap setting from wall x10 Yellow tied loop 3-way scapular reaches on wall x5 cycles each   02/27/24: UBE L4 2.5x2.5 fwd/bwd PT present to discuss status Supine neck retraction and retraction with bil rotation x 5 each on soft foam roller as pillow Neck sit ups and sit ups with rotation x5 each - needed TC with Rt rotation for control Standing green band to fatigue: horiz abd and lower trap field goal arm rows Bent over 1lb shoulder extension, abd and Y to fatigue each, bil Neck flexion stretch with OP 2x20" Wall plank on elbows with SA press 2x20" Y wall lift offs for lower trap setting 2x5 Trigger Point Dry Needling Subsequent Treatment: Instructions provided previously at initial dry needling treatment.  Patient Verbal Consent Given: Yes Education Handout Provided: Previously Provided Muscles Treated: right upper trap at cervical intersection ant/post, deep multifidi C5-C7 bil Electrical Stimulation Performed: No Treatment Response/Outcome: signif reduction in tension and bunching of muscle especially Rt upper trap   PATIENT EDUCATION:  Education details: Person educated: Patient Education method: Explanation, Demonstration, and Handouts Education comprehension: verbalized understanding and returned demonstration  HOME EXERCISE PROGRAM: Access Code: URL: [email or web address] Date: 02/20/2024 Prepared by: Loistine Simas Beyounce Dickens  Exercises - Supine Cervical Retraction with Towel  - 1 x daily - 7 x weekly - 1 sets - 10 reps - 10 hold - Seated Isometric Cervical Rotation  - 1 x daily - 7 x weekly - 1 sets - 10 reps - 5  hold - Seated Isometric Cervical Sidebending  - 1 x daily - 7 x weekly - 1 sets - 10 reps - 5 hold - Supine Shoulder Horizontal Abduction with Resistance  - 1 x daily - 7 x weekly - 3 sets - 10 reps - Supine Shoulder External Rotation with Resistance  - 1 x daily - 7 x weekly - 3 sets - 10 reps - Cervical Retraction with Resistance  - 1 x daily - 7 x weekly - 1 sets - 10 reps - 5 hold - Supine Shoulder External Rotation with Resistance  - 1 x daily - 7 x weekly - 2 sets - 10 reps - Standing Row with Anchored Resistance  - 1 x daily - 7 x weekly - 2 sets - 10 reps - Reverse Fly with Anchored Resistance  - 1 x daily - 7 x weekly - 2 sets - 10 reps - Low Trap Setting at Wall  - 1 x daily - 7 x weekly - 2 sets - 10 reps - Forearm Plank on Wall  - 1 x daily - 7 x weekly - 1 sets - 3-5 reps - 10 hold  ASSESSMENT:  CLINICAL IMPRESSION: Pt reports 75% improvement in pain since starting PT.  She notes she is able to maintain improved posture and perform patient care longer before onset of neck pain.  Her trigger points are getting much more localized at C5/6 multifidi and Rt upper trap with smaller size and number.  She is demo'ing much improved trunk control with overlay of push, pull, and lift with load and resistance.  She will continue to benefit from skilled progression along POC.  Re-eval next time.  OBJECTIVE IMPAIRMENTS: decreased mobility, decreased ROM, decreased strength, hypomobility, increased fascial restrictions, increased muscle spasms, impaired flexibility, impaired tone, improper body mechanics, postural dysfunction, and pain.   ACTIVITY LIMITATIONS: carrying, lifting, reach over head, and hygiene/grooming  PARTICIPATION LIMITATIONS: cleaning, laundry, driving, shopping, community activity, and occupation  PERSONAL FACTORS: Time since onset  of injury/illness/exacerbation and chronic LBP  are also affecting patient's functional outcome.   REHAB POTENTIAL: Excellent  CLINICAL  DECISION MAKING: Stable/uncomplicated  EVALUATION COMPLEXITY: Low   GOALS: Goals reviewed with patient? Yes  SHORT TERM GOALS: Target date: 03/05/24  Pt will be ind with initial HEP Baseline:  Goal status: MET 3/14  2.  Pt will be compliant with more neutral positioning for crocheting to avoid closing mechanics and pain at Rt C5/6 facet. Baseline:  Goal status: MET 3/7  3.  Pt will report reduced neck pain by at least 50% with work and other daily tasks. Baseline:  Goal status: met 3/28    LONG TERM GOALS: Target date: 04/02/24  Pt will be ind with advanced HEP to maintain ROM, mobility and strength for postural support with daily tasks. Baseline:  Goal status: ongoing  2.  Pt will achieve 5/5 strength and improved endurance of anterior neck flexors to support neutral posture with daily demands. Baseline:  Goal status: ongoing  3.  Pt will improve NDI score to 10% or less to demo improved function Baseline: 20% (10/50) Goal status: INITIAL  4.   Pt will report reduced pain with work and crocheting by at least 75% Baseline:  Goal status: ongoing, 50% on 3/28  5.  Pt will improve bil shoulders and upper quadrants to 5/5 to allow for lifting and reaching tasks at home such as cleaning. Baseline:  Goal status: ongoing     PLAN:  PT FREQUENCY: 1-2x/week  PT DURATION: 8 weeks  PLANNED INTERVENTIONS: 97110-Therapeutic exercises, 97530- Therapeutic activity, [id number]- Neuromuscular re-education, 97535- Self Care, 16109- Manual therapy, G0283- Electrical stimulation (unattended), [id number]- Electrical stimulation (manual), [id number]- Traction (mechanical), 97033- Ionotophoresis 4mg /ml Dexamethasone, Taping, Dry Needling, Joint mobilization, Spinal manipulation, Spinal mobilization, Cryotherapy, and Moist heat  PLAN FOR NEXT SESSION: re-eval, core and neck stabilization with overlay of functional strength, DN cervical spine as needed, review and progress HEP, work on neck  stabilization strength with neuro re-ed "neck sit ups"   Ritchard Paragas, PT 03/19/24 10:21 AM

## 2024-04-02 ENCOUNTER — Encounter: Payer: Self-pay | Admitting: Physical Therapy

## 2024-04-02 ENCOUNTER — Ambulatory Visit: Payer: 59 | Admitting: Physical Therapy

## 2024-04-02 DIAGNOSIS — M542 Cervicalgia: Secondary | ICD-10-CM

## 2024-04-02 DIAGNOSIS — M6281 Muscle weakness (generalized): Secondary | ICD-10-CM

## 2024-04-02 DIAGNOSIS — R252 Cramp and spasm: Secondary | ICD-10-CM | POA: Diagnosis not present

## 2024-04-02 DIAGNOSIS — R293 Abnormal posture: Secondary | ICD-10-CM | POA: Diagnosis not present

## 2024-04-02 NOTE — Therapy (Signed)
 OUTPATIENT PHYSICAL THERAPY CERVICAL DISCHARGE NOTE   Patient Name: Amanda James MRN: [id number] DOB:[birth year], 53 y.o., female Today's Date: 04/02/2024  END OF SESSION:  PT End of Session - 04/02/24 1013     Visit Number 7    Date for PT Re-Evaluation 04/02/24    Authorization Type [place] [employer]    PT Start Time [phone]    PT Stop Time 1006    PT Time Calculation (min) 34 min    Activity Tolerance Patient tolerated treatment well    Behavior During Therapy [facility] for tasks assessed/performed                   Past Medical History:  Diagnosis Date   Allergy    Arthritis    "back"   Cervical cancer screening 05/02/2017   Depression    Depression with anxiety    Diabetes mellitus type 2 in obese    Diabetes mellitus without complication (HCC)    GERD (gastroesophageal reflux disease)    History of chicken pox    Hyperlipidemia    Low back pain    Migraines    Obesity 11/10/2016   PFO (patent foramen ovale)    Past Surgical History:  Procedure Laterality Date   ABDOMINOPLASTY  2012   AUGMENTATION MAMMAPLASTY  2014   maxilift with implants    INCONTINENCE SURGERY  2016   LAPAROSCOPIC GASTRIC BANDING  2005   esophageal dilation. band still present but released   MASTOPEXY  2012   Patient Active Problem List   Diagnosis Date Noted   Pitting of nails 12/30/2022   Left hip pain 12/30/2022   Encounter for counseling 10/25/2022   Urinary incontinence 08/07/2022   Pneumonia due to infectious organism 08/07/2022   Perimenopausal disorder 08/07/2022   Atrophic vaginitis 08/07/2022   PFO (patent foramen ovale) 02/11/2021   RLS (restless legs syndrome) 02/11/2021   CAD (coronary artery disease) 02/11/2021   Rosacea 10/13/2020   Class 1 obesity due to excess calories with serious comorbidity and body mass index (BMI) of 34.0 to 34.9 in adult 09/11/2020   Loud snoring 09/11/2020   Sleep-related headache 09/11/2020   Recurrent UTI 12/26/2019   Shoulder  pain, left 12/26/2019   Low back pain 06/28/2018   Cervical cancer screening 05/02/2017   Chronic migraine w/o aura w/o status migrainosus, not intractable 04/06/2017   Obesity 11/10/2016   Vitamin D  deficiency 11/10/2016   Preventative health care 11/10/2016   History of chicken pox    Allergy    Anxiety and depression    Type 2 diabetes mellitus with obesity (HCC)    Hyperlipidemia     PCP: Randie Bustle, MD  REFERRING PROVIDER: Syliva Even, MD  REFERRING DIAG: [id number] (ICD-10-CM) - Chronic neck pain  THERAPY DIAG:  Cervicalgia  Abnormal posture  Cramp and spasm  Muscle weakness (generalized)  Rationale for Evaluation and Treatment: Rehabilitation  ONSET DATE: chronic  SUBJECTIVE:  SUBJECTIVE STATEMENT: I am 80-90% better.  I am able to work much longer and if I have any pain I can correct it pretty quickly.  I did have my back go out on me last weekend after being bent forward too long but it's already 90% better.    Hand dominance: Right  PERTINENT HISTORY:  LBP - has had PT at this clinic and gets facet ablations Controlled migraines  PAIN:  PAIN:  Are you having pain? Yes NPRS scale: 0/10 Pain location: lateral neck bil Pain orientation: Right  PAIN TYPE: aching, dull Pain description: very rare, dull achy on Rt Aggravating factors: sitting wrong but then I correct myself Relieving factors: stretching, ibuprofen    PRECAUTIONS: None  RED FLAGS: None     WEIGHT BEARING RESTRICTIONS: No  FALLS:  Has patient fallen in last 6 months? No  LIVING ENVIRONMENT: Lives with: lives with their spouse    OCCUPATION: full time MD with [employer]  PLOF: Independent  PATIENT GOALS: learn what to do to get ahead of it  worsening  NEXT MD VISIT: after PT  OBJECTIVE:  Note: Objective measures were completed at Evaluation unless otherwise noted.  DIAGNOSTIC FINDINGS:  Cervical Xray: Report not available yet but MD note states DDD C5/6  PATIENT SURVEYS:  04/02/24: NDI 2/50, 4% Eval: NDI 10/50, 20%  COGNITION: Overall cognitive status: Within functional limits for tasks assessed  SENSATION: WFL  POSTURE: 04/02/24: much improved postural awareness and control in both static and dynamic postures and activities Eval: forward head and hinge at C5/6  PALPATION: 04/02/24: Mild tightness and tenderness Rt upper trap  Eval: Tender at Right C5/6 facet and deep multifidus Tight band present in Rt>Lt upper trap   CERVICAL ROM:   Active ROM A/PROM (deg) eval A/PROM 04/02/24  Flexion 65, stretch 80, stretch  Extension 60 60  Right lateral flexion 35, Rt p! 55  Left lateral flexion 30, Rt p! 45, Rt stretch  Right rotation 70 p! 85, end range stiffness  Left rotation 70 85   (Blank rows = not tested)  UPPER EXTREMITY ROM: Full without pain   UPPER EXTREMITY MMT: 04/02/24: 5/5 neck, shoulders and intrascapular stabilizers  Eval: 5/5 with exception of Rt ER 4+/5, anterior neck flexors 4/5, intrascapular stabilizers 4/5  CERVICAL SPECIAL TESTS:  04/02/24: Negative Spurlings  Eval: Spurling's test: Positive in fully closed position on Rt    TREATMENT DATE:  04/02/24: Objective measures, history of current presentation, NDI Review of HEP, review of stretches - cues for making sure Pt anchors with stretches given she has a lot of ROM so needs to actively anchor neck with retraction during stretches to target stretch Neck stabilization "sit ups" - manual resistance then A/ROM - good control Written instructions for proper Rt upper trap stretch given  03/19/24: UBE L4 2x2 PT present to discuss status Seated neck retraction into towel 5x5" holds Seated neck flexion stretch with towel  overpressure x20", Rt upper trap and levator stretch with OP x20" each Standing on flat side of BOSU x30" for standing core activation Standing on foam pad 3 way raises 3lb bil x8 rounds Sit to stand with OH press and chest press x5 each Low seated row 40lb x8 Wall plank elbows on red physioball move ball up/down/left/right x10 each way Wall plank extended arms opp shoulder taps x 20 Yellow loop scapular 3-way reaches against wall  bil x 5 rounds Lower trap lift offs from wall x10  03/05/24: UBE L4 2.5x2.5 fwd/bwd PT present to discuss status Seated neck retraction into towel 5x5" holds - 2 rounds Seated SB isometrics 5x5" - VC to use head into hand vs hand into head Standing 3-way 2lb bil UE raise to 90 deg x8 rounds  Seated reverse sit up with 5lb kbell press x10 Sit to stand with 5lb OH press - VC for hip hinge and neutral neck posture SL open book x8 each side with knees flexed to protect lumbar spine Quadruped neck retraction with alt UE reach x10 Supine neck on soft foam roller feet in 90/90 on green ball - OH bil 3lb flexion x10 Neck sit ups 5x straight and 3x each with rotation Y lift off lower trap setting from wall x10 Yellow tied loop 3-way scapular reaches on wall x5 cycles each   PATIENT EDUCATION:  Education details: Person educated: Patient Education method: Explanation, Demonstration, and Handouts Education comprehension: verbalized understanding and returned demonstration  HOME EXERCISE PROGRAM: Access Code: URL: [email or web address] Date: 02/20/2024 Prepared by: Minor Amble Jeannelle Wiens  Exercises - Supine Cervical Retraction with Towel  - 1 x daily - 7 x weekly - 1 sets - 10 reps - 10 hold - Seated Isometric Cervical Rotation  - 1 x daily - 7 x weekly - 1 sets - 10 reps - 5 hold - Seated Isometric Cervical Sidebending  - 1 x daily - 7 x weekly - 1 sets - 10 reps - 5 hold - Supine Shoulder Horizontal Abduction with Resistance  - 1 x  daily - 7 x weekly - 3 sets - 10 reps - Supine Shoulder External Rotation with Resistance  - 1 x daily - 7 x weekly - 3 sets - 10 reps - Cervical Retraction with Resistance  - 1 x daily - 7 x weekly - 1 sets - 10 reps - 5 hold - Supine Shoulder External Rotation with Resistance  - 1 x daily - 7 x weekly - 2 sets - 10 reps - Standing Row with Anchored Resistance  - 1 x daily - 7 x weekly - 2 sets - 10 reps - Reverse Fly with Anchored Resistance  - 1 x daily - 7 x weekly - 2 sets - 10 reps - Low Trap Setting at Wall  - 1 x daily - 7 x weekly - 2 sets - 10 reps - Forearm Plank on Wall  - 1 x daily - 7 x weekly - 1 sets - 3-5 reps - 10 hold  ASSESSMENT:  CLINICAL IMPRESSION: Pt reports 80-90% improvement in pain since starting PT.  She has full ROM with end range stretching on Rt side with flexion, Rt Rot and Lt SB.  NDI is now 4% from 20%.  She is able to work with significantly less pain occurrence and has HEP and postural education tools to use to reduce and eliminate pain.  She has met all goals and is ready to d/c at this time.    OBJECTIVE IMPAIRMENTS: decreased mobility, decreased ROM, decreased strength, hypomobility, increased fascial restrictions, increased muscle spasms, impaired flexibility, impaired tone, improper body mechanics, postural dysfunction, and pain.   ACTIVITY LIMITATIONS: carrying, lifting, reach over head, and hygiene/grooming  PARTICIPATION LIMITATIONS: cleaning, laundry, driving, shopping, community activity, and occupation  PERSONAL FACTORS: Time since onset of injury/illness/exacerbation and chronic LBP  are also affecting patient's functional outcome.   REHAB POTENTIAL: Excellent  CLINICAL DECISION MAKING: Stable/uncomplicated  EVALUATION COMPLEXITY: Low   GOALS: Goals reviewed with patient? Yes  SHORT  TERM GOALS: Target date: 03/05/24  Pt will be ind with initial HEP Baseline:  Goal status: MET 3/14  2.  Pt will be compliant with more neutral  positioning for crocheting to avoid closing mechanics and pain at Rt C5/6 facet. Baseline:  Goal status: MET 3/7  3.  Pt will report reduced neck pain by at least 50% with work and other daily tasks. Baseline:  Goal status: met 3/28    LONG TERM GOALS: Target date: 04/02/24  Pt will be ind with advanced HEP to maintain ROM, mobility and strength for postural support with daily tasks. Baseline:  Goal status: met 4/25  2.  Pt will achieve 5/5 strength and improved endurance of anterior neck flexors to support neutral posture with daily demands. Baseline:  Goal status: ongoing  3.  Pt will improve NDI score to 10% or less to demo improved function Baseline: 20% (10/50) Goal status: 4%, met 4/25  4.   Pt will report reduced pain with work and crocheting by at least 75% Baseline:  Goal status: ongoing, 50% on 3/28.  90% 4/25, MET  5.  Pt will improve bil shoulders and upper quadrants to 5/5 to allow for lifting and reaching tasks at home such as cleaning. Baseline:  Goal status: MET, 4/25     PLAN:  PT FREQUENCY: 1-2x/week  PT DURATION: 6 weeks  PLANNED INTERVENTIONS: 97110-Therapeutic exercises, 97530- Therapeutic activity, 97112- Neuromuscular re-education, 97535- Self Care, 40981- Manual therapy, G0283- Electrical stimulation (unattended), [phone]- Electrical stimulation (manual), [id number]- Traction (mechanical), [id number]- Ionotophoresis 4mg /ml Dexamethasone , Taping, Dry Needling, Joint mobilization, Spinal manipulation, Spinal mobilization, Cryotherapy, and Moist heat  PLAN FOR NEXT SESSION: d/c to HEP     PHYSICAL THERAPY DISCHARGE SUMMARY  Visits from Start of Care: 7  Current functional level related to goals / functional outcomes: See above   Remaining deficits: See above   Education / Equipment: HEP   Patient agrees to discharge. Patient goals were met. Patient is being discharged due to meeting the stated rehab goals.  Nicklos Gaxiola, PT 04/02/24 10:14  AM

## 2024-04-07 ENCOUNTER — Other Ambulatory Visit: Payer: Self-pay | Admitting: Family Medicine

## 2024-04-07 ENCOUNTER — Other Ambulatory Visit (HOSPITAL_COMMUNITY): Payer: Self-pay

## 2024-04-07 ENCOUNTER — Other Ambulatory Visit: Payer: Self-pay

## 2024-04-07 MED ORDER — ATORVASTATIN CALCIUM 20 MG PO TABS
20.0000 mg | ORAL_TABLET | Freq: Every day | ORAL | 1 refills | Status: DC
  Filled 2024-04-07: qty 90, 90d supply, fill #0
  Filled 2024-07-01: qty 90, 90d supply, fill #1

## 2024-04-07 MED ORDER — ESCITALOPRAM OXALATE 20 MG PO TABS
20.0000 mg | ORAL_TABLET | Freq: Every morning | ORAL | 1 refills | Status: DC
  Filled 2024-04-07: qty 90, 90d supply, fill #0
  Filled 2024-07-01: qty 90, 90d supply, fill #1

## 2024-04-08 ENCOUNTER — Other Ambulatory Visit (HOSPITAL_COMMUNITY): Payer: Self-pay

## 2024-04-08 ENCOUNTER — Other Ambulatory Visit: Payer: Self-pay

## 2024-04-09 ENCOUNTER — Other Ambulatory Visit: Payer: Self-pay

## 2024-04-09 ENCOUNTER — Encounter: Admitting: Physical Therapy

## 2024-04-16 ENCOUNTER — Encounter: Admitting: Physical Therapy

## 2024-04-23 ENCOUNTER — Encounter: Admitting: Physical Therapy

## 2024-04-29 ENCOUNTER — Encounter: Payer: Self-pay | Admitting: Family Medicine

## 2024-04-29 ENCOUNTER — Other Ambulatory Visit: Payer: Self-pay | Admitting: Family Medicine

## 2024-04-29 DIAGNOSIS — G8929 Other chronic pain: Secondary | ICD-10-CM

## 2024-04-30 ENCOUNTER — Encounter

## 2024-04-30 ENCOUNTER — Other Ambulatory Visit (HOSPITAL_COMMUNITY): Payer: Self-pay

## 2024-04-30 ENCOUNTER — Ambulatory Visit (INDEPENDENT_AMBULATORY_CARE_PROVIDER_SITE_OTHER): Payer: Self-pay | Admitting: Surgical

## 2024-04-30 ENCOUNTER — Other Ambulatory Visit: Payer: Self-pay

## 2024-04-30 DIAGNOSIS — Z719 Counseling, unspecified: Secondary | ICD-10-CM

## 2024-04-30 MED ORDER — BUPROPION HCL ER (SR) 100 MG PO TB12
100.0000 mg | ORAL_TABLET | Freq: Two times a day (BID) | ORAL | 0 refills | Status: DC
  Filled 2024-04-30: qty 180, 90d supply, fill #0

## 2024-04-30 NOTE — Progress Notes (Signed)
 Botulinum Toxin and Filler Injection Procedure Note  Procedure: Filler administration  Pre-operative Diagnosis: Midface volume loss  Post-operative Diagnosis: Same  Complications:  None  Brief history: The patient desires filler injection of her nasolabial folds.  I discussed with the patient this proposed procedure of filler injections.  We discussed filler injections or customize depending on the particular needs of the patient, it is performed on various locations of the face for temporary correction.  We discussed that fillers on average last approximately 12 months.  We discussed this can vary from patient the patient.  We discussed the risks including bleeding, scarring, formation of a granuloma, infection, asymmetry, vascular occlusion resulting in skin necrosis, vascular occlusion resulting in blindness.   Procedure: The area was prepped with alcohol and dried with a clean gauze.  The cheeks were marked using a white makeup pen from lateral canthus to corner of mouth and tragus of ear to nasal ala. The cheeks were injected in 3 separate locations on each side, progressing from posterior to anterior. Each injection was placed in the supraperiosteal plane. Aspiration was performed with each injection to ensure needle was not placed within a vessel.  Patient tolerated this well there was no complications.  Patient was instructed in postoperative care.  All of her questions were answered to her content.  A total of 1 cc was injected, 0.5 cc in the left and 0.5 cc on the right.  At the end of the procedure, all of the skin had good color and capillary refill.   Restylane Contour - Exp 08/08/2025 Lot [id number]

## 2024-05-01 ENCOUNTER — Other Ambulatory Visit (HOSPITAL_COMMUNITY): Payer: Self-pay

## 2024-05-01 ENCOUNTER — Other Ambulatory Visit (HOSPITAL_BASED_OUTPATIENT_CLINIC_OR_DEPARTMENT_OTHER): Payer: Self-pay

## 2024-05-01 MED ORDER — CYCLOBENZAPRINE HCL 10 MG PO TABS
10.0000 mg | ORAL_TABLET | Freq: Three times a day (TID) | ORAL | 3 refills | Status: DC | PRN
  Filled 2024-05-01: qty 30, 10d supply, fill #0
  Filled 2024-05-26: qty 30, 10d supply, fill #1
  Filled 2024-06-04: qty 30, 10d supply, fill #2
  Filled 2024-09-22: qty 30, 10d supply, fill #3

## 2024-05-04 ENCOUNTER — Telehealth: Payer: Self-pay | Admitting: Pharmacist

## 2024-05-04 NOTE — Telephone Encounter (Signed)
 Pharmacy Patient Advocate Encounter  Received notification from [facility] that Prior Authorization for Ubrelvy  100MG  tablets has been APPROVED from 05/04/2024 to 05/04/2025   PA #/Case ID/Reference #: [id number]

## 2024-05-04 NOTE — Telephone Encounter (Signed)
 Pharmacy Patient Advocate Encounter   Received notification from CoverMyMeds that prior authorization for Ubrelvy  100MG  tablets is required/requested.   Insurance verification completed.   The patient is insured through [facility] .   Per test claim: PA required; PA submitted to above mentioned insurance via CoverMyMeds Key/confirmation #/EOC [id number] Status is pending

## 2024-05-07 ENCOUNTER — Encounter: Admitting: Physical Therapy

## 2024-05-07 ENCOUNTER — Ambulatory Visit: Admitting: Obstetrics and Gynecology

## 2024-05-07 ENCOUNTER — Ambulatory Visit (INDEPENDENT_AMBULATORY_CARE_PROVIDER_SITE_OTHER): Admitting: Family Medicine

## 2024-05-07 ENCOUNTER — Other Ambulatory Visit (HOSPITAL_COMMUNITY): Payer: Self-pay

## 2024-05-07 ENCOUNTER — Encounter: Payer: Self-pay | Admitting: Obstetrics and Gynecology

## 2024-05-07 ENCOUNTER — Other Ambulatory Visit: Payer: Self-pay

## 2024-05-07 VITALS — BP 118/60 | HR 79 | Ht 66.0 in | Wt 204.2 lb

## 2024-05-07 VITALS — BP 90/67 | HR 79

## 2024-05-07 DIAGNOSIS — G8929 Other chronic pain: Secondary | ICD-10-CM

## 2024-05-07 DIAGNOSIS — N3281 Overactive bladder: Secondary | ICD-10-CM

## 2024-05-07 DIAGNOSIS — N393 Stress incontinence (female) (male): Secondary | ICD-10-CM

## 2024-05-07 DIAGNOSIS — M47816 Spondylosis without myelopathy or radiculopathy, lumbar region: Secondary | ICD-10-CM

## 2024-05-07 DIAGNOSIS — N950 Postmenopausal bleeding: Secondary | ICD-10-CM

## 2024-05-07 DIAGNOSIS — M545 Low back pain, unspecified: Secondary | ICD-10-CM

## 2024-05-07 MED ORDER — MIRABEGRON ER 25 MG PO TB24
25.0000 mg | ORAL_TABLET | Freq: Every day | ORAL | 11 refills | Status: DC
  Filled 2024-05-07: qty 30, 30d supply, fill #0
  Filled 2024-06-04: qty 30, 30d supply, fill #1

## 2024-05-07 NOTE — Progress Notes (Signed)
 [facility] Return Visit  SUBJECTIVE  History of Present Illness: Amanda James is a 53 y.o. female seen in follow-up for mixed incontinence.  Was previously on the vesicare  5mg  but had side effect of dry mouth. Tried to take it every other day but still had severe dry mouth so stopped. Currently, voids 6-8 times per day but has urgency. Usually wakes 1-2 times per night. Only has SUI when she is sick, or allergy season. She does not feel it is that bothersome for her.   Drinks about 100oz of water. Otherwise drinks yogurt, occasional diet soda or coffee  She had a "normal period" with bleeding for a few days, about 3 months ago. She has been menopausal for 2 years.   Prior Urodynamic Impression:  1. Sensation was normal; capacity was normal 2. Stress Incontinence was demonstrated at normal pressures; 3. Detrusor Overactivity was demonstrated without leakage. 4. Emptying was dysfunctional with a elevated PVR ( ), a sustained detrusor contraction not present,  abdominal straining present, dyssynergic urethral sphincter activity on EMG.  Past Medical History: Patient  has a past medical history of Allergy, Arthritis, Cervical cancer screening (05/02/2017), Depression, Depression with anxiety, Diabetes mellitus type 2 in obese, Diabetes mellitus without complication (HCC), GERD (gastroesophageal reflux disease), History of chicken pox, Hyperlipidemia, Low back pain, Migraines, Obesity (11/10/2016), and PFO (patent foramen ovale).   Past Surgical History: She  has a past surgical history that includes Incontinence surgery (2016); Abdominoplasty (2012); Mastopexy (2012); Laparoscopic gastric banding (2005); and Augmentation mammaplasty (2014).   Medications: She has a current medication list which includes the following prescription(s): aspirin  ec, atorvastatin , betamethasone  dipropionate, freestyle lite, botulinum toxin type a , bupropion  er, cetirizine hcl, premarin ,  cyclobenzaprine , enalapril , escitalopram , fluticasone propionate, freestyle lite, freestyle, minoxidil, mirabegron er, prevident  5000 sensitive, mounjaro , and ubrelvy , and the following Facility-Administered Medications: gadobenate dimeglumine  and sodium chloride  flush.   Allergies: Patient is allergic to erythromycin.   Social History: Patient  reports that she has never smoked. She has never been exposed to tobacco smoke. She has never used smokeless tobacco. She reports current alcohol use of about 5.0 standard drinks of alcohol per week. She reports that she does not use drugs.      OBJECTIVE     Physical Exam: Vitals:   05/07/24 1516  BP: 90/67  Pulse: 79    Gen: No apparent distress, A&O x 3.  Detailed Urogynecologic Evaluation:  Deferred.    ASSESSMENT AND PLAN    Ms. Coull is a 53 y.o. with:  1. Overactive bladder   2. Post-menopausal bleeding     OAB - Prescribed Myrbetriq 25mg  daily. If this is cost prohibitive, can order Trospium 20mg  BID as an alternative.    2. SUI - will monitor  3. Post menopausal bleeding - Will order TVUS to assess endometrium - She does not currently have a general gynecologist- will refer to [facility] for further workup  Follow up 6-8 weeks  Arma Lamp, MD

## 2024-05-07 NOTE — Progress Notes (Signed)
   I, Leone Ralphs am a scribe for Dr. Garlan Juniper, MD.  Amanda James is a 53 y.o. female who presents to [facility] at [facility] today for worsening LBP. Pt was last seen for her back on 09/08/23 and repeat facet block and ablations were ordered, performed on 10/02/23.  Today, pt reports it is not too bad today. Has had recent spasms in back. Pain does not wake her up at night.  Radiating pain:yes to right buttocks LE numbness/tingling:no LE weakness:no Aggravates: standing Treatments tried:Ibuprofen  and flexeril   Dx imaging: 09/08/23 L-spine XR 07/30/20 L-spine MRI 07/21/20 L-spine XR   Pertinent review of systems: No fevers or chills  Relevant historical information: History of multiple successful facet medial branch block and ablations. Most recent ablation was bilateral L4-5 facets performed October 2024  Exam:  BP 118/60   Pulse 79   Ht 5\' 6"  (1.676 m)   Wt 204 lb 3.2 oz (92.6 kg)   LMP 05/18/2021 (Exact Date)   SpO2 98%   BMI 32.96 kg/m  General: Well Developed, well nourished, and in no acute distress.   MSK: L-spine decreased lumbar motion.      Assessment and Plan: 53 y.o. 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  Most recently she had pretty successful medial branch block and ablations bilateral facets L4-5 at [facility] imaging.  However the provider at that location Dr. Ardelle Beavers who does this procedure has left the practice.  She will need to switch to a new location.  Will refer to Dr. Crecencio Dodge.  Backup plan is the other [facility] doctors in town including Dr. Ibazebo etc. Her MRI is quite old.  Her most recent lumbar spine MRI was in 2021.  Will update lumbar spine MRI for injection planning.   PDMP not reviewed this encounter. Orders Placed This Encounter  Procedures   MR Lumbar Spine Wo Contrast    Standing Status:   Future    Expiration Date:   05/07/2025    What is the patient's sedation requirement?:   No Sedation    Does  the patient have a pacemaker or implanted devices?:   No    Preferred imaging location?:   [facility] (table limit-550lbs)   Ambulatory referral to Pain Clinic    Referral Priority:   Routine    Referral Type:   Consultation    Referral Reason:   Specialty Services Required    Referred to Provider:   Annis Kinder, MD    Requested Specialty:   Pain Medicine    Number of Visits Requested:   1   No orders of the defined types were placed in this encounter.    Discussed warning signs or symptoms. Please see discharge instructions. Patient expresses understanding.   The above documentation has been reviewed and is accurate and complete Garlan Juniper, M.D.

## 2024-05-07 NOTE — Patient Instructions (Addendum)
 Thank you for coming in today.   You should hear from Dr Oral Billings office shortly.   You should hear from MRI scheduling within 1 week. If you do not hear please let me know.

## 2024-05-08 ENCOUNTER — Other Ambulatory Visit (HOSPITAL_COMMUNITY): Payer: Self-pay

## 2024-05-08 ENCOUNTER — Encounter (HOSPITAL_COMMUNITY): Payer: Self-pay

## 2024-05-10 ENCOUNTER — Encounter: Payer: Self-pay | Admitting: Family Medicine

## 2024-05-14 ENCOUNTER — Encounter: Admitting: Physical Therapy

## 2024-05-15 ENCOUNTER — Other Ambulatory Visit

## 2024-05-20 ENCOUNTER — Encounter: Payer: Self-pay | Admitting: Obstetrics and Gynecology

## 2024-05-21 ENCOUNTER — Ambulatory Visit: Payer: Self-pay | Admitting: Surgical

## 2024-05-21 DIAGNOSIS — Z719 Counseling, unspecified: Secondary | ICD-10-CM

## 2024-05-21 NOTE — Progress Notes (Signed)
 Patient is a 53 year old female here for follow-up after filler of bilateral cheeks.  She is very happy with her results.  She is interested in additional filler of her cheeks, nasolabial folds and lips.   She is most interested in improving nasolabial folds at this time. We discussed using Juvderm ultra plus, we discussed that I like this product because it is firm enough to provide support to the lips without compromising movement or abnormal appearance with movement.  It is also a very universal and can be used in both the cheeks and nasolabial folds.  Unfortunately we did not have Juvderm ultra plus in the office today, we will order and have patient follow-up in 2 weeks for injection.  All of her questions were answered to her content.

## 2024-05-22 ENCOUNTER — Ambulatory Visit
Admission: RE | Admit: 2024-05-22 | Discharge: 2024-05-22 | Disposition: A | Discharge status: Home / Self Care | Source: Ambulatory Visit | Attending: Family Medicine | Admitting: Family Medicine

## 2024-05-22 DIAGNOSIS — M545 Low back pain, unspecified: Secondary | ICD-10-CM | POA: Diagnosis not present

## 2024-05-22 DIAGNOSIS — G8929 Other chronic pain: Secondary | ICD-10-CM

## 2024-05-22 DIAGNOSIS — M47816 Spondylosis without myelopathy or radiculopathy, lumbar region: Secondary | ICD-10-CM

## 2024-05-24 ENCOUNTER — Encounter

## 2024-05-27 ENCOUNTER — Other Ambulatory Visit (HOSPITAL_COMMUNITY): Payer: Self-pay

## 2024-05-27 ENCOUNTER — Other Ambulatory Visit: Payer: Self-pay

## 2024-06-01 ENCOUNTER — Ambulatory Visit: Payer: Self-pay | Admitting: Family Medicine

## 2024-06-01 NOTE — Progress Notes (Signed)
 Lumbar spine MRI shows possible pinched nerve.  You do have arthritis as well.  The facet joints look a little worse to me than they did previously.

## 2024-06-04 ENCOUNTER — Ambulatory Visit: Admitting: Surgical

## 2024-06-04 ENCOUNTER — Ambulatory Visit: Payer: Self-pay | Admitting: Surgical

## 2024-06-04 ENCOUNTER — Other Ambulatory Visit (HOSPITAL_COMMUNITY): Payer: Self-pay

## 2024-06-04 DIAGNOSIS — M4316 Spondylolisthesis, lumbar region: Secondary | ICD-10-CM | POA: Diagnosis not present

## 2024-06-04 DIAGNOSIS — Z6832 Body mass index (BMI) 32.0-32.9, adult: Secondary | ICD-10-CM | POA: Diagnosis not present

## 2024-06-08 ENCOUNTER — Encounter: Payer: Self-pay | Admitting: Family Medicine

## 2024-06-08 DIAGNOSIS — M542 Cervicalgia: Secondary | ICD-10-CM

## 2024-06-10 ENCOUNTER — Other Ambulatory Visit (HOSPITAL_COMMUNITY): Payer: Self-pay

## 2024-06-10 ENCOUNTER — Other Ambulatory Visit: Payer: Self-pay

## 2024-06-10 MED ORDER — DIAZEPAM 5 MG PO TABS
5.0000 mg | ORAL_TABLET | ORAL | 0 refills | Status: DC | PRN
  Filled 2024-06-10: qty 2, 1d supply, fill #0

## 2024-06-14 ENCOUNTER — Encounter (HOSPITAL_COMMUNITY): Payer: Self-pay

## 2024-06-14 ENCOUNTER — Telehealth: Admitting: Physician Assistant

## 2024-06-14 ENCOUNTER — Other Ambulatory Visit (HOSPITAL_COMMUNITY): Payer: Self-pay

## 2024-06-14 ENCOUNTER — Other Ambulatory Visit: Payer: Self-pay

## 2024-06-14 DIAGNOSIS — H60331 Swimmer's ear, right ear: Secondary | ICD-10-CM | POA: Diagnosis not present

## 2024-06-14 MED ORDER — CIPROFLOXACIN-DEXAMETHASONE 0.3-0.1 % OT SUSP
4.0000 [drp] | Freq: Two times a day (BID) | OTIC | 0 refills | Status: AC
  Filled 2024-06-14: qty 7.5, 19d supply, fill #0

## 2024-06-14 NOTE — Progress Notes (Signed)
 E Visit for Ear Pain - Swimmer's Ear  We are sorry that you are not feeling well. Here is how we plan to help!  Based on what you have shared with me it looks like you have Swimmer's Ear.  Swimmer's ear is a redness or swelling, irritation, or infection of your outer ear canal. These symptoms usually occur within a few days of swimming. Your ear canal is a tube that goes from the opening of the ear to the eardrum.  When water stays in your ear canal, germs can grow.  This is a painful condition that often happens to children and swimmers of all ages.  It is not contagious and oral antibiotics are not required to treat uncomplicated swimmer's ear.  The usual symptoms include:    Itchiness inside the ear  Redness or a sense of swelling in the ear  Pain when the ear is tugged on when pressure is placed on the ear  Pus draining from the infected ear   I have prescribed: Ciprofloxacin 0.3% and dexamethasone 0.1% otic suspension four drops in affected ears two times a day for 7 days  In certain cases, swimmer's ear may progress to a more serious bacterial infection of the middle or inner ear.  If you have a fever 102 and up and significantly worsening symptoms, this could indicate a more serious infection moving to the middle/inner and needs face to face evaluation in an office by a provider.  Your symptoms should improve over the next 3 days and should resolve in about 7 days.  Be sure to complete ALL of your prescription.  HOME CARE: Wash your hands frequently. If you are prescribed an ear drop, do not place the tip of the bottle on your ear or touch it with your fingers. You can take Acetaminophen 650 mg every 4-6 hours as needed for pain.  If pain is severe or moderate, you can apply a heating pad (set on low) or hot water bottle (wrapped in a towel) to outer ear for 20 minutes.  This will also increase drainage. Avoid ear plugs Do not go swimming until the symptoms are gone Do not use  Q-tips After showers, help the water run out by tilting your head to one side.   GET HELP RIGHT AWAY IF: Fever is over 102.2 degrees. You develop progressive ear pain or hearing loss. Ear symptoms persist longer than 3 days after treatment.  MAKE SURE YOU: Understand these instructions. Will watch your condition. Will get help right away if you are not doing well or get worse.  TO PREVENT SWIMMER'S EAR: Use a bathing cap or custom fitted swim molds to keep your ears dry. Towel off after swimming to dry your ears. Tilt your head or pull your earlobes to allow the water to escape your ear canal. If there is still water in your ears, consider using a hairdryer on the lowest setting.  Thank you for choosing an e-visit.  Your e-visit answers were reviewed by a board certified advanced clinical practitioner to complete your personal care plan. Depending upon the condition, your plan could have included both over the counter or prescription medications.  Please review your pharmacy choice. Make sure the pharmacy is open so you can pick up the prescription now. If there is a problem, you may contact your provider through [employer] and have the prescription routed to another pharmacy.  Your safety is important to [place]. If you have drug allergies check your prescription carefully.  For the next 24 hours you can use MyChart to ask questions about today's visit, request a non-urgent call back, or ask for a work or school excuse. You will get an email with a survey after your eVisit asking about your experience. We would appreciate your feedback. I hope that your e-visit has been valuable and will aid in your recovery.   I have spent 5 minutes in review of e-visit questionnaire, review and updating patient chart, medical decision making and response to patient.   Margaretann Loveless, PA-C

## 2024-06-18 ENCOUNTER — Ambulatory Visit
Admission: RE | Admit: 2024-06-18 | Discharge: 2024-06-18 | Disposition: A | Discharge status: Home / Self Care | Source: Ambulatory Visit | Attending: Family Medicine | Admitting: Family Medicine

## 2024-06-18 ENCOUNTER — Other Ambulatory Visit (HOSPITAL_COMMUNITY): Payer: Self-pay

## 2024-06-18 ENCOUNTER — Ambulatory Visit: Admitting: Nurse Practitioner

## 2024-06-18 ENCOUNTER — Encounter: Payer: Self-pay | Admitting: Obstetrics and Gynecology

## 2024-06-18 ENCOUNTER — Encounter: Payer: Self-pay | Admitting: Nurse Practitioner

## 2024-06-18 ENCOUNTER — Ambulatory Visit (INDEPENDENT_AMBULATORY_CARE_PROVIDER_SITE_OTHER): Admitting: Obstetrics and Gynecology

## 2024-06-18 ENCOUNTER — Encounter (HOSPITAL_COMMUNITY): Payer: Self-pay

## 2024-06-18 VITALS — BP 114/64 | HR 74 | Resp 16 | Ht 65.25 in | Wt 202.0 lb

## 2024-06-18 DIAGNOSIS — N393 Stress incontinence (female) (male): Secondary | ICD-10-CM

## 2024-06-18 DIAGNOSIS — N950 Postmenopausal bleeding: Secondary | ICD-10-CM

## 2024-06-18 DIAGNOSIS — N3281 Overactive bladder: Secondary | ICD-10-CM

## 2024-06-18 DIAGNOSIS — M542 Cervicalgia: Secondary | ICD-10-CM

## 2024-06-18 DIAGNOSIS — M4722 Other spondylosis with radiculopathy, cervical region: Secondary | ICD-10-CM | POA: Diagnosis not present

## 2024-06-18 MED ORDER — MIRABEGRON ER 50 MG PO TB24
50.0000 mg | ORAL_TABLET | Freq: Every day | ORAL | 11 refills | Status: AC
  Filled 2024-06-18: qty 30, 30d supply, fill #0
  Filled 2024-07-15: qty 30, 30d supply, fill #1
  Filled 2024-08-12: qty 30, 30d supply, fill #2
  Filled 2024-09-11: qty 30, 30d supply, fill #3
  Filled 2024-10-08: qty 30, 30d supply, fill #4
  Filled 2024-11-07: qty 30, 30d supply, fill #5
  Filled 2024-12-07: qty 30, 30d supply, fill #6
  Filled 2025-01-02: qty 90, 90d supply, fill #7

## 2024-06-18 NOTE — Progress Notes (Signed)
 [facility] Return Visit  SUBJECTIVE  History of Present Illness: Amanda James is a 53 y.o. female seen in follow-up for mixed incontinence.  Urgency has improved enough, especially in the morning she is able to hold it better. Still has some urgency throughout the day. SUI is present but not as bothersome.   Had appt with GYN today for workup for PMB.   Prior Urodynamic Impression:  1. Sensation was normal; capacity was normal 2. Stress Incontinence was demonstrated at normal pressures; 3. Detrusor Overactivity was demonstrated without leakage. 4. Emptying was dysfunctional with a elevated PVR ( ), a sustained detrusor contraction not present,  abdominal straining present, dyssynergic urethral sphincter activity on EMG.  Past Medical History: Patient  has a past medical history of Allergy, Arthritis, Cervical cancer screening (05/02/2017), Depression, Depression with anxiety, Diabetes mellitus type 2 in obese, Diabetes mellitus without complication (HCC), GERD (gastroesophageal reflux disease), History of chicken pox, Hyperlipidemia, Low back pain, Migraines, Obesity (11/10/2016), PFO (patent foramen ovale), and STD (sexually transmitted disease).   Past Surgical History: She  has a past surgical history that includes Incontinence surgery (2016); Abdominoplasty (2012); Mastopexy (2012); Laparoscopic gastric banding (2005); Augmentation mammaplasty (2014); and Cosmetic surgery.   Medications: She has a current medication list which includes the following prescription(s): mirabegron  er, aspirin  ec, atorvastatin , betamethasone  dipropionate, freestyle lite, botulinum toxin type a , bupropion  er, cetirizine hcl, ciprofloxacin -dexamethasone , premarin , cyclobenzaprine , enalapril , escitalopram , fluticasone propionate, freestyle lite, freestyle, minoxidil, prevident  5000 sensitive, mounjaro , and ubrelvy , and the following Facility-Administered Medications: gadobenate dimeglumine   and sodium chloride  flush.   Allergies: Patient is allergic to erythromycin.   Social History: Patient  reports that she has never smoked. She has never been exposed to tobacco smoke. She has never used smokeless tobacco. She reports that she does not currently use alcohol. She reports that she does not use drugs.      OBJECTIVE     Physical Exam: There were no vitals filed for this visit.   Gen: No apparent distress, A&O x 3.  Detailed Urogynecologic Evaluation:  Deferred.    ASSESSMENT AND PLAN    Amanda James is a 53 y.o. with:  1. Overactive bladder      OAB - Increase Myrbetriq  to 50mg .  - We discussed alternatives if she does not see improvement, including PTNS, SNM and intravesical botox .    2. SUI - will monitor   Follow up 2 months   Amanda LOISE Caper, MD

## 2024-06-18 NOTE — Progress Notes (Signed)
   Acute Office Visit  Subjective:    Patient ID: Amanda James, female    DOB: 21-Aug-1971, 53 y.o.   MRN: [id number]   HPI 53 y.o. G1P1001 presents as new patient for postmenopausal bleeding. Had period-like bleeding about 6 months ago that lasted 4 days. LMP prior to this was June 2022. US  ordered by urogyn who she is seeing for OAB, but US  has not been scheduled yet. Not on HRT. Sexually active, no pain or bleeding with intercourse. Normal pap history, UTD.   Patient's last menstrual period was 05/18/2021 (exact date).    Review of Systems  Constitutional: Negative.   Genitourinary:  Positive for vaginal bleeding (6 months ago).       Objective:    Physical Exam Exam conducted with a chaperone present.  Constitutional:      Appearance: Normal appearance.  Genitourinary:    General: Normal vulva.     Vagina: Normal.     Cervix: Normal.     Uterus: Normal.      Adnexa: Right adnexa normal and left adnexa normal.     BP 114/64   Pulse 74   Resp 16   Ht 5' 5.25 (1.657 m)   Wt 202 lb (91.6 kg)   LMP 05/18/2021 (Exact Date)   BMI 33.36 kg/m  Wt Readings from Last 3 Encounters:  06/18/24 202 lb (91.6 kg)  05/07/24 204 lb 3.2 oz (92.6 kg)  01/23/24 201 lb (91.2 kg)        Zada Louder, CMA present as chaperone.   Assessment & Plan:   Problem List Items Addressed This Visit   None Visit Diagnoses       Postmenopausal bleeding    -  Primary   Relevant Orders   US  PELVIS TRANSVAGINAL NON-OB (TV ONLY)      Plan: Will schedule ultrasound. Discussed possibility of EMB at that time. Discussed what to expect and risks with procedure.     Annabella DELENA Shutter DNP, 9:33 AM 06/18/2024

## 2024-06-21 ENCOUNTER — Ambulatory Visit: Payer: Self-pay | Admitting: Family Medicine

## 2024-06-21 NOTE — Progress Notes (Signed)
 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  This could be a source of neck pain.  I have you scheduled an appointment with Dr. Darlis yet?  These facet levels could be treated with medial branch block and ablation as well.

## 2024-06-25 ENCOUNTER — Ambulatory Visit (INDEPENDENT_AMBULATORY_CARE_PROVIDER_SITE_OTHER): Payer: Self-pay | Admitting: Surgical

## 2024-06-25 DIAGNOSIS — Z719 Counseling, unspecified: Secondary | ICD-10-CM

## 2024-06-25 NOTE — Progress Notes (Signed)
 Filler Injection Procedure Note   Procedure:  Filler administration   Pre-operative Diagnosis: Encounter for lip, nasolabial fold and cheek filler   Post-operative Diagnosis: Same   Complications:  None   Brief history: Patient is interested in lip filler for improving lower lip volume as well as improving philtral column softening.  She has previously had some cheek filler with her most recent treatment occurring about 2 months ago.  She was overall very happy with those results, but had some malar flattening still present on the left side and was interested in additional improvement.  She reports today that she is interested in an additional treatment of her bilateral cheeks.  I discussed with the patient the proposed procedure for lip filler and cheek injections. We discussed that filler lasts ~ 1 year or longer depending on the type of filler injected.  It is performed as a temporary correction.  Risks previously addressed including bleeding, scarring, infection, vascular occlusion resulting in skin necrosis, vision loss or other vascular complications, asymmetry, formation of granulomas.   Procedure: The area was prepped with alcohol and dried with a clean gauze.  Attention was focused on the patient's upper lips, a single linear thread was injected along the philtral column bilaterally. Serial threads were then placed in the pillow of the lower lip along the wet-to-dry border.  A total of 0.4 cc was injected in the philtral columns and lower lip. Care was taken with each injection to aspirate prior to injection to ensure proper needle placement.  Attention was then turned to her left nasolabial fold.  0.15 to 0.2 cc was injected in the left nasolabial fold.  Care was taken with each injection to aspirate prior to injection to ensure proper needle placement.  The lip and nasolabial fold was injected with VollBella.  Attention was then turned to her bilateral cheeks.  Markings were placed to  ensure symmetry was further evaluated.  The cheeks were injected from anterior to posterior, each injection placed in the supraperiosteal plane.  Aspiration performed with each injection to ensure proper needle placement.  Slight asymmetric injection was performed with more filler being placed in the left malar area to improve asymmetry.  A total of 1 cc was injected.   The patient tolerated the procedure well, there were no complications.  No apparent bruising was noted, good capillary refill of all tissue was noted at the end of the procedure.  Restylane Contour Lot #[id number], expires 08/08/2025 Voll Bella, XC 0.5 cc expires July 25, 2024.  Lot [id number]

## 2024-06-29 ENCOUNTER — Encounter: Payer: Self-pay | Admitting: Family Medicine

## 2024-06-30 ENCOUNTER — Other Ambulatory Visit: Payer: Self-pay | Admitting: Family Medicine

## 2024-06-30 DIAGNOSIS — E782 Mixed hyperlipidemia: Secondary | ICD-10-CM

## 2024-06-30 DIAGNOSIS — E6609 Other obesity due to excess calories: Secondary | ICD-10-CM

## 2024-06-30 DIAGNOSIS — G43709 Chronic migraine without aura, not intractable, without status migrainosus: Secondary | ICD-10-CM

## 2024-06-30 DIAGNOSIS — E1169 Type 2 diabetes mellitus with other specified complication: Secondary | ICD-10-CM

## 2024-06-30 DIAGNOSIS — E559 Vitamin D deficiency, unspecified: Secondary | ICD-10-CM

## 2024-07-01 ENCOUNTER — Other Ambulatory Visit: Payer: Self-pay

## 2024-07-01 ENCOUNTER — Other Ambulatory Visit: Payer: Self-pay | Admitting: Family Medicine

## 2024-07-01 ENCOUNTER — Other Ambulatory Visit (HOSPITAL_COMMUNITY): Payer: Self-pay

## 2024-07-01 MED ORDER — ENALAPRIL MALEATE 2.5 MG PO TABS
2.5000 mg | ORAL_TABLET | Freq: Every day | ORAL | 1 refills | Status: DC
  Filled 2024-07-01: qty 90, 90d supply, fill #0
  Filled 2024-10-08: qty 90, 90d supply, fill #1

## 2024-07-01 MED ORDER — MOUNJARO 15 MG/0.5ML ~~LOC~~ SOAJ
15.0000 mg | SUBCUTANEOUS | 1 refills | Status: DC
  Filled 2024-07-01: qty 6, 84d supply, fill #0
  Filled 2024-09-22: qty 6, 84d supply, fill #1

## 2024-07-02 ENCOUNTER — Other Ambulatory Visit (INDEPENDENT_AMBULATORY_CARE_PROVIDER_SITE_OTHER)

## 2024-07-02 DIAGNOSIS — G43709 Chronic migraine without aura, not intractable, without status migrainosus: Secondary | ICD-10-CM

## 2024-07-02 DIAGNOSIS — E782 Mixed hyperlipidemia: Secondary | ICD-10-CM | POA: Diagnosis not present

## 2024-07-02 DIAGNOSIS — E1169 Type 2 diabetes mellitus with other specified complication: Secondary | ICD-10-CM

## 2024-07-02 DIAGNOSIS — E669 Obesity, unspecified: Secondary | ICD-10-CM

## 2024-07-02 DIAGNOSIS — E559 Vitamin D deficiency, unspecified: Secondary | ICD-10-CM

## 2024-07-02 LAB — CBC WITH DIFFERENTIAL/PLATELET
Basophils Absolute: 0 K/uL (ref 0.0–0.1)
Basophils Relative: 0.4 % (ref 0.0–3.0)
Eosinophils Absolute: 0.1 K/uL (ref 0.0–0.7)
Eosinophils Relative: 1.9 % (ref 0.0–5.0)
HCT: 40.9 % (ref 36.0–46.0)
Hemoglobin: 13.9 g/dL (ref 12.0–15.0)
Lymphocytes Relative: 32.7 % (ref 12.0–46.0)
Lymphs Abs: 2.2 K/uL (ref 0.7–4.0)
MCHC: 34 g/dL (ref 30.0–36.0)
MCV: 95.1 fl (ref 78.0–100.0)
Monocytes Absolute: 0.5 K/uL (ref 0.1–1.0)
Monocytes Relative: 7.2 % (ref 3.0–12.0)
Neutro Abs: 3.8 K/uL (ref 1.4–7.7)
Neutrophils Relative %: 57.8 % (ref 43.0–77.0)
Platelets: 271 K/uL (ref 150.0–400.0)
RBC: 4.3 Mil/uL (ref 3.87–5.11)
RDW: 13.5 % (ref 11.5–15.5)
WBC: 6.6 K/uL (ref 4.0–10.5)

## 2024-07-02 LAB — LIPID PANEL
Cholesterol: 147 mg/dL (ref 0–200)
HDL: 66 mg/dL (ref >39.00)
LDL Cholesterol: 65 mg/dL (ref 0–99)
NonHDL: 80.85
Total CHOL/HDL Ratio: 2
Triglycerides: 80 mg/dL (ref 0.0–149.0)
VLDL: 16 mg/dL (ref 0.0–40.0)

## 2024-07-02 LAB — COMPREHENSIVE METABOLIC PANEL WITH GFR
ALT: 38 U/L — ABNORMAL HIGH (ref 0–35)
AST: 26 U/L (ref 0–37)
Albumin: 4.6 g/dL (ref 3.5–5.2)
Alkaline Phosphatase: 68 U/L (ref 39–117)
BUN: 18 mg/dL (ref 6–23)
CO2: 25 meq/L (ref 19–32)
Calcium: 9.3 mg/dL (ref 8.4–10.5)
Chloride: 103 meq/L (ref 96–112)
Creatinine, Ser: 0.83 mg/dL (ref 0.40–1.20)
GFR: 80.2 mL/min (ref >60.00)
Glucose, Bld: 92 mg/dL (ref 70–99)
Potassium: 4.1 meq/L (ref 3.5–5.1)
Sodium: 137 meq/L (ref 135–145)
Total Bilirubin: 1.1 mg/dL (ref 0.2–1.2)
Total Protein: 7.5 g/dL (ref 6.0–8.3)

## 2024-07-02 LAB — HEMOGLOBIN A1C: Hgb A1c MFr Bld: 5.4 % (ref 4.6–6.5)

## 2024-07-02 LAB — VITAMIN D 25 HYDROXY (VIT D DEFICIENCY, FRACTURES): VITD: 46.05 ng/mL (ref 30.00–100.00)

## 2024-07-02 LAB — TSH: TSH: 3.47 u[IU]/mL (ref 0.35–5.50)

## 2024-07-03 LAB — APOLIPOPROTEIN B: Apolipoprotein B: 55 mg/dL (ref <90)

## 2024-07-04 ENCOUNTER — Ambulatory Visit: Payer: Self-pay | Admitting: Family Medicine

## 2024-07-07 NOTE — Assessment & Plan Note (Signed)
 Tolerating statin, encouraged heart healthy diet, avoid trans fats, minimize simple carbs and saturated fats. Increase exercise as tolerated

## 2024-07-07 NOTE — Assessment & Plan Note (Signed)
 Supplement and monitor

## 2024-07-07 NOTE — Assessment & Plan Note (Signed)
Following with neurology and doing well on current meds

## 2024-07-07 NOTE — Assessment & Plan Note (Signed)
 hgba1c acceptable, minimize simple carbs. Increase exercise as tolerated. Continue current meds

## 2024-07-07 NOTE — Assessment & Plan Note (Addendum)
 Wellbutrin  SR 100 mg bid and Escitalopram  doing well

## 2024-07-08 ENCOUNTER — Ambulatory Visit: Payer: 59 | Admitting: Family Medicine

## 2024-07-08 ENCOUNTER — Other Ambulatory Visit

## 2024-07-08 VITALS — BP 110/60 | HR 68 | Resp 16 | Ht 65.25 in | Wt 200.4 lb

## 2024-07-08 DIAGNOSIS — E1169 Type 2 diabetes mellitus with other specified complication: Secondary | ICD-10-CM

## 2024-07-08 DIAGNOSIS — F419 Anxiety disorder, unspecified: Secondary | ICD-10-CM | POA: Diagnosis not present

## 2024-07-08 DIAGNOSIS — M47816 Spondylosis without myelopathy or radiculopathy, lumbar region: Secondary | ICD-10-CM | POA: Diagnosis not present

## 2024-07-08 DIAGNOSIS — G43709 Chronic migraine without aura, not intractable, without status migrainosus: Secondary | ICD-10-CM | POA: Diagnosis not present

## 2024-07-08 DIAGNOSIS — E669 Obesity, unspecified: Secondary | ICD-10-CM

## 2024-07-08 DIAGNOSIS — E559 Vitamin D deficiency, unspecified: Secondary | ICD-10-CM | POA: Diagnosis not present

## 2024-07-08 DIAGNOSIS — R002 Palpitations: Secondary | ICD-10-CM | POA: Diagnosis not present

## 2024-07-08 DIAGNOSIS — F32A Depression, unspecified: Secondary | ICD-10-CM

## 2024-07-08 DIAGNOSIS — E782 Mixed hyperlipidemia: Secondary | ICD-10-CM

## 2024-07-08 LAB — LIPOPROTEIN A (LPA): Lipoprotein (a): 17 nmol/L (ref <75)

## 2024-07-08 LAB — INSULIN, RANDOM: Insulin: 25.1 u[IU]/mL — ABNORMAL HIGH

## 2024-07-08 LAB — MICROALBUMIN / CREATININE URINE RATIO
Creatinine,U: 51.5 mg/dL
Microalb Creat Ratio: UNDETERMINED mg/g (ref 0.0–30.0)
Microalb, Ur: <0.7 mg/dL

## 2024-07-08 NOTE — Progress Notes (Signed)
 Subjective:    Patient ID: Amanda James, female    DOB: 04/18/1971, 53 y.o.   MRN: [id number]  Chief Complaint  Patient presents with   Medical Management of Chronic Issues    Patient presents today for a 16 month follow-up.   Quality Metric Gaps    Hep B, pneumococcal, zoster vaccines, foot exam, urine microalbumin.    HPI Discussed the use of AI scribe software for clinical note transcription with the patient, who gave verbal consent to proceed.  History of Present Illness Dr. Louann JONETTA Ozimek is a 53 year old female who presents for follow-up on musculoskeletal issues and cardiac symptoms.  She experiences ongoing musculoskeletal pain, particularly in her back and neck. She is scheduled for her fourth ablation later today. Her neck issues have worsened, and an MRI showed arthritis. She engages in physical therapy exercises for about fifteen minutes daily and has been doing more indoor walking due to the heat.  She experiences premature ventricular contractions (PVCs) at unusual times, not necessarily related to stress. These episodes occur daily, lasting seconds, and can happen at any time of the day. She describes the sensation as a 'fluttery, uncomfortable feel' and likens it to a 'butterfly.' There are no associated symptoms like presyncope, chest pain, sweating, or nausea. Her father had a history of cardiac issues, including Afib, and passed away at 61, which raises her concern about her cardiac health.  She has a history of elevated ALT levels. She has not had any recent falls, stomach troubles, or ER visits. She maintains a hydration routine, aiming for at least eighty ounces of fluid daily.    Past Medical History:  Diagnosis Date   Allergy    Arthritis    back   Cervical cancer screening 05/02/2017   Depression    Depression with anxiety    Diabetes mellitus type 2 in obese    Diabetes mellitus without complication (HCC)    GERD (gastroesophageal reflux disease)     History of chicken pox    Hyperlipidemia    Low back pain    Migraines    Obesity 11/10/2016   PFO (patent foramen ovale)    STD (sexually transmitted disease)    trichomonas treated many yrs ago    Past Surgical History:  Procedure Laterality Date   ABDOMINOPLASTY  2012   AUGMENTATION MAMMAPLASTY  2014   maxilift with implants    COSMETIC SURGERY     INCONTINENCE SURGERY  2016   LAPAROSCOPIC GASTRIC BANDING  2005   esophageal dilation. band still present but released   MASTOPEXY  2012    Family History  Problem Relation Age of Onset   Diabetes Mother    Hypertension Mother    Hyperlipidemia Mother    Heart disease Mother        pace maker   Colon polyps Father    Diabetes Father    Heart disease Father        MI   Hyperlipidemia Father    Hypertension Father    Heart attack Father    Diabetes Sister    Hyperlipidemia Sister    Hypertension Sister    Depression Sister    Early death Sister    Hypertension Maternal Grandmother    Hyperlipidemia Maternal Grandmother    Diabetes Maternal Grandmother    Non-Hodgkin's lymphoma Maternal Grandmother    Arthritis Maternal Grandmother        spinal stenosis   Multiple myeloma Maternal Grandfather  Hyperlipidemia Paternal Grandmother        rhabdo from statins   Kidney disease Paternal Grandmother    Healthy Son    Esophageal cancer Paternal Uncle     Social History   Socioeconomic History   Marital status: Married    Spouse name: Not on file   Number of children: 1   Years of education: MD   Highest education level: Professional school degree (e.g., MD, DDS, DVM, JD)  Occupational History   Occupation: [employer]  Tobacco Use   Smoking status: Never    Passive exposure: Never   Smokeless tobacco: Never  Vaping Use   Vaping status: Never Used  Substance and Sexual Activity   Alcohol use: Not Currently    Comment: 5 glasses of wine per week   Drug use: No   Sexual activity: Yes    Partners: Male     Birth control/protection: Post-menopausal  Other Topics Concern   Not on file  Social History Narrative   Works With [employer], lives with husband, 3 cats   No major dietary restrictions. Exercise 30 minutes 3 x a week   Seat belts routinely   Right-handed   Caffeine: 1-2 cups of coffee per day   Social Drivers of Health   Financial Resource Strain: Low Risk  (07/08/2024)   Overall Financial Resource Strain (CARDIA)    Difficulty of Paying Living Expenses: Not hard at all  Food Insecurity: No Food Insecurity (07/08/2024)   Hunger Vital Sign    Worried About Running Out of Food in the Last Year: Never true    Ran Out of Food in the Last Year: Never true  Transportation Needs: No Transportation Needs (07/08/2024)   PRAPARE - Administrator, Civil Service (Medical): No    Lack of Transportation (Non-Medical): No  Physical Activity: Insufficiently Active (07/08/2024)   Exercise Vital Sign    Days of Exercise per Week: 3 days    Minutes of Exercise per Session: 40 min  Stress: No Stress Concern Present (07/08/2024)   [employer] of Occupational Health - Occupational Stress Questionnaire    Feeling of Stress: Only a little  Social Connections: Socially Isolated (07/08/2024)   Social Connection and Isolation Panel    Frequency of Communication with Friends and Family: Once a week    Frequency of Social Gatherings with Friends and Family: Once a week    Attends Religious Services: Never    Database administrator or Organizations: No    Attends Engineer, structural: Not on file    Marital Status: Married  Catering manager Violence: Not on file    Outpatient Medications Prior to Visit  Medication Sig Dispense Refill   atorvastatin  (LIPITOR) 20 MG tablet Take 1 tablet (20 mg total) by mouth daily. 90 tablet 1   betamethasone  dipropionate 0.05 % cream Apply topically 2 (two) times daily. 45 g 0   Blood Glucose Monitoring Suppl (FREESTYLE LITE) w/Device KIT Use to  test blood sugar 4 times a day 1 kit 0   buPROPion  ER (WELLBUTRIN  SR) 100 MG 12 hr tablet Take 1 tablet (100 mg total) by mouth 2 (two) times daily. 180 tablet 0   Cetirizine HCl 10 MG CAPS Take 1 capsule by mouth daily.     conjugated estrogens  (PREMARIN ) vaginal cream Place 1 applicatorful vaginally 2 times a week. 30 g 1   cyclobenzaprine  (FLEXERIL ) 10 MG tablet Take 1 tablet (10 mg total) by mouth 3 (three) times  daily as needed for muscle spasms 30 tablet 3   enalapril  (VASOTEC ) 2.5 MG tablet Take 1 tablet (2.5 mg total) by mouth daily. 90 tablet 1   escitalopram  (LEXAPRO ) 20 MG tablet Take 1 tablet (20 mg total) by mouth every morning. 90 tablet 1   Fluticasone Propionate (FLONASE ALLERGY RELIEF NA) Place into the nose daily.     glucose blood (FREESTYLE LITE) test strip Use to test blood sugar 4 times a day 100 each 12   Lancets (FREESTYLE) lancets Use to test blood sugar 4 times a day 100 each 12   minoxidil (LONITEN) 2.5 MG tablet Take 0.5 tablets (1.25 mg total) by mouth daily. (Patient taking differently: Take 2.5 mg by mouth daily.)     mirabegron  ER (MYRBETRIQ ) 50 MG TB24 tablet Take 1 tablet (50 mg total) by mouth daily. 30 tablet 11   Sod Fluoride -Potassium Nitrate  (PREVIDENT  5000 SENSITIVE) 1.1-5 % GEL Brush on teeth 2 times a day for 2 minutes each time then spit as much as possible then DO NOT swish, eat or drink for 30 minutes 100 mL 12   tirzepatide  (MOUNJARO ) 15 MG/0.5ML Pen Inject 15 mg into the skin once a week. 6 mL 1   Ubrogepant  (UBRELVY ) 100 MG TABS Take 1 tablet by mouth at onset of migraine as needed as directed. May repeat once in 2 hours if headache persists. Maximum 2 tablets per day. 16 tablet 11   aspirin  EC 81 MG tablet Take 1 tablet (81 mg total) by mouth daily. Swallow whole. 90 tablet 3   botulinum toxin Type A  (BOTOX ) 200 units injection Inject 155 Units into the muscle every 3 (three) months. 1 each 1   Facility-Administered Medications Prior to Visit   Medication Dose Route Frequency Provider Last Rate Last Admin   gadobenate dimeglumine  (MULTIHANCE ) injection 20 mL  20 mL Intravenous Once PRN Ahern, Antonia B, MD       sodium chloride  flush (NS) 0.9 % injection 10 mL  10 mL Intravenous PRN Ahern, Antonia B, MD   20 mL at 10/26/20 1345    Allergies  Allergen Reactions   Erythromycin Diarrhea    Review of Systems  Constitutional:  Negative for fever and malaise/fatigue.  HENT:  Negative for congestion.   Eyes:  Negative for blurred vision.  Respiratory:  Negative for shortness of breath.   Cardiovascular:  Positive for palpitations. Negative for chest pain and leg swelling.  Gastrointestinal:  Negative for abdominal pain, blood in stool and nausea.  Genitourinary:  Negative for dysuria and frequency.  Musculoskeletal:  Positive for back pain, joint pain and neck pain. Negative for falls.  Skin:  Negative for rash.  Neurological:  Negative for dizziness, loss of consciousness and headaches.  Endo/Heme/Allergies:  Negative for environmental allergies.  Psychiatric/Behavioral:  Negative for depression. The patient is not nervous/anxious.        Objective:    Physical Exam Constitutional:      General: She is not in acute distress.    Appearance: Normal appearance. She is well-developed. She is not toxic-appearing.  HENT:     Head: Normocephalic and atraumatic.     Right Ear: External ear normal.     Left Ear: External ear normal.     Nose: Nose normal.  Eyes:     General:        Right eye: No discharge.        Left eye: No discharge.     Conjunctiva/sclera: Conjunctivae normal.  Neck:  Thyroid : No thyromegaly.  Cardiovascular:     Rate and Rhythm: Normal rate and regular rhythm.     Heart sounds: Normal heart sounds. No murmur heard. Pulmonary:     Effort: Pulmonary effort is normal. No respiratory distress.     Breath sounds: Normal breath sounds.  Abdominal:     General: Bowel sounds are normal.      Palpations: Abdomen is soft.     Tenderness: There is no abdominal tenderness. There is no guarding.  Musculoskeletal:        General: Normal range of motion.     Cervical back: Neck supple.  Lymphadenopathy:     Cervical: No cervical adenopathy.  Skin:    General: Skin is warm and dry.  Neurological:     Mental Status: She is alert and oriented to person, place, and time.  Psychiatric:        Mood and Affect: Mood normal.        Behavior: Behavior normal.        Thought Content: Thought content normal.        Judgment: Judgment normal.     BP 110/60   Pulse 68   Resp 16   Ht 5' 5.25 (1.657 m)   Wt 200 lb 6.4 oz (90.9 kg)   LMP 05/18/2021 (Exact Date)   SpO2 98%   BMI 33.09 kg/m  Wt Readings from Last 3 Encounters:  07/08/24 200 lb 6.4 oz (90.9 kg)  06/18/24 202 lb (91.6 kg)  05/07/24 204 lb 3.2 oz (92.6 kg)    Diabetic Foot Exam - Simple   Simple Foot Form Diabetic Foot exam was performed with the following findings: Yes 07/08/2024  9:10 AM  Visual Inspection No deformities, no ulcerations, no other skin breakdown bilaterally: Yes Sensation Testing Intact to touch and monofilament testing bilaterally: Yes Pulse Check Posterior Tibialis and Dorsalis pulse intact bilaterally: Yes Comments    Lab Results  Component Value Date   WBC 6.6 07/02/2024   HGB 13.9 07/02/2024   HCT 40.9 07/02/2024   PLT 271.0 07/02/2024   GLUCOSE 92 07/02/2024   CHOL 147 07/02/2024   TRIG 80.0 07/02/2024   HDL 66.00 07/02/2024   LDLCALC 65 07/02/2024   ALT 38 (H) 07/02/2024   AST 26 07/02/2024   NA 137 07/02/2024   K 4.1 07/02/2024   CL 103 07/02/2024   CREATININE 0.83 07/02/2024   BUN 18 07/02/2024   CO2 25 07/02/2024   TSH 3.47 07/02/2024   INR 1.1 06/01/2022   HGBA1C 5.4 07/02/2024   MICROALBUR <0.7 07/08/2024    Lab Results  Component Value Date   TSH 3.47 07/02/2024   Lab Results  Component Value Date   WBC 6.6 07/02/2024   HGB 13.9 07/02/2024   HCT 40.9  07/02/2024   MCV 95.1 07/02/2024   PLT 271.0 07/02/2024   Lab Results  Component Value Date   NA 137 07/02/2024   K 4.1 07/02/2024   CO2 25 07/02/2024   GLUCOSE 92 07/02/2024   BUN 18 07/02/2024   CREATININE 0.83 07/02/2024   BILITOT 1.1 07/02/2024   ALKPHOS 68 07/02/2024   AST 26 07/02/2024   ALT 38 (H) 07/02/2024   PROT 7.5 07/02/2024   ALBUMIN 4.6 07/02/2024   CALCIUM  9.3 07/02/2024   ANIONGAP 10 06/01/2022   EGFR 83 08/29/2023   GFR 80.20 07/02/2024   Lab Results  Component Value Date   CHOL 147 07/02/2024   Lab Results  Component Value Date  HDL 66.00 07/02/2024   Lab Results  Component Value Date   LDLCALC 65 07/02/2024   Lab Results  Component Value Date   TRIG 80.0 07/02/2024   Lab Results  Component Value Date   CHOLHDL 2 07/02/2024   Lab Results  Component Value Date   HGBA1C 5.4 07/02/2024       Assessment & Plan:  Chronic migraine w/o aura w/o status migrainosus, not intractable Assessment & Plan: Following with neurology and doing well on current meds  Orders: -     CBC with Differential/Platelet; Future  Mixed hyperlipidemia Assessment & Plan: Tolerating statin, encouraged heart healthy diet, avoid trans fats, minimize simple carbs and saturated fats. Increase exercise as tolerated   Orders: -     Lipid panel; Future -     TSH; Future  Type 2 diabetes mellitus with obesity (HCC) Assessment & Plan: hgba1c acceptable, minimize simple carbs. Increase exercise as tolerated. Continue current meds   Orders: -     Microalbumin / creatinine urine ratio -     Comprehensive metabolic panel with GFR; Future -     CBC with Differential/Platelet; Future -     TSH; Future -     Insulin , random; Future  Vitamin D  deficiency Assessment & Plan: Supplement and monitor   Orders: -     VITAMIN D  25 Hydroxy (Vit-D Deficiency, Fractures); Future  Anxiety and depression Assessment & Plan: Wellbutrin  SR 100 mg bid and Escitalopram  doing  well   Palpitations -     TSH; Future    Assessment and Plan Assessment & Plan Palpitations, possible premature ventricular contractions Experiencing palpitations described as fluttery, uncomfortable sensations, occurring daily for seconds. No associated presyncope, chest pain, sweating, or nausea. Family history of cardiac issues, including father's death at 37 from cardiac causes and atrial fibrillation. Differential includes PVCs versus other arrhythmias. Discussed CardiaMobile for capturing episodes, though brief duration may limit utility. Consider Zio monitor for comprehensive assessment due to daily symptoms. - Order Zio monitor for two weeks to capture arrhythmias. - Advise use of CardiaMobile to capture episodes if possible.  Chronic musculoskeletal pain with spinal arthritis Chronic musculoskeletal pain with worsening spinal arthritis, particularly in the neck. Undergoing another ablation for back pain, which has provided relief for approximately six months in the past. Engaged in physical therapy exercises and indoor walking to manage symptoms. Discussed the role of hydration and body mechanics in managing symptoms. - Continue with physical therapy exercises daily. - Proceed with scheduled ablation for back pain.  Type 2 diabetes mellitus No specific discussion regarding changes or issues with diabetes management.  Mixed hyperlipidemia No specific discussion regarding changes or issues with hyperlipidemia management.  Vitamin D  deficiency Vitamin D  levels are within the desired range of 30 to 50 ng/mL.  Recording duration: 30 minutes     Harlene Horton, MD

## 2024-07-12 ENCOUNTER — Ambulatory Visit: Attending: Family Medicine

## 2024-07-12 DIAGNOSIS — R002 Palpitations: Secondary | ICD-10-CM

## 2024-07-12 NOTE — Progress Notes (Unsigned)
 EP to read.

## 2024-07-15 ENCOUNTER — Other Ambulatory Visit (HOSPITAL_COMMUNITY): Payer: Self-pay

## 2024-07-27 ENCOUNTER — Other Ambulatory Visit: Payer: Self-pay | Admitting: Nurse Practitioner

## 2024-07-27 ENCOUNTER — Ambulatory Visit (INDEPENDENT_AMBULATORY_CARE_PROVIDER_SITE_OTHER): Admitting: Nurse Practitioner

## 2024-07-27 ENCOUNTER — Encounter: Payer: Self-pay | Admitting: Nurse Practitioner

## 2024-07-27 ENCOUNTER — Ambulatory Visit (INDEPENDENT_AMBULATORY_CARE_PROVIDER_SITE_OTHER)

## 2024-07-27 VITALS — BP 118/76 | HR 69 | Ht 65.0 in | Wt 200.0 lb

## 2024-07-27 DIAGNOSIS — N950 Postmenopausal bleeding: Secondary | ICD-10-CM | POA: Diagnosis not present

## 2024-07-27 DIAGNOSIS — N951 Menopausal and female climacteric states: Secondary | ICD-10-CM | POA: Diagnosis not present

## 2024-07-27 NOTE — Progress Notes (Signed)
   Acute Office Visit  Subjective:    Patient ID: Amanda James, female    DOB: 1971/08/26, 53 y.o.   MRN: [id number]   HPI 53 y.o. presents today for ultrasound. Seen 06/18/24 as new patient for postmenopausal bleeding. Had period-like bleeding about 6 months ago that lasted 4 days. LMP prior to this was June 2022. She is wondering if she is a candidate for HRT for management of menopausal symptoms. Symptoms are mild. H/O PFO, not repaired.   Patient's last menstrual period was 05/18/2021 (exact date).    Review of Systems  Constitutional: Negative.   Endocrine: Positive for heat intolerance.  Genitourinary: Negative.   Psychiatric/Behavioral:  Positive for sleep disturbance.        Objective:    Physical Exam Constitutional:      Appearance: Normal appearance.   GU: Not indicated  BP 118/76 (BP Location: Left Arm, Patient Position: Sitting, Cuff Size: Normal)   Pulse 69   Ht 5' 5 (1.651 m)   Wt 200 lb (90.7 kg)   LMP 05/18/2021 (Exact Date)   SpO2 97%   BMI 33.28 kg/m  Wt Readings from Last 3 Encounters:  07/27/24 200 lb (90.7 kg)  07/08/24 200 lb 6.4 oz (90.9 kg)  06/18/24 202 lb (91.6 kg)        Assessment & Plan:   Problem List Items Addressed This Visit   None Visit Diagnoses       PMB (postmenopausal bleeding)    -  Primary     Menopausal symptoms          Both transabdominal and transvaginal techniques were necessary to evaluate anatomy. Anteverted uterus, normal size and shape, no myometrial masses.  Thin, symmetrical endometrium - 3.38 mm.  No masses or thickening seen, avascular.  Both ovaries atrophic in size.  No adnexal masses, no free fluid.  Plan: Normal ultrasound reviewed. Will monitor. Discussed HRT and increased risk for stroke with PFO. Not recommended.    Annabella DELENA Shutter DNP, 11:54 AM 07/27/2024

## 2024-07-30 ENCOUNTER — Ambulatory Visit: Admitting: Nurse Practitioner

## 2024-08-06 DIAGNOSIS — R002 Palpitations: Secondary | ICD-10-CM | POA: Diagnosis not present

## 2024-08-11 DIAGNOSIS — R002 Palpitations: Secondary | ICD-10-CM

## 2024-08-12 ENCOUNTER — Other Ambulatory Visit (HOSPITAL_COMMUNITY): Payer: Self-pay

## 2024-08-19 ENCOUNTER — Encounter: Payer: Self-pay | Admitting: Obstetrics and Gynecology

## 2024-08-20 ENCOUNTER — Ambulatory Visit: Admitting: Obstetrics and Gynecology

## 2024-09-12 ENCOUNTER — Other Ambulatory Visit (HOSPITAL_COMMUNITY): Payer: Self-pay

## 2024-09-22 ENCOUNTER — Other Ambulatory Visit: Payer: Self-pay

## 2024-09-22 ENCOUNTER — Other Ambulatory Visit: Payer: Self-pay | Admitting: Family Medicine

## 2024-09-22 ENCOUNTER — Other Ambulatory Visit (HOSPITAL_COMMUNITY): Payer: Self-pay

## 2024-09-22 DIAGNOSIS — N952 Postmenopausal atrophic vaginitis: Secondary | ICD-10-CM

## 2024-09-22 MED ORDER — PREMARIN 0.625 MG/GM VA CREA
1.0000 | TOPICAL_CREAM | VAGINAL | 1 refills | Status: AC
  Filled 2024-09-22: qty 30, 53d supply, fill #0
  Filled 2024-12-07: qty 30, 53d supply, fill #1

## 2024-09-30 ENCOUNTER — Other Ambulatory Visit: Payer: Self-pay | Admitting: Medical Genetics

## 2024-09-30 DIAGNOSIS — Z006 Encounter for examination for normal comparison and control in clinical research program: Secondary | ICD-10-CM

## 2024-09-30 NOTE — Progress Notes (Unsigned)
 LILLETTE Ileana Collet, PhD, LAT, ATC acting as a scribe for Artist Lloyd, MD.  Amanda James is a 53 y.o. female who presents to [facility] today for worsening neck pain. After completed a course of PT for her neck, pt sent a MyChart message and cspine MRI was ordered.   Today, pt reports after PT her neck was doing well. She's been working on [employer]. Neck pain has progressively worsened over the last 2 months. The treatments she was previously doing are no longer helping. Pain is located on the R-side of her neck. No radicular pain. No n/t  Dx imaging: 06/18/24 C-spine MRI  01/23/24 C-spine XR  Pertinent review of systems: No fevers or chills  Relevant historical information: Coronary artery disease diabetes controlled   Exam:  BP 104/78   Pulse 69   Ht 5' 5 (1.651 m)   Wt 200 lb (90.7 kg)   LMP 05/18/2021 (Exact Date)   SpO2 97%   BMI 33.28 kg/m  General: Well Developed, well nourished, and in no acute distress.   MSK: C-spine normal-appearing nontender palpation midline.  Tender palpation right cervical paraspinal musculature.  Decreased cervical motion upper extremity strength is intact.    Lab and Radiology Results  EXAM: MRI CERVICAL SPINE WITHOUT CONTRAST   TECHNIQUE: Multiplanar, multisequence MR imaging of the cervical spine was performed. No intravenous contrast was administered.   COMPARISON:  MRI from 11/29/2020.   FINDINGS: Alignment: Mild levoscoliosis with straightening of the normal cervical lordosis. 2 mm facet mediated anterolisthesis of C4 on C5.   Vertebrae: Vertebral body height maintained without acute or chronic fracture. Bone marrow signal intensity within normal limits. No worrisome osseous lesions. Reactive marrow edema present about the right C4-5 and C7-T1 facets due to facet arthritis (series 104, image 4).   Cord: Normal signal morphology.   Posterior Fossa, vertebral arteries, paraspinal  tissues: Unremarkable.   Disc levels:   C2-C3: Small right paracentral disc protrusion minimally flattens the ventral thecal sac. Moderate right mild left facet hypertrophy. No stenosis.   C3-C4: Mild disc bulge with uncovertebral spurring. Moderate right facet hypertrophy. No spinal stenosis. Mild right C4 foraminal narrowing. Left neural foramen widely patent.   C4-C5: Broad-based central to left paracentral disc protrusion indents the ventral thecal sac. Moderate right facet arthrosis with reactive marrow edema. No significant spinal stenosis. Foramina remain patent.   C5-C6: Moderate degenerate intervertebral disc space narrowing with circumferential disc osteophyte complex. Flattening of the ventral thecal sac without significant spinal stenosis. Foramina remain patent.   C6-C7: Tiny left paracentral disc protrusion mildly flattens the left ventral thecal sac. No spinal stenosis. Foramina remain patent.   C7-T1: Negative interspace. Moderate right facet hypertrophy with reactive marrow edema. No spinal stenosis. Foramina remain patent.   IMPRESSION: 1. 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 Finding could serve as a source for neck pain and referred symptoms. 2. Underlying multilevel cervical spondylosis as above, most pronounced at C5-6. No significant stenosis or overt neural impingement.     Electronically Signed   By: Morene Hoard M.D.   On: 06/18/2024 19:03.xray      Assessment and Plan: 53 y.o. female with chronic neck pain occurring with chronic low back pain.  The neck pain is primarily facet generated.  She is already had a good trial of physical therapy which unfortunately did not work well enough.  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  At  this point next best option is facet medial branch block and ablation right C4-5 and C7-T1.  Will refer to pain management to proceed  with cervical facet interventions.  Additionally she notes chronic low back pain also facet mediated.  Transition care to pain management for evaluation and treatment for facet procedures lumbar spine as well.   PDMP not reviewed this encounter. Orders Placed This Encounter  Procedures   Ambulatory referral to Pain Clinic    Referral Priority:   Routine    Referral Type:   Consultation    Referral Reason:   Specialty Services Required    Requested Specialty:   Pain Medicine    Number of Visits Requested:   1   No orders of the defined types were placed in this encounter.    Discussed warning signs or symptoms. Please see discharge instructions. Patient expresses understanding.   The above documentation has been reviewed and is accurate and complete Artist Lloyd, M.D.

## 2024-10-01 ENCOUNTER — Ambulatory Visit (INDEPENDENT_AMBULATORY_CARE_PROVIDER_SITE_OTHER): Admitting: Family Medicine

## 2024-10-01 VITALS — BP 104/78 | HR 69 | Ht 65.0 in | Wt 200.0 lb

## 2024-10-01 DIAGNOSIS — G8929 Other chronic pain: Secondary | ICD-10-CM | POA: Diagnosis not present

## 2024-10-01 DIAGNOSIS — M542 Cervicalgia: Secondary | ICD-10-CM | POA: Diagnosis not present

## 2024-10-01 NOTE — Patient Instructions (Addendum)
 Thank you for coming in today.   I've referred you to Dr. Eldonna.  Let us  know if you don't hear from them in one week.   Recommend Prism  glasses

## 2024-10-08 ENCOUNTER — Other Ambulatory Visit: Payer: Self-pay | Admitting: Family Medicine

## 2024-10-08 ENCOUNTER — Other Ambulatory Visit (HOSPITAL_BASED_OUTPATIENT_CLINIC_OR_DEPARTMENT_OTHER): Payer: Self-pay

## 2024-10-08 ENCOUNTER — Other Ambulatory Visit (HOSPITAL_COMMUNITY): Payer: Self-pay

## 2024-10-08 ENCOUNTER — Other Ambulatory Visit: Payer: Self-pay

## 2024-10-08 MED ORDER — BUPROPION HCL ER (SR) 100 MG PO TB12
100.0000 mg | ORAL_TABLET | Freq: Two times a day (BID) | ORAL | 1 refills | Status: AC
  Filled 2024-10-08: qty 180, 90d supply, fill #0
  Filled 2025-01-02: qty 180, 90d supply, fill #1

## 2024-10-08 MED ORDER — FLUZONE 0.5 ML IM SUSY
0.5000 mL | PREFILLED_SYRINGE | Freq: Once | INTRAMUSCULAR | 0 refills | Status: AC
  Filled 2024-10-08: qty 0.5, 1d supply, fill #0

## 2024-10-08 MED ORDER — COMIRNATY 30 MCG/0.3ML IM SUSY
0.3000 mL | PREFILLED_SYRINGE | Freq: Once | INTRAMUSCULAR | 0 refills | Status: AC
  Filled 2024-10-08: qty 0.3, 1d supply, fill #0

## 2024-10-08 MED ORDER — ESCITALOPRAM OXALATE 20 MG PO TABS
20.0000 mg | ORAL_TABLET | Freq: Every morning | ORAL | 1 refills | Status: AC
  Filled 2024-10-08: qty 90, 90d supply, fill #0
  Filled 2025-01-02: qty 90, 90d supply, fill #1

## 2024-10-08 MED ORDER — ATORVASTATIN CALCIUM 20 MG PO TABS
20.0000 mg | ORAL_TABLET | Freq: Every day | ORAL | 1 refills | Status: AC
  Filled 2024-10-08: qty 90, 90d supply, fill #0
  Filled 2025-01-02: qty 90, 90d supply, fill #1

## 2024-10-15 ENCOUNTER — Encounter: Payer: Self-pay | Admitting: Family Medicine

## 2024-10-15 DIAGNOSIS — M47816 Spondylosis without myelopathy or radiculopathy, lumbar region: Secondary | ICD-10-CM | POA: Diagnosis not present

## 2024-10-15 DIAGNOSIS — M542 Cervicalgia: Secondary | ICD-10-CM | POA: Diagnosis not present

## 2024-10-18 ENCOUNTER — Telehealth: Payer: Self-pay | Admitting: Family Medicine

## 2024-10-18 NOTE — Telephone Encounter (Signed)
 Amanda James with [facility] called to state that patient came in Friday for a back follow up there and the patient let them know that she was having some neck pain as well and she states that she had a work up with Dr. Joane here. They need some office notes regarding injections for her neck or any imaging or any physical therapy to give her injection therapy for her neck as well. She provided a fax number [phone]. Please advise.

## 2024-10-18 NOTE — Telephone Encounter (Signed)
 Visit note, PT note, and DI printed and placed at the front desk for faxing.

## 2024-11-02 ENCOUNTER — Other Ambulatory Visit: Payer: Self-pay | Admitting: Family

## 2024-11-02 DIAGNOSIS — M47812 Spondylosis without myelopathy or radiculopathy, cervical region: Secondary | ICD-10-CM | POA: Diagnosis not present

## 2024-11-02 DIAGNOSIS — D3131 Benign neoplasm of right choroid: Secondary | ICD-10-CM | POA: Diagnosis not present

## 2024-11-02 DIAGNOSIS — E119 Type 2 diabetes mellitus without complications: Secondary | ICD-10-CM | POA: Diagnosis not present

## 2024-11-02 DIAGNOSIS — G8929 Other chronic pain: Secondary | ICD-10-CM

## 2024-11-02 LAB — OPHTHALMOLOGY REPORT-SCANNED

## 2024-11-07 ENCOUNTER — Other Ambulatory Visit (HOSPITAL_COMMUNITY): Payer: Self-pay

## 2024-11-12 ENCOUNTER — Other Ambulatory Visit (HOSPITAL_COMMUNITY): Payer: Self-pay

## 2024-11-12 MED ORDER — CYCLOBENZAPRINE HCL 10 MG PO TABS
10.0000 mg | ORAL_TABLET | Freq: Three times a day (TID) | ORAL | 3 refills | Status: AC | PRN
  Filled 2024-11-12: qty 30, 10d supply, fill #0
  Filled 2024-12-20: qty 30, 10d supply, fill #1

## 2024-11-13 ENCOUNTER — Other Ambulatory Visit (HOSPITAL_COMMUNITY): Payer: Self-pay

## 2024-11-18 DIAGNOSIS — M47812 Spondylosis without myelopathy or radiculopathy, cervical region: Secondary | ICD-10-CM | POA: Diagnosis not present

## 2024-12-01 ENCOUNTER — Other Ambulatory Visit (HOSPITAL_COMMUNITY): Payer: Self-pay

## 2024-12-01 ENCOUNTER — Telehealth: Admitting: Physician Assistant

## 2024-12-01 DIAGNOSIS — H6502 Acute serous otitis media, left ear: Secondary | ICD-10-CM

## 2024-12-01 MED ORDER — CIPROFLOXACIN-DEXAMETHASONE 0.3-0.1 % OT SUSP
4.0000 [drp] | Freq: Two times a day (BID) | OTIC | 0 refills | Status: AC
  Filled 2024-12-01: qty 7.5, 19d supply, fill #0

## 2024-12-01 MED ORDER — AMOXICILLIN-POT CLAVULANATE 875-125 MG PO TABS
1.0000 | ORAL_TABLET | Freq: Two times a day (BID) | ORAL | 0 refills | Status: AC
  Filled 2024-12-01: qty 20, 10d supply, fill #0

## 2024-12-01 NOTE — Progress Notes (Signed)

## 2024-12-03 ENCOUNTER — Encounter (HOSPITAL_COMMUNITY): Payer: Self-pay

## 2024-12-03 ENCOUNTER — Other Ambulatory Visit (HOSPITAL_COMMUNITY): Payer: Self-pay

## 2024-12-06 ENCOUNTER — Other Ambulatory Visit (HOSPITAL_COMMUNITY): Payer: Self-pay

## 2024-12-07 ENCOUNTER — Other Ambulatory Visit: Payer: Self-pay

## 2024-12-20 ENCOUNTER — Encounter: Payer: Self-pay | Admitting: *Deleted

## 2024-12-20 ENCOUNTER — Other Ambulatory Visit: Payer: Self-pay

## 2024-12-20 ENCOUNTER — Other Ambulatory Visit: Payer: Self-pay | Admitting: Family Medicine

## 2024-12-20 MED ORDER — MOUNJARO 15 MG/0.5ML ~~LOC~~ SOAJ
15.0000 mg | SUBCUTANEOUS | 1 refills | Status: AC
  Filled 2024-12-20: qty 6, 84d supply, fill #0

## 2024-12-21 ENCOUNTER — Other Ambulatory Visit: Payer: Self-pay

## 2024-12-21 ENCOUNTER — Other Ambulatory Visit (HOSPITAL_COMMUNITY): Payer: Self-pay

## 2024-12-30 ENCOUNTER — Other Ambulatory Visit: Payer: Self-pay | Admitting: Family Medicine

## 2024-12-30 MED ORDER — ENALAPRIL MALEATE 2.5 MG PO TABS
2.5000 mg | ORAL_TABLET | Freq: Every day | ORAL | 1 refills | Status: AC
  Filled 2024-12-30: qty 90, 90d supply, fill #0

## 2024-12-31 ENCOUNTER — Other Ambulatory Visit: Payer: Self-pay

## 2024-12-31 ENCOUNTER — Other Ambulatory Visit (HOSPITAL_COMMUNITY): Payer: Self-pay

## 2025-01-02 ENCOUNTER — Other Ambulatory Visit (HOSPITAL_COMMUNITY): Payer: Self-pay

## 2025-01-03 ENCOUNTER — Other Ambulatory Visit (HOSPITAL_COMMUNITY): Payer: Self-pay

## 2025-01-03 ENCOUNTER — Encounter (HOSPITAL_COMMUNITY): Payer: Self-pay

## 2025-01-05 NOTE — Assessment & Plan Note (Addendum)
 Patient encouraged to maintain heart healthy diet, regular exercise, adequate sleep. Consider daily probiotics. Take medications as prescribed MM Colonoscopy 2024 repeat in 2031 Pap 2021

## 2025-01-05 NOTE — Assessment & Plan Note (Signed)
 Supplement and monitor

## 2025-01-05 NOTE — Progress Notes (Unsigned)
 "  Subjective:    Patient ID: Amanda James, female    DOB: November 24, 1971, 54 y.o.   MRN: [id number]  No chief complaint on file.   HPI Discussed the use of AI scribe software for clinical note transcription with the patient, who gave verbal consent to proceed.  History of Present Illness     Past Medical History:  Diagnosis Date   Allergy    Arthritis    back   Cervical cancer screening 05/02/2017   Depression    Depression with anxiety    Diabetes mellitus type 2 in obese    Diabetes mellitus without complication (HCC)    GERD (gastroesophageal reflux disease)    History of chicken pox    Hyperlipidemia    Low back pain    Migraines    Obesity 11/10/2016   PFO (patent foramen ovale)    STD (sexually transmitted disease)    trichomonas treated many yrs ago    Past Surgical History:  Procedure Laterality Date   ABDOMINOPLASTY  2012   AUGMENTATION MAMMAPLASTY  2014   maxilift with implants    COSMETIC SURGERY     INCONTINENCE SURGERY  2016   LAPAROSCOPIC GASTRIC BANDING  2005   esophageal dilation. band still present but released   MASTOPEXY  2012    Family History  Problem Relation Age of Onset   Diabetes Mother    Hypertension Mother    Hyperlipidemia Mother    Heart disease Mother        pace maker   Colon polyps Father    Diabetes Father    Heart disease Father        MI   Hyperlipidemia Father    Hypertension Father    Heart attack Father    Diabetes Sister    Hyperlipidemia Sister    Hypertension Sister    Depression Sister    Early death Sister    Hypertension Maternal Grandmother    Hyperlipidemia Maternal Grandmother    Diabetes Maternal Grandmother    Non-Hodgkin's lymphoma Maternal Grandmother    Arthritis Maternal Grandmother        spinal stenosis   Multiple myeloma Maternal Grandfather    Hyperlipidemia Paternal Grandmother        rhabdo from statins   Kidney disease Paternal  Grandmother    Healthy Son    Esophageal cancer Paternal Uncle     Social History   Socioeconomic History   Marital status: Married    Spouse name: Not on file   Number of children: 1   Years of education: MD   Highest education level: Professional school degree (e.g., MD, DDS, DVM, JD)  Occupational History   Occupation: [employer]  Tobacco Use   Smoking status: Never    Passive exposure: Never   Smokeless tobacco: Never  Vaping Use   Vaping status: Never Used  Substance and Sexual Activity   Alcohol use: Not Currently    Comment: 5 glasses of wine per week   Drug use: No   Sexual activity: Yes    Partners: Male    Birth control/protection: Post-menopausal  Other Topics Concern   Not on file  Social History Narrative   Works With [employer], lives with husband, 3 cats   No major dietary restrictions. Exercise 30 minutes 3 x a week   Seat belts routinely   Right-handed   Caffeine: 1-2 cups of coffee per day   Social Drivers of Health   Tobacco Use: Low  Risk (07/27/2024)   Patient History    Smoking Tobacco Use: Never    Smokeless Tobacco Use: Never    Passive Exposure: Never  Financial Resource Strain: Low Risk (07/08/2024)   Overall Financial Resource Strain (CARDIA)    Difficulty of Paying Living Expenses: Not hard at all  Food Insecurity: No Food Insecurity (07/08/2024)   Epic    Worried About Programme Researcher, Broadcasting/film/video in the Last Year: Never true    Ran Out of Food in the Last Year: Never true  Transportation Needs: No Transportation Needs (07/08/2024)   Epic    Lack of Transportation (Medical): No    Lack of Transportation (Non-Medical): No  Physical Activity: Insufficiently Active (07/08/2024)   Exercise Vital Sign    Days of Exercise per Week: 3 days    Minutes of Exercise per Session: 40 min  Stress: No Stress Concern Present (07/08/2024)   [employer] of Occupational Health - Occupational Stress Questionnaire    Feeling of Stress:  Only a little  Social Connections: Socially Isolated (07/08/2024)   Social Connection and Isolation Panel    Frequency of Communication with Friends and Family: Once a week    Frequency of Social Gatherings with Friends and Family: Once a week    Attends Religious Services: Never    Database Administrator or Organizations: No    Attends Engineer, Structural: Not on file    Marital Status: Married  Catering Manager Violence: Not on file  Depression (PHQ2-9): Low Risk (07/08/2024)   Depression (PHQ2-9)    PHQ-2 Score: 0  Alcohol Screen: Low Risk (07/08/2024)   Alcohol Screen    Last Alcohol Screening Score (AUDIT): 5  Housing: Low Risk (07/08/2024)   Epic    Unable to Pay for Housing in the Last Year: No    Number of Times Moved in the Last Year: 0    Homeless in the Last Year: No  Utilities: Not on file  Health Literacy: Not on file    Outpatient Medications Prior to Visit  Medication Sig Dispense Refill   amoxicillin -clavulanate (AUGMENTIN ) 875-125 MG tablet Take 1 tablet by mouth 2 (two) times daily. 20 tablet 0   atorvastatin  (LIPITOR) 20 MG tablet Take 1 tablet (20 mg total) by mouth daily. 90 tablet 1   betamethasone  dipropionate 0.05 % cream Apply topically 2 (two) times daily. 45 g 0   Blood Glucose Monitoring Suppl (FREESTYLE LITE) w/Device KIT Use to test blood sugar 4 times a day 1 kit 0   buPROPion  ER (WELLBUTRIN  SR) 100 MG 12 hr tablet Take 1 tablet (100 mg total) by mouth 2 (two) times daily. 180 tablet 1   Cetirizine HCl 10 MG CAPS Take 1 capsule by mouth daily.     conjugated estrogens  (PREMARIN ) vaginal cream Place 1 Applicatorful vaginally 2 (two) times a week. 30 g 1   cyclobenzaprine  (FLEXERIL ) 10 MG tablet Take 1 tablet (10 mg total) by mouth 3 (three) times daily as needed for muscle spasms 30 tablet 3   enalapril  (VASOTEC ) 2.5 MG tablet Take 1 tablet (2.5 mg total) by mouth daily. 90 tablet 1   escitalopram  (LEXAPRO ) 20 MG tablet Take  1 tablet (20 mg total) by mouth every morning. 90 tablet 1   Fluticasone Propionate (FLONASE ALLERGY RELIEF NA) Place into the nose daily.     glucose blood (FREESTYLE LITE) test strip Use to test blood sugar 4 times a day 100 each 12   Lancets (FREESTYLE) lancets  Use to test blood sugar 4 times a day 100 each 12   minoxidil (LONITEN) 2.5 MG tablet Take 0.5 tablets (1.25 mg total) by mouth daily. (Patient taking differently: Take 2.5 mg by mouth daily.)     mirabegron  ER (MYRBETRIQ ) 50 MG TB24 tablet Take 1 tablet (50 mg total) by mouth daily. 30 tablet 11   Sod Fluoride -Potassium Nitrate  (PREVIDENT  5000 SENSITIVE) 1.1-5 % GEL Brush on teeth 2 times a day for 2 minutes each time then spit as much as possible then DO NOT swish, eat or drink for 30 minutes 100 mL 12   tirzepatide  (MOUNJARO ) 15 MG/0.5ML Pen Inject 15 mg into the skin once a week. 6 mL 1   Ubrogepant  (UBRELVY ) 100 MG TABS Take 1 tablet by mouth at onset of migraine as needed as directed. May repeat once in 2 hours if headache persists. Maximum 2 tablets per day. 16 tablet 11   Facility-Administered Medications Prior to Visit  Medication Dose Route Frequency Provider Last Rate Last Admin   gadobenate dimeglumine  (MULTIHANCE ) injection 20 mL  20 mL Intravenous Once PRN Ahern, Antonia B, MD       sodium chloride  flush (NS) 0.9 % injection 10 mL  10 mL Intravenous PRN Ahern, Antonia B, MD   20 mL at 10/26/20 1345    Allergies[1]  Review of Systems  Constitutional:  Negative for chills, fever and malaise/fatigue.  HENT:  Negative for congestion and hearing loss.   Eyes:  Negative for discharge.  Respiratory:  Negative for cough, sputum production and shortness of breath.   Cardiovascular:  Negative for chest pain, palpitations and leg swelling.  Gastrointestinal:  Negative for abdominal pain, blood in stool, constipation, diarrhea, heartburn, nausea and vomiting.  Genitourinary:  Negative for dysuria, frequency, hematuria  and urgency.  Musculoskeletal:  Negative for back pain, falls and myalgias.  Skin:  Negative for rash.  Neurological:  Negative for dizziness, sensory change, loss of consciousness, weakness and headaches.  Endo/Heme/Allergies:  Negative for environmental allergies. Does not bruise/bleed easily.  Psychiatric/Behavioral:  Negative for depression and suicidal ideas. The patient is not nervous/anxious and does not have insomnia.        Objective:    Physical Exam Constitutional:      General: She is not in acute distress.    Appearance: Normal appearance. She is not diaphoretic.  HENT:     Head: Normocephalic and atraumatic.     Right Ear: Tympanic membrane, ear canal and external ear normal.     Left Ear: Tympanic membrane, ear canal and external ear normal.     Nose: Nose normal.     Mouth/Throat:     Mouth: Mucous membranes are moist.     Pharynx: Oropharynx is clear. No oropharyngeal exudate.  Eyes:     General: No scleral icterus.       Right eye: No discharge.        Left eye: No discharge.     Conjunctiva/sclera: Conjunctivae normal.     Pupils: Pupils are equal, round, and reactive to light.  Neck:     Thyroid : No thyromegaly.  Cardiovascular:     Rate and Rhythm: Normal rate and regular rhythm.     Heart sounds: Normal heart sounds. No murmur heard. Pulmonary:     Effort: Pulmonary effort is normal. No respiratory distress.     Breath sounds: Normal breath sounds. No wheezing or rales.  Abdominal:     General: Bowel sounds are normal. There is no  distension.     Palpations: Abdomen is soft. There is no mass.     Tenderness: There is no abdominal tenderness.  Musculoskeletal:        General: No tenderness. Normal range of motion.     Cervical back: Normal range of motion and neck supple.  Lymphadenopathy:     Cervical: No cervical adenopathy.  Skin:    General: Skin is warm and dry.     Findings: No rash.  Neurological:     General: No focal deficit present.      Mental Status: She is alert and oriented to person, place, and time.     Cranial Nerves: No cranial nerve deficit.     Coordination: Coordination normal.     Deep Tendon Reflexes: Reflexes are normal and symmetric. Reflexes normal.  Psychiatric:        Mood and Affect: Mood normal.        Behavior: Behavior normal.        Thought Content: Thought content normal.        Judgment: Judgment normal.    LMP 05/18/2021  Wt Readings from Last 3 Encounters:  10/01/24 200 lb (90.7 kg)  07/27/24 200 lb (90.7 kg)  07/08/24 200 lb 6.4 oz (90.9 kg)    Diabetic Foot Exam - Simple   No data filed    Lab Results  Component Value Date   WBC 6.6 07/02/2024   HGB 13.9 07/02/2024   HCT 40.9 07/02/2024   PLT 271.0 07/02/2024   GLUCOSE 92 07/02/2024   CHOL 147 07/02/2024   TRIG 80.0 07/02/2024   HDL 66.00 07/02/2024   LDLCALC 65 07/02/2024   ALT 38 (H) 07/02/2024   AST 26 07/02/2024   NA 137 07/02/2024   K 4.1 07/02/2024   CL 103 07/02/2024   CREATININE 0.83 07/02/2024   BUN 18 07/02/2024   CO2 25 07/02/2024   TSH 3.47 07/02/2024   INR 1.1 06/01/2022   HGBA1C 5.4 07/02/2024   MICROALBUR <0.7 07/08/2024    Lab Results  Component Value Date   TSH 3.47 07/02/2024   Lab Results  Component Value Date   WBC 6.6 07/02/2024   HGB 13.9 07/02/2024   HCT 40.9 07/02/2024   MCV 95.1 07/02/2024   PLT 271.0 07/02/2024   Lab Results  Component Value Date   NA 137 07/02/2024   K 4.1 07/02/2024   CO2 25 07/02/2024   GLUCOSE 92 07/02/2024   BUN 18 07/02/2024   CREATININE 0.83 07/02/2024   BILITOT 1.1 07/02/2024   ALKPHOS 68 07/02/2024   AST 26 07/02/2024   ALT 38 (H) 07/02/2024   PROT 7.5 07/02/2024   ALBUMIN 4.6 07/02/2024   CALCIUM  9.3 07/02/2024   ANIONGAP 10 06/01/2022   EGFR 83 08/29/2023   GFR 80.20 07/02/2024   Lab Results  Component Value Date   CHOL 147 07/02/2024   Lab Results  Component Value Date   HDL 66.00 07/02/2024   Lab Results  Component Value Date    LDLCALC 65 07/02/2024   Lab Results  Component Value Date   TRIG 80.0 07/02/2024   Lab Results  Component Value Date   CHOLHDL 2 07/02/2024   Lab Results  Component Value Date   HGBA1C 5.4 07/02/2024       Assessment & Plan:  Vitamin D  deficiency Assessment & Plan: Supplement and monitor    Type 2 diabetes mellitus in patient with obesity (HCC) Assessment & Plan: hgba1c acceptable, minimize simple carbs. Increase  exercise as tolerated. Continue current meds    Preventative health care Assessment & Plan: Patient encouraged to maintain heart healthy diet, regular exercise, adequate sleep. Consider daily probiotics. Take medications as prescribed MM Colonoscopy 2024 repeat in 2031 Pap 2021      Assessment and Plan Assessment & Plan      Harlene Horton, MD     [1] Allergies Allergen Reactions   Erythromycin Diarrhea  "

## 2025-01-05 NOTE — Assessment & Plan Note (Signed)
 hgba1c acceptable, minimize simple carbs. Increase exercise as tolerated. Continue current meds

## 2025-01-06 ENCOUNTER — Encounter: Payer: Self-pay | Admitting: Family Medicine

## 2025-01-06 ENCOUNTER — Encounter: Payer: 59 | Admitting: Family Medicine

## 2025-01-06 DIAGNOSIS — Z0000 Encounter for general adult medical examination without abnormal findings: Secondary | ICD-10-CM

## 2025-01-06 DIAGNOSIS — E669 Obesity, unspecified: Secondary | ICD-10-CM

## 2025-01-06 DIAGNOSIS — E559 Vitamin D deficiency, unspecified: Secondary | ICD-10-CM

## 2025-05-30 ENCOUNTER — Ambulatory Visit: Admitting: Family Medicine
